# Patient Record
Sex: Female | Born: 1955 | Race: White | Hispanic: No | Marital: Married | Smoking: Never smoker
Health system: Southern US, Community
[De-identification: ages and names within clinical notes are randomized; demographics above are authoritative.]

## PROBLEM LIST (undated history)

## (undated) DIAGNOSIS — T7840XA Allergy, unspecified, initial encounter: Secondary | ICD-10-CM

## (undated) DIAGNOSIS — C801 Malignant (primary) neoplasm, unspecified: Secondary | ICD-10-CM

## (undated) DIAGNOSIS — Z5189 Encounter for other specified aftercare: Secondary | ICD-10-CM

## (undated) DIAGNOSIS — F319 Bipolar disorder, unspecified: Secondary | ICD-10-CM

## (undated) DIAGNOSIS — G2401 Drug induced subacute dyskinesia: Secondary | ICD-10-CM

## (undated) DIAGNOSIS — F329 Major depressive disorder, single episode, unspecified: Secondary | ICD-10-CM

## (undated) DIAGNOSIS — K219 Gastro-esophageal reflux disease without esophagitis: Secondary | ICD-10-CM

## (undated) DIAGNOSIS — K760 Fatty (change of) liver, not elsewhere classified: Secondary | ICD-10-CM

## (undated) DIAGNOSIS — I10 Essential (primary) hypertension: Secondary | ICD-10-CM

## (undated) DIAGNOSIS — E785 Hyperlipidemia, unspecified: Secondary | ICD-10-CM

## (undated) DIAGNOSIS — D126 Benign neoplasm of colon, unspecified: Secondary | ICD-10-CM

## (undated) DIAGNOSIS — R945 Abnormal results of liver function studies: Secondary | ICD-10-CM

## (undated) DIAGNOSIS — R7989 Other specified abnormal findings of blood chemistry: Secondary | ICD-10-CM

## (undated) DIAGNOSIS — R569 Unspecified convulsions: Secondary | ICD-10-CM

## (undated) DIAGNOSIS — F32A Depression, unspecified: Secondary | ICD-10-CM

## (undated) DIAGNOSIS — F419 Anxiety disorder, unspecified: Secondary | ICD-10-CM

## (undated) HISTORY — DX: Drug induced subacute dyskinesia: G24.01

## (undated) HISTORY — DX: Depression, unspecified: F32.A

## (undated) HISTORY — PX: EYE SURGERY: SHX253

## (undated) HISTORY — DX: Other specified abnormal findings of blood chemistry: R79.89

## (undated) HISTORY — DX: Major depressive disorder, single episode, unspecified: F32.9

## (undated) HISTORY — PX: KNEE ARTHROSCOPY WITH MENISCAL REPAIR: SHX5653

## (undated) HISTORY — PX: APPENDECTOMY: SHX54

## (undated) HISTORY — DX: Encounter for other specified aftercare: Z51.89

## (undated) HISTORY — DX: Unspecified convulsions: R56.9

## (undated) HISTORY — DX: Essential (primary) hypertension: I10

## (undated) HISTORY — DX: Malignant (primary) neoplasm, unspecified: C80.1

## (undated) HISTORY — DX: Fatty (change of) liver, not elsewhere classified: K76.0

## (undated) HISTORY — DX: Benign neoplasm of colon, unspecified: D12.6

## (undated) HISTORY — DX: Gastro-esophageal reflux disease without esophagitis: K21.9

## (undated) HISTORY — PX: DILATION AND CURETTAGE OF UTERUS: SHX78

## (undated) HISTORY — PX: NECK SURGERY: SHX720

## (undated) HISTORY — DX: Hyperlipidemia, unspecified: E78.5

## (undated) HISTORY — DX: Bipolar disorder, unspecified: F31.9

## (undated) HISTORY — PX: SPINE SURGERY: SHX786

## (undated) HISTORY — DX: Anxiety disorder, unspecified: F41.9

## (undated) HISTORY — DX: Allergy, unspecified, initial encounter: T78.40XA

## (undated) HISTORY — DX: Abnormal results of liver function studies: R94.5

---

## 1975-08-24 DIAGNOSIS — R569 Unspecified convulsions: Secondary | ICD-10-CM

## 1975-08-24 HISTORY — DX: Unspecified convulsions: R56.9

## 1997-12-27 ENCOUNTER — Other Ambulatory Visit: Admission: RE | Admit: 1997-12-27 | Discharge: 1997-12-27 | Payer: Self-pay | Admitting: Obstetrics and Gynecology

## 1998-10-29 ENCOUNTER — Encounter: Payer: Self-pay | Admitting: Obstetrics and Gynecology

## 1998-10-29 ENCOUNTER — Ambulatory Visit (HOSPITAL_COMMUNITY): Admission: RE | Admit: 1998-10-29 | Discharge: 1998-10-29 | Payer: Self-pay | Admitting: Obstetrics and Gynecology

## 1998-10-30 ENCOUNTER — Inpatient Hospital Stay (HOSPITAL_COMMUNITY): Admission: EM | Admit: 1998-10-30 | Discharge: 1998-10-31 | Payer: Self-pay | Admitting: Emergency Medicine

## 1998-11-10 ENCOUNTER — Encounter (HOSPITAL_COMMUNITY): Admission: RE | Admit: 1998-11-10 | Discharge: 1999-02-08 | Payer: Self-pay

## 1999-03-13 ENCOUNTER — Other Ambulatory Visit: Admission: RE | Admit: 1999-03-13 | Discharge: 1999-03-13 | Payer: Self-pay | Admitting: Obstetrics and Gynecology

## 1999-11-02 ENCOUNTER — Inpatient Hospital Stay (HOSPITAL_COMMUNITY): Admission: AD | Admit: 1999-11-02 | Discharge: 1999-11-09 | Payer: Self-pay

## 2000-04-28 ENCOUNTER — Other Ambulatory Visit: Admission: RE | Admit: 2000-04-28 | Discharge: 2000-04-28 | Payer: Self-pay | Admitting: Obstetrics and Gynecology

## 2000-09-29 ENCOUNTER — Inpatient Hospital Stay (HOSPITAL_COMMUNITY): Admission: EM | Admit: 2000-09-29 | Discharge: 2000-10-03 | Payer: Self-pay | Admitting: *Deleted

## 2001-09-21 ENCOUNTER — Ambulatory Visit (HOSPITAL_COMMUNITY): Admission: RE | Admit: 2001-09-21 | Discharge: 2001-09-21 | Payer: Self-pay | Admitting: Obstetrics and Gynecology

## 2001-09-21 ENCOUNTER — Encounter: Payer: Self-pay | Admitting: Obstetrics and Gynecology

## 2002-05-08 ENCOUNTER — Encounter: Payer: Self-pay | Admitting: Obstetrics and Gynecology

## 2002-05-08 ENCOUNTER — Encounter: Admission: RE | Admit: 2002-05-08 | Discharge: 2002-05-08 | Payer: Self-pay | Admitting: Obstetrics and Gynecology

## 2002-07-06 ENCOUNTER — Encounter: Payer: Self-pay | Admitting: Obstetrics and Gynecology

## 2002-07-06 ENCOUNTER — Encounter: Admission: RE | Admit: 2002-07-06 | Discharge: 2002-07-06 | Payer: Self-pay | Admitting: Obstetrics and Gynecology

## 2002-08-20 ENCOUNTER — Ambulatory Visit (HOSPITAL_COMMUNITY): Admission: RE | Admit: 2002-08-20 | Discharge: 2002-08-20 | Payer: Self-pay | Admitting: Obstetrics and Gynecology

## 2002-08-20 ENCOUNTER — Encounter: Payer: Self-pay | Admitting: Obstetrics and Gynecology

## 2002-10-15 ENCOUNTER — Inpatient Hospital Stay (HOSPITAL_COMMUNITY): Admission: EM | Admit: 2002-10-15 | Discharge: 2002-10-19 | Payer: Self-pay | Admitting: Psychiatry

## 2003-09-02 ENCOUNTER — Other Ambulatory Visit: Admission: RE | Admit: 2003-09-02 | Discharge: 2003-09-02 | Payer: Self-pay | Admitting: Obstetrics and Gynecology

## 2003-09-26 ENCOUNTER — Ambulatory Visit (HOSPITAL_COMMUNITY): Admission: RE | Admit: 2003-09-26 | Discharge: 2003-09-26 | Payer: Self-pay | Admitting: Obstetrics and Gynecology

## 2003-12-25 ENCOUNTER — Inpatient Hospital Stay (HOSPITAL_COMMUNITY): Admission: RE | Admit: 2003-12-25 | Discharge: 2004-01-02 | Payer: Self-pay | Admitting: Psychiatry

## 2005-08-02 ENCOUNTER — Inpatient Hospital Stay (HOSPITAL_COMMUNITY): Admission: RE | Admit: 2005-08-02 | Discharge: 2005-08-06 | Payer: Self-pay | Admitting: Psychiatry

## 2005-08-03 ENCOUNTER — Ambulatory Visit: Payer: Self-pay | Admitting: Psychiatry

## 2005-09-09 ENCOUNTER — Other Ambulatory Visit: Admission: RE | Admit: 2005-09-09 | Discharge: 2005-09-09 | Payer: Self-pay | Admitting: Obstetrics and Gynecology

## 2005-10-14 ENCOUNTER — Ambulatory Visit (HOSPITAL_COMMUNITY): Admission: RE | Admit: 2005-10-14 | Discharge: 2005-10-14 | Payer: Self-pay | Admitting: Obstetrics and Gynecology

## 2007-01-22 ENCOUNTER — Emergency Department (HOSPITAL_COMMUNITY): Admission: EM | Admit: 2007-01-22 | Discharge: 2007-01-22 | Payer: Self-pay | Admitting: Family Medicine

## 2007-04-27 ENCOUNTER — Emergency Department (HOSPITAL_COMMUNITY): Admission: EM | Admit: 2007-04-27 | Discharge: 2007-04-28 | Payer: Self-pay | Admitting: Emergency Medicine

## 2007-05-25 ENCOUNTER — Ambulatory Visit: Payer: Self-pay | Admitting: Internal Medicine

## 2007-05-26 ENCOUNTER — Other Ambulatory Visit: Admission: RE | Admit: 2007-05-26 | Discharge: 2007-05-26 | Payer: Self-pay | Admitting: Obstetrics and Gynecology

## 2007-06-06 ENCOUNTER — Encounter: Payer: Self-pay | Admitting: Internal Medicine

## 2007-06-06 ENCOUNTER — Ambulatory Visit: Payer: Self-pay | Admitting: Internal Medicine

## 2008-09-18 ENCOUNTER — Ambulatory Visit (HOSPITAL_COMMUNITY): Admission: RE | Admit: 2008-09-18 | Discharge: 2008-09-18 | Payer: Self-pay | Admitting: Obstetrics and Gynecology

## 2008-09-25 ENCOUNTER — Encounter: Admission: RE | Admit: 2008-09-25 | Discharge: 2008-09-25 | Payer: Self-pay | Admitting: Obstetrics and Gynecology

## 2009-01-10 ENCOUNTER — Emergency Department (HOSPITAL_COMMUNITY): Admission: EM | Admit: 2009-01-10 | Discharge: 2009-01-10 | Payer: Self-pay | Admitting: Family Medicine

## 2009-03-27 ENCOUNTER — Encounter: Admission: RE | Admit: 2009-03-27 | Discharge: 2009-03-27 | Payer: Self-pay | Admitting: Obstetrics and Gynecology

## 2009-09-14 ENCOUNTER — Emergency Department (HOSPITAL_COMMUNITY): Admission: EM | Admit: 2009-09-14 | Discharge: 2009-09-14 | Payer: Self-pay | Admitting: Emergency Medicine

## 2010-04-08 ENCOUNTER — Other Ambulatory Visit: Admission: RE | Admit: 2010-04-08 | Discharge: 2010-04-08 | Payer: Self-pay | Admitting: Obstetrics and Gynecology
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
## 2011-01-08 NOTE — H&P (Signed)
NAMEAUDRY, Theresa [name] NO.:  [id number]   MEDICAL RECORD NO.:  [id number]                   PATIENT TYPE:  IPS   LOCATION:  0304                                 FACILITY:  [facility]   PHYSICIAN:  Jeanice Lim, M.D.              DATE OF BIRTH:  1956/03/31   DATE OF ADMISSION:  12/25/2003  DATE OF DISCHARGE:                         PSYCHIATRIC ADMISSION ASSESSMENT   IDENTIFYING INFORMATION:  A 55 year old married white female, voluntarily  admitted on Dec 25, 2003.   HISTORY OF PRESENT ILLNESS:  The patient presents with a history of self  injury feelings.  The patient knew at some point in time that she would  hurt herself.  She reports that she has had a new change of medicines and  has been experiencing mood swings, with suicidal ideation.  She states her  sleep has been varied.  Her appetite has been satisfactory.  The patient  thought that she was just getting so tired that she just stated that she  would hurt herself.   PAST PSYCHIATRIC HISTORY:  Third hospitalization.  The patient was here  approximately 1 year ago where she also wanted to stop any self injury  happening.  She has a history of cutting and burning herself.  Last time was  in December of 2004.  She is an outpatient with Dr. Nolen Mu, has a  therapist [facility].   SOCIAL HISTORY:  She is a 55 year old married white female with 2 children.  Lives with her husband.  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   FAMILY HISTORY:  Unclear.   ALCOHOL DRUG HISTORY:  Nonsmoker, occasional alcohol use, does not think it  is a problem.  Denies any drug use.   PAST MEDICAL HISTORY:  Primary care Ashleyanne Hemmingway is [facility].  Medical problems are epilepsy.  She reports a seizure from years ago.   MEDICATIONS:  Has been on Depakote ER 750 mg at bedtime, Zyprexa 15 mg  q.h.s. and Xanax XR 1 mg in the morning.   DRUG ALLERGIES:  Rash and renal problems.   PHYSICAL  EXAMINATION:  Performed.  Vital signs 97.5, 70 heart rate, 18  respirations, blood pressure is 136/89, approximately 158 pounds, about 5  feet 5 inches.  This is a well-nourished female, in no acute distress.  Nonfocal neurological findings.   LABORATORY DATA:  CBC is within normal limits.  CMET is within normal  limits.  TSH is 2.154.  Valporic acid level is 64.4.   ADMISSION DIAGNOSES:   AXIS I:  1. Bipolar disorder.  2. Post-traumatic stress disorder.  3. Dissociative identity disorder per patient.   AXIS II:  Deferred.   AXIS III:  Epilepsy.   AXIS IV:  Other psychosocial problems.   AXIS V:  Current is 30, past year 61.   PLAN:  Admission for thoughts of self harm.  Contract for safety, check  every 15 minutes.  Stabilize mood and thinking.  We will resume her  medications, will check Depakote levels.  The patient to follow up with Dr.  Nolen Mu,  will consider family session with her husband.  The patient is to  attend groups and continue to be medication compliant.   TENTATIVE LENGTH OF CARE:  3-5 days.     Landry Corporal, N.P.                       Jeanice Lim, M.D.    JO/MEDQ  D:  12/28/2003  T:  12/28/2003  Job:  [id number]

## 2011-01-08 NOTE — Discharge Summary (Signed)
NAMEJACKQULYN, MENDEL NO.:  [id number]   MEDICAL RECORD NO.:  [id number]                   PATIENT TYPE:  IPS   LOCATION:  0304                                 FACILITY:  [facility]   PHYSICIAN:  Jeanice Lim, M.D.              DATE OF BIRTH:  09-12-1955   DATE OF ADMISSION:  12/25/2003  DATE OF DISCHARGE:  01/02/2004                                 DISCHARGE SUMMARY   IDENTIFYING DATA:  This is a 55 year old married Caucasian female  voluntarily admitted.  Presenting with a history of ruminating, feeling  unable to safely control urge to hurt self.  The patient had a history of  mood swings.  Had been on Celexa and Zyprexa and primarily focused on fear  that she would actually cut herself seriously.  Third hospitalization to  [facility].  Here one year ago.  History of cutting.  Had been  outpatient with Dr. Nolen Mu and Marissa Calamity, therapist.   MEDICAL PROBLEMS:  History of epilepsy.  Last seizure was years ago.   MEDICATIONS:  Depakote ER 750 mg q.h.s., Zyprexa 15 mg q.h.s. and Xanax 1 mg  XR q.a.m.   ALLERGIES:  LAMICTAL (caused rash and questionable renal problems).   PHYSICAL EXAMINATION:  Physical examination and neurological exam  essentially within normal limits.   LABORATORY DATA:  Within normal limits.   MENTAL STATUS EXAM:  Alert, middle-aged.  Cooperative.  Fair eye contact.  Speech clear.  Mood tired.  No suicidal and homicidal thoughts but thoughts  and urges to hurt self in some manner, including cutting.  Thought processes  goal directed.  No psychotic symptoms.  Hears her own voice.  Cognitively  intact.  Judgment and insight are fair.   ADMISSION DIAGNOSES:   AXIS I:  1. Bipolar disorder.  2. History of post-traumatic stress disorder.  3. Possible dissociative identity disorder.   AXIS II:  Deferred.   AXIS III:  Epilepsy.   AXIS IV:  Moderate (problems with primary support group, other psychosocial  issues).   AXIS V:  30/65.   HOSPITAL COURSE:  The patient was admitted and ordered routine p.r.n.  medications and underwent further monitoring.  Was encouraged to participate  in individual, group and milieu therapy.  Was monitored medically.  Dr.  Nolen Mu was contacted regarding treatment recommendations and the patient  was placed on safety checks.  She was initially labile with mood swings  prior to admission.  Felt strong urge to kill or cut herself with a positive  history of cutting.  Apparently Celexa may have increased mood swings and  she had responded to Zyprexa.  Depakote level was checked and Xanax was  optimized for acute anxiety.  The patient reported improved sleep and  positive response to medication changes, which he tolerated well.  Mood  became more stable.  Affect brighter.  More hopeful and gradual resolution  of  dangerous ideation, specifically thoughts of self-harm.  She seemed to be  situationally provoked.  However, learned to be able to cope with these  reactive periods in a more healthy manner.   CONDITION ON DISCHARGE:  She was discharged in improved condition.  Mood  more euthymic.  Affect brighter.  Increased judgment and insight and  healthier coping skills.  Given medication education.   DISCHARGE MEDICATIONS:  1. Depakote ER 250 mg, 3 q.h.s., 125 mg at 12 p.m.  2. Zyprexa 2.5 mg at 12 p.m. and 12.5 mg q.h.s.  3. Xanax 0.5 mg, 1 q.6h. p.r.n. anxiety and agitation.  4. Xanax XR 0.5 mg q.a.m. and q.h.s.   FOLLOW UP:  The patient was to follow up with Marissa Calamity and Valinda Hoar  on Tuesday, Jan 14, 2004 and Dr. Nolen Mu on Tuesday, January 28, 2004 at 10:30  a.m.   DISCHARGE DIAGNOSES:   AXIS I:  1. Bipolar disorder.  2. History of post-traumatic stress disorder.  3. Possible dissociative identity disorder.   AXIS II:  Deferred.   AXIS III:  Epilepsy.   AXIS IV:  Moderate (problems with primary support group, other psychosocial  issues).    AXIS V:  Global Assessment of Functioning on discharge 55.                                               Jeanice Lim, M.D.    JEM/MEDQ  D:  02/02/2004  T:  02/03/2004  Job:  [id number]

## 2011-04-13 ENCOUNTER — Other Ambulatory Visit: Payer: Self-pay | Admitting: Obstetrics and Gynecology

## 2011-04-13 ENCOUNTER — Other Ambulatory Visit (HOSPITAL_COMMUNITY)
Admission: RE | Admit: 2011-04-13 | Discharge: 2011-04-13 | Disposition: A | Discharge status: Home / Self Care | Payer: Medicare Other | Source: Ambulatory Visit | Attending: Obstetrics and Gynecology | Admitting: Obstetrics and Gynecology

## 2011-04-13 DIAGNOSIS — Z01419 Encounter for gynecological examination (general) (routine) without abnormal findings: Secondary | ICD-10-CM | POA: Insufficient documentation

## 2011-04-15 ENCOUNTER — Other Ambulatory Visit: Payer: Self-pay | Admitting: Obstetrics and Gynecology

## 2011-04-15 DIAGNOSIS — R921 Mammographic calcification found on diagnostic imaging of breast: Secondary | ICD-10-CM

## 2011-04-22 ENCOUNTER — Ambulatory Visit
Admission: RE | Admit: 2011-04-22 | Discharge: 2011-04-22 | Disposition: A | Discharge status: Home / Self Care | Payer: Medicare Other | Source: Ambulatory Visit | Attending: Obstetrics and Gynecology | Admitting: Obstetrics and Gynecology

## 2011-04-22 DIAGNOSIS — R921 Mammographic calcification found on diagnostic imaging of breast: Secondary | ICD-10-CM

## 2011-09-02 ENCOUNTER — Encounter: Payer: Self-pay | Admitting: Neurology

## 2011-10-15 ENCOUNTER — Encounter: Payer: Self-pay | Admitting: Neurology

## 2011-10-15 ENCOUNTER — Ambulatory Visit (INDEPENDENT_AMBULATORY_CARE_PROVIDER_SITE_OTHER): Payer: Medicare Other | Admitting: Neurology

## 2011-10-15 DIAGNOSIS — G40909 Epilepsy, unspecified, not intractable, without status epilepticus: Secondary | ICD-10-CM

## 2011-10-15 DIAGNOSIS — R569 Unspecified convulsions: Secondary | ICD-10-CM

## 2011-10-15 DIAGNOSIS — G40309 Generalized idiopathic epilepsy and epileptic syndromes, not intractable, without status epilepticus: Secondary | ICD-10-CM | POA: Insufficient documentation

## 2011-10-15 MED ORDER — LEVETIRACETAM 1000 MG PO TABS: 2000.0000 mg | ORAL_TABLET | Freq: Two times a day (BID) | ORAL | Status: DC

## 2011-10-15 NOTE — Patient Instructions (Signed)
Your EEG is scheduled for Wednesday, Feb. 27th at 1:00pm.  Please arrive to [facility], first floor admitting by 12:45pm. [phone].
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
## 2011-10-17 NOTE — Progress Notes (Signed)
Please pull chart for me!  Thanks JC

## 2011-10-18 NOTE — Progress Notes (Signed)
Please pull this chart- I am not sure if this is our patient

## 2011-10-18 NOTE — Progress Notes (Signed)
Please pull chart for me!  Thanks JC   

## 2011-10-20 ENCOUNTER — Ambulatory Visit (HOSPITAL_COMMUNITY)
Admission: RE | Admit: 2011-10-20 | Discharge: 2011-10-20 | Disposition: A | Discharge status: Home / Self Care | Payer: Medicare Other | Source: Ambulatory Visit | Attending: Neurology | Admitting: Neurology

## 2011-10-20 DIAGNOSIS — R569 Unspecified convulsions: Secondary | ICD-10-CM

## 2011-10-20 DIAGNOSIS — R9401 Abnormal electroencephalogram [EEG]: Secondary | ICD-10-CM | POA: Insufficient documentation

## 2011-10-24 NOTE — Procedures (Signed)
  This routine EEG was requested in this 56 year old woman with a history of idiopathic generalized epilepsy.  Medications include levetiracetam.  The EEG was done with the patient awake and drowsy.  During periods of maximal wakefulness, there was a moderately regulated moderately sustained 9 cycles per second posterior dominant rhythm that attenuates with eye opening and was symmetric.  This was superimposed upon some background activities composed mainly of frontally dominant beta activities that were symmetric.  Photic stimulation did not produce clear driving response.  Hyperventilation for 3 minutes resulted in short bursts lasting 4-5 seconds of a irregular lower frequency theta activity that were frontally dominant.  Within these theta activities, there did appear to be intermixed spike discharges that appeared to be maximal in the anterior head region.  These bursts of theta activities with subtle intermixed spikes also occurred during photic stimulation although they did not have a clear relationship to the photic stimulation.  There was no clear photic driving seen.  The patient became drowsy characterized by attenuation of the alpha rhythm and bursts of these slower irregular theta activities.  Some of these again appeared to have intermittent spike discharges.  CLINICAL INTERPRETATION:  This routine EEG done with the patient awake and drowsy is abnormal.  Irregular generalized theta discharges with small intermixed spikes are likely a form fruste of generalized spike and wave discharges.  They suggest a diagnosis of a generalized epilepsy.          ______________________________ Denton Meek, MD    [id number] D:  10/24/2011 21:50:22  T:  10/24/2011 22:27:33  Job #:  [id number]

## 2011-11-10 ENCOUNTER — Ambulatory Visit (INDEPENDENT_AMBULATORY_CARE_PROVIDER_SITE_OTHER): Payer: Medicare Other | Admitting: Emergency Medicine

## 2011-11-10 VITALS — BP 104/90 | HR 108 | Temp 98.0°F | Resp 18 | Ht 66.5 in | Wt 141.0 lb

## 2011-11-10 DIAGNOSIS — T300 Burn of unspecified body region, unspecified degree: Secondary | ICD-10-CM

## 2011-11-10 MED ORDER — SILVER SULFADIAZINE 1 % EX CREA: TOPICAL_CREAM | Freq: Every day | CUTANEOUS | Status: DC

## 2011-11-10 MED ORDER — DOXYCYCLINE HYCLATE 100 MG PO TABS: 100.0000 mg | ORAL_TABLET | Freq: Two times a day (BID) | ORAL | Status: AC

## 2011-11-10 NOTE — Patient Instructions (Signed)
Clean burn with soap and water twice a day. Apply Silvadene cream twice a day . Take doxycycline twice a day. Recheck in our office in one week.
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
## 2012-02-14 ENCOUNTER — Ambulatory Visit (INDEPENDENT_AMBULATORY_CARE_PROVIDER_SITE_OTHER): Payer: Medicare Other | Admitting: Neurology

## 2012-02-14 ENCOUNTER — Encounter: Payer: Self-pay | Admitting: Neurology

## 2012-02-14 VITALS — BP 126/80 | HR 88 | Wt 150.0 lb

## 2012-02-14 DIAGNOSIS — G40309 Generalized idiopathic epilepsy and epileptic syndromes, not intractable, without status epilepticus: Secondary | ICD-10-CM

## 2012-02-14 DIAGNOSIS — G40909 Epilepsy, unspecified, not intractable, without status epilepticus: Secondary | ICD-10-CM

## 2012-02-14 MED ORDER — SUMATRIPTAN SUCCINATE 100 MG PO TABS: ORAL_TABLET | ORAL | Status: DC

## 2012-02-14 NOTE — Progress Notes (Signed)
Dear Dr. Patsy Lager,  I saw  Theresa [name] back in [facility] clinic for her problem with idiopathic generalized epilepsy.  As you may recall, she is a 56 y.o. year old female with a history of bipolar disorder who has had generalized tonic clonic seizures since 1978.  She was maintained on Depakote for many years, until switching to Keppra about 2.5 years ago because of "liver problems".  She has seizures about 1 per year.  Her last was November 16,2012 which was blamed on a combination of olanzapine and Seroquel.  The seroquel was stopped.  Sleep deprivation seems to be a clear trigger which can be compounded by her manic episodes.  At her first visit, I increased her Keppra to 2000mg  bid.  She has not had any further seizures.  She does have a feeling of having had a seizure at night(unverified) - when she awakes tired in the a.m.  She has had two of these events over the last 4 months.  A routine EEG revealed bursts of diffuse theta activity with what appeared to be intermixed spikes.  Her sleep remains sufficient.  She does get migraine without aura 1-2 times per month.  She is using Excedrin migraine.  It sounds like she may have used a triptan successfully in the past, but had a difficulty time affording it.  Her bipolar remains well controlled.  Medical history, social history, and family history were reviewed and have not changed since the last clinic visit.  Current Outpatient Prescriptions on File Prior to Visit  Medication Sig Dispense Refill  . clonazePAM (KLONOPIN) 0.5 MG tablet Take 0.5 mg by mouth 2 (two) times daily as needed.      . levETIRAcetam (KEPPRA) 1000 MG tablet Take 2 tablets (2,000 mg total) by mouth 2 (two) times daily.  240 tablet  11  . mirtazapine (REMERON) 30 MG tablet Take 30 mg by mouth. 1 1/2 at bedtime      . OLANZapine (ZYPREXA) 5 MG tablet Take 5 mg by mouth at bedtime.      . temazepam (RESTORIL) 30 MG capsule Take 30 mg by mouth at bedtime as needed.       . SUMAtriptan (IMITREX) 100 MG tablet take 1 tablet as needed for headache, may repeat in 2 hours.  10 tablet  2    Allergies  Allergen Reactions  . Codeine   . Lamictal (Lamotrigine)   . Penicillins     ROS:  13 systems were reviewed and  are unremarkable.  Exam: . Filed Vitals:   02/14/12 1018  BP: 126/80  Pulse: 88  Weight: 150 lb (68.04 kg)    In general, well appearing women.  Mental status:   The patient is oriented to person, place and time. Recent and remote memory are intact. Attention span and concentration are normal. Language including repetition, naming, following commands are intact. Fund of knowledge of current and historical events, as well as vocabulary are normal. Flat affect.  Cranial Nerves: Pupils are equally round and reactive to light. Extraocular movements are intact without nystagmus. Muscles of facial expression are symmetric. Hearing intact to bilateral finger rub. Tongue protrusion, uvula, palate midline.  Shoulder shrug intact  Motor:  Normal bulk and tone, no drift.  Mild postural tremor bilaterally.   Coordination:  Normal finger to nose  Gait:  Normal gait and station.    Impression/Recommendations:  1.  Idiopathic generalized epilepsy - continue Keppra 2000mg  bid.  I am unsure as to the  nature of the nighttime spells of perception of seizure but I would not change her medication for this. 2.  Migraines - I have given her a prescription for Imitrex 100mg  prn  I will unfortunately not be able to see the patient back, unless she wants to see me at [facility].  However, this may be a problem given her transportation issues.  Lupita Raider Modesto Charon, MD [facility], [place]

## 2012-03-31 ENCOUNTER — Encounter: Payer: Self-pay | Admitting: Neurology

## 2012-03-31 ENCOUNTER — Ambulatory Visit (INDEPENDENT_AMBULATORY_CARE_PROVIDER_SITE_OTHER): Payer: Medicare Other | Admitting: Neurology

## 2012-03-31 ENCOUNTER — Telehealth: Payer: Self-pay | Admitting: Neurology

## 2012-03-31 VITALS — BP 110/72 | HR 96 | Wt 153.0 lb

## 2012-03-31 DIAGNOSIS — G40909 Epilepsy, unspecified, not intractable, without status epilepticus: Secondary | ICD-10-CM

## 2012-03-31 DIAGNOSIS — G40309 Generalized idiopathic epilepsy and epileptic syndromes, not intractable, without status epilepticus: Secondary | ICD-10-CM

## 2012-03-31 DIAGNOSIS — F319 Bipolar disorder, unspecified: Secondary | ICD-10-CM

## 2012-03-31 NOTE — Telephone Encounter (Signed)
Pt has list of meds to give to office per Dr. Nash Dimmer request. Pt does not have fax machine. Can we call her husband at his cell phone number and get the med list that way?

## 2012-03-31 NOTE — Progress Notes (Signed)
  Dear Dr. Patsy Lager,  I saw  Theresa [name] back in [facility] for her problem with idiopathic generalized epilepsy.  As you may recall, she is a 56 y.o. year old female with a history of bipolar disorder and IGE.  She has not had any seizures since I saw her last.  However, she is having continued difficulty with learning and cognition.  She had NP testing in October which demonstrated MCI.  Her husband says that after a generalized tonic clonic seizure (her last was in November 2012) she usually gets better.  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  Notably her olanzapine was apparently started around the same time as her last seizure.  Clonazepam 0.5 which she takes at night is also new.  She takes Xanax 1mg  bid -tid but this is chronic.  She also takes ropinirole.  Sometimes she awakens in the a.m. very tired(like she had a seizure).  She remains on the Keppra 2000 bid and has had no GTCS.    Medical history, social history, and family history were reviewed and have not changed since the last clinic visit. Current Outpatient Prescriptions on File Prior to Visit  Medication Sig Dispense Refill  . asenapine (SAPHRIS) 5 MG SUBL Place 10-20 mg under the tongue at bedtime.      . clonazePAM (KLONOPIN) 0.5 MG tablet Take 1 mg by mouth at bedtime as needed.       . levETIRAcetam (KEPPRA) 1000 MG tablet Take 2 tablets (2,000 mg total) by mouth 2 (two) times daily.  240 tablet  11  . ROPINIROLE HCL PO Take 1-2 mg by mouth at bedtime.       . SUMAtriptan (IMITREX) 100 MG tablet take 1 tablet as needed for headache, may repeat in 2 hours.  10 tablet  2  . DISCONTD: OLANZapine (ZYPREXA) 5 MG tablet Take 5 mg by mouth at bedtime.       - olanzapine 20mg  qhs  Allergies  Allergen Reactions  . Codeine   . Lamictal (Lamotrigine)   . Penicillins     ROS:  13 systems were reviewed and  are unremarkable.  Exam: . Filed Vitals:   03/31/12 0922  BP: 110/72  Pulse: 96  Weight: 153 lb (69.4 kg)      Impression/Recommendations:  1.  Cognitive changes - She is either having subclinical seizures, or this is medication toxicity.  I suspect the latter, and it may be related to the addition of the olanzapine or clonazepam.  I am going to arrange for a ambulatory 24 hour EEG to look for the density of generalized discharges to see if these are happening frequently, however, I think this is less likely given her routine EEG which did not show frequent discharges.  I have asked her to follow up with Dr. Nolen Mu about perhaps trying to change her olanzapine to see if it is making her cognition worse.   Lupita Raider Modesto Charon, MD [facility], [place]

## 2012-03-31 NOTE — Telephone Encounter (Signed)
Spoke with patient's husband. He gave me a list of his wife's meds over the phone.   1.) Zyprexa 20 mg one po q HS  2.) Ropinirole 1 mg 1-2 po q HS prn  3.) Keppra 1000 mg 2 po BID  4.) Klonopin 1.0 mg 1 q HS  5.) Alprazolam 1 mg 1 po qid prn  6.) Saphris (Asenatine) 10 mg  1 at HS; may repeat x 1 if needed.   **Dr. Modesto Charon, med list as you had requested.

## 2012-04-02 DIAGNOSIS — F319 Bipolar disorder, unspecified: Secondary | ICD-10-CM | POA: Insufficient documentation

## 2012-04-03 ENCOUNTER — Other Ambulatory Visit: Payer: Self-pay | Admitting: Neurology

## 2012-04-03 ENCOUNTER — Telehealth: Payer: Self-pay | Admitting: Neurology

## 2012-04-03 DIAGNOSIS — G40909 Epilepsy, unspecified, not intractable, without status epilepticus: Secondary | ICD-10-CM

## 2012-04-03 NOTE — Telephone Encounter (Signed)
Spoke with [place] at the [facility]. Order for the 24 hour ambulatory EEG faxed to her with confirmation received. She will call the patient to schedule.

## 2012-04-10 ENCOUNTER — Encounter: Payer: Self-pay | Admitting: *Deleted

## 2012-04-12 ENCOUNTER — Encounter: Payer: Self-pay | Admitting: Internal Medicine

## 2012-04-12 ENCOUNTER — Other Ambulatory Visit (HOSPITAL_COMMUNITY)
Admission: RE | Admit: 2012-04-12 | Discharge: 2012-04-12 | Disposition: A | Discharge status: Home / Self Care | Payer: Medicare Other | Source: Ambulatory Visit | Attending: Obstetrics and Gynecology | Admitting: Obstetrics and Gynecology

## 2012-04-12 ENCOUNTER — Other Ambulatory Visit: Payer: Self-pay | Admitting: Obstetrics and Gynecology

## 2012-04-12 DIAGNOSIS — Z01419 Encounter for gynecological examination (general) (routine) without abnormal findings: Secondary | ICD-10-CM | POA: Insufficient documentation

## 2012-05-03 ENCOUNTER — Other Ambulatory Visit: Payer: Self-pay | Admitting: Internal Medicine

## 2012-05-03 DIAGNOSIS — Z1211 Encounter for screening for malignant neoplasm of colon: Secondary | ICD-10-CM

## 2012-05-29 ENCOUNTER — Ambulatory Visit (AMBULATORY_SURGERY_CENTER): Payer: Medicare Other | Admitting: *Deleted

## 2012-05-29 VITALS — Ht 66.5 in | Wt 158.0 lb

## 2012-05-29 DIAGNOSIS — Z1211 Encounter for screening for malignant neoplasm of colon: Secondary | ICD-10-CM

## 2012-05-29 MED ORDER — MOVIPREP 100 G PO SOLR: ORAL | Status: DC

## 2012-06-09 ENCOUNTER — Encounter: Payer: Medicare Other | Admitting: Internal Medicine

## 2012-06-16 ENCOUNTER — Ambulatory Visit (HOSPITAL_COMMUNITY)
Admission: RE | Admit: 2012-06-16 | Discharge: 2012-06-16 | Disposition: A | Discharge status: Home / Self Care | Payer: Medicare Other | Source: Ambulatory Visit | Attending: Internal Medicine | Admitting: Internal Medicine

## 2012-06-16 ENCOUNTER — Encounter (HOSPITAL_COMMUNITY): Payer: Self-pay | Admitting: *Deleted

## 2012-06-16 ENCOUNTER — Encounter (HOSPITAL_COMMUNITY)
Admission: RE | Disposition: A | Discharge status: Home / Self Care | Payer: Self-pay | Source: Ambulatory Visit | Attending: Internal Medicine

## 2012-06-16 DIAGNOSIS — Z860100 Personal history of colon polyps, unspecified: Secondary | ICD-10-CM

## 2012-06-16 DIAGNOSIS — Z1211 Encounter for screening for malignant neoplasm of colon: Secondary | ICD-10-CM

## 2012-06-16 DIAGNOSIS — Z09 Encounter for follow-up examination after completed treatment for conditions other than malignant neoplasm: Secondary | ICD-10-CM | POA: Insufficient documentation

## 2012-06-16 HISTORY — PX: COLONOSCOPY: SHX5424

## 2012-06-16 SURGERY — COLONOSCOPY
Anesthesia: Moderate Sedation

## 2012-06-16 MED ORDER — DIPHENHYDRAMINE HCL 50 MG/ML IJ SOLN
INTRAMUSCULAR | Status: AC
  Filled 2012-06-16: qty 1

## 2012-06-16 MED ORDER — MIDAZOLAM HCL 5 MG/5ML IJ SOLN
INTRAMUSCULAR | Status: DC | PRN
  Administered 2012-06-16: 0.5 mg via INTRAVENOUS
  Administered 2012-06-16 (×2): 2 mg via INTRAVENOUS
  Administered 2012-06-16: 1 mg via INTRAVENOUS
  Administered 2012-06-16: 2 mg via INTRAVENOUS
  Administered 2012-06-16: 1.5 mg via INTRAVENOUS
  Administered 2012-06-16: 1 mg via INTRAVENOUS

## 2012-06-16 MED ORDER — FENTANYL CITRATE 0.05 MG/ML IJ SOLN
INTRAMUSCULAR | Status: DC | PRN
  Administered 2012-06-16 (×2): 25 ug via INTRAVENOUS
  Administered 2012-06-16: 10 ug via INTRAVENOUS
  Administered 2012-06-16: 15 ug via INTRAVENOUS
  Administered 2012-06-16 (×2): 25 ug via INTRAVENOUS

## 2012-06-16 MED ORDER — MIDAZOLAM HCL 10 MG/2ML IJ SOLN
INTRAMUSCULAR | Status: AC
  Filled 2012-06-16: qty 2

## 2012-06-16 MED ORDER — FENTANYL CITRATE 0.05 MG/ML IJ SOLN
INTRAMUSCULAR | Status: AC
  Filled 2012-06-16: qty 2

## 2012-06-16 MED ORDER — SODIUM CHLORIDE 0.9 % IV SOLN: INTRAVENOUS | Status: DC

## 2012-06-16 NOTE — H&P (Signed)
  Theresa [name] 07/07/1956 MRN [id number]        History of Present Illness:  This is a 56 yo WF  who is having recall colonoscopy, last exam 2008 showed adenomatous polyp   Past Medical History  Diagnosis Date  . Seizures   . Bipolar 1 disorder   . Depression   . Adenomatous colon polyp    Past Surgical History  Procedure Date  . Appendectomy   . Dilation and curettage of uterus     reports that she has never smoked. She has never used smokeless tobacco. She reports that she drinks alcohol. She reports that she does not use illicit drugs. family history includes Colon polyps in her mother.  There is no history of Colon cancer and Stomach cancer. Allergies  Allergen Reactions  . Lamictal (Lamotrigine)     Acute renal failure  . Penicillins     Abdominal pain  . Codeine Swelling and Rash        Review of Systems:  The remainder of the 10 point ROS is negative except as outlined in H&P   Physical Exam: General appearance  Well developed, in no distress. Eyes- non icteric. HEENT nontraumatic, normocephalic. Mouth no lesions, tongue papillated, no cheilosis. Neck supple without adenopathy, thyroid not enlarged, no carotid bruits, no JVD. Lungs Clear to auscultation bilaterally. Cor normal S1, normal S2, regular rhythm, no murmur,  quiet precordium. Abdomen: soft, non tender Rectal:not done Extremities no pedal edema. Skin no lesions. Neurological alert and oriented x 3. Psychological normal mood and affect.  Assessment and Plan: Pt scheduled for recall colonoscopy to follow up on an adenomatous polyp   06/16/2012 [name]

## 2012-06-16 NOTE — Interval H&P Note (Signed)
History and Physical Interval Note:  06/16/2012 10:07 AM  Theresa [name]  has presented today for surgery, with the diagnosis of screening  The various methods of treatment have been discussed with the patient and family. After consideration of risks, benefits and other options for treatment, the patient has consented to  Procedure(s) (LRB) with comments: COLONOSCOPY (N/A) as a surgical intervention .  The patient's history has been reviewed, patient examined, no change in status, stable for surgery.  I have reviewed the patient's chart and labs.  Questions were answered to the patient's satisfaction.     [name]

## 2012-06-16 NOTE — Op Note (Signed)
[facility] [address] [place], [address]   COLONOSCOPY PROCEDURE REPORT  PATIENT: Theresa [name], Theresa [name]  MR#: [id number] BIRTHDATE: 26-Mar-1956 , 56  yrs. old GENDER: Female ENDOSCOPIST: Hart Carwin, MD REFERRED BY:  Robert Bellow, M.D. PROCEDURE DATE:  06/16/2012 PROCEDURE:   Colonoscopy, surveillance ASA CLASS:   Class I INDICATIONS:patient's personal history of adenomatous colon polyps and last colon 2008- adenomatous polyp. MEDICATIONS: These medications were titrated to patient response per physician's verbal order, Versed 10 mg IV, and Fentanyl 125 mcg IV   DESCRIPTION OF PROCEDURE:   After the risks and benefits and of the procedure were explained, informed consent was obtained.  A digital rectal exam revealed no abnormalities of the rectum.    The Pentax Ped Colon [id number]  endoscope was introduced through the anus and advanced to the cecum, which was identified by both the appendix and ileocecal valve .  The quality of the prep was good, using MoviPrep .  The instrument was then slowly withdrawn as the colon was fully examined.     COLON FINDINGS: A normal appearing cecum, ileocecal valve, and appendiceal orifice were identified.  The ascending, hepatic flexure, transverse, splenic flexure, descending, sigmoid colon and rectum appeared unremarkable.  No polyps or cancers were seen. Retroflexed views revealed no abnormalities.     The scope was then withdrawn from the patient and the procedure completed.  COMPLICATIONS: There were no complications. ENDOSCOPIC IMPRESSION: Normal colon  RECOMMENDATIONS: High fiber diet   REPEAT EXAM: In 10 year(s)  for Colonoscopy.  cc:  _______________________________ eSignedHart Carwin, MD 06/16/2012 10:36 AM

## 2012-06-19 ENCOUNTER — Encounter (HOSPITAL_COMMUNITY): Payer: Self-pay | Admitting: Internal Medicine

## 2012-11-01 ENCOUNTER — Encounter: Payer: Self-pay | Admitting: Family Medicine

## 2013-02-07 ENCOUNTER — Ambulatory Visit (INDEPENDENT_AMBULATORY_CARE_PROVIDER_SITE_OTHER): Payer: Medicare Other | Admitting: Family Medicine

## 2013-02-07 VITALS — BP 103/86 | HR 80 | Temp 98.2°F | Resp 18 | Ht 67.0 in | Wt 161.0 lb

## 2013-02-07 DIAGNOSIS — Z0000 Encounter for general adult medical examination without abnormal findings: Secondary | ICD-10-CM

## 2013-02-07 LAB — POCT CBC
Granulocyte percent: 56.4 %G (ref 37–80)
HCT, POC: 48.1 % — AB (ref 37.7–47.9)
Hemoglobin: 15.8 g/dL (ref 12.2–16.2)
Lymph, poc: 2.1 (ref 0.6–3.4)
MCH, POC: 28.8 pg (ref 27–31.2)
MCHC: 32.8 g/dL (ref 31.8–35.4)
MCV: 87.7 fL (ref 80–97)
MID (cbc): 0.5 (ref 0–0.9)
MPV: 10.2 fL (ref 0–99.8)
POC Granulocyte: 3.4 (ref 2–6.9)
POC LYMPH PERCENT: 35.6 %L (ref 10–50)
POC MID %: 8 %M (ref 0–12)
Platelet Count, POC: 231 10*3/uL (ref 142–424)
RBC: 5.48 M/uL (ref 4.04–5.48)
RDW, POC: 13.9 %
WBC: 6 10*3/uL (ref 4.6–10.2)

## 2013-02-07 LAB — LIPID PANEL
Cholesterol: 222 mg/dL — ABNORMAL HIGH (ref 0–200)
HDL: 36 mg/dL — ABNORMAL LOW (ref >39)
LDL Cholesterol: 139 mg/dL — ABNORMAL HIGH (ref 0–99)
Total CHOL/HDL Ratio: 6.2 Ratio
Triglycerides: 235 mg/dL — ABNORMAL HIGH (ref <150)
VLDL: 47 mg/dL — ABNORMAL HIGH (ref 0–40)

## 2013-02-07 LAB — COMPREHENSIVE METABOLIC PANEL
ALT: 71 U/L — ABNORMAL HIGH (ref 0–35)
AST: 44 U/L — ABNORMAL HIGH (ref 0–37)
Albumin: 4.6 g/dL (ref 3.5–5.2)
Alkaline Phosphatase: 103 U/L (ref 39–117)
BUN: 9 mg/dL (ref 6–23)
CO2: 24 mEq/L (ref 19–32)
Calcium: 10.2 mg/dL (ref 8.4–10.5)
Chloride: 104 mEq/L (ref 96–112)
Creat: 0.94 mg/dL (ref 0.50–1.10)
Glucose, Bld: 107 mg/dL — ABNORMAL HIGH (ref 70–99)
Potassium: 4 mEq/L (ref 3.5–5.3)
Sodium: 140 mEq/L (ref 135–145)
Total Bilirubin: 0.5 mg/dL (ref 0.3–1.2)
Total Protein: 7.5 g/dL (ref 6.0–8.3)

## 2013-02-07 LAB — TSH: TSH: 1.414 u[IU]/mL (ref 0.350–4.500)

## 2013-02-07 LAB — POCT UA - MICROSCOPIC ONLY
Bacteria, U Microscopic: NEGATIVE
Casts, Ur, LPF, POC: NEGATIVE
Crystals, Ur, HPF, POC: NEGATIVE
Mucus, UA: NEGATIVE
RBC, urine, microscopic: NEGATIVE
WBC, Ur, HPF, POC: NEGATIVE
Yeast, UA: NEGATIVE

## 2013-02-07 NOTE — Progress Notes (Addendum)
[facility] [address], [place] [address] [phone]  Date:  02/07/2013   Name:  Theresa [name]   DOB:  Oct 07, 1955   MRN:  [id number]  PCP:  Abbe Amsterdam, MD    Chief Complaint: Annual Exam   History of Present Illness:  Theresa [name] is a 57 y.o. very pleasant female patient who presents with the following:  Here today for a CPE today.    History of seizures and bipolar disorder.  I have not seen her since we changed over to Epic in January of 2013.   She does seem to have a swallowing problem for the last several months, it will seem like things "go down he wrong pipe."   She will have to cough to clear her throat.  This seems to occur every day, but not all the time.   Sometimes pills will get stuck in her throat, but food does not get stuck.    She had a normal colonoscopy in the fall per Dr. Juanda Chance- told to follow- up in 10 years.    Her last mammogram was about one year ago- she is able to schedule this herself and will follow-up. She sees Dr. Richardson Dopp for her OBG.  Her last dexa was about one year ago.    She is fasting this morning.   She sees Dr. Modesto Charon for her seizures and Dr. Nolen Mu for psych, her therapist is Joseph Art.    She is due for an eye exam- last was done 2 years ago.    Her last tetanus shot was in the last 5 years.   Patient Active Problem List   Diagnosis Date Noted  . Special screening for malignant neoplasms, colon 06/16/2012  . Personal history of colonic polyps 06/16/2012  . Bipolar disorder 04/02/2012  . 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 Seizures   . Bipolar 1 disorder   . Depression   . Adenomatous colon polyp   . Blood transfusion without reported diagnosis     Past Surgical History  Procedure Laterality Date  . Appendectomy    . Dilation and curettage of uterus    . Colonoscopy  06/16/2012    Procedure: COLONOSCOPY;  Surgeon: Hart Carwin, MD;   Location: [facility];  Service: Endoscopy;  Laterality: N/A;    History  Substance Use Topics  . Smoking status: Never Smoker   . Smokeless tobacco: Never Used  . Alcohol Use: Yes     Comment: occasionaly    Family History  Problem Relation Age of Onset  . Colon polyps Mother   . Cancer Mother   . Colon cancer Neg Hx   . Stomach cancer Neg Hx     Allergies  Allergen Reactions  . Lamictal (Lamotrigine)     Acute renal failure  . Penicillins     Abdominal pain  . Codeine Swelling and Rash    Medication list has been reviewed and updated.  Current Outpatient Prescriptions on File Prior to Visit  Medication Sig Dispense Refill  . ALPRAZolam (XANAX) 1 MG tablet Take 1 mg by mouth 4 (four) times daily as needed.      Marland Kitchen asenapine (SAPHRIS) 5 MG SUBL Place 10-20 mg under the tongue at bedtime.      . Cholecalciferol (VITAMIN D PO) Take by mouth. Takes 6000 mg daily      . clonazePAM (KLONOPIN) 0.5 MG tablet Take 1 mg  by mouth at bedtime as needed.       . levETIRAcetam (KEPPRA) 1000 MG tablet Take 2 tablets (2,000 mg total) by mouth 2 (two) times daily.  240 tablet  11  . Multiple Vitamin (MULTIVITAMIN) tablet Take 1 tablet by mouth daily.      Marland Kitchen OLANZapine (ZYPREXA) 20 MG tablet Take 20 mg by mouth at bedtime.      . Omega-3 Fatty Acids (FISH OIL PO) Take by mouth daily.      Marland Kitchen ROPINIROLE HCL PO Take 1-2 mg by mouth at bedtime.       . SUMAtriptan (IMITREX) 100 MG tablet take 1 tablet as needed for headache, may repeat in 2 hours.  10 tablet  2   No current facility-administered medications on file prior to visit.    Review of Systems:  As per HPI- otherwise negative.   Physical Examination: Filed Vitals:   02/07/13 1003  BP: 103/86  Pulse: 80  Temp: 98.2 F (36.8 C)  Resp: 18   Filed Vitals:   02/07/13 1003  Height: 5\' 7"  (1.702 m)  Weight: 161 lb (73.029 kg)   Body mass index is 25.21 kg/(m^2). Ideal Body Weight: Weight in (lb) to have BMI = 25:  159.3  GEN: WDWN, NAD, Non-toxic, A & O x 3, looks well HEENT: Atraumatic, Normocephalic. Neck supple. No masses, No LAD.  Bilateral TM wnl, oropharynx normal.  PEERL,EOMI.   Ears and Nose: No external deformity. CV: RRR, No M/G/R. No JVD. No thrill. No extra heart sounds. PULM: CTA B, no wheezes, crackles, rhonchi. No retractions. No resp. distress. No accessory muscle use. ABD: S, NT, ND, +BS. No rebound. No HSM. EXTR: No c/c/e.  Feet are negative NEURO Normal gait.  PSYCH: Normally interactive. Conversant. Not depressed or anxious appearing.  Calm demeanor.   Results for orders placed in visit on 02/07/13  POCT CBC      Result Value Range   WBC 6.0  4.6 - 10.2 K/uL   Lymph, poc 2.1  0.6 - 3.4   POC LYMPH PERCENT 35.6  10 - 50 %L   MID (cbc) 0.5  0 - 0.9   POC MID % 8.0  0 - 12 %M   POC Granulocyte 3.4  2 - 6.9   Granulocyte percent 56.4  37 - 80 %G   RBC 5.48  4.04 - 5.48 M/uL   Hemoglobin 15.8  12.2 - 16.2 g/dL   HCT, POC 16.1 (*) 09.6 - 47.9 %   MCV 87.7  80 - 97 fL   MCH, POC 28.8  27 - 31.2 pg   MCHC 32.8  31.8 - 35.4 g/dL   RDW, POC 04.5     Platelet Count, POC 231  142 - 424 K/uL   MPV 10.2  0 - 99.8 fL  POCT UA - MICROSCOPIC ONLY      Result Value Range   WBC, Ur, HPF, POC neg     RBC, urine, microscopic neg     Bacteria, U Microscopic neg     Mucus, UA neg     Epithelial cells, urine per micros 0-1     Crystals, Ur, HPF, POC neg     Casts, Ur, LPF, POC neg     Yeast, UA neg      Assessment and Plan: Physical exam, annual - Plan: POCT CBC, Comprehensive metabolic panel, Lipid panel, POCT UA - Microscopic Only, POCT urinalysis dipstick, TSH  CPE for age- await labs as above.  She will call Dr. Juanda Chance and arrange for a follow- up appt, may need an upper GI.   Will plan further follow- up pending labs.   Signed Abbe Amsterdam, MD  Addnd: 6/20- received her lab results.  Her lipids are up, and her blood sugar is borderline.  LMOM- I will send her a letter,  but it would be good for [place] to speak on the phone about these issues.   If she can, call me back today.  She called and we discussed her labs.  We need her to work on losing about 10 lbs, and on getting more exercise/ improving her diet.  These measures may help with preventing the development of pre- diabetes, and with bringing down her cholesterol.  She has been told that her LFTs were a little bit high in the past, but she thought this was due to her depakote which she has now stopped.  We will plan to recheck her labs in about 2 months- if not better will need to consider starting a statin for her.    Received LFT's from [facility] neuro- 05/2011 AST 40, ALT 53.

## 2013-02-07 NOTE — Patient Instructions (Addendum)
Please call Dr. Regino Schultze office about your swallowing issues:  [facility] offers appointments at the [facility]. location in [place], [place].  If you are a new patient, please call [phone] to make an appointment. Our office hours are from 8:00 a.m. to 5:00 p.m., Monday-Friday.  To schedule an appointment at the [facility], please call [phone]. The [facility] is open from 7:30 a.m. - 6:00 p.m., Monday-Friday.  If any problems give me a call Remember to have your eye exam soon I will be in touch with your labs  Please ask Dr. Modesto Charon about the Tdap shot the next time you see him- make sure it is ok for you to hae this when your next tetanus is due.

## 2013-02-09 ENCOUNTER — Encounter: Payer: Self-pay | Admitting: Family Medicine

## 2013-03-02 ENCOUNTER — Encounter: Payer: Medicare Other | Admitting: Family Medicine

## 2013-03-05 ENCOUNTER — Other Ambulatory Visit: Payer: Self-pay | Admitting: Neurology

## 2013-03-13 ENCOUNTER — Other Ambulatory Visit (HOSPITAL_COMMUNITY): Payer: Self-pay | Admitting: Obstetrics and Gynecology

## 2013-03-13 DIAGNOSIS — Z1231 Encounter for screening mammogram for malignant neoplasm of breast: Secondary | ICD-10-CM

## 2013-03-29 ENCOUNTER — Ambulatory Visit (HOSPITAL_COMMUNITY)
Admission: RE | Admit: 2013-03-29 | Discharge: 2013-03-29 | Disposition: A | Discharge status: Home / Self Care | Payer: Medicare Other | Source: Ambulatory Visit | Attending: Obstetrics and Gynecology | Admitting: Obstetrics and Gynecology

## 2013-03-29 DIAGNOSIS — Z1231 Encounter for screening mammogram for malignant neoplasm of breast: Secondary | ICD-10-CM | POA: Insufficient documentation

## 2013-04-09 ENCOUNTER — Ambulatory Visit (INDEPENDENT_AMBULATORY_CARE_PROVIDER_SITE_OTHER): Payer: Medicare Other | Admitting: Family Medicine

## 2013-04-09 ENCOUNTER — Other Ambulatory Visit: Payer: Self-pay | Admitting: Family Medicine

## 2013-04-09 VITALS — BP 122/78 | HR 83 | Temp 98.6°F | Resp 18 | Ht 67.0 in | Wt 155.0 lb

## 2013-04-09 DIAGNOSIS — R7309 Other abnormal glucose: Secondary | ICD-10-CM

## 2013-04-09 DIAGNOSIS — E781 Pure hyperglyceridemia: Secondary | ICD-10-CM

## 2013-04-09 DIAGNOSIS — R7989 Other specified abnormal findings of blood chemistry: Secondary | ICD-10-CM

## 2013-04-09 DIAGNOSIS — R899 Unspecified abnormal finding in specimens from other organs, systems and tissues: Secondary | ICD-10-CM

## 2013-04-09 DIAGNOSIS — E7800 Pure hypercholesterolemia, unspecified: Secondary | ICD-10-CM

## 2013-04-09 LAB — COMPREHENSIVE METABOLIC PANEL
ALT: 64 U/L — ABNORMAL HIGH (ref 0–35)
AST: 41 U/L — ABNORMAL HIGH (ref 0–37)
Albumin: 4.6 g/dL (ref 3.5–5.2)
Alkaline Phosphatase: 110 U/L (ref 39–117)
BUN: 10 mg/dL (ref 6–23)
CO2: 26 mEq/L (ref 19–32)
Calcium: 9.7 mg/dL (ref 8.4–10.5)
Chloride: 107 mEq/L (ref 96–112)
Creat: 1 mg/dL (ref 0.50–1.10)
Glucose, Bld: 105 mg/dL — ABNORMAL HIGH (ref 70–99)
Potassium: 4.2 mEq/L (ref 3.5–5.3)
Sodium: 140 mEq/L (ref 135–145)
Total Bilirubin: 0.4 mg/dL (ref 0.3–1.2)
Total Protein: 6.9 g/dL (ref 6.0–8.3)

## 2013-04-09 LAB — LIPID PANEL
Cholesterol: 211 mg/dL — ABNORMAL HIGH (ref 0–200)
HDL: 46 mg/dL (ref >39)
LDL Cholesterol: 143 mg/dL — ABNORMAL HIGH (ref 0–99)
Total CHOL/HDL Ratio: 4.6 Ratio
Triglycerides: 110 mg/dL (ref <150)
VLDL: 22 mg/dL (ref 0–40)

## 2013-04-09 LAB — POCT GLYCOSYLATED HEMOGLOBIN (HGB A1C): Hemoglobin A1C: 5.4

## 2013-04-09 NOTE — Progress Notes (Addendum)
[facility] [address] [place] [address] [phone]  Date:  04/09/2013   Name:  Theresa [name]   DOB:  1955/10/26   MRN:  [id number]  PCP:  Abbe Amsterdam, MD    Chief Complaint: Follow-up   History of Present Illness:  Theresa [name] is a 57 y.o. very pleasant female patient who presents with the following:  She is here fasting today- would like to recheck her glucose and choleserol, as well as LFTs.  She had a CPE 2 months ago which showed borderline glucose, elevated LFT's and high cholesterol.  She has lost 6 lbs since our last visit.    Here today with her husband.  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    Patient Active Problem List   Diagnosis Date Noted  . Special screening for malignant neoplasms, colon 06/16/2012  . Personal history of colonic polyps 06/16/2012  . Bipolar disorder 04/02/2012  . 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 Seizures   . Bipolar 1 disorder   . Depression   . Adenomatous colon polyp   . Blood transfusion without reported diagnosis     Past Surgical History  Procedure Laterality Date  . Appendectomy    . Dilation and curettage of uterus    . Colonoscopy  06/16/2012    Procedure: COLONOSCOPY;  Surgeon: Hart Carwin, MD;  Location: [facility];  Service: Endoscopy;  Laterality: N/A;    History  Substance Use Topics  . Smoking status: Never Smoker   . Smokeless tobacco: Never Used  . Alcohol Use: Yes     Comment: occasionaly    Family History  Problem Relation Age of Onset  . Colon polyps Mother   . Cancer Mother   . Colon cancer Neg Hx   . Stomach cancer Neg Hx     Allergies  Allergen Reactions  . Lamictal [Lamotrigine]     Acute renal failure  . Penicillins     Abdominal pain  . Codeine Swelling and Rash    Medication list has been reviewed and updated.  Current Outpatient Prescriptions on File Prior to  Visit  Medication Sig Dispense Refill  . ALPRAZolam (XANAX) 1 MG tablet Take 1 mg by mouth 4 (four) times daily as needed.      Marland Kitchen asenapine (SAPHRIS) 5 MG SUBL Place 10-20 mg under the tongue at bedtime.      . Cholecalciferol (VITAMIN D PO) Take by mouth. Takes 6000 mg daily      . clonazePAM (KLONOPIN) 0.5 MG tablet Take 1 mg by mouth at bedtime as needed.       . levETIRAcetam (KEPPRA) 1000 MG tablet Take 2 tablets (2,000 mg total) by mouth 2 (two) times daily.  240 tablet  11  . Multiple Vitamin (MULTIVITAMIN) tablet Take 1 tablet by mouth daily.      Marland Kitchen OLANZapine (ZYPREXA) 20 MG tablet Take 20 mg by mouth at bedtime.      . Omega-3 Fatty Acids (FISH OIL PO) Take by mouth daily.      Marland Kitchen ROPINIROLE HCL PO Take 1-2 mg by mouth at bedtime.       . SUMAtriptan (IMITREX) 100 MG tablet take 1 tablet as needed for headache, may repeat in 2 hours.  10 tablet  2  . benztropine (COGENTIN) 1 MG tablet Take 1 mg by mouth 2 (two) times daily.  No current facility-administered medications on file prior to visit.    Review of Systems:  As per HPI- otherwise negative.   Physical Examination: Filed Vitals:   04/09/13 1007  BP: 122/78  Pulse: 83  Temp: 98.6 F (37 C)  Resp: 18   Filed Vitals:   04/09/13 1007  Height: 5\' 7"  (1.702 m)  Weight: 155 lb (70.308 kg)   Body mass index is 24.27 kg/(m^2). Ideal Body Weight: Weight in (lb) to have BMI = 25: 159.3  GEN: WDWN, NAD, Non-toxic, A & O x 3, looks well HEENT: Atraumatic, Normocephalic. Neck supple. No masses, No LAD. Ears and Nose: No external deformity. CV: RRR, No M/G/R. No JVD. No thrill. No extra heart sounds. PULM: CTA B, no wheezes, crackles, rhonchi. No retractions. No resp. distress. No accessory muscle use. ABD: S, NT, ND. No rebound. No HSM. EXTR: No c/c/e NEURO Normal gait.  PSYCH: Normally interactive. Conversant. Not depressed or anxious appearing.  Calm demeanor.   Results for orders placed in visit on 04/09/13   POCT GLYCOSYLATED HEMOGLOBIN (HGB A1C)      Result Value Range   Hemoglobin A1C 5.4      Assessment and Plan: Elevated glucose - Plan: POCT glycosylated hemoglobin (Hb A1C)  High cholesterol - Plan: Comprehensive metabolic panel, Lipid panel  A1c is normal.  Will plan further follow- up pending labs. See patient instructions for more details.    Signed Abbe Amsterdam, MD  8/20 addnd- called regarding labs.  Triglycerides and HDL are much improved.  LFT's are better, although still slightly high.  Will try to add on hepatitis screening.  Discussed hepatic ultrasound vs recheck in 2 or 3 months.  She would prefer to do a recheck.

## 2013-04-09 NOTE — Patient Instructions (Addendum)
Good to see you again today!  Your blood pressure looks good.  I will be in touch with the rest of your labs when they come in.  Please consider signing up for mychart- it is an easier way to communicate and you can look at your labs on the computer any time.    Your A1c test is normal- this means that you do not have diabetes.  We will continue to keep an eye on your blood sugar

## 2013-04-11 ENCOUNTER — Encounter: Payer: Self-pay | Admitting: Family Medicine

## 2013-04-12 ENCOUNTER — Other Ambulatory Visit: Payer: Self-pay | Admitting: Obstetrics and Gynecology

## 2013-04-12 ENCOUNTER — Other Ambulatory Visit (HOSPITAL_COMMUNITY)
Admission: RE | Admit: 2013-04-12 | Discharge: 2013-04-12 | Disposition: A | Discharge status: Home / Self Care | Payer: Medicare Other | Source: Ambulatory Visit | Attending: Obstetrics and Gynecology | Admitting: Obstetrics and Gynecology

## 2013-04-12 DIAGNOSIS — R87619 Unspecified abnormal cytological findings in specimens from cervix uteri: Secondary | ICD-10-CM | POA: Insufficient documentation

## 2013-04-12 DIAGNOSIS — R87810 Cervical high risk human papillomavirus (HPV) DNA test positive: Secondary | ICD-10-CM | POA: Insufficient documentation

## 2013-04-12 DIAGNOSIS — Z124 Encounter for screening for malignant neoplasm of cervix: Secondary | ICD-10-CM | POA: Insufficient documentation

## 2013-04-12 LAB — HEPATITIS PANEL, ACUTE
HCV Ab: NEGATIVE
Hep A IgM: NEGATIVE
Hep B C IgM: NEGATIVE
Hepatitis B Surface Ag: NEGATIVE

## 2013-04-19 NOTE — Addendum Note (Signed)
Addended by: Abbe Amsterdam C on: 04/19/2013 02:04 PM   Modules accepted: Orders

## 2013-04-19 NOTE — Progress Notes (Signed)
Called and let her know that her hepatitis panel was negative, as expected.  She will come in for a repeat LFT panel in 6- 8 weeks.   She also mentioned that she had been on depakote for about 10-years, but this was stopped a year ago due to liver problems.

## 2013-05-01 ENCOUNTER — Telehealth: Payer: Self-pay | Admitting: Family Medicine

## 2013-05-01 DIAGNOSIS — R748 Abnormal levels of other serum enzymes: Secondary | ICD-10-CM

## 2013-05-01 NOTE — Telephone Encounter (Signed)
Called and discussed with her.  I have pulled her old paper chart and noted that her LFTs have been mildly elevated for a couple of years.  We will do an abd ultrasound.  She is ok with this plan

## 2013-05-07 ENCOUNTER — Ambulatory Visit
Admission: RE | Admit: 2013-05-07 | Discharge: 2013-05-07 | Disposition: A | Discharge status: Home / Self Care | Payer: Medicare Other | Source: Ambulatory Visit | Attending: Family Medicine | Admitting: Family Medicine

## 2013-05-07 DIAGNOSIS — K760 Fatty (change of) liver, not elsewhere classified: Secondary | ICD-10-CM

## 2013-05-07 DIAGNOSIS — R748 Abnormal levels of other serum enzymes: Secondary | ICD-10-CM

## 2013-05-07 HISTORY — DX: Fatty (change of) liver, not elsewhere classified: K76.0

## 2013-05-10 ENCOUNTER — Telehealth: Payer: Self-pay | Admitting: Family Medicine

## 2013-05-10 DIAGNOSIS — R7989 Other specified abnormal findings of blood chemistry: Secondary | ICD-10-CM

## 2013-05-10 NOTE — Telephone Encounter (Signed)
Called and discussed with pt.  Her ultrasound showed fatty liver, and 2 small non- specific areas which might be better evaluated by MRI.  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  Unsure if a bx would be indicated, or if MRI is needed. We would like to get a GI opinion.  Theresa [name] has seen Dr. Juanda Chance for her colonoscopies and would like to see her but understands that she is in high demand and would also be happy seeing one of her partners.

## 2013-05-25 ENCOUNTER — Encounter: Payer: Self-pay | Admitting: Internal Medicine

## 2013-05-28 ENCOUNTER — Encounter: Payer: Self-pay | Admitting: *Deleted

## 2013-06-28 ENCOUNTER — Other Ambulatory Visit: Payer: Self-pay

## 2013-07-31 ENCOUNTER — Encounter: Payer: Self-pay | Admitting: Internal Medicine

## 2013-07-31 ENCOUNTER — Ambulatory Visit (INDEPENDENT_AMBULATORY_CARE_PROVIDER_SITE_OTHER): Payer: Medicare Other | Admitting: Internal Medicine

## 2013-07-31 ENCOUNTER — Other Ambulatory Visit (INDEPENDENT_AMBULATORY_CARE_PROVIDER_SITE_OTHER): Payer: Medicare Other

## 2013-07-31 VITALS — BP 114/80 | HR 78 | Ht 66.0 in | Wt 156.4 lb

## 2013-07-31 DIAGNOSIS — R7989 Other specified abnormal findings of blood chemistry: Secondary | ICD-10-CM

## 2013-07-31 LAB — HEPATIC FUNCTION PANEL
ALT: 74 U/L — ABNORMAL HIGH (ref 0–35)
AST: 46 U/L — ABNORMAL HIGH (ref 0–37)
Albumin: 4.3 g/dL (ref 3.5–5.2)
Alkaline Phosphatase: 97 U/L (ref 39–117)
Bilirubin, Direct: 0.1 mg/dL (ref 0.0–0.3)
Total Bilirubin: 0.4 mg/dL (ref 0.3–1.2)
Total Protein: 7.6 g/dL (ref 6.0–8.3)

## 2013-07-31 LAB — FERRITIN: Ferritin: 61.3 ng/mL (ref 10.0–291.0)

## 2013-07-31 NOTE — Progress Notes (Signed)
Theresa [name] Mar 22, 1956 [id number]   History of Present Illness:  This is a 57 year old white female with mild elevation of transaminase. In June 2014, her AST was 44 and ALT was 71. On 04/09/2013, her AST was 41 and ALT was 64 with an albumin of 4.6 and alkaline phosphatase of 110. Her bilirubin was normal. Hepatitis serologies A,B and C were all negative. She has a history of bipolar disorder, post traumatic stress syndrome, and idiopathic generalized epilepsy. Her last seizure was in November 2012. She has been on anticonvulsive medications. She was initially on Depakote from 2003 to 2011 and it was discontinued because of liver abnormalities- we don't have the documentation. She was started on Keppra 1000 mg 2 tablets twice a day. She had a blood transfusion in 1981 during miscarriage. There was no family history of cirrhosis. She does not drink any alcohol. She denies easy bruising or swelling. She has  Memory deficits, likely related to her medications. An upper abdominal ultrasound in September 2014 showed hepatic steatosis and 2 hypoechoic lesions; 1x0.9x0.9 cm and another one 1x0.8x1 cm too small to characterize, likely cystic. She denies pruritus but admits to easy fatigue. She has to take a nap during the day.  Past Medical History  Diagnosis Date  . Seizures   . Bipolar 1 disorder   . Depression   . Adenomatous colon polyp   . Blood transfusion without reported diagnosis   . Elevated LFTs   . Hyperlipidemia   . Hepatic steatosis 05/07/13    Past Surgical History  Procedure Laterality Date  . Appendectomy    . Dilation and curettage of uterus    . Colonoscopy  06/16/2012    Procedure: COLONOSCOPY;  Surgeon: Hart Carwin, MD;  Location: [facility];  Service: Endoscopy;  Laterality: N/A;    Allergies  Allergen Reactions  . Lamictal [Lamotrigine]     Acute renal failure  . Penicillins     Abdominal pain  . Codeine Swelling and Rash    Review of Systems:   The  remainder of the 10 point ROS is negative except as outlined in the H&P  Physical Exam: General Appearance Well developed, in no distress; scars on her forearms and lower lip Eyes  Non icteric  HEENT  Non traumatic, normocephalic  Mouth No lesion, tongue papillated, no cheilosis Neck Supple without adenopathy, thyroid not enlarged, no carotid bruits, no JVD Lungs Clear to auscultation bilaterally COR Normal S1, normal S2, regular rhythm, no murmur, quiet precordium Abdomen Soft, nontender with normoactive bowel sounds liver edge 2 cm below right costal margin. Nontender. No ascites. Splenic tip not enlarged. Normal active bowel sounds Rectal not done Extremities  No pedal edema Skin No lesions, Dupyytren's Contractures Neurological Alert and oriented x 3 no asterixis Psychological Normal mood and affect  Assessment and Plan:   57 year old white female with minor abnormalities of liver function test which are most likely related to Keppra. She had abnormalities of her liver tests while taking Depakote in the past until it was discontinued. Unfortunately I have not seen ane LFT's from that time. Her liver appears normal on ultrasound, and slightly enlarged on my exam There is no evidence of portal hypertension.. On ultrasound, there were 2 small lesions in the liver which appeared to be benign and need to be rechecked in 1 year for stability There was no solid space occupying lesion in the liver. Her synthetic liver function seems to be normal but we will have to check a  few things such as prothrombin time and I would also rule out other liver diseases by checking ceruloplasmin, alpha 1 anti-trypsin, alpha-fetoprotein, ferritin, ANA, AMA, IgG and IgM. We will repeat the upper abdominal ultrasound in 1 year to follow up on the lesions. The abnormalities of transaminases are not severe enough to warrant  stopping  Keppra but may need to be rechecked every 3 months for t to rule out progression. I  assured the patient and her husband that her liver function is satisfactorily and there is no evidence of cirrhosis. They were relieved.    Theresa [name] 07/31/2013

## 2013-07-31 NOTE — Patient Instructions (Addendum)
Your physician has requested that you go to the basement for the following lab work before leaving today: PT, Hepatic Function, AFP, Alpha 1 antitrypsin, Ferritin, AMA, ANA, IgM, IgG, Ceruloplasmin  You will need a follow up ultrasound of your liver in 1 year. We will call you when it gets closer to that time to schedule that appointment. REPEAT LFT'S Q 3 MONTHS X 1 YEAR. CC: Dr Shanda Bumps Copland

## 2013-08-01 ENCOUNTER — Ambulatory Visit: Payer: Medicare Other

## 2013-08-01 DIAGNOSIS — R7989 Other specified abnormal findings of blood chemistry: Secondary | ICD-10-CM

## 2013-08-01 LAB — ALPHA-1-ANTITRYPSIN: A-1 Antitrypsin, Ser: 123 mg/dL (ref 90–200)

## 2013-08-01 LAB — CERULOPLASMIN: Ceruloplasmin: 28 mg/dL (ref 20–60)

## 2013-08-01 LAB — IGM: IgM, Serum: 38 mg/dL — ABNORMAL LOW (ref 52–322)

## 2013-08-01 LAB — AFP TUMOR MARKER: AFP-Tumor Marker: 7.8 ng/mL (ref 0.0–8.0)

## 2013-08-01 LAB — ANA: Anti Nuclear Antibody(ANA): NEGATIVE

## 2013-08-01 LAB — IGG: IgG (Immunoglobin G), Serum: 963 mg/dL (ref 690–1700)

## 2013-08-02 LAB — PROTIME-INR
INR: 0.98 (ref <1.50)
Prothrombin Time: 13 seconds (ref 11.6–15.2)

## 2013-08-02 LAB — MITOCHONDRIAL ANTIBODIES: Mitochondrial M2 Ab, IgG: 0.57 (ref <0.91)

## 2013-08-03 ENCOUNTER — Other Ambulatory Visit: Payer: Self-pay | Admitting: *Deleted

## 2013-08-03 DIAGNOSIS — R7989 Other specified abnormal findings of blood chemistry: Secondary | ICD-10-CM

## 2013-08-28 ENCOUNTER — Emergency Department (INDEPENDENT_AMBULATORY_CARE_PROVIDER_SITE_OTHER): Payer: Medicare Other

## 2013-08-28 ENCOUNTER — Encounter (HOSPITAL_COMMUNITY): Payer: Self-pay | Admitting: Emergency Medicine

## 2013-08-28 ENCOUNTER — Emergency Department (HOSPITAL_COMMUNITY)
Admission: EM | Admit: 2013-08-28 | Discharge: 2013-08-28 | Disposition: A | Discharge status: Home / Self Care | Payer: Medicare Other | Source: Home / Self Care | Attending: Family Medicine | Admitting: Family Medicine

## 2013-08-28 DIAGNOSIS — S52502A Unspecified fracture of the lower end of left radius, initial encounter for closed fracture: Secondary | ICD-10-CM

## 2013-08-28 DIAGNOSIS — S52599A Other fractures of lower end of unspecified radius, initial encounter for closed fracture: Secondary | ICD-10-CM

## 2013-08-28 MED ORDER — DICLOFENAC POTASSIUM 50 MG PO TABS: 50.0000 mg | ORAL_TABLET | Freq: Three times a day (TID) | ORAL | Status: DC

## 2013-08-28 NOTE — ED Notes (Signed)
[facility] has just finished.

## 2013-08-28 NOTE — ED Provider Notes (Signed)
CSN: [id number]     Arrival date & time 08/28/13  1044 History   First MD Initiated Contact with Patient 08/28/13 1134     Chief Complaint  Patient presents with  . Wrist Injury   (Consider location/radiation/quality/duration/timing/severity/associated sxs/prior Treatment) Patient is a 58 y.o. female presenting with wrist injury. The history is provided by the patient and the spouse.  Wrist Injury Location:  Wrist Time since incident:  4 hours Injury: yes   Mechanism of injury: fall   Fall:    Fall occurred:  Tripped   Impact surface:  Carpet   Entrapped after fall: no   Wrist location:  L wrist Pain details:    Severity:  Mild Chronicity:  New Dislocation: no   Foreign body present:  No foreign bodies Prior injury to area:  No Ineffective treatments:  None tried   Past Medical History  Diagnosis Date  . Seizures   . Bipolar 1 disorder   . Depression   . Adenomatous colon polyp   . Blood transfusion without reported diagnosis   . Elevated LFTs   . Hyperlipidemia   . Hepatic steatosis 05/07/13   Past Surgical History  Procedure Laterality Date  . Appendectomy    . Dilation and curettage of uterus    . Colonoscopy  06/16/2012    Procedure: COLONOSCOPY;  Surgeon: Lafayette Dragon, MD;  Location: [facility];  Service: Endoscopy;  Laterality: N/A;   Family History  Problem Relation Age of Onset  . Colon polyps Mother   . Cancer Mother   . Colon cancer Neg Hx   . Stomach cancer Neg Hx    History  Substance Use Topics  . Smoking status: Never Smoker   . Smokeless tobacco: Never Used  . Alcohol Use: Yes     Comment: occasionaly   OB History   Grav Para Term Preterm Abortions TAB SAB Ect Mult Living                 Review of Systems  Constitutional: Negative.   Musculoskeletal: Positive for joint swelling.  Skin: Negative.     Allergies  Lamictal; Penicillins; and Codeine  Home Medications   Current Outpatient Rx  Name  Route  Sig  Dispense  Refill  .  ALPRAZolam (XANAX) 1 MG tablet   Oral   Take 1 mg by mouth 4 (four) times daily as needed.         Marland Kitchen asenapine (SAPHRIS) 5 MG SUBL   Sublingual   Place 10-20 mg under the tongue at bedtime.         . benztropine (COGENTIN) 1 MG tablet   Oral   Take 1 mg by mouth 2 (two) times daily.         . Cholecalciferol (VITAMIN D PO)   Oral   Take by mouth. Takes 6000 mg daily         . clonazePAM (KLONOPIN) 0.5 MG tablet   Oral   Take 1 mg by mouth at bedtime as needed.          . levETIRAcetam (KEPPRA) 1000 MG tablet   Oral   Take 2 tablets (2,000 mg total) by mouth 2 (two) times daily.   240 tablet   11   . Multiple Vitamin (MULTIVITAMIN) tablet   Oral   Take 1 tablet by mouth daily.         Marland Kitchen OLANZapine (ZYPREXA) 20 MG tablet   Oral   Take 20 mg by mouth  at bedtime.         . Omega-3 Fatty Acids (FISH OIL PO)   Oral   Take by mouth daily.         Marland Kitchen ROPINIROLE HCL PO   Oral   Take 1-2 mg by mouth at bedtime.          . diclofenac (CATAFLAM) 50 MG tablet   Oral   Take 1 tablet (50 mg total) by mouth 3 (three) times daily. For pain   21 tablet   0   . EXPIRED: SUMAtriptan (IMITREX) 100 MG tablet      take 1 tablet as needed for headache, may repeat in 2 hours.   10 tablet   2    BP 121/87  Pulse 86  Temp(Src) 97.6 F (36.4 C) (Oral)  Resp 18  SpO2 96% Physical Exam  Nursing note and vitals reviewed. Constitutional: She is oriented to person, place, and time. She appears well-developed and well-nourished.  HENT:  Head: Normocephalic.  Eyes: Pupils are equal, round, and reactive to light.  Musculoskeletal: She exhibits tenderness.       Left wrist: She exhibits decreased range of motion, bony tenderness, swelling and deformity.  Neurological: She is alert and oriented to person, place, and time.  Skin: Skin is warm and dry.    ED Course  Procedures (including critical care time) Labs Review Labs Reviewed - No data to display Imaging  Review Dg Wrist Complete Left  08/28/2013   CLINICAL DATA:  Fall.  EXAM: LEFT WRIST - COMPLETE 3+ VIEW  COMPARISON:  None.  FINDINGS: There is fracture of the distal radial metaphysis with mild angulation deformity. Old ulnar styloid fracture noted. No other focal abnormality identified.  IMPRESSION: Acute fracture of the distal radial metaphysis. Slight angulation deformity.   Electronically Signed   By: Marcello Moores  Register   On: 08/28/2013 12:51    EKG Interpretation    Date/Time:    Ventricular Rate:    PR Interval:    QRS Duration:   QT Interval:    QTC Calculation:   R Axis:     Text Interpretation:              MDM  X-rays reviewed and report per radiologist.     Billy Fischer, MD 08/28/13 1254

## 2013-08-28 NOTE — Progress Notes (Signed)
[facility] Progress Note Patient Details:  Theresa [name] [birth year] [id number]  Ortho Devices Type of Ortho Device: Arm sling;Sugartong splint Ortho Device/Splint Interventions: Application   Cammer, Theodoro Parma 08/28/2013, 1:14 PM

## 2013-08-28 NOTE — ED Notes (Signed)
Report injuring left wrist around 8-8:30 a.m today.  Pt states loss balance causing her to fall hitting her head and landing on left wrist.  Having pain just below wrist with moving fingers.  Pt has taken ibuprofen with mild relief.  Denies any other injuries.

## 2013-08-28 NOTE — Discharge Instructions (Signed)
Ice, elevate and see orthopedist for recheck in 3-7 days.

## 2013-11-01 ENCOUNTER — Telehealth: Payer: Self-pay | Admitting: *Deleted

## 2013-11-01 NOTE — Telephone Encounter (Signed)
Left a message for patient that labs are due to be repeated. Asked her to call for questions.

## 2013-11-01 NOTE — Telephone Encounter (Signed)
Message copied by Hulan Saas on Thu Nov 01, 2013  9:24 AM ------      Message from: Hulan Saas      Created: Fri Aug 03, 2013  8:50 AM       Call and remind patient due for LFT for DB. Lab in EPIC ------

## 2013-11-02 ENCOUNTER — Other Ambulatory Visit (INDEPENDENT_AMBULATORY_CARE_PROVIDER_SITE_OTHER): Payer: Medicare Other

## 2013-11-02 ENCOUNTER — Telehealth: Payer: Self-pay | Admitting: Internal Medicine

## 2013-11-02 DIAGNOSIS — R945 Abnormal results of liver function studies: Principal | ICD-10-CM

## 2013-11-02 DIAGNOSIS — R7989 Other specified abnormal findings of blood chemistry: Secondary | ICD-10-CM

## 2013-11-02 LAB — HEPATIC FUNCTION PANEL
ALT: 82 U/L — ABNORMAL HIGH (ref 0–35)
AST: 49 U/L — ABNORMAL HIGH (ref 0–37)
Albumin: 4.3 g/dL (ref 3.5–5.2)
Alkaline Phosphatase: 92 U/L (ref 39–117)
Bilirubin, Direct: 0 mg/dL (ref 0.0–0.3)
Total Bilirubin: 0.4 mg/dL (ref 0.3–1.2)
Total Protein: 7.1 g/dL (ref 6.0–8.3)

## 2013-11-02 NOTE — Telephone Encounter (Signed)
Spoke with patient and told her the labs to repeat are LFT. She will come for labs.

## 2013-11-06 ENCOUNTER — Other Ambulatory Visit: Payer: Self-pay | Admitting: *Deleted

## 2013-11-06 DIAGNOSIS — R7989 Other specified abnormal findings of blood chemistry: Secondary | ICD-10-CM

## 2013-11-06 DIAGNOSIS — R945 Abnormal results of liver function studies: Secondary | ICD-10-CM

## 2013-11-30 ENCOUNTER — Encounter: Payer: Self-pay | Admitting: *Deleted

## 2014-02-25 ENCOUNTER — Encounter: Payer: Medicare Other | Admitting: Family Medicine

## 2014-03-06 DIAGNOSIS — Z0271 Encounter for disability determination: Secondary | ICD-10-CM

## 2014-03-25 ENCOUNTER — Ambulatory Visit: Payer: Medicare Other

## 2014-03-25 ENCOUNTER — Ambulatory Visit (INDEPENDENT_AMBULATORY_CARE_PROVIDER_SITE_OTHER): Payer: Medicare Other | Admitting: Family Medicine

## 2014-03-25 ENCOUNTER — Ambulatory Visit (INDEPENDENT_AMBULATORY_CARE_PROVIDER_SITE_OTHER): Payer: Medicare Other

## 2014-03-25 ENCOUNTER — Encounter: Payer: Self-pay | Admitting: Family Medicine

## 2014-03-25 VITALS — BP 110/80 | HR 92 | Temp 98.8°F | Resp 16 | Ht 66.0 in | Wt 166.0 lb

## 2014-03-25 DIAGNOSIS — E785 Hyperlipidemia, unspecified: Secondary | ICD-10-CM

## 2014-03-25 DIAGNOSIS — R05 Cough: Secondary | ICD-10-CM

## 2014-03-25 DIAGNOSIS — R7309 Other abnormal glucose: Secondary | ICD-10-CM

## 2014-03-25 DIAGNOSIS — R7989 Other specified abnormal findings of blood chemistry: Secondary | ICD-10-CM

## 2014-03-25 DIAGNOSIS — R053 Chronic cough: Secondary | ICD-10-CM

## 2014-03-25 DIAGNOSIS — M542 Cervicalgia: Secondary | ICD-10-CM

## 2014-03-25 DIAGNOSIS — R059 Cough, unspecified: Secondary | ICD-10-CM

## 2014-03-25 DIAGNOSIS — R945 Abnormal results of liver function studies: Secondary | ICD-10-CM

## 2014-03-25 LAB — CBC
HCT: 42.6 % (ref 36.0–46.0)
Hemoglobin: 14.8 g/dL (ref 12.0–15.0)
MCH: 27.6 pg (ref 26.0–34.0)
MCHC: 34.7 g/dL (ref 30.0–36.0)
MCV: 79.5 fL (ref 78.0–100.0)
Platelets: 216 10*3/uL (ref 150–400)
RBC: 5.36 MIL/uL — ABNORMAL HIGH (ref 3.87–5.11)
RDW: 14 % (ref 11.5–15.5)
WBC: 7.3 10*3/uL (ref 4.0–10.5)

## 2014-03-25 MED ORDER — CYCLOBENZAPRINE HCL 7.5 MG PO TABS: 7.5000 mg | ORAL_TABLET | Freq: Three times a day (TID) | ORAL | Status: DC | PRN

## 2014-03-25 NOTE — Progress Notes (Signed)
[facility] [address] [place] [address] [phone]  Date:  03/25/2014   Name:  Theresa [name]   DOB:  [birth year]   MRN:  [id number]  PCP:  Theresa Blinks, MD    Chief Complaint: Annual Exam   History of Present Illness:  Theresa [name] is a 58 y.o. very pleasant female patient who presents with the following:  Here today for a CPE; however due to her other concerns ended up converting visit to an OV today.  History of bipolar disorder and epilepsy as below.  She has noticed some pain in her neck for several years.  She has seen PT for this in the past; no recent films.  She still tries to do her PT exercises, but does sometimes notice that her neck is still sore.  She notes sore muscles in the back of her neck and trapezius.    She also notes a cough but does not know why this is. She has not noted any illness to cause this.  It has been present for 9 months or more.  It may come and go. No fever, no hemoptysis.  She has never been a smoker, cough is not productive  She sees Dr. Yancey [name] for her OB-G care    She did have some cereal this am.   We had noted elevated LFTs last year- she is seeing Dr. Olevia [name] for this issue and is following up regularly.  Will check this labs for her again today  Patient Active Problem List   Diagnosis Date Noted  . Special screening for malignant neoplasms, colon 06/16/2012  . Personal history of colonic polyps 06/16/2012  . Bipolar disorder 04/02/2012  . 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 Seizures   . Bipolar 1 disorder   . Depression   . Adenomatous colon polyp   . Blood transfusion without reported diagnosis   . Elevated LFTs   . Hyperlipidemia   . Hepatic steatosis 05/07/13  . Allergy   . Anxiety     Past Surgical History  Procedure Laterality Date  . Appendectomy    . Dilation and curettage of uterus    . Colonoscopy  06/16/2012    Procedure:  COLONOSCOPY;  Surgeon: Theresa Dragon, MD;  Location: [facility];  Service: Endoscopy;  Laterality: N/A;    History  Substance Use Topics  . Smoking status: Never Smoker   . Smokeless tobacco: Never Used  . Alcohol Use: Yes     Comment: occasionaly 1 a month    Family History  Problem Relation Age of Onset  . Colon polyps Mother   . Cancer Mother   . Heart disease Mother   . Colon cancer Neg Hx   . Stomach cancer Neg Hx   . Cancer Father     Allergies  Allergen Reactions  . Lamictal [Lamotrigine]     Acute renal failure  . Penicillins     Abdominal pain  . Codeine Swelling and Rash    Medication list has been reviewed and updated.  Current Outpatient Prescriptions on File Prior to Visit  Medication Sig Dispense Refill  . asenapine (SAPHRIS) 5 MG SUBL Place 10-20 mg under the tongue at bedtime.      . clonazePAM (KLONOPIN) 0.5 MG tablet Take 1 mg by mouth at bedtime as needed.       . levETIRAcetam (KEPPRA) 1000 MG tablet Take 2 tablets (2,000 mg  total) by mouth 2 (two) times daily.  240 tablet  11  . Multiple Vitamin (MULTIVITAMIN) tablet Take 1 tablet by mouth daily.      Marland Kitchen OLANZapine (ZYPREXA) 20 MG tablet Take 20 mg by mouth at bedtime.      . Omega-3 Fatty Acids (FISH OIL PO) Take by mouth daily.      Marland Kitchen ROPINIROLE HCL PO Take 1-2 mg by mouth at bedtime.       . ALPRAZolam (XANAX) 1 MG tablet Take 1 mg by mouth 4 (four) times daily as needed.      . benztropine (COGENTIN) 1 MG tablet Take 1 mg by mouth 2 (two) times daily.      . Cholecalciferol (VITAMIN D PO) Take by mouth. Takes 6000 mg daily      . diclofenac (CATAFLAM) 50 MG tablet Take 1 tablet (50 mg total) by mouth 3 (three) times daily. For pain  21 tablet  0  . SUMAtriptan (IMITREX) 100 MG tablet take 1 tablet as needed for headache, may repeat in 2 hours.  10 tablet  2   No current facility-administered medications on file prior to visit.    Review of Systems:  As per HPI- otherwise  negative.   Physical Examination: Filed Vitals:   03/25/14 1035  BP: 110/80  Pulse: 92  Temp: 98.8 F (37.1 C)  Resp: 16   Filed Vitals:   03/25/14 1035  Height: 5\' 6"  (1.676 m)  Weight: 166 lb (75.297 kg)   Body mass index is 26.81 kg/(m^2). Ideal Body Weight: Weight in (lb) to have BMI = 25: 154.6  GEN: WDWN, NAD, Non-toxic, A & O x 3, looks well HEENT: Atraumatic, Normocephalic. Neck supple. No masses, No LAD.  Bilateral TM wnl, oropharynx normal.  PEERL,EOMI.   Cervical spine: no bony tenderness, but she is tender and shows spasm over the bilateral superior trapezius muscles.  Normal ROM of the cervical spine.   Ears and Nose: No external deformity. CV: RRR, No M/G/R. No JVD. No thrill. No extra heart sounds. PULM: CTA B, no wheezes, crackles, rhonchi. No retractions. No resp. distress. No accessory muscle use. ABD: S, NT, ND, +BS. No rebound. No HSM. EXTR: No c/c/e NEURO Normal gait.  PSYCH: Normally interactive. Conversant. Not depressed or anxious appearing.  Calm demeanor.   [facility] reading (PRIMARY) by  Dr. Lorelei [name]. OVZ:CHYIFOYD C spine: mild degenerative change C6/7  EXAM: CERVICAL SPINE - 2-3 VIEW  COMPARISON: None.  FINDINGS: Diffuse degenerative changes noted of the cervical spine. Diffuse osteopenia is present. No evidence of fracture dislocation. Pulmonary apices are clear.  IMPRESSION: No acute abnormality.  CHEST 2 VIEW  COMPARISON: 09/14/2009  FINDINGS: Mediastinum and hilar structures are normal. Pulmonary nodular opacity projecting right upper lobe most likely confluence of shadows, stable. No pleural effusion or pneumothorax. Pleural parenchymal scarring. No acute bony abnormality.  IMPRESSION: No acute pulmonary disease    Assessment and Plan: Persistent cough - Plan: DG Chest 2 View  Elevated liver function tests - Plan: CBC, Comprehensive metabolic panel  Dyslipidemia (high LDL; low HDL) - Plan: TSH, LDL cholesterol,  direct  Elevated random blood glucose level - Plan: Hemoglobin A1c  Neck pain - Plan: DG Cervical Spine 2 or 3 views, cyclobenzaprine (FEXMID) 7.5 MG tablet  Will try treating her for allergies, and if unsuccessful GERD for her chronic cough. If still not helpful consider pulmonary referral Labs pending as above.   She will try some flexeril as needed for her trapezius  pain.   Signed Theresa Blinks, MD  Send LFTs to Dr. Olevia [name] She is on mychart

## 2014-03-25 NOTE — Patient Instructions (Addendum)
Try taking OTC claritin or zyrtec daily for 2 weeks. If this is helpful for your cough you can continue to take this. If not, try a 2 week trial of zantac or prilosec.  If still not helpful please call or send me a mychart message.   You may also try some of the flexeril as needed for your neck pain. This is a muscle relaxer and may make you feel sleepy.  Be especially cautious if you use this in combination with your klonopin, xanax, and psychiatric medications.  A 1/2 dose may be enough for your neck pain.    I will be in touch with your labs asap

## 2014-03-26 LAB — COMPREHENSIVE METABOLIC PANEL
ALT: 63 U/L — ABNORMAL HIGH (ref 0–35)
AST: 43 U/L — ABNORMAL HIGH (ref 0–37)
Albumin: 4.8 g/dL (ref 3.5–5.2)
Alkaline Phosphatase: 98 U/L (ref 39–117)
BUN: 11 mg/dL (ref 6–23)
CO2: 24 mEq/L (ref 19–32)
Calcium: 10 mg/dL (ref 8.4–10.5)
Chloride: 107 mEq/L (ref 96–112)
Creat: 0.97 mg/dL (ref 0.50–1.10)
Glucose, Bld: 115 mg/dL — ABNORMAL HIGH (ref 70–99)
Potassium: 4.4 mEq/L (ref 3.5–5.3)
Sodium: 143 mEq/L (ref 135–145)
Total Bilirubin: 0.4 mg/dL (ref 0.2–1.2)
Total Protein: 7.4 g/dL (ref 6.0–8.3)

## 2014-03-26 LAB — LDL CHOLESTEROL, DIRECT: Direct LDL: 189 mg/dL — ABNORMAL HIGH

## 2014-03-26 LAB — HEMOGLOBIN A1C
Hgb A1c MFr Bld: 6 % — ABNORMAL HIGH (ref <5.7)
Mean Plasma Glucose: 126 mg/dL — ABNORMAL HIGH (ref <117)

## 2014-03-26 LAB — TSH: TSH: 2.343 u[IU]/mL (ref 0.350–4.500)

## 2014-04-02 ENCOUNTER — Encounter: Payer: Self-pay | Admitting: Family Medicine

## 2014-04-22 ENCOUNTER — Other Ambulatory Visit: Payer: Self-pay | Admitting: Family Medicine

## 2014-04-22 DIAGNOSIS — M542 Cervicalgia: Secondary | ICD-10-CM

## 2014-04-23 ENCOUNTER — Telehealth: Payer: Self-pay | Admitting: *Deleted

## 2014-04-23 DIAGNOSIS — K7689 Other specified diseases of liver: Secondary | ICD-10-CM

## 2014-04-23 NOTE — Telephone Encounter (Signed)
Message copied by Larina Bras on Tue Apr 23, 2014  9:04 AM ------      Message from: Larina Bras      Created: Tue Jul 31, 2013  3:55 PM       Patient needs follow up ultrasound of liver only (per db) around 04/2014 to follow up on liver cysts seen on 04/2013 ultrasound. Call pt and schedule appt. ------

## 2014-04-23 NOTE — Telephone Encounter (Signed)
Patient has been scheduled for limited abdominal ultrasound attn:liver at [facility] on Friday, 04/26/14 @ 8 am. I have spoken to patient and her husband to advise of this as well as to remain NPO 6 hours prior to the ultrasound. They both verbalize understanding.

## 2014-04-24 ENCOUNTER — Encounter: Payer: Self-pay | Admitting: Family Medicine

## 2014-04-24 NOTE — Telephone Encounter (Signed)
Dr Copland, do you want to RF this? 

## 2014-04-26 ENCOUNTER — Other Ambulatory Visit: Payer: Self-pay | Admitting: *Deleted

## 2014-04-26 ENCOUNTER — Ambulatory Visit (HOSPITAL_COMMUNITY)
Admission: RE | Admit: 2014-04-26 | Discharge: 2014-04-26 | Disposition: A | Discharge status: Home / Self Care | Payer: Medicare Other | Source: Ambulatory Visit | Attending: Internal Medicine | Admitting: Internal Medicine

## 2014-04-26 DIAGNOSIS — K7689 Other specified diseases of liver: Secondary | ICD-10-CM | POA: Insufficient documentation

## 2014-04-26 DIAGNOSIS — R7989 Other specified abnormal findings of blood chemistry: Secondary | ICD-10-CM | POA: Diagnosis present

## 2014-04-26 DIAGNOSIS — K760 Fatty (change of) liver, not elsewhere classified: Secondary | ICD-10-CM

## 2014-04-29 NOTE — Telephone Encounter (Signed)
Called to check in with her- she is using flexeril every few days, her neck is sore but better.

## 2014-05-02 ENCOUNTER — Telehealth: Payer: Self-pay | Admitting: *Deleted

## 2014-05-02 NOTE — Telephone Encounter (Signed)
Patient will come for labs.  

## 2014-05-02 NOTE — Telephone Encounter (Signed)
Message copied by Hulan Saas on Thu May 02, 2014  8:08 AM ------      Message from: Hulan Saas      Created: Tue Nov 06, 2013  8:43 AM       Call and remind due for LFT for DB on 05/06/14. Lab in EPIC ------

## 2014-05-13 ENCOUNTER — Other Ambulatory Visit (INDEPENDENT_AMBULATORY_CARE_PROVIDER_SITE_OTHER): Payer: Medicare Other

## 2014-05-13 DIAGNOSIS — R7989 Other specified abnormal findings of blood chemistry: Secondary | ICD-10-CM

## 2014-05-13 DIAGNOSIS — R945 Abnormal results of liver function studies: Secondary | ICD-10-CM

## 2014-05-13 LAB — HEPATIC FUNCTION PANEL
ALT: 71 U/L — ABNORMAL HIGH (ref 0–35)
AST: 41 U/L — ABNORMAL HIGH (ref 0–37)
Albumin: 4.1 g/dL (ref 3.5–5.2)
Alkaline Phosphatase: 102 U/L (ref 39–117)
Bilirubin, Direct: 0 mg/dL (ref 0.0–0.3)
Total Bilirubin: 0.6 mg/dL (ref 0.2–1.2)
Total Protein: 7.4 g/dL (ref 6.0–8.3)

## 2014-05-14 ENCOUNTER — Other Ambulatory Visit: Payer: Self-pay | Admitting: *Deleted

## 2014-05-14 DIAGNOSIS — R7989 Other specified abnormal findings of blood chemistry: Secondary | ICD-10-CM

## 2014-05-14 DIAGNOSIS — R945 Abnormal results of liver function studies: Secondary | ICD-10-CM

## 2014-06-23 ENCOUNTER — Encounter: Payer: Self-pay | Admitting: Family Medicine

## 2014-06-26 ENCOUNTER — Encounter: Payer: Self-pay | Admitting: Family Medicine

## 2014-09-03 ENCOUNTER — Other Ambulatory Visit (HOSPITAL_COMMUNITY): Payer: Self-pay | Admitting: Obstetrics and Gynecology

## 2014-09-03 DIAGNOSIS — Z1231 Encounter for screening mammogram for malignant neoplasm of breast: Secondary | ICD-10-CM

## 2014-10-08 ENCOUNTER — Ambulatory Visit (HOSPITAL_COMMUNITY)
Admission: RE | Admit: 2014-10-08 | Discharge: 2014-10-08 | Disposition: A | Discharge status: Home / Self Care | Payer: Medicare Other | Source: Ambulatory Visit | Attending: Obstetrics and Gynecology | Admitting: Obstetrics and Gynecology

## 2014-10-08 DIAGNOSIS — Z1231 Encounter for screening mammogram for malignant neoplasm of breast: Secondary | ICD-10-CM | POA: Diagnosis not present

## 2014-10-09 ENCOUNTER — Other Ambulatory Visit: Payer: Self-pay | Admitting: Obstetrics and Gynecology

## 2014-10-09 DIAGNOSIS — R928 Other abnormal and inconclusive findings on diagnostic imaging of breast: Secondary | ICD-10-CM

## 2014-10-15 ENCOUNTER — Ambulatory Visit
Admission: RE | Admit: 2014-10-15 | Discharge: 2014-10-15 | Disposition: A | Discharge status: Home / Self Care | Payer: Medicare Other | Source: Ambulatory Visit | Attending: Obstetrics and Gynecology | Admitting: Obstetrics and Gynecology

## 2014-10-15 DIAGNOSIS — R928 Other abnormal and inconclusive findings on diagnostic imaging of breast: Secondary | ICD-10-CM

## 2014-10-15 DIAGNOSIS — N63 Unspecified lump in breast: Secondary | ICD-10-CM | POA: Diagnosis not present

## 2014-10-21 ENCOUNTER — Encounter: Payer: Self-pay | Admitting: *Deleted

## 2014-10-22 DIAGNOSIS — H31093 Other chorioretinal scars, bilateral: Secondary | ICD-10-CM | POA: Diagnosis not present

## 2014-10-22 DIAGNOSIS — H43811 Vitreous degeneration, right eye: Secondary | ICD-10-CM | POA: Diagnosis not present

## 2014-10-22 DIAGNOSIS — H02834 Dermatochalasis of left upper eyelid: Secondary | ICD-10-CM | POA: Diagnosis not present

## 2014-10-22 DIAGNOSIS — H02831 Dermatochalasis of right upper eyelid: Secondary | ICD-10-CM | POA: Diagnosis not present

## 2014-10-22 DIAGNOSIS — H25813 Combined forms of age-related cataract, bilateral: Secondary | ICD-10-CM | POA: Diagnosis not present

## 2014-10-28 ENCOUNTER — Telehealth: Payer: Self-pay | Admitting: *Deleted

## 2014-10-28 NOTE — Telephone Encounter (Signed)
Patient will come for labs.  

## 2014-10-28 NOTE — Telephone Encounter (Signed)
-----   Message from Hulan Saas, RN sent at 04/26/2014  1:27 PM EDT ----- Call and remind LFT due for DB on 10/28/14. Lab in EPIC.

## 2014-10-28 NOTE — Telephone Encounter (Signed)
Left a message for patient to call back. 

## 2014-11-28 ENCOUNTER — Other Ambulatory Visit (INDEPENDENT_AMBULATORY_CARE_PROVIDER_SITE_OTHER): Payer: Medicare Other

## 2014-11-28 ENCOUNTER — Other Ambulatory Visit: Payer: Self-pay | Admitting: *Deleted

## 2014-11-28 DIAGNOSIS — R7989 Other specified abnormal findings of blood chemistry: Secondary | ICD-10-CM

## 2014-11-28 DIAGNOSIS — R945 Abnormal results of liver function studies: Principal | ICD-10-CM

## 2014-11-28 LAB — HEPATIC FUNCTION PANEL
ALT: 28 U/L (ref 0–35)
AST: 23 U/L (ref 0–37)
Albumin: 4.1 g/dL (ref 3.5–5.2)
Alkaline Phosphatase: 109 U/L (ref 39–117)
Bilirubin, Direct: 0.1 mg/dL (ref 0.0–0.3)
Total Bilirubin: 0.3 mg/dL (ref 0.2–1.2)
Total Protein: 7 g/dL (ref 6.0–8.3)

## 2014-12-06 DIAGNOSIS — G40319 Generalized idiopathic epilepsy and epileptic syndromes, intractable, without status epilepticus: Secondary | ICD-10-CM | POA: Diagnosis not present

## 2015-01-02 ENCOUNTER — Ambulatory Visit (INDEPENDENT_AMBULATORY_CARE_PROVIDER_SITE_OTHER): Payer: Medicare Other | Admitting: Family Medicine

## 2015-01-02 ENCOUNTER — Ambulatory Visit (INDEPENDENT_AMBULATORY_CARE_PROVIDER_SITE_OTHER): Payer: Medicare Other

## 2015-01-02 VITALS — BP 114/78 | HR 96 | Temp 97.8°F | Resp 12 | Ht 66.5 in | Wt 165.2 lb

## 2015-01-02 DIAGNOSIS — R05 Cough: Secondary | ICD-10-CM

## 2015-01-02 DIAGNOSIS — R059 Cough, unspecified: Secondary | ICD-10-CM

## 2015-01-02 DIAGNOSIS — R0781 Pleurodynia: Secondary | ICD-10-CM | POA: Diagnosis not present

## 2015-01-02 DIAGNOSIS — J0100 Acute maxillary sinusitis, unspecified: Secondary | ICD-10-CM | POA: Diagnosis not present

## 2015-01-02 DIAGNOSIS — J209 Acute bronchitis, unspecified: Secondary | ICD-10-CM

## 2015-01-02 MED ORDER — BENZONATATE 100 MG PO CAPS
200.0000 mg | ORAL_CAPSULE | Freq: Two times a day (BID) | ORAL | Status: DC | PRN
Start: 2015-01-02 — End: 2015-03-10

## 2015-01-02 MED ORDER — AZITHROMYCIN 250 MG PO TABS: ORAL_TABLET | ORAL | Status: DC

## 2015-01-02 MED ORDER — DOXYCYCLINE HYCLATE 100 MG PO TABS: 100.0000 mg | ORAL_TABLET | Freq: Two times a day (BID) | ORAL | Status: DC

## 2015-01-02 NOTE — Progress Notes (Signed)
Chief Complaint:  Chief Complaint  Patient presents with  . Fatigue  . Fever    unspecified  . Cough    Chest Pain When Taking Deep Breaths. Cough Semi-Productive    HPI: Theresa [name] is a 59 y.o. female who is here for 2 days ago had subjective fever, msk aches and chills. Denies shortness of breath or wheezing.  Cough is semi productive. She ahs taken some dimetapp without relief. Her husband was dx with bronchitis.  She denies any history of seasonal allergies or asthma. She has had subjective fevers, fatigue, pleuritic chest pain with excessive cough. She has a history of seizures, bipolar 1, and also hepatic steatosis  Past Medical History  Diagnosis Date  . Seizures   . Bipolar 1 disorder   . Depression   . Adenomatous colon polyp   . Blood transfusion without reported diagnosis   . Elevated LFTs   . Hyperlipidemia   . Hepatic steatosis 05/07/13  . Allergy   . Anxiety    Past Surgical History  Procedure Laterality Date  . Appendectomy    . Dilation and curettage of uterus    . Colonoscopy  06/16/2012    Procedure: COLONOSCOPY;  Surgeon: Lafayette Dragon, MD;  Location: [facility];  Service: Endoscopy;  Laterality: N/A;   History   Social History  . Marital Status: Married    Spouse Name: N/A  . Number of Children: N/A  . Years of Education: N/A   Social History Main Topics  . Smoking status: Never Smoker   . Smokeless tobacco: Never Used  . Alcohol Use: Yes     Comment: occasionaly 1 a month  . Drug Use: No  . Sexual Activity: Not Currently   Other Topics Concern  . None   Social History Narrative   Married. Education: [employer]. Exercise: Yes   Family History  Problem Relation Age of Onset  . Colon polyps Mother   . Cancer Mother   . Heart disease Mother   . Colon cancer Neg Hx   . Stomach cancer Neg Hx   . Cancer Father    Allergies  Allergen Reactions  . Lamictal [Lamotrigine]     Acute renal failure  . Penicillins    Abdominal pain  . Codeine Swelling and Rash   Prior to Admission medications   Medication Sig Start Date End Date Taking? Authorizing Provider  asenapine (SAPHRIS) 5 MG SUBL Place 10-20 mg under the tongue at bedtime.   Yes Historical Provider, MD  Carbamazepine (EQUETRO) 300 MG CP12 Take by mouth.   Yes Historical Provider, MD  Cholecalciferol (VITAMIN D PO) Take by mouth. Takes 6000 mg daily   Yes Historical Provider, MD  clonazePAM (KLONOPIN) 0.5 MG tablet Take 1 mg by mouth at bedtime as needed.    Yes Historical Provider, MD  cyclobenzaprine (FEXMID) 7.5 MG tablet TAKE 1 TABLET 3 TIMES A DAY AS NEEDED MUSCLE SPASMS 04/24/14  Yes Gay Filler Copland, MD  levETIRAcetam (KEPPRA) 1000 MG tablet Take 2 tablets (2,000 mg total) by mouth 2 (two) times daily. 10/15/11  Yes Clearnce Sorrel, MD  Multiple Vitamin (MULTIVITAMIN) tablet Take 1 tablet by mouth daily.   Yes Historical Provider, MD  OLANZapine (ZYPREXA) 20 MG tablet Take 20 mg by mouth at bedtime. 03/27/12  Yes Historical Provider, MD  Omega-3 Fatty Acids (FISH OIL PO) Take by mouth daily.   Yes Historical Provider, MD  PRESCRIPTION MEDICATION Zolpidem tartrate 10 mg taking  1-1/2 tab in the pm   Yes Historical Provider, MD  ROPINIROLE HCL PO Take 1-2 mg by mouth at bedtime.    Yes Historical Provider, MD  traZODone (DESYREL) 100 MG tablet Take 100 mg by mouth at bedtime.   Yes Historical Provider, MD  benztropine (COGENTIN) 1 MG tablet Take 1 mg by mouth 2 (two) times daily.    Historical Provider, MD  diclofenac (CATAFLAM) 50 MG tablet Take 1 tablet (50 mg total) by mouth 3 (three) times daily. For pain Patient not taking: Reported on 01/02/2015 08/28/13   Billy Fischer, MD  SUMAtriptan (IMITREX) 100 MG tablet take 1 tablet as needed for headache, may repeat in 2 hours. 02/14/12 04/09/13  Clearnce Sorrel, MD     ROS: The patient denies fevers, chills, night sweats, unintentional weight loss, chest pain, palpitations, wheezing, dyspnea on exertion,  nausea, vomiting, abdominal pain, dysuria, hematuria, melena, numbness, weakness, or tingling.  All other systems have been reviewed and were otherwise negative with the exception of those mentioned in the HPI and as above.    PHYSICAL EXAM: Filed Vitals:   01/02/15 0836  BP: 114/78  Pulse: 96  Temp: 97.8 F (36.6 C)  Resp: 12   SpO2 Readings from Last 3 Encounters:  01/02/15 100%  03/25/14 94%  08/28/13 96%    Filed Vitals:   01/02/15 0836  Height: 5' 6.5" (1.689 m)  Weight: 165 lb 3.2 oz (74.934 kg)   Body mass index is 26.27 kg/(m^2).  General: Alert, no acute distress HEENT:  Normocephalic, atraumatic, oropharynx patent. EOMI, PERRLA Erythematous throat, no exudates, TM normal, + sinus tenderness, + erythematous/boggy nasal mucosa Cardiovascular:  Regular rate and rhythm, no rubs murmurs or gallops.  No Carotid bruits, radial pulse intact. No pedal edema.  Respiratory: Clear to auscultation bilaterally.  No wheezes, rales, or rhonchi.  No cyanosis, no use of accessory musculature GI: No organomegaly, abdomen is soft and non-tender, positive bowel sounds.  No masses. Skin: No rashes. Neurologic: Facial musculature symmetric. Psychiatric: Patient is appropriate throughout our interaction. Lymphatic: No cervical lymphadenopathy Musculoskeletal: Gait intact.   LABS: Results for orders placed or performed in visit on 11/28/14  Hepatic function panel  Result Value Ref Range   Total Bilirubin 0.3 0.2 - 1.2 mg/dL   Bilirubin, Direct 0.1 0.0 - 0.3 mg/dL   Alkaline Phosphatase 109 39 - 117 U/L   AST 23 0 - 37 U/L   ALT 28 0 - 35 U/L   Total Protein 7.0 6.0 - 8.3 g/dL   Albumin 4.1 3.5 - 5.2 g/dL     EKG/XRAY:   Primary read interpreted by Dr. Marin Comment at [facility]. Neg xray    ASSESSMENT/PLAN: Encounter Diagnoses  Name Primary?  . Cough   . Chest pain, pleuritic   . Acute maxillary sinusitis, recurrence not specified Yes  . Acute bronchitis, unspecified organism     59 year old female with past medical history of seizures and bipolar 1 disorder. She is on a lot of anti-seizure medicines and also antipsychotic medicines.  X-rays were negative. I will be very cautious with her medications. She has taken doxycycline before so we will prescribe doxycycline for acute sinusitis and bronchitis. Over-the-counter cough medicine Follow-up as needed  Gross sideeffects, risk and benefits, and alternatives of medications d/w patient. Patient is aware that all medications have potential sideeffects and we are unable to predict every sideeffect or drug-drug interaction that may occur.  Antero Derosia, [place], DO 01/02/2015 11:55 AM

## 2015-03-10 ENCOUNTER — Encounter: Payer: Self-pay | Admitting: Family Medicine

## 2015-03-10 ENCOUNTER — Ambulatory Visit (INDEPENDENT_AMBULATORY_CARE_PROVIDER_SITE_OTHER): Payer: Medicare Other | Admitting: Family Medicine

## 2015-03-10 VITALS — BP 114/75 | HR 85 | Temp 98.1°F | Resp 16 | Ht 66.5 in | Wt 165.0 lb

## 2015-03-10 DIAGNOSIS — E785 Hyperlipidemia, unspecified: Secondary | ICD-10-CM | POA: Diagnosis not present

## 2015-03-10 DIAGNOSIS — Z0000 Encounter for general adult medical examination without abnormal findings: Secondary | ICD-10-CM | POA: Diagnosis not present

## 2015-03-10 DIAGNOSIS — E871 Hypo-osmolality and hyponatremia: Secondary | ICD-10-CM

## 2015-03-10 DIAGNOSIS — G40909 Epilepsy, unspecified, not intractable, without status epilepticus: Secondary | ICD-10-CM | POA: Diagnosis not present

## 2015-03-10 DIAGNOSIS — F0630 Mood disorder due to known physiological condition, unspecified: Secondary | ICD-10-CM

## 2015-03-10 DIAGNOSIS — R739 Hyperglycemia, unspecified: Secondary | ICD-10-CM | POA: Diagnosis not present

## 2015-03-10 DIAGNOSIS — Z5181 Encounter for therapeutic drug level monitoring: Secondary | ICD-10-CM

## 2015-03-10 LAB — COMPREHENSIVE METABOLIC PANEL
ALT: 27 U/L (ref 0–35)
AST: 23 U/L (ref 0–37)
Albumin: 4.5 g/dL (ref 3.5–5.2)
Alkaline Phosphatase: 122 U/L — ABNORMAL HIGH (ref 39–117)
BUN: 10 mg/dL (ref 6–23)
CO2: 24 mEq/L (ref 19–32)
Calcium: 9.9 mg/dL (ref 8.4–10.5)
Chloride: 96 mEq/L (ref 96–112)
Creat: 0.85 mg/dL (ref 0.50–1.10)
Glucose, Bld: 105 mg/dL — ABNORMAL HIGH (ref 70–99)
Potassium: 4.3 mEq/L (ref 3.5–5.3)
Sodium: 131 mEq/L — ABNORMAL LOW (ref 135–145)
Total Bilirubin: 0.4 mg/dL (ref 0.2–1.2)
Total Protein: 7.1 g/dL (ref 6.0–8.3)

## 2015-03-10 LAB — CBC
HCT: 42.1 % (ref 36.0–46.0)
Hemoglobin: 14.5 g/dL (ref 12.0–15.0)
MCH: 28.3 pg (ref 26.0–34.0)
MCHC: 34.4 g/dL (ref 30.0–36.0)
MCV: 82.1 fL (ref 78.0–100.0)
MPV: 9.6 fL (ref 8.6–12.4)
Platelets: 243 10*3/uL (ref 150–400)
RBC: 5.13 MIL/uL — ABNORMAL HIGH (ref 3.87–5.11)
RDW: 13.9 % (ref 11.5–15.5)
WBC: 5.2 10*3/uL (ref 4.0–10.5)

## 2015-03-10 LAB — LIPID PANEL
Cholesterol: 291 mg/dL — ABNORMAL HIGH (ref 0–200)
HDL: 56 mg/dL (ref >=46)
LDL Cholesterol: 190 mg/dL — ABNORMAL HIGH (ref 0–99)
Total CHOL/HDL Ratio: 5.2 Ratio
Triglycerides: 223 mg/dL — ABNORMAL HIGH (ref <150)
VLDL: 45 mg/dL — ABNORMAL HIGH (ref 0–40)

## 2015-03-10 LAB — HEMOGLOBIN A1C
Hgb A1c MFr Bld: 5.6 % (ref <5.7)
Mean Plasma Glucose: 114 mg/dL (ref <117)

## 2015-03-10 LAB — TSH: TSH: 1.745 u[IU]/mL (ref 0.350–4.500)

## 2015-03-10 NOTE — Progress Notes (Addendum)
[facility] [address], [place] [address] [phone]  Date:  03/10/2015   Name:  Theresa [name]   DOB:  02/25/1956   MRN:  [id number]  PCP:  Lamar Blinks, MD    Chief Complaint: Annual Exam   History of Present Illness:  Theresa [name] is a 59 y.o. very pleasant female patient who presents with the following:  Here today for a CPE.  She is doing well Pap 2 years ago per her OBG.  She continues to follow-up with her for her women's care.   mammo done this year already colonoscpy UTD She is fasting today for labs  She has not had a seizure in 3 years,  She is on Keppra and is doing well  She is treated for her bipolar 1 by Dr. Caprice Beaver.  She feels like her mood is good, she feels stable.   She is a never smoker She does note that the tip of her right thumb has been numb for several months. It does not hurt.  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    Wt Readings from Last 3 Encounters:  03/10/15 165 lb (74.844 kg)  01/02/15 165 lb 3.2 oz (74.934 kg)  03/25/14 166 lb (75.297 kg)      Patient Active Problem List   Diagnosis Date Noted  . Special screening for malignant neoplasms, colon 06/16/2012  . Personal history of colonic polyps 06/16/2012  . Bipolar disorder 04/02/2012  . 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 Seizures   . Bipolar 1 disorder   . Depression   . Adenomatous colon polyp   . Blood transfusion without reported diagnosis   . Elevated LFTs   . Hyperlipidemia   . Hepatic steatosis 05/07/13  . Allergy   . Anxiety     Past Surgical History  Procedure Laterality Date  . Appendectomy    . Dilation and curettage of uterus    . Colonoscopy  06/16/2012   Procedure: COLONOSCOPY;  Surgeon: Lafayette Dragon, MD;  Location: [facility];  Service: Endoscopy;  Laterality: N/A;    History  Substance Use Topics  . Smoking status: Never Smoker   . Smokeless tobacco: Never Used  . Alcohol Use: Yes     Comment: occasionaly 1 a month    Family History  Problem Relation Age of Onset  . Colon polyps Mother   . Cancer Mother   . Heart disease Mother   . Colon cancer Neg Hx   . Stomach cancer Neg Hx   . Cancer Father     Allergies  Allergen Reactions  . Lamictal [Lamotrigine]     Acute renal failure  . Penicillins     Abdominal pain  . Codeine Swelling and Rash    Medication list has been reviewed and updated.  Current Outpatient Prescriptions on File Prior to Visit  Medication Sig Dispense Refill  . asenapine (SAPHRIS) 5 MG SUBL Place 10-20 mg under the tongue at bedtime.    . Carbamazepine (EQUETRO) 300 MG CP12 Take by mouth.    . clonazePAM (KLONOPIN) 0.5 MG tablet Take 1 mg by mouth at bedtime as needed.     Marland Kitchen  levETIRAcetam (KEPPRA) 1000 MG tablet Take 2 tablets (2,000 mg total) by mouth 2 (two) times daily. 240 tablet 11  . Multiple Vitamin (MULTIVITAMIN) tablet Take 1 tablet by mouth daily.    Marland Kitchen OLANZapine (ZYPREXA) 20 MG tablet Take 20 mg by mouth at bedtime.    . Omega-3 Fatty Acids (FISH OIL PO) Take by mouth daily.    Marland Kitchen PRESCRIPTION MEDICATION Zolpidem tartrate 10 mg taking 1-1/2 tab in the pm    . ROPINIROLE HCL PO Take 1-2 mg by mouth at bedtime.     . traZODone (DESYREL) 100 MG tablet Take 100 mg by mouth at bedtime.    . SUMAtriptan (IMITREX) 100 MG tablet take 1 tablet as needed for headache, may repeat in 2 hours. 10 tablet 2   No current facility-administered medications on file prior to visit.    Review of Systems:  As per HPI- otherwise negative.   Physical Examination: Filed Vitals:   03/10/15 0931  BP: 114/75  Pulse: 85  Temp: 98.1 F (36.7 C)  Resp: 16   Filed Vitals:   03/10/15 0931  Height: 5'  6.5" (1.689 m)  Weight: 165 lb (74.844 kg)   Body mass index is 26.24 kg/(m^2). Ideal Body Weight: Weight in (lb) to have BMI = 25: 156.9  GEN: WDWN, NAD, Non-toxic, A & O x 3, mild overweight, looks well HEENT: Atraumatic, Normocephalic. Neck supple. No masses, No LAD.  Bilateral TM wnl, oropharynx normal.  PEERL,EOMI.   Ears and Nose: No external deformity. CV: RRR, No M/G/R. No JVD. No thrill. No extra heart sounds. PULM: CTA B, no wheezes, crackles, rhonchi. No retractions. No resp. distress. No accessory muscle use. ABD: S, NT, ND EXTR: No c/c/e NEURO Normal gait.  PSYCH: Normally interactive. Conversant. Not depressed or anxious appearing.  Calm demeanor.   Assessment and Plan: Physical exam  Seizure disorder  Mood disorder in conditions classified elsewhere  Medication monitoring encounter - Plan: CBC, Comprehensive metabolic panel, Lipid panel, TSH, Hemoglobin A1c  Here today for a CPE She is on antipsychotics which she knows can cause weight gain- she is watching this and exercisng Seizures and mood disorder under good control  Signed Lamar Blinks, MD  Called to go over labs 7/20:  Advised hat her sodium is a bit low.  Encouraged her to increase her salt intake and to drink some sports drink instead of plain water.  She will come in for a lab visit only in 1-2 weeks to recheck Discussed cholesterol- she is ok with starting a cholesterol med.  Will call this in for her.   She will recheck in 6 months   Results for orders placed or performed in visit on 03/10/15  CBC  Result Value Ref Range   WBC 5.2 4.0 - 10.5 K/uL   RBC 5.13 (H) 3.87 - 5.11 MIL/uL   Hemoglobin 14.5 12.0 - 15.0 g/dL   HCT 42.1 36.0 - 46.0 %   MCV 82.1 78.0 - 100.0 fL   MCH 28.3 26.0 - 34.0 pg   MCHC 34.4 30.0 - 36.0 g/dL   RDW 13.9 11.5 - 15.5 %   Platelets 243 150 - 400 K/uL   MPV 9.6 8.6 - 12.4 fL  Comprehensive metabolic panel  Result Value Ref Range   Sodium 131 (L) 135 - 145 mEq/L    Potassium 4.3 3.5 - 5.3 mEq/L   Chloride 96 96 - 112 mEq/L   CO2 24 19 - 32 mEq/L   Glucose, Bld  105 (H) 70 - 99 mg/dL   BUN 10 6 - 23 mg/dL   Creat 0.85 0.50 - 1.10 mg/dL   Total Bilirubin 0.4 0.2 - 1.2 mg/dL   Alkaline Phosphatase 122 (H) 39 - 117 U/L   AST 23 0 - 37 U/L   ALT 27 0 - 35 U/L   Total Protein 7.1 6.0 - 8.3 g/dL   Albumin 4.5 3.5 - 5.2 g/dL   Calcium 9.9 8.4 - 10.5 mg/dL  Lipid panel  Result Value Ref Range   Cholesterol 291 (H) 0 - 200 mg/dL   Triglycerides 223 (H) <150 mg/dL   HDL 56 >=46 mg/dL   Total CHOL/HDL Ratio 5.2 Ratio   VLDL 45 (H) 0 - 40 mg/dL   LDL Cholesterol 190 (H) 0 - 99 mg/dL  TSH  Result Value Ref Range   TSH 1.745 0.350 - 4.500 uIU/mL  Hemoglobin A1c  Result Value Ref Range   Hgb A1c MFr Bld 5.6 <5.7 %   Mean Plasma Glucose 114 <117 mg/dL

## 2015-03-10 NOTE — Patient Instructions (Signed)
Good to see you today! I will be in touch with your labs asap Take care

## 2015-03-12 ENCOUNTER — Encounter: Payer: Self-pay | Admitting: Family Medicine

## 2015-03-12 MED ORDER — LOVASTATIN 20 MG PO TABS: 20.0000 mg | ORAL_TABLET | Freq: Every day | ORAL | Status: DC

## 2015-03-12 NOTE — Addendum Note (Signed)
Addended by: Lamar Blinks C on: 03/12/2015 08:50 AM   Modules accepted: Orders

## 2015-03-17 ENCOUNTER — Other Ambulatory Visit: Payer: Self-pay | Admitting: Obstetrics and Gynecology

## 2015-03-17 DIAGNOSIS — N63 Unspecified lump in unspecified breast: Secondary | ICD-10-CM

## 2015-03-24 ENCOUNTER — Other Ambulatory Visit (INDEPENDENT_AMBULATORY_CARE_PROVIDER_SITE_OTHER): Payer: Medicare Other

## 2015-03-24 DIAGNOSIS — E871 Hypo-osmolality and hyponatremia: Secondary | ICD-10-CM | POA: Diagnosis not present

## 2015-03-24 LAB — BASIC METABOLIC PANEL
BUN: 8 mg/dL (ref 7–25)
CO2: 28 mmol/L (ref 20–31)
Calcium: 9.4 mg/dL (ref 8.6–10.4)
Chloride: 102 mmol/L (ref 98–110)
Creat: 0.85 mg/dL (ref 0.50–1.05)
Glucose, Bld: 89 mg/dL (ref 65–99)
Potassium: 4.3 mmol/L (ref 3.5–5.3)
Sodium: 138 mmol/L (ref 135–146)

## 2015-03-24 NOTE — Progress Notes (Signed)
Pt here for lab work only

## 2015-04-01 ENCOUNTER — Ambulatory Visit
Admission: RE | Admit: 2015-04-01 | Discharge: 2015-04-01 | Disposition: A | Discharge status: Home / Self Care | Payer: Medicare Other | Source: Ambulatory Visit | Attending: Obstetrics and Gynecology | Admitting: Obstetrics and Gynecology

## 2015-04-01 DIAGNOSIS — N63 Unspecified lump in unspecified breast: Secondary | ICD-10-CM

## 2015-04-15 ENCOUNTER — Other Ambulatory Visit (HOSPITAL_COMMUNITY)
Admission: RE | Admit: 2015-04-15 | Discharge: 2015-04-15 | Disposition: A | Discharge status: Home / Self Care | Payer: Medicare Other | Source: Ambulatory Visit | Attending: Obstetrics and Gynecology | Admitting: Obstetrics and Gynecology

## 2015-04-15 ENCOUNTER — Other Ambulatory Visit: Payer: Self-pay | Admitting: Obstetrics and Gynecology

## 2015-04-15 DIAGNOSIS — N63 Unspecified lump in breast: Secondary | ICD-10-CM | POA: Diagnosis not present

## 2015-04-15 DIAGNOSIS — Z01419 Encounter for gynecological examination (general) (routine) without abnormal findings: Secondary | ICD-10-CM | POA: Insufficient documentation

## 2015-04-16 LAB — CYTOLOGY - PAP

## 2015-05-15 ENCOUNTER — Telehealth: Payer: Self-pay | Admitting: *Deleted

## 2015-05-15 NOTE — Telephone Encounter (Signed)
Patient had labs and this is old order.

## 2015-05-15 NOTE — Telephone Encounter (Signed)
-----   Message from Hulan Saas, RN sent at 04/22/2015  4:06 PM EDT ----- No new GI ----- Message -----    From: Hulan Saas, RN    Sent: 05/15/2015      To: Hulan Saas, RN  Call and remind patient due for LFT for DB on 05/19/15. Lab in EP[facility].

## 2015-06-05 ENCOUNTER — Ambulatory Visit (INDEPENDENT_AMBULATORY_CARE_PROVIDER_SITE_OTHER): Payer: Medicare Other | Admitting: Emergency Medicine

## 2015-06-05 ENCOUNTER — Ambulatory Visit (INDEPENDENT_AMBULATORY_CARE_PROVIDER_SITE_OTHER): Payer: Medicare Other

## 2015-06-05 VITALS — BP 142/97 | HR 96 | Temp 98.3°F | Resp 20 | Ht 66.5 in | Wt 170.1 lb

## 2015-06-05 DIAGNOSIS — M25572 Pain in left ankle and joints of left foot: Secondary | ICD-10-CM

## 2015-06-05 DIAGNOSIS — S93602A Unspecified sprain of left foot, initial encounter: Secondary | ICD-10-CM | POA: Diagnosis not present

## 2015-06-05 MED ORDER — HYDROCODONE-ACETAMINOPHEN 5-325 MG PO TABS: 1.0000 | ORAL_TABLET | ORAL | Status: DC | PRN

## 2015-06-05 NOTE — Patient Instructions (Signed)

## 2015-06-05 NOTE — Progress Notes (Signed)
Subjective:  Patient ID: Theresa [name], female    DOB: 1956/03/05  Age: 59 y.o. MRN: [id number]  CC: Foot Injury   HPI Theresa [name] presents  with an injury to Theresa [name] left foot. She suffered an injured foot when she was walking and twisted Theresa [name] foot. She is been having increasing pain and decreasing mobility related the pain. She's had no swelling or ecchymosis. She has no ankle complaint.  History Theresa [name] has a past medical history of Seizures ([place]); Bipolar 1 disorder ([place]); Depression; Adenomatous colon polyp; Blood transfusion without reported diagnosis; Elevated LFTs; Hyperlipidemia; Hepatic steatosis (05/07/13); Allergy; and Anxiety.   She has past surgical history that includes Appendectomy; Dilation and curettage of uterus; and Colonoscopy (06/16/2012).   Theresa [name]  family history includes Cancer in Theresa [name] father and mother; Colon polyps in Theresa [name] mother; Heart disease in Theresa [name] mother. There is no history of Colon cancer or Stomach cancer.  She   reports that she has never smoked. She has never used smokeless tobacco. She reports that she drinks alcohol. She reports that she does not use illicit drugs.  Outpatient Prescriptions Prior to Visit  Medication Sig Dispense Refill  . asenapine (SAPHRIS) 5 MG SUBL Place 10-20 mg under the tongue at bedtime.    . Carbamazepine (EQUETRO) 300 MG CP12 Take by mouth.    . clonazePAM (KLONOPIN) 0.5 MG tablet Take 1 mg by mouth at bedtime as needed.     . cyclobenzaprine (FEXMID) 7.5 MG tablet Take 7.5 mg by mouth 3 (three) times daily as needed for muscle spasms.    Marland Kitchen levETIRAcetam (KEPPRA) 1000 MG tablet Take 2 tablets (2,000 mg total) by mouth 2 (two) times daily. 240 tablet 11  . lovastatin (MEVACOR) 20 MG tablet Take 1 tablet (20 mg total) by mouth daily. 90 tablet 1  . Multiple Vitamin (MULTIVITAMIN) tablet Take 1 tablet by mouth daily.    Marland Kitchen OLANZapine (ZYPREXA) 20 MG tablet Take 20 mg by mouth at bedtime.    . Omega-3 Fatty Acids (FISH OIL PO)  Take by mouth daily.    Marland Kitchen ROPINIROLE HCL PO Take 1-2 mg by mouth at bedtime.     . SUMAtriptan (IMITREX) 100 MG tablet take 1 tablet as needed for headache, may repeat in 2 hours. 10 tablet 2  . traZODone (DESYREL) 100 MG tablet Take 100 mg by mouth at bedtime.    Marland Kitchen PRESCRIPTION MEDICATION Zolpidem tartrate 10 mg taking 1-1/2 tab in the pm     No facility-administered medications prior to visit.    Social History   Social History  . Marital Status: Married    Spouse Name: N/A  . Number of Children: N/A  . Years of Education: N/A   Social History Main Topics  . Smoking status: Never Smoker   . Smokeless tobacco: Never Used  . Alcohol Use: Yes     Comment: occasionaly 1 a month  . Drug Use: No  . Sexual Activity: Not Currently   Other Topics Concern  . None   Social History Narrative   Married. Education: [employer]. Exercise: Yes     Review of Systems  Constitutional: Negative for fever, chills and appetite change.  HENT: Negative for congestion, ear pain, postnasal drip, sinus pressure and sore throat.   Eyes: Negative for pain and redness.  Respiratory: Negative for cough, shortness of breath and wheezing.   Cardiovascular: Negative for leg swelling.  Gastrointestinal: Negative for nausea, vomiting, abdominal pain, diarrhea, constipation and blood in stool.  Endocrine: Negative for polyuria.  Genitourinary: Negative for dysuria, urgency, frequency and flank pain.  Musculoskeletal: Negative for gait problem.  Skin: Negative for rash.  Neurological: Negative for weakness and headaches.  Psychiatric/Behavioral: Negative for confusion and decreased concentration. The patient is not nervous/anxious.     Objective:  BP 142/97 mmHg  Pulse 96  Temp(Src) 98.3 F (36.8 C) (Oral)  Resp 20  Ht 5' 6.5" (1.689 m)  Wt 170 lb 2 oz (77.168 kg)  BMI 27.05 kg/m2  SpO2 97%  Physical Exam  Constitutional: She is oriented to person, place, and time. She appears well-developed and  well-nourished.  HENT:  Head: Normocephalic and atraumatic.  Eyes: Conjunctivae are normal. Pupils are equal, round, and reactive to light.  Pulmonary/Chest: Effort normal.  Musculoskeletal: She exhibits no edema.       Right foot: There is decreased range of motion and tenderness.  Neurological: She is alert and oriented to person, place, and time.  Skin: Skin is dry.  Psychiatric: She has a normal mood and affect. Theresa [name] behavior is normal. Thought content normal.      Assessment & Plan:   Theresa [name] was seen today for foot injury.  Diagnoses and all orders for this visit:  Pain in joint, ankle and foot, left -     DG Foot Complete Left; Future  Sprain of foot, left, initial encounter  Other orders -     HYDROcodone-acetaminophen (NORCO) 5-325 MG tablet; Take 1-2 tablets by mouth every 4 (four) hours as needed.   I have discontinued Theresa [name]'s PRESCRIPTION MEDICATION. I am also having Theresa [name] start on HYDROcodone-acetaminophen. Additionally, I am having Theresa [name] maintain Theresa [name] levETIRAcetam, clonazePAM, SUMAtriptan, ROPINIROLE HCL PO, OLANZapine, asenapine, multivitamin, Omega-3 Fatty Acids (FISH OIL PO), Carbamazepine, traZODone, cyclobenzaprine, lovastatin, and zolpidem.  Meds ordered this encounter  Medications  . zolpidem (AMBIEN) 10 MG tablet    Sig:   . HYDROcodone-acetaminophen (NORCO) 5-325 MG tablet    Sig: Take 1-2 tablets by mouth every 4 (four) hours as needed.    Dispense:  30 tablet    Refill:  0   She was fitted with a walker boot and will follow-up in a week  Appropriate red flag conditions were discussed with the patient as well as actions that should be taken.  Patient expressed his understanding.  Follow-up: Return if symptoms worsen or fail to improve.  Roselee Culver, MD   [facility] reading (PRIMARY) by  Dr. Ouida Sills.  Neg .

## 2015-09-02 ENCOUNTER — Other Ambulatory Visit: Payer: Self-pay | Admitting: Family Medicine

## 2015-09-08 ENCOUNTER — Other Ambulatory Visit: Payer: Self-pay | Admitting: Obstetrics and Gynecology

## 2015-09-08 DIAGNOSIS — N63 Unspecified lump in unspecified breast: Secondary | ICD-10-CM

## 2015-09-12 ENCOUNTER — Encounter: Payer: Self-pay | Admitting: Family Medicine

## 2015-09-17 ENCOUNTER — Encounter: Payer: Self-pay | Admitting: Family Medicine

## 2015-09-19 ENCOUNTER — Ambulatory Visit (INDEPENDENT_AMBULATORY_CARE_PROVIDER_SITE_OTHER): Payer: Medicare Other | Admitting: Family Medicine

## 2015-09-19 VITALS — BP 134/92 | HR 72 | Temp 98.2°F | Resp 16 | Ht 67.0 in | Wt 160.4 lb

## 2015-09-19 DIAGNOSIS — Z5181 Encounter for therapeutic drug level monitoring: Secondary | ICD-10-CM

## 2015-09-19 DIAGNOSIS — R030 Elevated blood-pressure reading, without diagnosis of hypertension: Secondary | ICD-10-CM | POA: Diagnosis not present

## 2015-09-19 DIAGNOSIS — E785 Hyperlipidemia, unspecified: Secondary | ICD-10-CM | POA: Diagnosis not present

## 2015-09-19 DIAGNOSIS — IMO0001 Reserved for inherently not codable concepts without codable children: Secondary | ICD-10-CM

## 2015-09-19 LAB — LIPID PANEL
Cholesterol: 211 mg/dL — ABNORMAL HIGH (ref 125–200)
HDL: 61 mg/dL (ref >=46)
LDL Cholesterol: 129 mg/dL (ref <130)
Total CHOL/HDL Ratio: 3.5 Ratio (ref <=5.0)
Triglycerides: 103 mg/dL (ref <150)
VLDL: 21 mg/dL (ref <30)

## 2015-09-19 LAB — COMPREHENSIVE METABOLIC PANEL
ALT: 55 U/L — ABNORMAL HIGH (ref 6–29)
AST: 33 U/L (ref 10–35)
Albumin: 4.4 g/dL (ref 3.6–5.1)
Alkaline Phosphatase: 123 U/L (ref 33–130)
BUN: 14 mg/dL (ref 7–25)
CO2: 26 mmol/L (ref 20–31)
Calcium: 9.5 mg/dL (ref 8.6–10.4)
Chloride: 101 mmol/L (ref 98–110)
Creat: 0.91 mg/dL (ref 0.50–1.05)
Glucose, Bld: 102 mg/dL — ABNORMAL HIGH (ref 65–99)
Potassium: 4.4 mmol/L (ref 3.5–5.3)
Sodium: 137 mmol/L (ref 135–146)
Total Bilirubin: 0.4 mg/dL (ref 0.2–1.2)
Total Protein: 7 g/dL (ref 6.1–8.1)

## 2015-09-19 NOTE — Progress Notes (Signed)
[facility] [address] [place] [address] [phone]  Date:  09/19/2015   Name:  Theresa [name]   DOB:  15-Aug-1956   MRN:  [id number]  PCP:  Lamar Blinks, MD    Chief Complaint: Follow-up and Labs Only   History of Present Illness:  Theresa [name] is a 60 y.o. very pleasant female patient who presents with the following:  History of epilepsy, mental illness.  Here today for recheck labs and to follow-up.  She was told that her BP was a bit high at her psychiatry office recently as well.   She does get migraine HA once in a while, no change here. No chest pain.  No vision change.    BP Readings from Last 3 Encounters:  09/19/15 120/100  06/05/15 142/97  03/10/15 114/75   She declines a flu shot today.  She is fasting today  Wt Readings from Last 3 Encounters:  09/19/15 160 lb 6.4 oz (72.757 kg)  06/05/15 170 lb 2 oz (77.168 kg)  03/10/15 165 lb (74.844 kg)     Patient Active Problem List   Diagnosis Date Noted  . Special screening for malignant neoplasms, colon 06/16/2012  . Personal history of colonic polyps 06/16/2012  . Bipolar disorder ([place]) 04/02/2012  . 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 Seizures ([place])   . Bipolar 1 disorder ([place])   . Depression   . Adenomatous colon polyp   . Blood transfusion without reported diagnosis   . Elevated LFTs   . Hyperlipidemia   . Hepatic steatosis 05/07/13  . Allergy   . Anxiety     Past Surgical History  Procedure Laterality Date  . Appendectomy    . Dilation and curettage of uterus    . Colonoscopy  06/16/2012    Procedure: COLONOSCOPY;  Surgeon: Lafayette Dragon, MD;  Location: [facility];  Service: Endoscopy;  Laterality: N/A;    Social History  Substance Use Topics  . Smoking status: Never Smoker   . Smokeless tobacco: Never Used  . Alcohol Use: Yes     Comment: occasionaly 1 a month    Family History  Problem  Relation Age of Onset  . Colon polyps Mother   . Cancer Mother   . Heart disease Mother   . Colon cancer Neg Hx   . Stomach cancer Neg Hx   . Cancer Father     Allergies  Allergen Reactions  . Lamictal [Lamotrigine]     Acute renal failure  . Penicillins     Abdominal pain  . Codeine Swelling and Rash    Medication list has been reviewed and updated.  Current Outpatient Prescriptions on File Prior to Visit  Medication Sig Dispense Refill  . asenapine (SAPHRIS) 5 MG SUBL Place 10-20 mg under the tongue at bedtime.    . Carbamazepine (EQUETRO) 300 MG CP12 Take by mouth.    . clonazePAM (KLONOPIN) 0.5 MG tablet Take 1 mg by mouth at bedtime as needed.     . cyclobenzaprine (FEXMID) 7.5 MG tablet Take 7.5 mg by mouth 3 (three) times daily as needed for muscle spasms.    Marland Kitchen levETIRAcetam (KEPPRA) 1000 MG tablet Take 2 tablets (2,000 mg total) by mouth 2 (two) times daily. 240 tablet 11  . lovastatin (MEVACOR) 20 MG tablet TAKE 1 TABLET EVERY DAY 90 tablet 1  . Multiple Vitamin (MULTIVITAMIN) tablet Take 1 tablet by mouth  daily.    Marland Kitchen OLANZapine (ZYPREXA) 20 MG tablet Take 20 mg by mouth at bedtime.    . Omega-3 Fatty Acids (FISH OIL PO) Take by mouth daily.    Marland Kitchen ROPINIROLE HCL PO Take 1-2 mg by mouth at bedtime.     . traZODone (DESYREL) 100 MG tablet Take 100 mg by mouth at bedtime.    Marland Kitchen zolpidem (AMBIEN) 10 MG tablet     . HYDROcodone-acetaminophen (NORCO) 5-325 MG tablet Take 1-2 tablets by mouth every 4 (four) hours as needed. (Patient not taking: Reported on 09/19/2015) 30 tablet 0  . SUMAtriptan (IMITREX) 100 MG tablet take 1 tablet as needed for headache, may repeat in 2 hours. 10 tablet 2   No current facility-administered medications on file prior to visit.    Review of Systems:  As per HPI- otherwise negative. No seizures in 4-5 years now.  Physical Examination: Filed Vitals:   09/19/15 0933  BP: 120/100  Pulse: 71  Temp: 98.2 F (36.8 C)  Resp: 16   Filed  Vitals:   09/19/15 0933  Height: 5\' 7"  (1.702 m)  Weight: 160 lb 6.4 oz (72.757 kg)   Body mass index is 25.12 kg/(m^2). Ideal Body Weight: Weight in (lb) to have BMI = 25: 159.3  GEN: WDWN, NAD, Non-toxic, A & O x 3, looks well, minimal overweight, has lost 10 lbs HEENT: Atraumatic, Normocephalic. Neck supple. No masses, No LAD. Ears and Nose: No external deformity. CV: RRR, No M/G/R. No JVD. No thrill. No extra heart sounds. PULM: CTA B, no wheezes, crackles, rhonchi. No retractions. No resp. distress. No accessory muscle use. EXTR: No c/c/e NEURO Normal gait.  PSYCH: Normally interactive. Conversant. Not depressed or anxious appearing.  Calm demeanor.    Assessment and Plan: Hyperlipidemia - Plan: Lipid panel  Medication monitoring encounter - Plan: Comprehensive metabolic panel  Elevated BP  Check labs today She will monitor her BP at home and let me know if running higher than 135/90 on a regular basis Plan for recheck in 6 months   Signed Lamar Blinks, MD

## 2015-09-19 NOTE — Patient Instructions (Signed)
It was great to see you today- I will be in touch with your labs Please keep an eye on your BP; maybe check it once a week.  If you are running higher than 135/90 on a regular basis please let me know Otherwise let's plan to recheck in 6 months.  I am moving to a new office next month . . .  It would be my pleasure to continue to see you as a patient at my new office, starting the last week of February.  If you prefer to remain at [facility] one of my partners will be happy to see you here.   [facility] [address] [place], [place] [address] Phone: [phone]

## 2015-09-24 ENCOUNTER — Encounter: Payer: Self-pay | Admitting: Family Medicine

## 2015-10-10 ENCOUNTER — Ambulatory Visit
Admission: RE | Admit: 2015-10-10 | Discharge: 2015-10-10 | Disposition: A | Discharge status: Home / Self Care | Payer: Medicare Other | Source: Ambulatory Visit | Attending: Obstetrics and Gynecology | Admitting: Obstetrics and Gynecology

## 2015-10-10 DIAGNOSIS — N63 Unspecified lump in unspecified breast: Secondary | ICD-10-CM

## 2015-11-24 ENCOUNTER — Telehealth: Payer: Self-pay | Admitting: *Deleted

## 2015-11-24 DIAGNOSIS — R945 Abnormal results of liver function studies: Secondary | ICD-10-CM

## 2015-11-24 DIAGNOSIS — R7989 Other specified abnormal findings of blood chemistry: Secondary | ICD-10-CM

## 2015-11-24 NOTE — Telephone Encounter (Signed)
Spoke with patient's family. She would like Dr. Silverio Decamp as new GI. She is due for LFT's/yearly lab. Please, advise if she can have this or if she needs OV.

## 2015-11-25 NOTE — Telephone Encounter (Signed)
Ok to do labs and please also schedule for OV. Thanks

## 2015-11-26 NOTE — Telephone Encounter (Signed)
Patient will come for labs per husband. Scheduled OV on 01/30/16 at 2:30 PM.

## 2015-11-26 NOTE — Telephone Encounter (Signed)
Left a message for patient's husband to call back.  

## 2015-12-04 ENCOUNTER — Ambulatory Visit (INDEPENDENT_AMBULATORY_CARE_PROVIDER_SITE_OTHER): Payer: Medicare Other | Admitting: Family Medicine

## 2015-12-04 ENCOUNTER — Encounter: Payer: Self-pay | Admitting: Family Medicine

## 2015-12-04 VITALS — BP 126/90 | HR 88 | Temp 98.0°F | Resp 16 | Ht 67.0 in | Wt 163.0 lb

## 2015-12-04 DIAGNOSIS — H8110 Benign paroxysmal vertigo, unspecified ear: Secondary | ICD-10-CM

## 2015-12-04 MED ORDER — MECLIZINE HCL 12.5 MG PO TABS: 12.5000 mg | ORAL_TABLET | Freq: Three times a day (TID) | ORAL | Status: DC | PRN

## 2015-12-04 NOTE — Progress Notes (Signed)
Pre visit review using our clinic review tool, if applicable. No additional management support is needed unless otherwise documented below in the visit note. 

## 2015-12-04 NOTE — Progress Notes (Signed)
[place] at [facility] [address] [place], [place] [address] [phone]  Date:  12/04/2015   Name:  Theresa [name]   DOB:  15-Dec-1955   MRN:  [id number]  PCP:  Lamar Blinks, MD    Chief Complaint: Dizziness   History of Present Illness:  Theresa [name] is a 60 y.o. very pleasant female patient who presents with the following:  History of epilepsy, bipolar disorder. Here today with concern of dizziness for the last 4 days. She notes that she is more dizzy with movement of her head.  It was present when she awoke 4 days ago.  She has not had this in the past really.  Changing posiiton, rolling over, etc makes her worse. If she holds till again the dizziness will pass in a few seconds  No falls, no injury. Sh has been moving around very carefully to avoid falls  She has noted some nausea but no vomiting.  No double vision, no numbness or weakness, no slurred speech No tinnitus.  No change in vision or hearing, no HA.   No CP, no SOB  We have been keeping an eye on her BP as of late BP Readings from Last 3 Encounters:  12/04/15 126/90  09/19/15 134/92  06/05/15 142/97   She was in the mental health hospital from 3/27- 4/3; at [facility]. She was having suicidal ideation and decided to be admitted.  The only change that was made was adding melatonin Last seizure was 3/7- this was her first in 5 years.  This was thought due to poor sleep and led to worsening of her mental state and her hospital admit  She has not tried any medication for this so far.   Patient Active Problem List   Diagnosis Date Noted  . Special screening for malignant neoplasms, colon 06/16/2012  . Personal history of colonic polyps 06/16/2012  . Bipolar disorder ([place]) 04/02/2012  . 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 Seizures ([place])   . Bipolar 1 disorder ([place])   . Depression   .  Adenomatous colon polyp   . Blood transfusion without reported diagnosis   . Elevated LFTs   . Hyperlipidemia   . Hepatic steatosis 05/07/13  . Allergy   . Anxiety     Past Surgical History  Procedure Laterality Date  . Appendectomy    . Dilation and curettage of uterus    . Colonoscopy  06/16/2012    Procedure: COLONOSCOPY;  Surgeon: Lafayette Dragon, MD;  Location: [facility];  Service: Endoscopy;  Laterality: N/A;    Social History  Substance Use Topics  . Smoking status: Never Smoker   . Smokeless tobacco: Never Used  . Alcohol Use: Yes     Comment: occasionaly 1 a month    Family History  Problem Relation Age of Onset  . Colon polyps Mother   . Cancer Mother   . Heart disease Mother   . Colon cancer Neg Hx   . Stomach cancer Neg Hx   . Cancer Father     Allergies  Allergen Reactions  . Lamictal [Lamotrigine]     Acute renal failure  . Penicillins     Abdominal pain  . Codeine Swelling and Rash    Medication list has been reviewed and updated.  Current Outpatient Prescriptions on File Prior to Visit  Medication Sig Dispense Refill  .  Carbamazepine (EQUETRO) 300 MG CP12 Take 1 capsule by mouth 2 (two) times daily.     . Multiple Vitamin (MULTIVITAMIN) tablet Take 1 tablet by mouth daily.    Marland Kitchen OLANZapine (ZYPREXA) 20 MG tablet Take 20 mg by mouth at bedtime.    . Omeprazole (PRILOSEC PO) Take 20 mg by mouth daily.     Marland Kitchen ROPINIROLE HCL PO Take 3 mg by mouth at bedtime.     . SUMAtriptan (IMITREX) 100 MG tablet take 1 tablet as needed for headache, may repeat in 2 hours. 10 tablet 2  . traZODone (DESYREL) 100 MG tablet 1-2 tablets at bedtime as needed for sleep    . zolpidem (AMBIEN) 10 MG tablet     . asenapine (SAPHRIS) 5 MG SUBL Place 10-20 mg under the tongue at bedtime. Reported on 12/04/2015    . cyclobenzaprine (FEXMID) 7.5 MG tablet Take 7.5 mg by mouth 3 (three) times daily as needed for muscle spasms. Reported on 12/04/2015    . lovastatin (MEVACOR) 20  MG tablet TAKE 1 TABLET EVERY DAY (Patient not taking: Reported on 12/04/2015) 90 tablet 1   No current facility-administered medications on file prior to visit.    Review of Systems:  As per HPI- otherwise negative.   Physical Examination: Filed Vitals:   12/04/15 1300  BP: 126/90  Pulse: 88  Temp: 98 F (36.7 C)  Resp: 16   Filed Vitals:   12/04/15 1300  Height: 5\' 7"  (1.702 m)  Weight: 163 lb (73.936 kg)   Body mass index is 25.52 kg/(m^2). Ideal Body Weight: Weight in (lb) to have BMI = 25: 159.3  GEN: WDWN, NAD, Non-toxic, A & O x 3, appears her normal self. Here today with her husband HEENT: Atraumatic, Normocephalic. Neck supple. No masses, No LAD.  Bilateral TM wnl, oropharynx normal.  PEERL,EOMI.   Ears and Nose: No external deformity. CV: RRR, No M/G/R. No JVD. No thrill. No extra heart sounds. PULM: CTA B, no wheezes, crackles, rhonchi. No retractions. No resp. distress. No accessory muscle use. ABD: S, NT, ND EXTR: No c/c/e NEURO Normal gait but she is moving cautiously and uses her husband's elbow for longer distances. Normal strength, sensation and DTR of all extremities.  Normal strength and movement of facial muscles.   Negative dix- halpike in both directions. Normal RAM, normal romberg PSYCH: Normally interactive. Conversant. Not depressed or anxious appearing.  Calm demeanor.    Assessment and Plan: Benign paroxysmal positional vertigo, unspecified laterality - Plan: meclizine (ANTIVERT) 12.5 MG tablet  Here today with vertigo sx most consistent with BPPV.  Discussed in detail with pt and her husband. No other neurological sx to suggest a more serious etiology but she will seek care if any change or worsening of her symptoms. Will use a low dose of meclizine as needed as she is already on a number of sedating medications as baseline.    Meds ordered this encounter  Medications  . levETIRAcetam (KEPPRA) 500 MG tablet    Sig: Take 1,000 mg by mouth 2  (two) times daily.  . clonazePAM (KLONOPIN) 1 MG tablet    Sig: Take 1 mg by mouth 3 (three) times daily as needed for anxiety.  . benztropine (COGENTIN) 2 MG tablet    Sig: Take 2 mg by mouth 2 (two) times daily.  . Nutritional Supplements (RA MELATONIN/B-6 PO)    Sig: Take 1 tablet by mouth at bedtime as needed.  . Cholecalciferol (HM VITAMIN D3) 4000 units  CAPS    Sig: Take 4,000 Units by mouth daily.  . meclizine (ANTIVERT) 12.5 MG tablet    Sig: Take 1 tablet (12.5 mg total) by mouth 3 (three) times daily as needed for dizziness.    Dispense:  30 tablet    Refill:  0   She will let me know if sx are not resolved by next week- Sooner if worse.  See patient instructions for more details.          Signed Lamar Blinks, MD

## 2015-12-04 NOTE — Patient Instructions (Signed)
It seems that your dizziness is due to BPPV- benign paroxysmal positional vertigo This generally will pass in a few days to a week Let me know if your symptoms are not cleared up by next week.  Use the meclizine as needed every 8 hours or so If you get worse or have any other symptoms or change in your symptoms please let me know!

## 2015-12-08 ENCOUNTER — Other Ambulatory Visit (INDEPENDENT_AMBULATORY_CARE_PROVIDER_SITE_OTHER): Payer: Medicare Other

## 2015-12-08 DIAGNOSIS — R7989 Other specified abnormal findings of blood chemistry: Secondary | ICD-10-CM

## 2015-12-08 DIAGNOSIS — R945 Abnormal results of liver function studies: Secondary | ICD-10-CM

## 2015-12-08 LAB — HEPATIC FUNCTION PANEL
ALT: 29 U/L (ref 0–35)
AST: 22 U/L (ref 0–37)
Albumin: 4.2 g/dL (ref 3.5–5.2)
Alkaline Phosphatase: 117 U/L (ref 39–117)
Bilirubin, Direct: 0.1 mg/dL (ref 0.0–0.3)
Total Bilirubin: 0.3 mg/dL (ref 0.2–1.2)
Total Protein: 6.8 g/dL (ref 6.0–8.3)

## 2015-12-10 ENCOUNTER — Telehealth: Payer: Self-pay | Admitting: Family Medicine

## 2015-12-10 ENCOUNTER — Telehealth: Payer: Self-pay

## 2015-12-10 NOTE — Telephone Encounter (Signed)
Can be reached: [phone] Pharmacy: [facility] [address]  Reason for call: Pt called in due to constipation. She has not had a bowel movement in 5 days. Doculax, miralax, suppository, drank olive oil and is not getting relief. Pt would like recommendation.

## 2015-12-10 NOTE — Telephone Encounter (Signed)
Called the patient to advise her of the lab results. She mentions she is constipated and has not had a normal bowel movement in about 7 days. She previously had relief with MOM and Ducolax, but these are now ineffective. She has not taken anything in the past 24 hours. Recommended Miralax purge of 32 ounces of fluid with 7 capfuls of Miralax. Drink 1 glass (about 8 oz) every 15 minutes until gone. Understands this may make her feel bloated, she will not pass any solid stool and it may take 24 hours to produce results.

## 2015-12-10 NOTE — Telephone Encounter (Signed)
Her GI doctor happened to call her today to go over some results and gave her a recommendation. She will let me know if she needs anything further

## 2016-01-13 ENCOUNTER — Telehealth: Payer: Self-pay | Admitting: Family Medicine

## 2016-01-13 NOTE — Telephone Encounter (Signed)
Caller name: Self  Can be reached: [phone]    Reason for call: FYI:  Patient calling to let provider know that insurance company will be sending a form to be completed for her to continue being seen by Psychologist.

## 2016-01-13 NOTE — Telephone Encounter (Signed)
Pt called back stating she does not need the referral for psychology. She will not be sending form.

## 2016-01-29 DIAGNOSIS — R413 Other amnesia: Secondary | ICD-10-CM | POA: Diagnosis not present

## 2016-01-30 ENCOUNTER — Ambulatory Visit (INDEPENDENT_AMBULATORY_CARE_PROVIDER_SITE_OTHER): Payer: Medicare Other | Admitting: Gastroenterology

## 2016-01-30 ENCOUNTER — Encounter: Payer: Self-pay | Admitting: Gastroenterology

## 2016-01-30 VITALS — BP 118/80 | HR 80 | Ht 66.25 in | Wt 166.5 lb

## 2016-01-30 DIAGNOSIS — K760 Fatty (change of) liver, not elsewhere classified: Secondary | ICD-10-CM

## 2016-01-30 NOTE — Patient Instructions (Signed)
Please follow up with Dr. Silverio Decamp as needed.

## 2016-02-02 ENCOUNTER — Ambulatory Visit (INDEPENDENT_AMBULATORY_CARE_PROVIDER_SITE_OTHER): Payer: Medicare Other | Admitting: Family Medicine

## 2016-02-02 ENCOUNTER — Ambulatory Visit (HOSPITAL_BASED_OUTPATIENT_CLINIC_OR_DEPARTMENT_OTHER)
Admission: RE | Admit: 2016-02-02 | Discharge: 2016-02-02 | Disposition: A | Discharge status: Home / Self Care | Payer: Medicare Other | Source: Ambulatory Visit | Attending: Family Medicine | Admitting: Family Medicine

## 2016-02-02 ENCOUNTER — Encounter: Payer: Self-pay | Admitting: Family Medicine

## 2016-02-02 VITALS — BP 130/86 | HR 84 | Temp 98.6°F | Ht 66.0 in | Wt 164.8 lb

## 2016-02-02 DIAGNOSIS — E785 Hyperlipidemia, unspecified: Secondary | ICD-10-CM | POA: Diagnosis not present

## 2016-02-02 DIAGNOSIS — S8992XA Unspecified injury of left lower leg, initial encounter: Secondary | ICD-10-CM | POA: Diagnosis not present

## 2016-02-02 DIAGNOSIS — M25562 Pain in left knee: Secondary | ICD-10-CM | POA: Diagnosis not present

## 2016-02-02 DIAGNOSIS — S8002XA Contusion of left knee, initial encounter: Secondary | ICD-10-CM

## 2016-02-02 DIAGNOSIS — W19XXXA Unspecified fall, initial encounter: Secondary | ICD-10-CM | POA: Insufficient documentation

## 2016-02-02 LAB — LIPID PANEL
Cholesterol: 296 mg/dL — ABNORMAL HIGH (ref 0–200)
HDL: 70.3 mg/dL (ref >39.00)
LDL Cholesterol: 192 mg/dL — ABNORMAL HIGH (ref 0–99)
NonHDL: 225.63
Total CHOL/HDL Ratio: 4
Triglycerides: 167 mg/dL — ABNORMAL HIGH (ref 0.0–149.0)
VLDL: 33.4 mg/dL (ref 0.0–40.0)

## 2016-02-02 MED ORDER — HYDROCODONE-ACETAMINOPHEN 5-325 MG PO TABS: 1.0000 | ORAL_TABLET | Freq: Three times a day (TID) | ORAL | Status: DC | PRN

## 2016-02-02 NOTE — Progress Notes (Signed)
[place] at [facility] [address] [place], [place] [address] [phone]  Date:  02/02/2016   Name:  Theresa [name]   DOB:  22-Aug-1956   MRN:  [id number]  PCP:  Lamar Blinks, MD    Chief Complaint: Knee Pain   History of Present Illness:  Theresa [name] is a 60 y.o. very pleasant female patient who presents with the following:  History of bipolar disorder and epilepsy.  Here today following a fall- yesterday she fell onto her left knee.  She fell onto a grassy, gravel driveway; she was tripped by one of their dogs  She is not sure if she heard or just felt a pop in her knee She is able to walk but is limping The knee is not getting stuck, clicking or popping excessively  She does feel unstable at first when she gets up from sitting.  However once she gets going she is able to walk ok She did catch herself on her left hand- it is feeling better however  Her vertigo is much better and did not contribute to this fall.    She has been able to use hydrocodone in the recent past for pain- she cannot use codeine however  OW unhurt and feeling well today  Patient Active Problem List   Diagnosis Date Noted  . Special screening for malignant neoplasms, colon 06/16/2012  . Personal history of colonic polyps 06/16/2012  . Bipolar disorder ([place]) 04/02/2012  . Idiopathic generalized epilepsy ([place]) 10/15/2011    Past Medical History  Diagnosis Date  . Seizures ([place])   . Bipolar 1 disorder ([place])   . Depression   . Adenomatous colon polyp   . Blood transfusion without reported diagnosis   . Elevated LFTs   . Hyperlipidemia   . Hepatic steatosis 05/07/13  . Allergy   . Anxiety     Past Surgical History  Procedure Laterality Date  . Appendectomy    . Dilation and curettage of uterus    . Colonoscopy  06/16/2012    Procedure: COLONOSCOPY;  Surgeon: Lafayette Dragon, MD;  Location: [facility];  Service: Endoscopy;   Laterality: N/A;    Social History  Substance Use Topics  . Smoking status: Never Smoker   . Smokeless tobacco: Never Used  . Alcohol Use: Yes     Comment: occasionaly 1 a month    Family History  Problem Relation Age of Onset  . Colon polyps Mother   . Cancer Mother   . Heart disease Mother   . Colon cancer Neg Hx   . Stomach cancer Neg Hx   . Cancer Father     Allergies  Allergen Reactions  . Lamictal [Lamotrigine]     Acute renal failure  . Penicillins     Abdominal pain  . Codeine Swelling and Rash    Medication list has been reviewed and updated.  Current Outpatient Prescriptions on File Prior to Visit  Medication Sig Dispense Refill  . asenapine (SAPHRIS) 5 MG SUBL Place 10-20 mg under the tongue at bedtime. Reported on 01/30/2016    . benztropine (COGENTIN) 2 MG tablet Take 2 mg by mouth 2 (two) times daily.    . Carbamazepine (EQUETRO) 300 MG CP12 Take 1 capsule by mouth 2 (two) times daily.     . Cholecalciferol (HM VITAMIN D3) 4000 units CAPS Take 4,000 Units by mouth daily.    . clonazePAM (KLONOPIN) 1 MG tablet  Take 1 mg by mouth 3 (three) times daily as needed for anxiety.    . cyclobenzaprine (FEXMID) 7.5 MG tablet Take 7.5 mg by mouth 3 (three) times daily as needed for muscle spasms. Reported on 01/30/2016    . levETIRAcetam (KEPPRA) 500 MG tablet Take 1,000 mg by mouth 2 (two) times daily.    Marland Kitchen lovastatin (MEVACOR) 20 MG tablet TAKE 1 TABLET EVERY DAY 90 tablet 1  . meclizine (ANTIVERT) 12.5 MG tablet Take 1 tablet (12.5 mg total) by mouth 3 (three) times daily as needed for dizziness. 30 tablet 0  . Melatonin 3 MG TABS Take 1 tablet by mouth as needed. Reported on 01/30/2016    . Multiple Vitamin (MULTIVITAMIN) tablet Take 1 tablet by mouth daily.    Marland Kitchen OLANZapine (ZYPREXA) 20 MG tablet Take 20 mg by mouth at bedtime.    . Omeprazole (PRILOSEC PO) Take 20 mg by mouth daily.     Marland Kitchen ROPINIROLE HCL PO Take 3 mg by mouth at bedtime.     . traZODone (DESYREL) 100  MG tablet 1-2 tablets at bedtime as needed for sleep    . zolpidem (AMBIEN) 10 MG tablet Take 10 mg by mouth at bedtime.     . SUMAtriptan (IMITREX) 100 MG tablet take 1 tablet as needed for headache, may repeat in 2 hours. 10 tablet 2   No current facility-administered medications on file prior to visit.    Review of Systems:  As per HPI- otherwise negative.   Physical Examination: Filed Vitals:   02/02/16 1026  BP: 130/86  Pulse: 84  Temp: 98.6 F (37 C)   Filed Vitals:   02/02/16 1026  Height: 5\' 6"  (1.676 m)  Weight: 164 lb 12.8 oz (74.753 kg)   Body mass index is 26.61 kg/(m^2). Ideal Body Weight: Weight in (lb) to have BMI = 25: 154.6  GEN: WDWN, NAD, Non-toxic, A & O x 3, mild overweight, looks well HEENT: Atraumatic, Normocephalic. Neck supple. No masses, No LAD. Ears and Nose: No external deformity. CV: RRR, No M/G/R. No JVD. No thrill. No extra heart sounds. PULM: CTA B, no wheezes, crackles, rhonchi. No retractions. No resp. distress. No accessory muscle use. EXTR: No c/c/e NEURO Normal gait.  PSYCH: Normally interactive. Conversant. Not depressed or anxious appearing.  Calm demeanor.  Left knee:  Mild medial joint line pain. Patella is non- tender. No effusion, heat or bruise.  Pain with knee flexion and extension.  Ligaments are stable.  Left wrist is non- tender with normal ROM  Dg Knee Complete 4 Views Left  02/02/2016  CLINICAL DATA:  60 year old female status post fall onto left knee yesterday with pain. Initial encounter. EXAM: LEFT KNEE - COMPLETE 4+ VIEW COMPARISON:  None. FINDINGS: Bone mineralization is within normal limits. Patella intact. Joint spaces and alignment are preserved. No definite joint effusion. No acute osseous abnormality identified. IMPRESSION: No acute fracture or dislocation identified about the left knee. Electronically Signed   By: Genevie Ann M.D.   On: 02/02/2016 12:30     Assessment and Plan: Knee contusion, left, initial  encounter - Plan: HYDROcodone-acetaminophen (NORCO/VICODIN) 5-325 MG tablet, DG Knee Complete 4 Views Left  Dyslipidemia - Plan: Lipid panel  Left knee pain - Plan: HYDROcodone-acetaminophen (NORCO/VICODIN) 5-325 MG tablet  She has been dieting and wants to recheck her cholesterol today  Please go to lab and then downstairs for your x-ray.  Then you may head home; I'll be in touch with your knee x-ray  results when they come in.  Use your knee sleeve as needed; if you feel like you need more support you might try an OTC hinged knee brace as needed Also ice and elevate your knee as is practical, try to take it easy  We will check on your cholesterol today so you won't have to come back next week!  If you knee continues to bother you a lot over the next week or so please let me know, even if your x-rays look ok  I would suggest that you pick up some inexpensive crutches at the drug store- sorry we do not have a pair to fit you today! Finally, you can use the hydrocodone as needed for your knee pain.  You are on a few sedating medications already; if you need to take this med at bedtime (wiht the trazodone/ ambien) please take only a half pill.  Otherwise use caution as you may feel more sedated with the pain medication   Signed Lamar Blinks, MD

## 2016-02-02 NOTE — Progress Notes (Signed)
Theresa [name]    [id number]    04-04-1956  Primary Care Physician:COPLAND,JESSICA, MD  Referring Physician: Darreld Mclean, MD [address] [place], [place] [address]  Chief complaint:  Fatty liver, abnormal LFT HPI: 60 year old white female with history of epilepsy and bipolar disorder here for follow-up visit fatty liver and history of mild elevation of transaminase. In June 2014, her AST was 44 and ALT was 71. On 04/09/2013, her AST was 41 and ALT was 64 with an albumin of 4.6 and alkaline phosphatase of 110. Her bilirubin was normal. Hepatitis serologies A,B and C were all negative. She has a history of bipolar disorder, post traumatic stress syndrome, and idiopathic generalized epilepsy. She has been on anticonvulsive medications. She was initially on Depakote from 2003 to 2011 and it was discontinued because of liver abnormalities per patient but we don't have the documentation. She has been on Keppra 1000 mg 2 tablets twice a day since then. She had a blood transfusion in 1981 during miscarriage. There was no family history of cirrhosis. She does not drink any alcohol. She denies easy bruising or swelling.  An upper abdominal ultrasound in September 2014 showed hepatic steatosis and 2 hypoechoic lesions; 1x0.9x0.9 cm and another one 1x0.8x1 cm too small to characterize, likely cystic.  She has lost some weight after she followed vigorous diet with no carbohydrates and sugars and is exercising regularly. Her most recent AST is 22 and ALT 29 in April 2017, and have significantly improved compared to prior. Denies any nausea, vomiting, abdominal pain, melena or bright red blood per rectum   Outpatient Encounter Prescriptions as of 01/30/2016  Medication Sig  . benztropine (COGENTIN) 2 MG tablet Take 2 mg by mouth 2 (two) times daily.  . Carbamazepine (EQUETRO) 300 MG CP12 Take 1 capsule by mouth 2 (two) times daily.   . Cholecalciferol (HM VITAMIN D3) 4000  units CAPS Take 4,000 Units by mouth daily.  . clonazePAM (KLONOPIN) 1 MG tablet Take 1 mg by mouth 3 (three) times daily as needed for anxiety.  . levETIRAcetam (KEPPRA) 500 MG tablet Take 1,000 mg by mouth 2 (two) times daily.  . meclizine (ANTIVERT) 12.5 MG tablet Take 1 tablet (12.5 mg total) by mouth 3 (three) times daily as needed for dizziness.  . Multiple Vitamin (MULTIVITAMIN) tablet Take 1 tablet by mouth daily.  Marland Kitchen OLANZapine (ZYPREXA) 20 MG tablet Take 20 mg by mouth at bedtime.  . Omeprazole (PRILOSEC PO) Take 20 mg by mouth daily.   Marland Kitchen ROPINIROLE HCL PO Take 3 mg by mouth at bedtime.   . SUMAtriptan (IMITREX) 100 MG tablet take 1 tablet as needed for headache, may repeat in 2 hours.  . traZODone (DESYREL) 100 MG tablet 1-2 tablets at bedtime as needed for sleep  . zolpidem (AMBIEN) 10 MG tablet Take 10 mg by mouth at bedtime.   Marland Kitchen asenapine (SAPHRIS) 5 MG SUBL Place 10-20 mg under the tongue at bedtime. Reported on 01/30/2016  . cyclobenzaprine (FEXMID) 7.5 MG tablet Take 7.5 mg by mouth 3 (three) times daily as needed for muscle spasms. Reported on 01/30/2016  . lovastatin (MEVACOR) 20 MG tablet TAKE 1 TABLET EVERY DAY  . Melatonin 3 MG TABS Take 1 tablet by mouth as needed. Reported on 01/30/2016  . [DISCONTINUED] Nutritional Supplements (RA MELATONIN/B-6 PO) Take 1 tablet by mouth at bedtime as needed.   No facility-administered encounter medications on file as  of 01/30/2016.    Allergies as of 01/30/2016 - Review Complete 01/30/2016  Allergen Reaction Noted  . Lamictal [lamotrigine]  10/15/2011  . Penicillins  10/15/2011  . Codeine Swelling and Rash 10/15/2011    Past Medical History  Diagnosis Date  . Seizures ([place])   . Bipolar 1 disorder ([place])   . Depression   . Adenomatous colon polyp   . Blood transfusion without reported diagnosis   . Elevated LFTs   . Hyperlipidemia   . Hepatic steatosis 05/07/13  . Allergy   . Anxiety     Past Surgical History  Procedure  Laterality Date  . Appendectomy    . Dilation and curettage of uterus    . Colonoscopy  06/16/2012    Procedure: COLONOSCOPY;  Surgeon: Lafayette Dragon, MD;  Location: [facility];  Service: Endoscopy;  Laterality: N/A;    Family History  Problem Relation Age of Onset  . Colon polyps Mother   . Cancer Mother   . Heart disease Mother   . Colon cancer Neg Hx   . Stomach cancer Neg Hx   . Cancer Father     Social History   Social History  . Marital Status: Married    Spouse Name: N/A  . Number of Children: N/A  . Years of Education: N/A   Occupational History  . Not on file.   Social History Main Topics  . Smoking status: Never Smoker   . Smokeless tobacco: Never Used  . Alcohol Use: Yes     Comment: occasionaly 1 a month  . Drug Use: No  . Sexual Activity: Not Currently   Other Topics Concern  . Not on file   Social History Narrative   Married. Education: [employer]. Exercise: Yes      Review of systems: Review of Systems  Constitutional: Negative for fever and chills.  HENT: Negative.   Eyes: Negative for blurred vision.  Respiratory: Negative for cough, shortness of breath and wheezing.   Cardiovascular: Negative for chest pain and palpitations.  Gastrointestinal: as per HPI Genitourinary: Negative for dysuria, urgency, frequency and hematuria.  Musculoskeletal: Negative for myalgias, back pain and joint pain.  Skin: Negative for itching and rash.  Neurological: Negative for dizziness, tremors, focal weakness, seizures and loss of consciousness.  Endo/Heme/Allergies: Negative for environmental allergies.  Psychiatric/Behavioral: Negative for depression, suicidal ideas and hallucinations.  All other systems reviewed and are negative.   Physical Exam: Filed Vitals:   01/30/16 1435  BP: 118/80  Pulse: 80   Gen:      No acute distress HEENT:  EOMI, sclera anicteric Neck:     No masses; no thyromegaly Lungs:    Clear to auscultation bilaterally; normal  respiratory effort CV:         Regular rate and rhythm; no murmurs Abd:      + bowel sounds; soft, non-tender; no palpable masses, no distension Ext:    No edema; adequate peripheral perfusion Skin:      Warm and dry; no rash Neuro: alert and oriented x 3 Psych: normal mood and affect  Data Reviewed:  Reviewed chart in epic   Assessment and Plan/Recommendations:  60 year old female with history of epilepsy and bipolar disorder here for follow-up visit of abnormal LFT which have improved with lifestyle changes. Patient likely had elevated AST/ ALT in the setting of fatty liver with metabolic syndrome and probably Depakote also played some role. Her LFTs have normalized. No evidence of viral hepatitis or autoimmune liver disease  based on workup she had so far. Advise patient to continue lifestyle changes with decreased amount of sugar and carbohydrates Avoid alcohol, NSAID's or over-the-counter herbal remedies Return as needed    K. Denzil Magnuson , MD [phone] Mon-Fri 8a-5p [phone] after 5p, weekends, holidays  CC: Copland, Gay Filler, MD

## 2016-02-02 NOTE — Progress Notes (Signed)
Pre visit review using our clinic review tool, if applicable. No additional management support is needed unless otherwise documented below in the visit note. 

## 2016-02-02 NOTE — Patient Instructions (Addendum)
Please go to lab and then downstairs for your x-ray.  Then you may head home; I'll be in touch with your knee x-ray results when they come in.  Use your knee sleeve as needed; if you feel like you need more support you might try an OTC hinged knee brace as needed Also ice and elevate your knee as is practical, try to take it easy  We will check on your cholesterol today so you won't have to come back next week!  If you knee continues to bother you a lot over the next week or so please let me know, even if your x-rays look ok  I would suggest that you pick up some inexpensive crutches at the drug store- sorry we do not have a pair to fit you today! Finally, you can use the hydrocodone as needed for your knee pain.  You are on a few sedating medications already; if you need to take this med at bedtime (wiht the trazodone/ ambien) please take only a half pill.  Otherwise use caution as you may feel more sedated with the pain medication

## 2016-02-06 ENCOUNTER — Encounter: Payer: Self-pay | Admitting: Family Medicine

## 2016-02-06 DIAGNOSIS — M25562 Pain in left knee: Secondary | ICD-10-CM

## 2016-02-06 NOTE — Addendum Note (Signed)
Addended by: Lamar Blinks C on: 02/06/2016 06:29 PM   Modules accepted: Orders

## 2016-02-11 DIAGNOSIS — M25562 Pain in left knee: Secondary | ICD-10-CM | POA: Diagnosis not present

## 2016-02-12 ENCOUNTER — Ambulatory Visit: Payer: Medicare Other | Admitting: Family Medicine

## 2016-02-16 DIAGNOSIS — R413 Other amnesia: Secondary | ICD-10-CM | POA: Diagnosis not present

## 2016-02-18 DIAGNOSIS — M25562 Pain in left knee: Secondary | ICD-10-CM | POA: Diagnosis not present

## 2016-02-19 ENCOUNTER — Other Ambulatory Visit: Payer: Self-pay | Admitting: Orthopedic Surgery

## 2016-02-19 DIAGNOSIS — M25562 Pain in left knee: Secondary | ICD-10-CM

## 2016-03-01 DIAGNOSIS — M25562 Pain in left knee: Secondary | ICD-10-CM | POA: Diagnosis not present

## 2016-03-02 ENCOUNTER — Telehealth: Payer: Self-pay

## 2016-03-02 NOTE — Telephone Encounter (Signed)
[facility] [place] on [facility]th and 10th with no reply regarding patients request for refills   lovastatin (MEVACOR) 20 MG tablet   [facility] [address]   [phone]

## 2016-03-04 ENCOUNTER — Encounter: Payer: Self-pay | Admitting: Family Medicine

## 2016-03-04 ENCOUNTER — Telehealth: Payer: Self-pay | Admitting: Emergency Medicine

## 2016-03-04 DIAGNOSIS — M23331 Other meniscus derangements, other medial meniscus, right knee: Secondary | ICD-10-CM | POA: Diagnosis not present

## 2016-03-04 MED ORDER — LOVASTATIN 20 MG PO TABS: 20.0000 mg | ORAL_TABLET | Freq: Every day | ORAL | Status: DC

## 2016-03-04 NOTE — Telephone Encounter (Signed)
We did not get any [facility]s [facility] before this call. They probably sent it to Dr Copland's new [facility] fax. I will give 1 RF for since pt was seen here last for chol. It looks like she is going to Dr Lorelei Pont now though.

## 2016-03-10 ENCOUNTER — Telehealth: Payer: Self-pay | Admitting: *Deleted

## 2016-03-10 NOTE — Telephone Encounter (Signed)
Forwarded to Dr. Lorelei Pont

## 2016-03-11 ENCOUNTER — Encounter: Payer: Self-pay | Admitting: Family Medicine

## 2016-03-11 ENCOUNTER — Other Ambulatory Visit: Payer: Self-pay | Admitting: Emergency Medicine

## 2016-03-11 NOTE — Telephone Encounter (Signed)
Pt called in to follow up on form. Informed of the current status. (below) pt says that she is in pain and would like to have completed as soon as possible and sent back. She says that surgeon has a date in August. If it's completed soon she can schedule an appt soon .Marland Kitchen

## 2016-03-11 NOTE — Telephone Encounter (Signed)
Called her back and LMOM_ the only thing she needs is an EKG.  Would she be opposed to coming in to have this done? [place]

## 2016-03-11 NOTE — Telephone Encounter (Signed)
Received message from pt regarding the form she needs filled out.

## 2016-03-12 NOTE — Telephone Encounter (Signed)
Needs EKG for surgical clearance. Please call Pt and schedule appt w/ PCP (30 minutes). Thank you.

## 2016-03-12 NOTE — Telephone Encounter (Signed)
Pt has been scheduled.  °

## 2016-03-15 ENCOUNTER — Ambulatory Visit (INDEPENDENT_AMBULATORY_CARE_PROVIDER_SITE_OTHER): Payer: Medicare Other | Admitting: Family Medicine

## 2016-03-15 ENCOUNTER — Encounter: Payer: Self-pay | Admitting: Family Medicine

## 2016-03-15 VITALS — BP 122/85 | HR 93 | Temp 98.5°F | Ht 66.0 in | Wt 153.4 lb

## 2016-03-15 DIAGNOSIS — Z01818 Encounter for other preprocedural examination: Secondary | ICD-10-CM | POA: Diagnosis not present

## 2016-03-15 DIAGNOSIS — Z01810 Encounter for preprocedural cardiovascular examination: Secondary | ICD-10-CM | POA: Diagnosis not present

## 2016-03-15 LAB — COMPREHENSIVE METABOLIC PANEL
ALT: 70 U/L — ABNORMAL HIGH (ref 0–35)
AST: 37 U/L (ref 0–37)
Albumin: 4.5 g/dL (ref 3.5–5.2)
Alkaline Phosphatase: 112 U/L (ref 39–117)
BUN: 12 mg/dL (ref 6–23)
CO2: 32 mEq/L (ref 19–32)
Calcium: 9.9 mg/dL (ref 8.4–10.5)
Chloride: 98 mEq/L (ref 96–112)
Creatinine, Ser: 0.86 mg/dL (ref 0.40–1.20)
GFR: 71.55 mL/min (ref >60.00)
Glucose, Bld: 101 mg/dL — ABNORMAL HIGH (ref 70–99)
Potassium: 4.7 mEq/L (ref 3.5–5.1)
Sodium: 136 mEq/L (ref 135–145)
Total Bilirubin: 0.3 mg/dL (ref 0.2–1.2)
Total Protein: 7 g/dL (ref 6.0–8.3)

## 2016-03-15 LAB — CBC
HCT: 43.6 % (ref 36.0–46.0)
Hemoglobin: 14.4 g/dL (ref 12.0–15.0)
MCHC: 33.1 g/dL (ref 30.0–36.0)
MCV: 86.8 fl (ref 78.0–100.0)
Platelets: 224 10*3/uL (ref 150.0–400.0)
RBC: 5.03 Mil/uL (ref 3.87–5.11)
RDW: 13.3 % (ref 11.5–15.5)
WBC: 7.6 10*3/uL (ref 4.0–10.5)

## 2016-03-15 LAB — PROTIME-INR
INR: 1 ratio (ref 0.8–1.0)
Prothrombin Time: 10.8 s (ref 9.6–13.1)

## 2016-03-15 NOTE — Progress Notes (Signed)
Pre visit review using our clinic review tool, if applicable. No additional management support is needed unless otherwise documented below in the visit note. 

## 2016-03-15 NOTE — Patient Instructions (Signed)
I will fax in your clearance form today Please let me know if I can do anything further to assist you prior to your operation Great to see you today as always!

## 2016-03-15 NOTE — Progress Notes (Signed)
[place] at [facility] [address], [place], [place] [address] [phone]  Date:  03/15/2016   Name:  Theresa [name]   DOB:  May 15, 1956   MRN:  [id number]  PCP:  Lamar Blinks, MD    Chief Complaint: Surgical Clearance (Pt needs surgical clearance for left knee surgery. )   History of Present Illness:  Theresa [name] is a 60 y.o. very pleasant female patient who presents with the following:  She is having a knee operation- left knee scope, date of procedure TBD. Needs pre-op screening; asked to come in as she has never had an EKG She is on several medications for his history of seizure disorder and bipolar disorder.  However she is otherwise generally in very good health, does take a medication for her cholesterol Currently her knee is holding her back, but in the recent past she was able to walk 2-3 miles, climb stairs, clean the house without any CP or SOB She does not have any known heart of lung problems.   BP Readings from Last 3 Encounters:  03/15/16 122/85  02/02/16 130/86  01/30/16 118/80     Patient Active Problem List   Diagnosis Date Noted  . Special screening for malignant neoplasms, colon 06/16/2012  . Personal history of colonic polyps 06/16/2012  . Bipolar disorder ([place]) 04/02/2012  . Idiopathic generalized epilepsy ([place]) 10/15/2011    Past Medical History:  Diagnosis Date  . Adenomatous colon polyp   . Allergy   . Anxiety   . Bipolar 1 disorder ([place])   . Blood transfusion without reported diagnosis   . Depression   . Elevated LFTs   . Hepatic steatosis 05/07/13  . Hyperlipidemia   . Seizures ([place])     Past Surgical History:  Procedure Laterality Date  . APPENDECTOMY    . COLONOSCOPY  06/16/2012   Procedure: COLONOSCOPY;  Surgeon: Lafayette Dragon, MD;  Location: [facility];  Service: Endoscopy;  Laterality: N/A;  . DILATION AND CURETTAGE OF UTERUS      Social History  Substance Use  Topics  . Smoking status: Never Smoker  . Smokeless tobacco: Never Used  . Alcohol use Yes     Comment: occasionaly 1 a month    Family History  Problem Relation Age of Onset  . Colon polyps Mother   . Cancer Mother   . Heart disease Mother   . Colon cancer Neg Hx   . Stomach cancer Neg Hx   . Cancer Father     Allergies  Allergen Reactions  . Lamictal [Lamotrigine]     Acute renal failure  . Penicillins     Abdominal pain  . Codeine Swelling and Rash    Medication list has been reviewed and updated.  Current Outpatient Prescriptions on File Prior to Visit  Medication Sig Dispense Refill  . asenapine (SAPHRIS) 5 MG SUBL Place 10-20 mg under the tongue at bedtime. Reported on 01/30/2016    . benztropine (COGENTIN) 2 MG tablet Take 2 mg by mouth 2 (two) times daily.    . Carbamazepine (EQUETRO) 300 MG CP12 Take 1 capsule by mouth 2 (two) times daily.     . Cholecalciferol (HM VITAMIN D3) 4000 units CAPS Take 4,000 Units by mouth daily.    . clonazePAM (KLONOPIN) 1 MG tablet Take 1 mg by mouth 3 (three) times daily as needed for anxiety.    . cyclobenzaprine (FEXMID) 7.5 MG tablet Take 7.5  mg by mouth 3 (three) times daily as needed for muscle spasms. Reported on 01/30/2016    . HYDROcodone-acetaminophen (NORCO/VICODIN) 5-325 MG tablet Take 1 tablet by mouth every 8 (eight) hours as needed. 15 tablet 0  . levETIRAcetam (KEPPRA) 500 MG tablet Take 1,000 mg by mouth 2 (two) times daily.    Marland Kitchen lovastatin (MEVACOR) 20 MG tablet Take 1 tablet (20 mg total) by mouth daily. 30 tablet 0  . meclizine (ANTIVERT) 12.5 MG tablet Take 1 tablet (12.5 mg total) by mouth 3 (three) times daily as needed for dizziness. 30 tablet 0  . Melatonin 3 MG TABS Take 1 tablet by mouth as needed. Reported on 01/30/2016    . Multiple Vitamin (MULTIVITAMIN) tablet Take 1 tablet by mouth daily.    Marland Kitchen OLANZapine (ZYPREXA) 20 MG tablet Take 20 mg by mouth at bedtime.    . Omeprazole (PRILOSEC PO) Take 20 mg by mouth  daily.     Marland Kitchen ROPINIROLE HCL PO Take 3 mg by mouth at bedtime.     . traZODone (DESYREL) 100 MG tablet 1-2 tablets at bedtime as needed for sleep    . zolpidem (AMBIEN) 10 MG tablet Take 10 mg by mouth at bedtime.     . SUMAtriptan (IMITREX) 100 MG tablet take 1 tablet as needed for headache, may repeat in 2 hours. 10 tablet 2   No current facility-administered medications on file prior to visit.     Review of Systems:  As per HPI- otherwise negative.   Physical Examination: Vitals:   03/15/16 1129  BP: (!) 127/93  Pulse: 93  Temp: 98.5 F (36.9 C)   Vitals:   03/15/16 1129  Weight: 153 lb 6.4 oz (69.6 kg)  Height: 5\' 6"  (1.676 m)   Body mass index is 24.76 kg/m. Ideal Body Weight: Weight in (lb) to have BMI = 25: 154.6  GEN: WDWN, NAD, Non-toxic, A & O x 3, looks her normal self, pleasant and cheerful HEENT: Atraumatic, Normocephalic. Neck supple. No masses, No LAD. Ears and Nose: No external deformity. CV: RRR, No M/G/R. No JVD. No thrill. No extra heart sounds. PULM: CTA B, no wheezes, crackles, rhonchi. No retractions. No resp. distress. No accessory muscle use. ABD: S, NT, ND, +BS. No rebound. No HSM. EXTR: No c/c. Favoring left slightly and wearing a left knee brace NEURO Normal gait.  PSYCH: Normally interactive. Conversant. Not depressed or anxious appearing.  Calm demeanor.   EKG: NSR. No old EKG for comparison but nothing of concern Assessment and Plan: Pre-operative clearance - Plan: EKG 12-Lead, CBC, Comprehensive metabolic panel, Protime-INR  Pre-operative cardiovascular examination  Here today for a pre-operative evaluation.  She has not major risk factors and is able to perform >4METs of physical activity without any symptoms Await labs but did fax in clearance form for her today as she is eager to get her procedure scheduled.  Right now it looks like she will not be able to have it done before september  Signed Lamar Blinks, MD

## 2016-03-17 ENCOUNTER — Encounter: Payer: Self-pay | Admitting: Family Medicine

## 2016-03-17 NOTE — Telephone Encounter (Signed)
Called pt and informed her that we called  [facility]. Waiting on a new pre op clearance form to be sent so we can fax again.

## 2016-03-17 NOTE — Telephone Encounter (Signed)
Pt called in to follow up on form. She says that [place] still hasnt received it. She says that form should be faxed to [facility] - Surgical Clearance Fax: [phone]

## 2016-03-23 NOTE — Telephone Encounter (Signed)
error 

## 2016-03-24 DIAGNOSIS — M23032 Cystic meniscus, other medial meniscus, left knee: Secondary | ICD-10-CM | POA: Diagnosis not present

## 2016-03-24 DIAGNOSIS — M67462 Ganglion, left knee: Secondary | ICD-10-CM | POA: Diagnosis not present

## 2016-03-24 DIAGNOSIS — S83242A Other tear of medial meniscus, current injury, left knee, initial encounter: Secondary | ICD-10-CM | POA: Diagnosis not present

## 2016-03-24 DIAGNOSIS — G8918 Other acute postprocedural pain: Secondary | ICD-10-CM | POA: Diagnosis not present

## 2016-03-24 DIAGNOSIS — M25562 Pain in left knee: Secondary | ICD-10-CM | POA: Diagnosis not present

## 2016-03-24 DIAGNOSIS — S83282A Other tear of lateral meniscus, current injury, left knee, initial encounter: Secondary | ICD-10-CM | POA: Diagnosis not present

## 2016-03-24 DIAGNOSIS — S83262A Peripheral tear of lateral meniscus, current injury, left knee, initial encounter: Secondary | ICD-10-CM | POA: Diagnosis not present

## 2016-03-24 DIAGNOSIS — S83232A Complex tear of medial meniscus, current injury, left knee, initial encounter: Secondary | ICD-10-CM | POA: Diagnosis not present

## 2016-04-01 ENCOUNTER — Encounter: Payer: Self-pay | Admitting: Family Medicine

## 2016-04-22 ENCOUNTER — Ambulatory Visit (INDEPENDENT_AMBULATORY_CARE_PROVIDER_SITE_OTHER): Payer: Medicare Other | Admitting: Family Medicine

## 2016-04-22 ENCOUNTER — Encounter: Payer: Self-pay | Admitting: Family Medicine

## 2016-04-22 VITALS — BP 128/92 | HR 75 | Temp 98.6°F | Ht 66.0 in | Wt 161.0 lb

## 2016-04-22 DIAGNOSIS — K146 Glossodynia: Secondary | ICD-10-CM | POA: Diagnosis not present

## 2016-04-22 MED ORDER — CLOTRIMAZOLE 10 MG MT TROC: 10.0000 mg | Freq: Every day | OROMUCOSAL | 0 refills | Status: DC

## 2016-04-22 NOTE — Progress Notes (Signed)
[place] at [facility] [address], [place], [place] [address] [phone]  Date:  04/22/2016   Name:  Theresa [name]   DOB:  1956-02-06   MRN:  [id number]  PCP:  Lamar Blinks, MD    Chief Complaint: Tongue Pain (c/o tongue pain that gets worse as the day goes by x 1 month. Pt would like to discuss shingles vaccine.)   History of Present Illness:  Theresa [name] is a 60 y.o. very pleasant female patient who presents with the following:  She had her left knee scope and cartilage repair at the beginning of this month. She is doing great in this regard. Her pain is much improved and she is very pleased  Here today with concern of a problem with her tongue. She notes that the back of her tongue hurts to swallow. It gets worse as the day goes on.  She has noted this for about 2 months.  It is not getting worse She has pain with swallowing but nothing is getting stuck Cold- like ice cream- helps her Pt and her husband feel like her voice is a bit deeper than it was in the past.  She did have burning tongue during menopause- this resolved with time She is a never smoker, does not chew tobacco, is a light drinker  Wt Readings from Last 3 Encounters:  04/22/16 161 lb (73 kg)  03/15/16 153 lb 6.4 oz (69.6 kg)  02/02/16 164 lb 12.8 oz (74.8 kg)   She is trying to lose some weight now that her knee is better   Patient Active Problem List   Diagnosis Date Noted  . Special screening for malignant neoplasms, colon 06/16/2012  . Personal history of colonic polyps 06/16/2012  . Bipolar disorder ([place]) 04/02/2012  . Idiopathic generalized epilepsy ([place]) 10/15/2011    Past Medical History:  Diagnosis Date  . Adenomatous colon polyp   . Allergy   . Anxiety   . Bipolar 1 disorder ([place])   . Blood transfusion without reported diagnosis   . Depression   . Elevated LFTs   . Hepatic steatosis 05/07/13  . Hyperlipidemia   . Seizures  ([place])     Past Surgical History:  Procedure Laterality Date  . APPENDECTOMY    . COLONOSCOPY  06/16/2012   Procedure: COLONOSCOPY;  Surgeon: Lafayette Dragon, MD;  Location: [facility];  Service: Endoscopy;  Laterality: N/A;  . DILATION AND CURETTAGE OF UTERUS      Social History  Substance Use Topics  . Smoking status: Never Smoker  . Smokeless tobacco: Never Used  . Alcohol use Yes     Comment: occasionaly 1 a month    Family History  Problem Relation Age of Onset  . Colon polyps Mother   . Cancer Mother   . Heart disease Mother   . Cancer Father   . Colon cancer Neg Hx   . Stomach cancer Neg Hx     Allergies  Allergen Reactions  . Lamictal [Lamotrigine]     Acute renal failure  . Penicillins     Abdominal pain  . Codeine Swelling and Rash    Medication list has been reviewed and updated.  Current Outpatient Prescriptions on File Prior to Visit  Medication Sig Dispense Refill  . benztropine (COGENTIN) 2 MG tablet Take 2 mg by mouth 2 (two) times daily.    . Carbamazepine (EQUETRO) 300 MG CP12 Take 1 capsule by  mouth 2 (two) times daily.     . Cholecalciferol (HM VITAMIN D3) 4000 units CAPS Take 4,000 Units by mouth daily.    . clonazePAM (KLONOPIN) 1 MG tablet Take 1 mg by mouth 3 (three) times daily as needed for anxiety.    . levETIRAcetam (KEPPRA) 500 MG tablet Take 1,000 mg by mouth 2 (two) times daily.    Marland Kitchen lovastatin (MEVACOR) 20 MG tablet Take 1 tablet (20 mg total) by mouth daily. 30 tablet 0  . meclizine (ANTIVERT) 12.5 MG tablet Take 1 tablet (12.5 mg total) by mouth 3 (three) times daily as needed for dizziness. 30 tablet 0  . Melatonin 3 MG TABS Take 1 tablet by mouth as needed. Reported on 01/30/2016    . Multiple Vitamin (MULTIVITAMIN) tablet Take 1 tablet by mouth daily.    Marland Kitchen OLANZapine (ZYPREXA) 20 MG tablet Take 20 mg by mouth at bedtime.    . Omeprazole (PRILOSEC PO) Take 20 mg by mouth daily.     Marland Kitchen ROPINIROLE HCL PO Take 3 mg by mouth at bedtime.      . traZODone (DESYREL) 100 MG tablet 1-2 tablets at bedtime as needed for sleep    . zolpidem (AMBIEN) 10 MG tablet Take 10 mg by mouth at bedtime.     . SUMAtriptan (IMITREX) 100 MG tablet take 1 tablet as needed for headache, may repeat in 2 hours. 10 tablet 2   No current facility-administered medications on file prior to visit.     Review of Systems:  As per HPI- otherwise negative.   Physical Examination: Vitals:   04/22/16 1613  BP: (!) 128/92  Pulse: 75  Temp: 98.6 F (37 C)   Vitals:   04/22/16 1613  Weight: 161 lb (73 kg)  Height: 5\' 6"  (1.676 m)   Body mass index is 25.99 kg/m. Ideal Body Weight: Weight in (lb) to have BMI = 25: 154.6  GEN: WDWN, NAD, Non-toxic, A & O x 3, looks well HEENT: Atraumatic, Normocephalic. Neck supple. No masses, No LAD.  Bilateral TM wnl, oropharynx normal.  PEERL,EOMI.   No visible abnl of the tongue. No masses in the mouth or oropharynx on palpation Ears and Nose: No external deformity. CV: RRR, No M/G/R. No JVD. No thrill. No extra heart sounds. PULM: CTA B, no wheezes, crackles, rhonchi. No retractions. No resp. distress. No accessory muscle use. EXTR: No c/c/e NEURO Normal gait.  PSYCH: Normally interactive. Conversant. Not depressed or anxious appearing.  Calm demeanor.    Assessment and Plan: Tongue pain - Plan: clotrimazole (MYCELEX) 10 MG troche, Ambulatory referral to ENT  Here today with tongue pain- no obvious source on exam. Will try treatment wit mycelex troche but will also start a referral to ENT for a more thorough exam of the base of the tongue. She will update me in one week, Sooner if worse.    Signed Lamar Blinks, MD

## 2016-04-22 NOTE — Progress Notes (Signed)
Pre visit review using our clinic review tool, if applicable. No additional management support is needed unless otherwise documented below in the visit note. 

## 2016-04-22 NOTE — Patient Instructions (Signed)
We will try an antifungal throat drop- use one 5x daily for 7- 10 days.  If not helpful after 7 days you can stop!  I will place an ENT referral for you for about 2 weeks from now.   Please give me an update in a week or so

## 2016-04-24 ENCOUNTER — Other Ambulatory Visit: Payer: Self-pay | Admitting: Urgent Care

## 2016-04-27 ENCOUNTER — Encounter: Payer: Self-pay | Admitting: Family Medicine

## 2016-05-01 ENCOUNTER — Other Ambulatory Visit: Payer: Self-pay | Admitting: Urgent Care

## 2016-05-13 ENCOUNTER — Encounter: Payer: Self-pay | Admitting: Family Medicine

## 2016-05-26 ENCOUNTER — Other Ambulatory Visit: Payer: Self-pay | Admitting: Urgent Care

## 2016-05-28 DIAGNOSIS — G3184 Mild cognitive impairment, so stated: Secondary | ICD-10-CM | POA: Diagnosis not present

## 2016-05-28 DIAGNOSIS — Z79899 Other long term (current) drug therapy: Secondary | ICD-10-CM | POA: Diagnosis not present

## 2016-05-28 DIAGNOSIS — G2581 Restless legs syndrome: Secondary | ICD-10-CM | POA: Diagnosis not present

## 2016-06-04 ENCOUNTER — Other Ambulatory Visit: Payer: Self-pay | Admitting: Family Medicine

## 2016-06-04 DIAGNOSIS — K146 Glossodynia: Secondary | ICD-10-CM

## 2016-06-07 ENCOUNTER — Encounter: Payer: Self-pay | Admitting: Family Medicine

## 2016-06-07 MED ORDER — CLOTRIMAZOLE 10 MG MT TROC: 10.0000 mg | Freq: Every day | OROMUCOSAL | 0 refills | Status: DC

## 2016-06-07 NOTE — Telephone Encounter (Signed)
Pt is requesting refill on Clotrimazole. Please advise.

## 2016-06-09 ENCOUNTER — Other Ambulatory Visit: Payer: Self-pay | Admitting: Family Medicine

## 2016-06-09 DIAGNOSIS — H8110 Benign paroxysmal vertigo, unspecified ear: Secondary | ICD-10-CM

## 2016-06-10 ENCOUNTER — Other Ambulatory Visit: Payer: Self-pay | Admitting: Emergency Medicine

## 2016-06-10 DIAGNOSIS — H8110 Benign paroxysmal vertigo, unspecified ear: Secondary | ICD-10-CM

## 2016-06-10 MED ORDER — MECLIZINE HCL 12.5 MG PO TABS: 12.5000 mg | ORAL_TABLET | Freq: Three times a day (TID) | ORAL | 0 refills | Status: DC | PRN

## 2016-06-10 NOTE — Telephone Encounter (Signed)
Received refill request for MECLIZINE 12.5 MG TABLET. Last office visit 04/22/16 and last refill 12/04/15. Is it ok to refill? Please advise.

## 2016-07-11 ENCOUNTER — Encounter: Payer: Self-pay | Admitting: Family Medicine

## 2016-08-27 ENCOUNTER — Telehealth: Payer: Self-pay

## 2016-08-27 NOTE — Telephone Encounter (Signed)
[facility] 20 mg  [facility] [phone]

## 2016-08-29 NOTE — Telephone Encounter (Signed)
Not our pt. Sent to [place] [facility]

## 2016-08-30 MED ORDER — LOVASTATIN 20 MG PO TABS: 20.0000 mg | ORAL_TABLET | Freq: Every day | ORAL | 3 refills | Status: DC

## 2016-09-13 ENCOUNTER — Encounter: Payer: Self-pay | Admitting: Family Medicine

## 2016-09-13 ENCOUNTER — Ambulatory Visit (INDEPENDENT_AMBULATORY_CARE_PROVIDER_SITE_OTHER): Payer: Medicare Other | Admitting: Family Medicine

## 2016-09-13 VITALS — BP 135/88 | HR 75 | Temp 98.1°F | Ht 66.0 in | Wt 165.0 lb

## 2016-09-13 DIAGNOSIS — Z5181 Encounter for therapeutic drug level monitoring: Secondary | ICD-10-CM

## 2016-09-13 DIAGNOSIS — Z131 Encounter for screening for diabetes mellitus: Secondary | ICD-10-CM

## 2016-09-13 DIAGNOSIS — E782 Mixed hyperlipidemia: Secondary | ICD-10-CM | POA: Diagnosis not present

## 2016-09-13 DIAGNOSIS — Z0000 Encounter for general adult medical examination without abnormal findings: Secondary | ICD-10-CM | POA: Diagnosis not present

## 2016-09-13 DIAGNOSIS — Z130 Encounter for screening for diseases of the blood and blood-forming organs and certain disorders involving the immune mechanism: Secondary | ICD-10-CM | POA: Diagnosis not present

## 2016-09-13 LAB — COMPREHENSIVE METABOLIC PANEL
ALT: 37 U/L — ABNORMAL HIGH (ref 0–35)
AST: 26 U/L (ref 0–37)
Albumin: 4.5 g/dL (ref 3.5–5.2)
Alkaline Phosphatase: 111 U/L (ref 39–117)
BUN: 10 mg/dL (ref 6–23)
CO2: 29 mEq/L (ref 19–32)
Calcium: 9.7 mg/dL (ref 8.4–10.5)
Chloride: 99 mEq/L (ref 96–112)
Creatinine, Ser: 0.82 mg/dL (ref 0.40–1.20)
GFR: 75.47 mL/min (ref >60.00)
Glucose, Bld: 96 mg/dL (ref 70–99)
Potassium: 4.8 mEq/L (ref 3.5–5.1)
Sodium: 134 mEq/L — ABNORMAL LOW (ref 135–145)
Total Bilirubin: 0.4 mg/dL (ref 0.2–1.2)
Total Protein: 7.2 g/dL (ref 6.0–8.3)

## 2016-09-13 LAB — CBC
HCT: 43.3 % (ref 36.0–46.0)
Hemoglobin: 14.6 g/dL (ref 12.0–15.0)
MCHC: 33.7 g/dL (ref 30.0–36.0)
MCV: 87 fl (ref 78.0–100.0)
Platelets: 221 10*3/uL (ref 150.0–400.0)
RBC: 4.97 Mil/uL (ref 3.87–5.11)
RDW: 13.3 % (ref 11.5–15.5)
WBC: 4.9 10*3/uL (ref 4.0–10.5)

## 2016-09-13 LAB — LIPID PANEL
Cholesterol: 205 mg/dL — ABNORMAL HIGH (ref 0–200)
HDL: 65.3 mg/dL (ref >39.00)
LDL Cholesterol: 118 mg/dL — ABNORMAL HIGH (ref 0–99)
NonHDL: 139.76
Total CHOL/HDL Ratio: 3
Triglycerides: 110 mg/dL (ref 0.0–149.0)
VLDL: 22 mg/dL (ref 0.0–40.0)

## 2016-09-13 LAB — HEMOGLOBIN A1C: Hgb A1c MFr Bld: 5.6 % (ref 4.6–6.5)

## 2016-09-13 NOTE — Patient Instructions (Signed)
It was great to see you today-  I will be in touch with your labs asap and will fax them to [facility] The new shingles vaccine- Shingrix- will be available soon and will offer better protection than the zostavax if you want to get this done at a later date  Take care of yourself- stay active and try to eat a healthy diet with plenty of vegetables

## 2016-09-13 NOTE — Progress Notes (Signed)
Pre visit review using our clinic review tool, if applicable. No additional management support is needed unless otherwise documented below in the visit note. 

## 2016-09-13 NOTE — Progress Notes (Addendum)
[place] [facility] [address] [place], [place] [address] [phone]  Date:  09/13/2016   Name:  Theresa [name]   DOB:  [birth year]   MRN:  [id number]  PCP:  Lamar Blinks, MD    Chief Complaint: Annual Exam (Pt here for CPE and labs today. )   History of Present Illness:  Theresa [name] is a 61 y.o. very pleasant female patient who presents with the following:  Here today for a CPE History of Bipolar disorder and epilepsy Last labs in July- last cholesterol in June.    Last pap 18 months ago- normal mammo about one year ago She declines a flu shot today  She had a little OJ this am, otherwise fasting.   No seizures in recent past She noted that her mood is good and stable She does get up too early or just not sleep well at night sometimes, but knows to take action if this behavior persists for more than a day or so She is taking lovastatin 20 daily- no body aches noted   BP Readings from Last 3 Encounters:  09/13/16 135/88  04/22/16 (!) 128/92  03/15/16 122/85   Wt Readings from Last 3 Encounters:  09/13/16 165 lb (74.8 kg)  04/22/16 161 lb (73 kg)  03/15/16 153 lb 6.4 oz (69.6 kg)   She has started a new psychiatric medication which has caused her to gain a little weight.  She also does need some labs per her psychiatric care provider at [facility]-  She needs a carbamazepine level, CBC, LFTs faxed to [phone]  She has an an A1c of 6% in the past but better on more recent recheck  Patient Active Problem List   Diagnosis Date Noted  . Special screening for malignant neoplasms, colon 06/16/2012  . Personal history of colonic polyps 06/16/2012  . Bipolar disorder ([place]) 04/02/2012  . Idiopathic generalized epilepsy ([place]) 10/15/2011    Past Medical History:  Diagnosis Date  . Adenomatous colon polyp   . Allergy   . Anxiety   . Bipolar 1 disorder ([place])   . Blood transfusion without reported  diagnosis   . Depression   . Elevated LFTs   . Hepatic steatosis 05/07/13  . Hyperlipidemia   . Seizures ([place])     Past Surgical History:  Procedure Laterality Date  . APPENDECTOMY    . COLONOSCOPY  06/16/2012   Procedure: COLONOSCOPY;  Surgeon: Lafayette Dragon, MD;  Location: [facility];  Service: Endoscopy;  Laterality: N/A;  . DILATION AND CURETTAGE OF UTERUS      Social History  Substance Use Topics  . Smoking status: Never Smoker  . Smokeless tobacco: Never Used  . Alcohol use Yes     Comment: occasionaly 1 a month    Family History  Problem Relation Age of Onset  . Colon polyps Mother   . Cancer Mother   . Heart disease Mother   . Cancer Father   . Colon cancer Neg Hx   . Stomach cancer Neg Hx     Allergies  Allergen Reactions  . Lamictal [Lamotrigine]     Acute renal failure  . Penicillins     Abdominal pain  . Codeine Swelling and Rash    Medication list has been reviewed and updated.  Current Outpatient Prescriptions on File Prior to Visit  Medication Sig Dispense Refill  . Carbamazepine (EQUETRO) 300 MG CP12 Take 1 capsule  by mouth 2 (two) times daily.     . Cholecalciferol (HM VITAMIN D3) 4000 units CAPS Take 4,000 Units by mouth daily.    . clonazePAM (KLONOPIN) 1 MG tablet Take 1 mg by mouth 3 (three) times daily as needed for anxiety.    . clotrimazole (MYCELEX) 10 MG troche Take 1 tablet (10 mg total) by mouth 5 (five) times daily. Use for 7- 10 days 50 tablet 0  . levETIRAcetam (KEPPRA) 500 MG tablet Take 1,000 mg by mouth 2 (two) times daily.    Marland Kitchen lovastatin (MEVACOR) 20 MG tablet Take 1 tablet (20 mg total) by mouth daily. 90 tablet 3  . meclizine (ANTIVERT) 12.5 MG tablet Take 1 tablet (12.5 mg total) by mouth 3 (three) times daily as needed for dizziness. 30 tablet 0  . Melatonin 3 MG TABS Take 1 tablet by mouth as needed. Reported on 01/30/2016    . Multiple Vitamin (MULTIVITAMIN) tablet Take 1 tablet by mouth daily.    Marland Kitchen OLANZapine (ZYPREXA)  20 MG tablet Take 20 mg by mouth at bedtime.    . Omeprazole (PRILOSEC PO) Take 20 mg by mouth daily.     Marland Kitchen ROPINIROLE HCL PO Take 3 mg by mouth at bedtime.     . traZODone (DESYREL) 100 MG tablet 1-2 tablets at bedtime as needed for sleep    . zolpidem (AMBIEN) 10 MG tablet Take 10 mg by mouth at bedtime.     . SUMAtriptan (IMITREX) 100 MG tablet take 1 tablet as needed for headache, may repeat in 2 hours. 10 tablet 2   No current facility-administered medications on file prior to visit.     Review of Systems:  As per HPI- otherwise negative.  No vaginal bleeding. No CP or SOB with activity   Physical Examination: Vitals:   09/13/16 1342  BP: 135/88  Pulse: 75  Temp: 98.1 F (36.7 C)   Vitals:   09/13/16 1342  Weight: 165 lb (74.8 kg)  Height: 5\' 6"  (1.676 m)   Body mass index is 26.63 kg/m. Ideal Body Weight: Weight in (lb) to have BMI = 25: 154.6  GEN: WDWN, NAD, Non-toxic, A & O x 3, mild overweight HEENT: Atraumatic, Normocephalic. Neck supple. No masses, No LAD. Bilateral TM wnl, oropharynx normal.  PEERL,EOMI.   Ears and Nose: No external deformity. CV: RRR, No M/G/R. No JVD. No thrill. No extra heart sounds. PULM: CTA B, no wheezes, crackles, rhonchi. No retractions. No resp. distress. No accessory muscle use. ABD: S, NT, ND. No rebound. No HSM. EXTR: No c/c/e NEURO Normal gait.  PSYCH: Normally interactive. Conversant. Not depressed or anxious appearing.  Calm demeanor.    Assessment and Plan: Physical exam  Medication monitoring encounter - Plan: Carbamazepine Level (Tegretol), total  Mixed hyperlipidemia - Plan: Lipid panel  Screening for diabetes mellitus - Plan: Comprehensive metabolic panel, Hemoglobin A1c  Screening for deficiency anemia - Plan: CBC  Here today for a CPE She needs some labs done today Declines a flu shot- would like to wait on the new shingles shot as well She needs labs faxed to [facility] Some weight gain recently- she is  trying to work on this but her meds do make it a challenge sometimes.  She needs a carbamazepine level, CBC, LFTs faxed to [phone] Signed Lamar Blinks, MD  Labs to be faxed and released to pt as below-  Results for orders placed or performed in visit on 09/13/16  CBC  Result Value  Ref Range   WBC 4.9 4.0 - 10.5 K/uL   RBC 4.97 3.87 - 5.11 Mil/uL   Platelets 221.0 150.0 - 400.0 K/uL   Hemoglobin 14.6 12.0 - 15.0 g/dL   HCT 43.3 36.0 - 46.0 %   MCV 87.0 78.0 - 100.0 fl   MCHC 33.7 30.0 - 36.0 g/dL   RDW 13.3 11.5 - 15.5 %  Comprehensive metabolic panel  Result Value Ref Range   Sodium 134 (L) 135 - 145 mEq/L   Potassium 4.8 3.5 - 5.1 mEq/L   Chloride 99 96 - 112 mEq/L   CO2 29 19 - 32 mEq/L   Glucose, Bld 96 70 - 99 mg/dL   BUN 10 6 - 23 mg/dL   Creatinine, Ser 0.82 0.40 - 1.20 mg/dL   Total Bilirubin 0.4 0.2 - 1.2 mg/dL   Alkaline Phosphatase 111 39 - 117 U/L   AST 26 0 - 37 U/L   ALT 37 (H) 0 - 35 U/L   Total Protein 7.2 6.0 - 8.3 g/dL   Albumin 4.5 3.5 - 5.2 g/dL   Calcium 9.7 8.4 - 10.5 mg/dL   GFR 75.47 >60.00 mL/min  Lipid panel  Result Value Ref Range   Cholesterol 205 (H) 0 - 200 mg/dL   Triglycerides 110.0 0.0 - 149.0 mg/dL   HDL 65.30 >39.00 mg/dL   VLDL 22.0 0.0 - 40.0 mg/dL   LDL Cholesterol 118 (H) 0 - 99 mg/dL   Total CHOL/HDL Ratio 3    NonHDL 139.76   Hemoglobin A1c  Result Value Ref Range   Hgb A1c MFr Bld 5.6 4.6 - 6.5 %  Carbamazepine Level (Tegretol), total  Result Value Ref Range   Carbamazepine Lvl 15.5 (H) 4.0 - 12.0 mg/L

## 2016-09-14 LAB — CARBAMAZEPINE LEVEL, TOTAL: Carbamazepine Lvl: 15.5 mg/L — ABNORMAL HIGH (ref 4.0–12.0)

## 2016-09-23 ENCOUNTER — Other Ambulatory Visit: Payer: Self-pay | Admitting: Obstetrics and Gynecology

## 2016-09-23 DIAGNOSIS — Z1231 Encounter for screening mammogram for malignant neoplasm of breast: Secondary | ICD-10-CM

## 2016-11-09 ENCOUNTER — Ambulatory Visit
Admission: RE | Admit: 2016-11-09 | Discharge: 2016-11-09 | Disposition: A | Discharge status: Home / Self Care | Payer: Medicare Other | Source: Ambulatory Visit | Attending: Obstetrics and Gynecology | Admitting: Obstetrics and Gynecology

## 2016-11-09 DIAGNOSIS — Z1231 Encounter for screening mammogram for malignant neoplasm of breast: Secondary | ICD-10-CM | POA: Diagnosis not present

## 2016-11-10 ENCOUNTER — Telehealth: Payer: Self-pay | Admitting: *Deleted

## 2016-11-10 NOTE — Telephone Encounter (Signed)
AWV scheduled 02/28/17 immediately following PCP appointment.

## 2016-12-29 DIAGNOSIS — H02834 Dermatochalasis of left upper eyelid: Secondary | ICD-10-CM | POA: Diagnosis not present

## 2016-12-29 DIAGNOSIS — H25813 Combined forms of age-related cataract, bilateral: Secondary | ICD-10-CM | POA: Diagnosis not present

## 2016-12-29 DIAGNOSIS — H02831 Dermatochalasis of right upper eyelid: Secondary | ICD-10-CM | POA: Diagnosis not present

## 2016-12-29 DIAGNOSIS — H31093 Other chorioretinal scars, bilateral: Secondary | ICD-10-CM | POA: Diagnosis not present

## 2017-01-21 DIAGNOSIS — G2401 Drug induced subacute dyskinesia: Secondary | ICD-10-CM | POA: Insufficient documentation

## 2017-02-24 NOTE — Progress Notes (Signed)
Subjective:   Theresa [name] is a 61 y.o. female who presents for an Initial Medicare Annual Wellness Visit.  Accompanied by husband.  Review of Systems    No ROS.  Medicare Wellness Visit. Additional risk factors are reflected in the social history. Cardiac Risk Factors include: advanced age (>71men, >75 women) Sleep patterns:  Naps occasionally. Takes meds to help sleep. Sleep patterns vary. Home Safety/Smoke Alarms: Feels safe in home. Smoke alarms in place.  Living environment; residence and Firearm Safety: Lives with husband. 2 story home. Gun safely stored. Seat Belt Safety/Bike Helmet: Wears seat belt.   Counseling:   Eye Exam- Wearing trifocals. Dr.Groat yearly.  Dental- Dr.Givans every 6 months  Female:   Pap- Last 04/15/15: normal Mammo-  Last 11/09/16:   BI-RADS CATEGORY  1: Negative.  Dexa scan-  ORDERED TODAY.    CCS- Last 06/16/12: Normal    Objective:    Today's Vitals   02/28/17 1250  BP: 123/84  Pulse: 82  Temp: 97.9 F (36.6 C)  TempSrc: Oral  SpO2: 96%  Weight: 170 lb 6.4 oz (77.3 kg)  Height: 5\' 6"  (1.676 m)   Body mass index is 27.5 kg/m.   Current Medications (verified) Outpatient Encounter Prescriptions as of 02/28/2017  Medication Sig  . benztropine (COGENTIN) 0.5 MG tablet Take half tablet (0.25 mg) daily .  . benztropine (COGENTIN) 1 MG tablet Take 1 mg by mouth daily.  . Carbamazepine (EQUETRO) 300 MG CP12 Take 1 capsule by mouth 2 (two) times daily.   . Cholecalciferol (HM VITAMIN D3) 4000 units CAPS Take 4,000 Units by mouth daily.  . clonazePAM (KLONOPIN) 1 MG tablet Take 1 mg by mouth 3 (three) times daily as needed for anxiety.  . INGREZZA 80 MG CAPS Take 80 mg by mouth daily.  Marland Kitchen levETIRAcetam (KEPPRA) 500 MG tablet Take 1,000 mg by mouth 2 (two) times daily.  Marland Kitchen lovastatin (MEVACOR) 20 MG tablet Take 1 tablet (20 mg total) by mouth daily.  . meclizine (ANTIVERT) 12.5 MG tablet Take 1 tablet (12.5 mg total) by mouth 3 (three) times  daily as needed for dizziness.  . Melatonin 3 MG TABS Take 1 tablet by mouth as needed. Reported on 01/30/2016  . Multiple Vitamin (MULTIVITAMIN) tablet Take 1 tablet by mouth daily.  Marland Kitchen OLANZapine (ZYPREXA) 20 MG tablet Take 20 mg by mouth at bedtime.  . Omeprazole (PRILOSEC PO) Take 20 mg by mouth daily.   Marland Kitchen ROPINIROLE HCL PO Take 3 mg by mouth at bedtime.   . traZODone (DESYREL) 100 MG tablet 1-2 tablets at bedtime as needed for sleep  . zolpidem (AMBIEN) 10 MG tablet Take 10 mg by mouth at bedtime.   . SUMAtriptan (IMITREX) 100 MG tablet take 1 tablet as needed for headache, may repeat in 2 hours.  . [DISCONTINUED] clotrimazole (MYCELEX) 10 MG troche Take 1 tablet (10 mg total) by mouth 5 (five) times daily. Use for 7- 10 days (Patient not taking: Reported on 02/28/2017)   No facility-administered encounter medications on file as of 02/28/2017.     Allergies (verified) Lamictal [lamotrigine]; Penicillins; and Codeine   History: Past Medical History:  Diagnosis Date  . Adenomatous colon polyp   . Allergy   . Anxiety   . Bipolar 1 disorder ([place])   . Blood transfusion without reported diagnosis   . Depression   . Elevated LFTs   . Hepatic steatosis 05/07/13  . Hyperlipidemia   . Seizures ([place])   . Tardive dyskinesia  Past Surgical History:  Procedure Laterality Date  . APPENDECTOMY    . COLONOSCOPY  06/16/2012   Procedure: COLONOSCOPY;  Surgeon: Lafayette Dragon, MD;  Location: [facility];  Service: Endoscopy;  Laterality: N/A;  . DILATION AND CURETTAGE OF UTERUS    . KNEE ARTHROSCOPY WITH MENISCAL REPAIR Left    Family History  Problem Relation Age of Onset  . Colon polyps Mother   . Cancer Mother   . Heart disease Mother   . Cancer Father   . Colon cancer Neg Hx   . Stomach cancer Neg Hx   . Breast cancer Neg Hx    Social History   Occupational History  . Not on file.   Social History Main Topics  . Smoking status: Never Smoker  . Smokeless tobacco: Never Used  .  Alcohol use Yes     Comment: occasionaly 1 a month  . Drug use: No  . Sexual activity: Not Currently    Tobacco Counseling Counseling given: Not Answered   Activities of Daily Living In your present state of health, do you have any difficulty performing the following activities: 02/28/2017  Hearing? N  Vision? N  Difficulty concentrating or making decisions? N  Walking or climbing stairs? N  Dressing or bathing? N  Doing errands, shopping? Y  Preparing Food and eating ? Y  Using the Toilet? N  In the past six months, have you accidently leaked urine? N  Do you have problems with loss of bowel control? N  Managing your Medications? N  Managing your Finances? Y  Housekeeping or managing your Housekeeping? N  Some recent data might be hidden    Immunizations and Health Maintenance Immunization History  Administered Date(s) Administered  . Hepatitis B 05/16/2007, 06/16/2007  . Td 08/23/1994, 09/24/1999, 02/28/2017  . Tdap 05/16/2007   Health Maintenance Due  Topic Date Due  . HIV Screening  04/02/1971    Patient Care Team: Copland, Gay Filler, MD as PCP - General (Family Medicine)  Indicate any recent Medical Services you may have received from other than [facility] providers in the past year (date may be approximate).     Assessment:   This is a routine wellness examination for Theresa [name]. No ROS.  Medicare Wellness Visit. Additional risk factors are reflected in the social history.  Hearing/Vision screen Hearing Screening Comments: Able to hear conversational tones w/o difficulty. No issues reported.   Vision Screening Comments: Pt reports yearly eye exam with Dr.Groat   Dietary issues and exercise activities discussed: Current Exercise Habits: Home exercise routine, Type of exercise: yoga, Time (Minutes): 60, Frequency (Times/Week): 1, Weekly Exercise (Minutes/Week): 60 Diet (meal preparation, eat out, water intake, caffeinated beverages, dairy products, fruits and  vegetables): in general, a "healthy" diet  , well balanced      Goals    . Increase physical activity      Depression Screen PHQ 2/9 Scores 02/28/2017 09/19/2015 06/05/2015 03/10/2015  PHQ - 2 Score 0 0 0 0    Fall Risk Fall Risk  02/28/2017 06/05/2015 03/10/2015  Falls in the past year? No Yes No  Number falls in past yr: - 1 -  Injury with Fall? - Yes -    Cognitive Function: MMSE - Mini Mental State Exam 02/28/2017  Orientation to time 5  Orientation to Place 5  Registration 3  Attention/ Calculation 5  Recall 2  Language- name 2 objects 2  Language- repeat 1  Language- follow 3 step command 3  Language-  read & follow direction 1  Write a sentence 1  Copy design 1  Total score 29        Screening Tests Health Maintenance  Topic Date Due  . HIV Screening  04/02/1971  . INFLUENZA VACCINE  03/23/2017  . PAP SMEAR  04/14/2018  . MAMMOGRAM  11/10/2018  . COLONOSCOPY  06/16/2022  . TETANUS/TDAP  03/01/2027  . Hepatitis C Screening  Completed      Plan:   Follow up with PCP as directed.  Continue to eat heart healthy diet (full of fruits, vegetables, whole grains, lean protein, water--limit salt, fat, and sugar intake) and increase physical activity as tolerated.  Continue doing brain stimulating activities (puzzles, reading, adult coloring books, staying active) to keep memory sharp.   Schedule Bone Density Scan. It was ordered today. They will call you to set up appointment.   I have personally reviewed and noted the following in the patient's chart:   . Medical and social history . Use of alcohol, tobacco or illicit drugs  . Current medications and supplements . Functional ability and status . Nutritional status . Physical activity . Advanced directives . List of other physicians . Hospitalizations, surgeries, and ER visits in previous 12 months . Vitals . Screenings to include cognitive, depression, and falls . Referrals and appointments  In addition,  I have reviewed and discussed with patient certain preventive protocols, quality metrics, and best practice recommendations. A written personalized care plan for preventive services as well as general preventive health recommendations were provided to patient.     Shela Nevin, [place]   02/28/2017

## 2017-02-27 NOTE — Progress Notes (Addendum)
Theresa [name] at [facility] [address], [place], [place] [address] [phone]  Date:  02/28/2017   Name:  Theresa [name]   DOB:  1956-08-14   MRN:  [id number]  PCP:  Darreld Mclean, MD    Chief Complaint: Follow-up (Pt here for f/u visit. Will see Theresa [name] for [facility] visit today at 1:30. ) and Medicare Wellness (with RN)   History of Present Illness:  Theresa [name] is a 61 y.o. very pleasant female patient who presents with the following:  Here today for a follow-up OV and MWE Pt well known to me, history of controlled bipolar disorder and epilepsy I last saw her in January as follows:  Here today for a CPE History of Bipolar disorder and epilepsy Last labs in July- last cholesterol in June.    Last pap 18 months ago- normal mammo about one year ago She declines a flu shot today  She had a little OJ this am, otherwise fasting.   No seizures in recent past She noted that her mood is good and stable She does get up too early or just not sleep well at night sometimes, but knows to take action if this behavior persists for more than a day or so She is taking lovastatin 20 daily- no body aches noted      BP Readings from Last 3 Encounters:  09/13/16 135/88  04/22/16 (!) 128/92  03/15/16 122/85      Wt Readings from Last 3 Encounters:  09/13/16 165 lb (74.8 kg)  04/22/16 161 lb (73 kg)  03/15/16 153 lb 6.4 oz (69.6 kg)   She has started a new psychiatric medication which has caused her to gain a little weight.  She also does need some labs per her psychiatric care provider at [name]-  She needs a carbamazepine level, CBC, LFTs faxed to [phone]  She has an an A1c of 6% in the past but better on more recent recheck  Lab Results  Component Value Date   HGBA1C 5.6 09/13/2016   About 3-4 weeks ago she had an episode of some sort- we are not sure if she had a seizure or not.  No LOC.  She just felt like  her muscles were tensing and in spasm. This lasted 3-4 hours and then resolved.  Never had this before or since She sees Theresa [name] with neurology about once a year- she will see him in the fall    She is not driving.    She needs labs for her mental health providerJessica Eulas [name] at [name]- needs a carbamazepaoine level, cmp and CBC  Fax to [phone] In general she is sleeping pretty well Mood has been pretty stable- a little up and down but not bad  She is fasting today for labs   Needs a tetanus today as she is coming due this fall. Her Tdap is done, just needs td today  Her husband and animals are all doing well She continues to struggle with her weight.  She is on zyprexa 20 mg a day   Patient Active Problem List   Diagnosis Date Noted  . Special screening for malignant neoplasms, colon 06/16/2012  . Personal history of colonic polyps 06/16/2012  . Bipolar disorder ([place]) 04/02/2012  . Idiopathic generalized epilepsy ([place]) 10/15/2011    Past Medical History:  Diagnosis Date  . Adenomatous colon polyp   . Allergy   . Anxiety   .  Bipolar 1 disorder ([place])   . Blood transfusion without reported diagnosis   . Depression   . Elevated LFTs   . Hepatic steatosis 05/07/13  . Hyperlipidemia   . Seizures ([place])     Past Surgical History:  Procedure Laterality Date  . APPENDECTOMY    . COLONOSCOPY  06/16/2012   Procedure: COLONOSCOPY;  Surgeon: Lafayette Dragon, MD;  [facility];  Service: Endoscopy;  Laterality: N/A;  . DILATION AND CURETTAGE OF UTERUS      Social History  Substance Use Topics  . Smoking status: Never Smoker  . Smokeless tobacco: Never Used  . Alcohol use Yes     Comment: occasionaly 1 a month    Family History  Problem Relation Age of Onset  . Colon polyps Mother   . Cancer Mother   . Heart disease Mother   . Cancer Father   . Colon cancer Neg Hx   . Stomach cancer Neg Hx   . Breast cancer Neg Hx     Allergies  Allergen Reactions   . Lamictal [Lamotrigine]     Acute renal failure  . Penicillins     Abdominal pain  . Codeine Swelling and Rash    Medication list has been reviewed and updated.  Current Outpatient Prescriptions on File Prior to Visit  Medication Sig Dispense Refill  . benztropine (COGENTIN) 1 MG tablet Take 1 mg by mouth daily.    . Carbamazepine (EQUETRO) 300 MG CP12 Take 1 capsule by mouth 2 (two) times daily.     . Cholecalciferol (HM VITAMIN D3) 4000 units CAPS Take 4,000 Units by mouth daily.    . clonazePAM (KLONOPIN) 1 MG tablet Take 1 mg by mouth 3 (three) times daily as needed for anxiety.    . INGREZZA 80 MG CAPS Take 80 mg by mouth daily.    Marland Kitchen levETIRAcetam (KEPPRA) 500 MG tablet Take 1,000 mg by mouth 2 (two) times daily.    Marland Kitchen lovastatin (MEVACOR) 20 MG tablet Take 1 tablet (20 mg total) by mouth daily. 90 tablet 3  . meclizine (ANTIVERT) 12.5 MG tablet Take 1 tablet (12.5 mg total) by mouth 3 (three) times daily as needed for dizziness. 30 tablet 0  . Melatonin 3 MG TABS Take 1 tablet by mouth as needed. Reported on 01/30/2016    . Multiple Vitamin (MULTIVITAMIN) tablet Take 1 tablet by mouth daily.    Marland Kitchen OLANZapine (ZYPREXA) 20 MG tablet Take 20 mg by mouth at bedtime.    . Omeprazole (PRILOSEC PO) Take 20 mg by mouth daily.     Marland Kitchen ROPINIROLE HCL PO Take 3 mg by mouth at bedtime.     . traZODone (DESYREL) 100 MG tablet 1-2 tablets at bedtime as needed for sleep    . zolpidem (AMBIEN) 10 MG tablet Take 10 mg by mouth at bedtime.     . SUMAtriptan (IMITREX) 100 MG tablet take 1 tablet as needed for headache, may repeat in 2 hours. 10 tablet 2   No current facility-administered medications on file prior to visit.     Review of Systems:  As per HPI- otherwise negative.   Physical Examination: Vitals:   02/28/17 1250  BP: 123/84  Pulse: 82  Temp: 97.9 F (36.6 C)   Vitals:   02/28/17 1250  Weight: 170 lb 6.4 oz (77.3 kg)  Height: 5\' 6"  (1.676 m)   Body mass index is 27.5  kg/m. Ideal Body Weight: Weight in (lb) to have BMI =  25: 154.6  GEN: WDWN, NAD, Non-toxic, A & O x 3., mild overweight, otherwise looks well HEENT: Atraumatic, Normocephalic. Neck supple. No masses, No LAD. Ears and Nose: No external deformity. CV: RRR, No M/G/R. No JVD. No thrill. No extra heart sounds. PULM: CTA B, no wheezes, crackles, rhonchi. No retractions. No resp. distress. No accessory muscle use. ABD: S, NT, ND, +BS. No rebound. No HSM. EXTR: No c/c/e NEURO Normal gait.  PSYCH: Normally interactive. Conversant. Not depressed or anxious appearing.  Calm demeanor.    Assessment and Plan: Medication monitoring encounter - Plan: CBC, Comprehensive metabolic panel, Carbamazepine Level (Tegretol), total  Seizure disorder (HCC) - Plan: Carbamazepine Level (Tegretol), total  Mood disorder in conditions classified elsewhere - Plan: Carbamazepine Level (Tegretol), total  Immunization due - Plan: Td : Tetanus/diphtheria >7yo Preservative  free  Pre-diabetes - Plan: Hemoglobin A1c  Postmenopausal - Plan: DG Bone Density  Encounter for Medicare annual wellness exam  Labs pending as above Referral for bone density Updated td today Will fax labs to her psychiatrist when they come in She will report if she has any other unusual episodes and will look for any lab abnl today Encouraged her weight loss efforts but did reinforce that her medications can also cause weight gain- she should not be too hard on herself   Signed Lamar Blinks, MD  I have reviewed MWE by Ms. Vevelyn Royals and agree with her documentation   Received labs on 7/10  Results for orders placed or performed in visit on 02/28/17  CBC  Result Value Ref Range   WBC 5.1 4.0 - 10.5 K/uL   RBC 5.21 (H) 3.87 - 5.11 Mil/uL   Platelets 205.0 150.0 - 400.0 K/uL   Hemoglobin 15.2 (H) 12.0 - 15.0 g/dL   HCT 45.5 36.0 - 46.0 %   MCV 87.3 78.0 - 100.0 fl   MCHC 33.5 30.0 - 36.0 g/dL   RDW 13.7 11.5 - 15.5 %   Comprehensive metabolic panel  Result Value Ref Range   Sodium 135 135 - 145 mEq/L   Potassium 4.1 3.5 - 5.1 mEq/L   Chloride 98 96 - 112 mEq/L   CO2 29 19 - 32 mEq/L   Glucose, Bld 100 (H) 70 - 99 mg/dL   BUN 14 6 - 23 mg/dL   Creatinine, Ser 0.84 0.40 - 1.20 mg/dL   Total Bilirubin 0.4 0.2 - 1.2 mg/dL   Alkaline Phosphatase 123 (H) 39 - 117 U/L   AST 20 0 - 37 U/L   ALT 33 0 - 35 U/L   Total Protein 7.3 6.0 - 8.3 g/dL   Albumin 4.5 3.5 - 5.2 g/dL   Calcium 9.7 8.4 - 10.5 mg/dL   GFR 73.28 >60.00 mL/min  Carbamazepine Level (Tegretol), total  Result Value Ref Range   Carbamazepine Lvl 13.2 (H) 4.0 - 12.0 mg/L  Hemoglobin A1c  Result Value Ref Range   Hgb A1c MFr Bld 5.7 4.6 - 6.5 %   Message to pt, and will fax her labs as requested

## 2017-02-28 ENCOUNTER — Ambulatory Visit (INDEPENDENT_AMBULATORY_CARE_PROVIDER_SITE_OTHER): Payer: Medicare Other | Admitting: Family Medicine

## 2017-02-28 ENCOUNTER — Encounter: Payer: Self-pay | Admitting: Family Medicine

## 2017-02-28 VITALS — BP 123/84 | HR 82 | Temp 97.9°F | Ht 66.0 in | Wt 170.4 lb

## 2017-02-28 DIAGNOSIS — Z780 Asymptomatic menopausal state: Secondary | ICD-10-CM | POA: Diagnosis not present

## 2017-02-28 DIAGNOSIS — Z5181 Encounter for therapeutic drug level monitoring: Secondary | ICD-10-CM

## 2017-02-28 DIAGNOSIS — F0630 Mood disorder due to known physiological condition, unspecified: Secondary | ICD-10-CM | POA: Diagnosis not present

## 2017-02-28 DIAGNOSIS — G40909 Epilepsy, unspecified, not intractable, without status epilepticus: Secondary | ICD-10-CM | POA: Diagnosis not present

## 2017-02-28 DIAGNOSIS — Z0000 Encounter for general adult medical examination without abnormal findings: Secondary | ICD-10-CM | POA: Diagnosis not present

## 2017-02-28 DIAGNOSIS — R7303 Prediabetes: Secondary | ICD-10-CM

## 2017-02-28 DIAGNOSIS — Z23 Encounter for immunization: Secondary | ICD-10-CM | POA: Diagnosis not present

## 2017-02-28 LAB — COMPREHENSIVE METABOLIC PANEL
ALT: 33 U/L (ref 0–35)
AST: 20 U/L (ref 0–37)
Albumin: 4.5 g/dL (ref 3.5–5.2)
Alkaline Phosphatase: 123 U/L — ABNORMAL HIGH (ref 39–117)
BUN: 14 mg/dL (ref 6–23)
CO2: 29 mEq/L (ref 19–32)
Calcium: 9.7 mg/dL (ref 8.4–10.5)
Chloride: 98 mEq/L (ref 96–112)
Creatinine, Ser: 0.84 mg/dL (ref 0.40–1.20)
GFR: 73.28 mL/min (ref >60.00)
Glucose, Bld: 100 mg/dL — ABNORMAL HIGH (ref 70–99)
Potassium: 4.1 mEq/L (ref 3.5–5.1)
Sodium: 135 mEq/L (ref 135–145)
Total Bilirubin: 0.4 mg/dL (ref 0.2–1.2)
Total Protein: 7.3 g/dL (ref 6.0–8.3)

## 2017-02-28 LAB — CBC
HCT: 45.5 % (ref 36.0–46.0)
Hemoglobin: 15.2 g/dL — ABNORMAL HIGH (ref 12.0–15.0)
MCHC: 33.5 g/dL (ref 30.0–36.0)
MCV: 87.3 fl (ref 78.0–100.0)
Platelets: 205 10*3/uL (ref 150.0–400.0)
RBC: 5.21 Mil/uL — ABNORMAL HIGH (ref 3.87–5.11)
RDW: 13.7 % (ref 11.5–15.5)
WBC: 5.1 10*3/uL (ref 4.0–10.5)

## 2017-02-28 LAB — HEMOGLOBIN A1C: Hgb A1c MFr Bld: 5.7 % (ref 4.6–6.5)

## 2017-02-28 NOTE — Patient Instructions (Addendum)
It was great to see you today!  Take care and I will be in touch with your labs asap Best of luck with your weight loss efforts    Theresa [name] , Thank you for taking time to come for your Medicare Wellness Visit. I appreciate your ongoing commitment to your health goals. Please review the following plan we discussed and let me know if I can assist you in the future.   These are the goals we discussed: Goals    None      This is a list of the screening recommended for you and due dates:  Health Maintenance  Topic Date Due  . HIV Screening  04/02/1971  . Flu Shot  03/23/2017  . Pap Smear  04/14/2018  . Mammogram  11/10/2018  . Colon Cancer Screening  06/16/2022  . Tetanus Vaccine  03/01/2027  .  Hepatitis C: One time screening is recommended by Center for Disease Control  (CDC) for  adults born from [birth year] through 1965.   Completed   Continue to eat heart healthy diet (full of fruits, vegetables, whole grains, lean protein, water--limit salt, fat, and sugar intake) and increase physical activity as tolerated.  Continue doing brain stimulating activities (puzzles, reading, adult coloring books, staying active) to keep memory sharp.   Schedule Bone Density Scan. It was ordered today. They will call you to set up appointment.   Health Maintenance for Postmenopausal Women Menopause is a normal process in which your reproductive ability comes to an end. This process happens gradually over a span of months to years, usually between the ages of 53 and 45. Menopause is complete when you have missed 12 consecutive menstrual periods. It is important to talk with your health care provider about some of the most common conditions that affect postmenopausal women, such as heart disease, cancer, and bone loss (osteoporosis). Adopting a healthy lifestyle and getting preventive care can help to promote your health and wellness. Those actions can also lower your chances of developing some of these common  conditions. What should I know about menopause? During menopause, you may experience a number of symptoms, such as:  Moderate-to-severe hot flashes.  Night sweats.  Decrease in sex drive.  Mood swings.  Headaches.  Tiredness.  Irritability.  Memory problems.  Insomnia.  Choosing to treat or not to treat menopausal changes is an individual decision that you make with your health care provider. What should I know about hormone replacement therapy and supplements? Hormone therapy products are effective for treating symptoms that are associated with menopause, such as hot flashes and night sweats. Hormone replacement carries certain risks, especially as you become older. If you are thinking about using estrogen or estrogen with progestin treatments, discuss the benefits and risks with your health care provider. What should I know about heart disease and stroke? Heart disease, heart attack, and stroke become more likely as you age. This may be due, in part, to the hormonal changes that your body experiences during menopause. These can affect how your body processes dietary fats, triglycerides, and cholesterol. Heart attack and stroke are both medical emergencies. There are many things that you can do to help prevent heart disease and stroke:  Have your blood pressure checked at least every 1-2 years. High blood pressure causes heart disease and increases the risk of stroke.  If you are 56-40 years old, ask your health care provider if you should take aspirin to prevent a heart attack or a stroke.  Do  not use any tobacco products, including cigarettes, chewing tobacco, or electronic cigarettes. If you need help quitting, ask your health care provider.  It is important to eat a healthy diet and maintain a healthy weight. ? Be sure to include plenty of vegetables, fruits, low-fat dairy products, and lean protein. ? Avoid eating foods that are high in solid fats, added sugars, or salt  (sodium).  Get regular exercise. This is one of the most important things that you can do for your health. ? Try to exercise for at least 150 minutes each week. The type of exercise that you do should increase your heart rate and make you sweat. This is known as moderate-intensity exercise. ? Try to do strengthening exercises at least twice each week. Do these in addition to the moderate-intensity exercise.  Know your numbers.Ask your health care provider to check your cholesterol and your blood glucose. Continue to have your blood tested as directed by your health care provider.  What should I know about cancer screening? There are several types of cancer. Take the following steps to reduce your risk and to catch any cancer development as early as possible. Breast Cancer  Practice breast self-awareness. ? This means understanding how your breasts normally appear and feel. ? It also means doing regular breast self-exams. Let your health care provider know about any changes, no matter how small.  If you are 60 or older, have a clinician do a breast exam (clinical breast exam or CBE) every year. Depending on your age, family history, and medical history, it may be recommended that you also have a yearly breast X-ray (mammogram).  If you have a family history of breast cancer, talk with your health care provider about genetic screening.  If you are at high risk for breast cancer, talk with your health care provider about having an MRI and a mammogram every year.  Breast cancer (BRCA) gene test is recommended for women who have family members with BRCA-related cancers. Results of the assessment will determine the need for genetic counseling and BRCA1 and for BRCA2 testing. BRCA-related cancers include these types: ? Breast. This occurs in males or females. ? Ovarian. ? Tubal. This may also be called fallopian tube cancer. ? Cancer of the abdominal or pelvic lining (peritoneal  cancer). ? Prostate. ? Pancreatic.  Cervical, Uterine, and Ovarian Cancer Your health care provider may recommend that you be screened regularly for cancer of the pelvic organs. These include your ovaries, uterus, and vagina. This screening involves a pelvic exam, which includes checking for microscopic changes to the surface of your cervix (Pap test).  For women ages 21-65, health care providers may recommend a pelvic exam and a Pap test every three years. For women ages 35-65, they may recommend the Pap test and pelvic exam, combined with testing for human papilloma virus (HPV), every five years. Some types of HPV increase your risk of cervical cancer. Testing for HPV may also be done on women of any age who have unclear Pap test results.  Other health care providers may not recommend any screening for nonpregnant women who are considered low risk for pelvic cancer and have no symptoms. Ask your health care provider if a screening pelvic exam is right for you.  If you have had past treatment for cervical cancer or a condition that could lead to cancer, you need Pap tests and screening for cancer for at least 20 years after your treatment. If Pap tests have been discontinued for  you, your risk factors (such as having a new sexual partner) need to be reassessed to determine if you should start having screenings again. Some women have medical problems that increase the chance of getting cervical cancer. In these cases, your health care provider may recommend that you have screening and Pap tests more often.  If you have a family history of uterine cancer or ovarian cancer, talk with your health care provider about genetic screening.  If you have vaginal bleeding after reaching menopause, tell your health care provider.  There are currently no reliable tests available to screen for ovarian cancer.  Lung Cancer Lung cancer screening is recommended for adults 19-39 years old who are at high risk for  lung cancer because of a history of smoking. A yearly low-dose CT scan of the lungs is recommended if you:  Currently smoke.  Have a history of at least 30 pack-years of smoking and you currently smoke or have quit within the past 15 years. A pack-year is smoking an average of one pack of cigarettes per day for one year.  Yearly screening should:  Continue until it has been 15 years since you quit.  Stop if you develop a health problem that would prevent you from having lung cancer treatment.  Colorectal Cancer  This type of cancer can be detected and can often be prevented.  Routine colorectal cancer screening usually begins at age 41 and continues through age 17.  If you have risk factors for colon cancer, your health care provider may recommend that you be screened at an earlier age.  If you have a family history of colorectal cancer, talk with your health care provider about genetic screening.  Your health care provider may also recommend using home test kits to check for hidden blood in your stool.  A small camera at the end of a tube can be used to examine your colon directly (sigmoidoscopy or colonoscopy). This is done to check for the earliest forms of colorectal cancer.  Direct examination of the colon should be repeated every 5-10 years until age 5. However, if early forms of precancerous polyps or small growths are found or if you have a family history or genetic risk for colorectal cancer, you may need to be screened more often.  Skin Cancer  Check your skin from head to toe regularly.  Monitor any moles. Be sure to tell your health care provider: ? About any new moles or changes in moles, especially if there is a change in a mole's shape or color. ? If you have a mole that is larger than the size of a pencil eraser.  If any of your family members has a history of skin cancer, especially at a young age, talk with your health care provider about genetic  screening.  Always use sunscreen. Apply sunscreen liberally and repeatedly throughout the day.  Whenever you are outside, protect yourself by wearing long sleeves, pants, a wide-brimmed hat, and sunglasses.  What should I know about osteoporosis? Osteoporosis is a condition in which bone destruction happens more quickly than new bone creation. After menopause, you may be at an increased risk for osteoporosis. To help prevent osteoporosis or the bone fractures that can happen because of osteoporosis, the following is recommended:  If you are 74-40 years old, get at least 1,000 mg of calcium and at least 600 mg of vitamin D per day.  If you are older than age 81 but younger than age 1, get at  least 1,200 mg of calcium and at least 600 mg of vitamin D per day.  If you are older than age 22, get at least 1,200 mg of calcium and at least 800 mg of vitamin D per day.  Smoking and excessive alcohol intake increase the risk of osteoporosis. Eat foods that are rich in calcium and vitamin D, and do weight-bearing exercises several times each week as directed by your health care provider. What should I know about how menopause affects my mental health? Depression may occur at any age, but it is more common as you become older. Common symptoms of depression include:  Low or sad mood.  Changes in sleep patterns.  Changes in appetite or eating patterns.  Feeling an overall lack of motivation or enjoyment of activities that you previously enjoyed.  Frequent crying spells.  Talk with your health care provider if you think that you are experiencing depression. What should I know about immunizations? It is important that you get and maintain your immunizations. These include:  Tetanus, diphtheria, and pertussis (Tdap) booster vaccine.  Influenza every year before the flu season begins.  Pneumonia vaccine.  Shingles vaccine.  Your health care provider may also recommend other  immunizations. This information is not intended to replace advice given to you by your health care provider. Make sure you discuss any questions you have with your health care provider. Document Released: 10/01/2005 Document Revised: 02/27/2016 Document Reviewed: 05/13/2015 Elsevier Interactive Patient Education  2018 [employer].

## 2017-03-01 ENCOUNTER — Encounter: Payer: Self-pay | Admitting: Family Medicine

## 2017-03-01 LAB — CARBAMAZEPINE LEVEL, TOTAL: Carbamazepine Lvl: 13.2 mg/L — ABNORMAL HIGH (ref 4.0–12.0)

## 2017-03-29 ENCOUNTER — Other Ambulatory Visit: Payer: Self-pay | Admitting: Family Medicine

## 2017-04-19 ENCOUNTER — Other Ambulatory Visit: Payer: Self-pay | Admitting: Obstetrics and Gynecology

## 2017-04-19 ENCOUNTER — Other Ambulatory Visit (HOSPITAL_COMMUNITY)
Admission: RE | Admit: 2017-04-19 | Discharge: 2017-04-19 | Disposition: A | Discharge status: Home / Self Care | Payer: Medicare Other | Source: Ambulatory Visit | Attending: Obstetrics and Gynecology | Admitting: Obstetrics and Gynecology

## 2017-04-19 DIAGNOSIS — N63 Unspecified lump in unspecified breast: Secondary | ICD-10-CM

## 2017-04-19 DIAGNOSIS — Z124 Encounter for screening for malignant neoplasm of cervix: Secondary | ICD-10-CM | POA: Insufficient documentation

## 2017-04-19 DIAGNOSIS — Z9189 Other specified personal risk factors, not elsewhere classified: Secondary | ICD-10-CM | POA: Diagnosis not present

## 2017-04-19 DIAGNOSIS — R87810 Cervical high risk human papillomavirus (HPV) DNA test positive: Secondary | ICD-10-CM | POA: Diagnosis present

## 2017-04-19 DIAGNOSIS — N6489 Other specified disorders of breast: Secondary | ICD-10-CM | POA: Diagnosis not present

## 2017-04-20 LAB — CYTOLOGY - PAP
Diagnosis: NEGATIVE
HPV 16/18/45 genotyping: NEGATIVE
HPV: DETECTED — AB

## 2017-04-22 ENCOUNTER — Ambulatory Visit
Admission: RE | Admit: 2017-04-22 | Discharge: 2017-04-22 | Disposition: A | Discharge status: Home / Self Care | Payer: Medicare Other | Source: Ambulatory Visit | Attending: Obstetrics and Gynecology | Admitting: Obstetrics and Gynecology

## 2017-04-22 DIAGNOSIS — N63 Unspecified lump in unspecified breast: Secondary | ICD-10-CM

## 2017-04-22 DIAGNOSIS — R928 Other abnormal and inconclusive findings on diagnostic imaging of breast: Secondary | ICD-10-CM | POA: Diagnosis not present

## 2017-06-02 ENCOUNTER — Telehealth: Payer: Self-pay | Admitting: Family Medicine

## 2017-06-02 ENCOUNTER — Ambulatory Visit: Payer: Self-pay | Admitting: Family Medicine

## 2017-06-02 MED ORDER — DOXYCYCLINE HYCLATE 100 MG PO CAPS: 100.0000 mg | ORAL_CAPSULE | Freq: Two times a day (BID) | ORAL | 0 refills | Status: DC
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
## 2017-06-06 ENCOUNTER — Encounter: Payer: Self-pay | Admitting: Family Medicine
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
## 2017-06-13 ENCOUNTER — Ambulatory Visit: Payer: Self-pay | Admitting: Family Medicine

## 2017-06-13 ENCOUNTER — Ambulatory Visit (INDEPENDENT_AMBULATORY_CARE_PROVIDER_SITE_OTHER): Payer: Medicare Other | Admitting: Family Medicine

## 2017-06-13 ENCOUNTER — Ambulatory Visit (HOSPITAL_BASED_OUTPATIENT_CLINIC_OR_DEPARTMENT_OTHER)
Admission: RE | Admit: 2017-06-13 | Discharge: 2017-06-13 | Disposition: A | Discharge status: Home / Self Care | Payer: Medicare Other | Source: Ambulatory Visit | Attending: Family Medicine | Admitting: Family Medicine

## 2017-06-13 VITALS — BP 134/91 | HR 83 | Temp 98.3°F | Ht 66.0 in | Wt 165.8 lb

## 2017-06-13 DIAGNOSIS — Z5181 Encounter for therapeutic drug level monitoring: Secondary | ICD-10-CM

## 2017-06-13 DIAGNOSIS — R05 Cough: Secondary | ICD-10-CM

## 2017-06-13 DIAGNOSIS — R053 Chronic cough: Secondary | ICD-10-CM

## 2017-06-13 DIAGNOSIS — R0602 Shortness of breath: Secondary | ICD-10-CM | POA: Diagnosis not present

## 2017-06-13 MED ORDER — BENZONATATE 100 MG PO CAPS: 100.0000 mg | ORAL_CAPSULE | Freq: Three times a day (TID) | ORAL | 0 refills | Status: DC | PRN

## 2017-06-13 NOTE — Patient Instructions (Signed)
We will have you try tessalon perles for cough- use as needed You can also use some of the flovent that you have at home- 2 puffs twice a day Please let me know how you are feeling at the end of this week I will be in touch with your labs asap Let me know if you are getting worse!

## 2017-06-14 ENCOUNTER — Encounter: Payer: Self-pay | Admitting: Family Medicine

## 2017-06-14 LAB — COMPREHENSIVE METABOLIC PANEL
ALT: 26 U/L (ref 0–35)
AST: 22 U/L (ref 0–37)
Albumin: 4.3 g/dL (ref 3.5–5.2)
Alkaline Phosphatase: 116 U/L (ref 39–117)
BUN: 12 mg/dL (ref 6–23)
CO2: 29 mEq/L (ref 19–32)
Calcium: 9.2 mg/dL (ref 8.4–10.5)
Chloride: 101 mEq/L (ref 96–112)
Creatinine, Ser: 0.78 mg/dL (ref 0.40–1.20)
GFR: 79.75 mL/min (ref >60.00)
Glucose, Bld: 92 mg/dL (ref 70–99)
Potassium: 3.9 mEq/L (ref 3.5–5.1)
Sodium: 139 mEq/L (ref 135–145)
Total Bilirubin: 0.4 mg/dL (ref 0.2–1.2)
Total Protein: 6.9 g/dL (ref 6.0–8.3)

## 2017-06-14 LAB — CBC
HCT: 45 % (ref 36.0–46.0)
Hemoglobin: 14.7 g/dL (ref 12.0–15.0)
MCHC: 32.8 g/dL (ref 30.0–36.0)
MCV: 90.2 fl (ref 78.0–100.0)
Platelets: 230 10*3/uL (ref 150.0–400.0)
RBC: 4.99 Mil/uL (ref 3.87–5.11)
RDW: 13.9 % (ref 11.5–15.5)
WBC: 6.4 10*3/uL (ref 4.0–10.5)

## 2017-06-14 LAB — LIPID PANEL
Cholesterol: 192 mg/dL (ref 0–200)
HDL: 61.6 mg/dL (ref >39.00)
LDL Cholesterol: 99 mg/dL (ref 0–99)
NonHDL: 130.38
Total CHOL/HDL Ratio: 3
Triglycerides: 158 mg/dL — ABNORMAL HIGH (ref 0.0–149.0)
VLDL: 31.6 mg/dL (ref 0.0–40.0)

## 2017-06-15 ENCOUNTER — Ambulatory Visit (INDEPENDENT_AMBULATORY_CARE_PROVIDER_SITE_OTHER): Payer: Medicare Other | Admitting: Neurology

## 2017-06-15 ENCOUNTER — Encounter: Payer: Self-pay | Admitting: Neurology

## 2017-06-15 VITALS — BP 112/84 | HR 100 | Ht 66.5 in | Wt 166.0 lb

## 2017-06-15 DIAGNOSIS — G40309 Generalized idiopathic epilepsy and epileptic syndromes, not intractable, without status epilepticus: Secondary | ICD-10-CM | POA: Diagnosis not present
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
## 2017-06-16 ENCOUNTER — Encounter: Payer: Self-pay | Admitting: Family Medicine

## 2017-06-18 ENCOUNTER — Telehealth: Payer: Self-pay | Admitting: Family Medicine

## 2017-06-20 ENCOUNTER — Encounter: Payer: Self-pay | Admitting: Family Medicine

## 2017-06-20 ENCOUNTER — Other Ambulatory Visit: Payer: Self-pay | Admitting: Family Medicine

## 2017-06-20 MED ORDER — DOXYCYCLINE HYCLATE 100 MG PO CAPS: 100.0000 mg | ORAL_CAPSULE | Freq: Two times a day (BID) | ORAL | 0 refills | Status: DC

## 2017-06-20 NOTE — Telephone Encounter (Signed)
Patient still experiencing symptoms of last cold. Still has existing cough, please advise if more medication is needed or OV.

## 2017-06-20 NOTE — Telephone Encounter (Signed)
Pt is requesting refill on doxycycline- stating cough is actually worse, not better.

## 2017-06-24 DIAGNOSIS — G40309 Generalized idiopathic epilepsy and epileptic syndromes, not intractable, without status epilepticus: Secondary | ICD-10-CM | POA: Insufficient documentation

## 2017-06-30 ENCOUNTER — Ambulatory Visit: Payer: Self-pay | Admitting: Family Medicine

## 2017-07-28 ENCOUNTER — Encounter: Payer: Self-pay | Admitting: Family Medicine

## 2017-08-11 ENCOUNTER — Other Ambulatory Visit: Payer: Self-pay | Admitting: Family Medicine

## 2017-10-05 ENCOUNTER — Other Ambulatory Visit: Payer: Self-pay | Admitting: Obstetrics and Gynecology

## 2017-10-05 DIAGNOSIS — Z1231 Encounter for screening mammogram for malignant neoplasm of breast: Secondary | ICD-10-CM

## 2017-11-08 ENCOUNTER — Telehealth: Payer: Self-pay

## 2017-11-08 NOTE — Telephone Encounter (Signed)
Received after hours message that pt would  Like to reschedule her appointment.  Called pt, LMOM asking for return call to reschedule.

## 2017-11-10 ENCOUNTER — Ambulatory Visit
Admission: RE | Admit: 2017-11-10 | Discharge: 2017-11-10 | Disposition: A | Discharge status: Home / Self Care | Payer: Medicare Other | Source: Ambulatory Visit | Attending: Obstetrics and Gynecology | Admitting: Obstetrics and Gynecology

## 2017-11-10 DIAGNOSIS — Z1231 Encounter for screening mammogram for malignant neoplasm of breast: Secondary | ICD-10-CM

## 2017-12-19 ENCOUNTER — Ambulatory Visit: Payer: Medicare Other | Admitting: Neurology

## 2018-03-01 ENCOUNTER — Ambulatory Visit: Payer: Medicare Other | Admitting: *Deleted

## 2018-03-01 NOTE — Progress Notes (Deleted)
Subjective:   Theresa [name] is a 62 y.o. female who presents for Medicare Annual (Subsequent) preventive examination.  Review of Systems: No ROS.  Medicare Wellness Visit. Additional risk factors are reflected in the social history.   Sleep patterns: Home Safety/Smoke Alarms: Feels safe in home. Smoke alarms in place.  Living environment; residence and Firearm Safety:   Female:   Pap-utd       Mammo- utd      Dexa scan- active order   CCS-due 05/2022    Objective:     Vitals: There were no vitals taken for this visit.  There is no height or weight on file to calculate BMI.  Advanced Directives 02/28/2017 06/16/2012  Does Patient Have a Medical Advance Directive? Yes Patient has advance directive, copy not in chart  Type of Advance Directive [place];Living will Living will  Copy of [place] in Chart? No - copy requested Copy requested from other (Comment)  Pre-existing out of facility DNR order (yellow form or pink MOST form) - No    Tobacco Social History   Tobacco Use  Smoking Status Never Smoker  Smokeless Tobacco Never Used     Counseling given: Not Answered   Clinical Intake:                       Past Medical History:  Diagnosis Date  . Adenomatous colon polyp   . Allergy   . Anxiety   . Bipolar 1 disorder ([place])   . Blood transfusion without reported diagnosis   . Depression   . Elevated LFTs   . Hepatic steatosis 05/07/13  . Hyperlipidemia   . Seizures ([place])   . Tardive dyskinesia    Past Surgical History:  Procedure Laterality Date  . APPENDECTOMY    . COLONOSCOPY  06/16/2012   Procedure: COLONOSCOPY;  Surgeon: Lafayette Dragon, MD;  Location: [facility];  Service: Endoscopy;  Laterality: N/A;  . DILATION AND CURETTAGE OF UTERUS    . KNEE ARTHROSCOPY WITH MENISCAL REPAIR Left    Family History  Problem Relation Age of Onset  . Colon polyps Mother   . Cancer Mother   . Heart disease Mother    . Cancer Father   . Colon cancer Neg Hx   . Stomach cancer Neg Hx   . Breast cancer Neg Hx    Social History   Socioeconomic History  . Marital status: Married    Spouse name: Not on file  . Number of children: Not on file  . Years of education: Not on file  . Highest education level: Not on file  Occupational History  . Not on file  Social Needs  . Financial resource strain: Not on file  . Food insecurity:    Worry: Not on file    Inability: Not on file  . Transportation needs:    Medical: Not on file    Non-medical: Not on file  Tobacco Use  . Smoking status: Never Smoker  . Smokeless tobacco: Never Used  Substance and Sexual Activity  . Alcohol use: Yes    Comment: occasionaly 1 a month  . Drug use: No  . Sexual activity: Not Currently  Lifestyle  . Physical activity:    Days per week: Not on file    Minutes per session: Not on file  . Stress: Not on file  Relationships  . Social connections:    Talks on phone: Not on  file    Gets together: Not on file    Attends religious service: Not on file    Active member of club or organization: Not on file    Attends meetings of clubs or organizations: Not on file    Relationship status: Not on file  Other Topics Concern  . 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 benzonatate (TESSALON) 100 MG capsule Take 1 capsule (100 mg total) by mouth 3 (three) times daily as needed for cough.  . benztropine (COGENTIN) 0.5 MG tablet Take half tablet (0.25 mg) daily .  . benztropine (COGENTIN) 1 MG tablet Take 1 mg by mouth daily.  . Carbamazepine (EQUETRO) 300 MG CP12 Take 1 capsule by mouth 2 (two) times daily.   . Cholecalciferol (HM VITAMIN D3) 4000 units CAPS Take 4,000 Units by mouth daily.  . clonazePAM (KLONOPIN) 1 MG tablet  Take 1 mg by mouth 3 (three) times daily as needed for anxiety.  Marland Kitchen doxycycline (VIBRAMYCIN) 100 MG capsule Take 1 capsule (100 mg total) by mouth 2 (two) times daily.  . INGREZZA 80 MG CAPS Take 80 mg by mouth daily.  Marland Kitchen levETIRAcetam (KEPPRA) 500 MG tablet Take 1,000 mg by mouth 2 (two) times daily.  Marland Kitchen lovastatin (MEVACOR) 20 MG tablet TAKE 1 TABLET (20 MG TOTAL) BY MOUTH DAILY.  . meclizine (ANTIVERT) 12.5 MG tablet Take 1 tablet (12.5 mg total) by mouth 3 (three) times daily as needed for dizziness.  . Melatonin 3 MG TABS Take 1 tablet by mouth as needed. Reported on 01/30/2016  . Multiple Vitamin (MULTIVITAMIN) tablet Take 1 tablet by mouth daily.  Marland Kitchen OLANZapine (ZYPREXA) 20 MG tablet Take 20 mg by mouth at bedtime.  . Omeprazole (PRILOSEC PO) Take 20 mg by mouth daily.   Marland Kitchen ROPINIROLE HCL PO Take 3 mg by mouth at bedtime.   . SUMAtriptan (IMITREX) 100 MG tablet take 1 tablet as needed for headache, may repeat in 2 hours.  . traZODone (DESYREL) 100 MG tablet 1-2 tablets at bedtime as needed for sleep  . zolpidem (AMBIEN) 10 MG tablet Take 10 mg by mouth at bedtime.    No facility-administered encounter medications on file as of 03/06/2018.     Activities of Daily Living No flowsheet data found.  Patient Care Team: Copland, Gay Filler, MD as PCP - General (Family Medicine)    Assessment:   This is a routine wellness examination for Theresa [name]. Physical assessment deferred to PCP.  Exercise Activities and Dietary recommendations   Diet (meal preparation, eat out, water intake, caffeinated beverages, dairy products, fruits and vegetables): {Desc; diets:16563} Breakfast: Lunch:  Dinner:      Goals    None      Fall Risk Fall Risk  06/15/2017 02/28/2017 06/05/2015 03/10/2015  Falls in the past year? Yes No Yes No  Number falls in past yr: 2 or more - 1 -  Injury with Fall? No - Yes -    Depression Screen PHQ 2/9 Scores 02/28/2017 09/19/2015 06/05/2015 03/10/2015  PHQ - 2 Score 0 0 0 0       Cognitive Function MMSE - Mini Mental State Exam 02/28/2017  Orientation to time 5  Orientation to Place 5  Registration 3  Attention/ Calculation  5  Recall 2  Language- name 2 objects 2  Language- repeat 1  Language- follow 3 step command 3  Language- read & follow direction 1  Write a sentence 1  Copy design 1  Total score 29        Immunization History  Administered Date(s) Administered  . Hepatitis B 05/16/2007, 06/16/2007  . Td 08/23/1994, 09/24/1999, 02/28/2017  . Tdap 05/16/2007    Screening Tests Health Maintenance  Topic Date Due  . HIV Screening  04/02/1971  . INFLUENZA VACCINE  03/23/2018  . MAMMOGRAM  11/11/2019  . PAP SMEAR  04/19/2020  . COLONOSCOPY  06/16/2022  . TETANUS/TDAP  03/01/2027  . Hepatitis C Screening  Completed      Plan:   ***   I have personally reviewed and noted the following in the patient's chart:   . Medical and social history . Use of alcohol, tobacco or illicit drugs  . Current medications and supplements . Functional ability and status . Nutritional status . Physical activity . Advanced directives . List of other physicians . Hospitalizations, surgeries, and ER visits in previous 12 months . Vitals . Screenings to include cognitive, depression, and falls . Referrals and appointments  In addition, I have reviewed and discussed with patient certain preventive protocols, quality metrics, and best practice recommendations. A written personalized care plan for preventive services as well as general preventive health recommendations were provided to patient.     Shela Nevin, [place]  03/01/2018

## 2018-03-06 ENCOUNTER — Ambulatory Visit: Payer: Medicare Other | Admitting: *Deleted

## 2018-03-11 ENCOUNTER — Encounter: Payer: Self-pay | Admitting: Family Medicine

## 2018-03-30 IMAGING — DX DG KNEE COMPLETE 4+V*L*
4 series · 4 of 4 positions shown · non-contrast
Comparison: None.

CLINICAL DATA: 59-year-old female status post fall onto left knee
yesterday with pain. Initial encounter.

EXAM:
LEFT KNEE - COMPLETE 4+ VIEW

[knee ap]
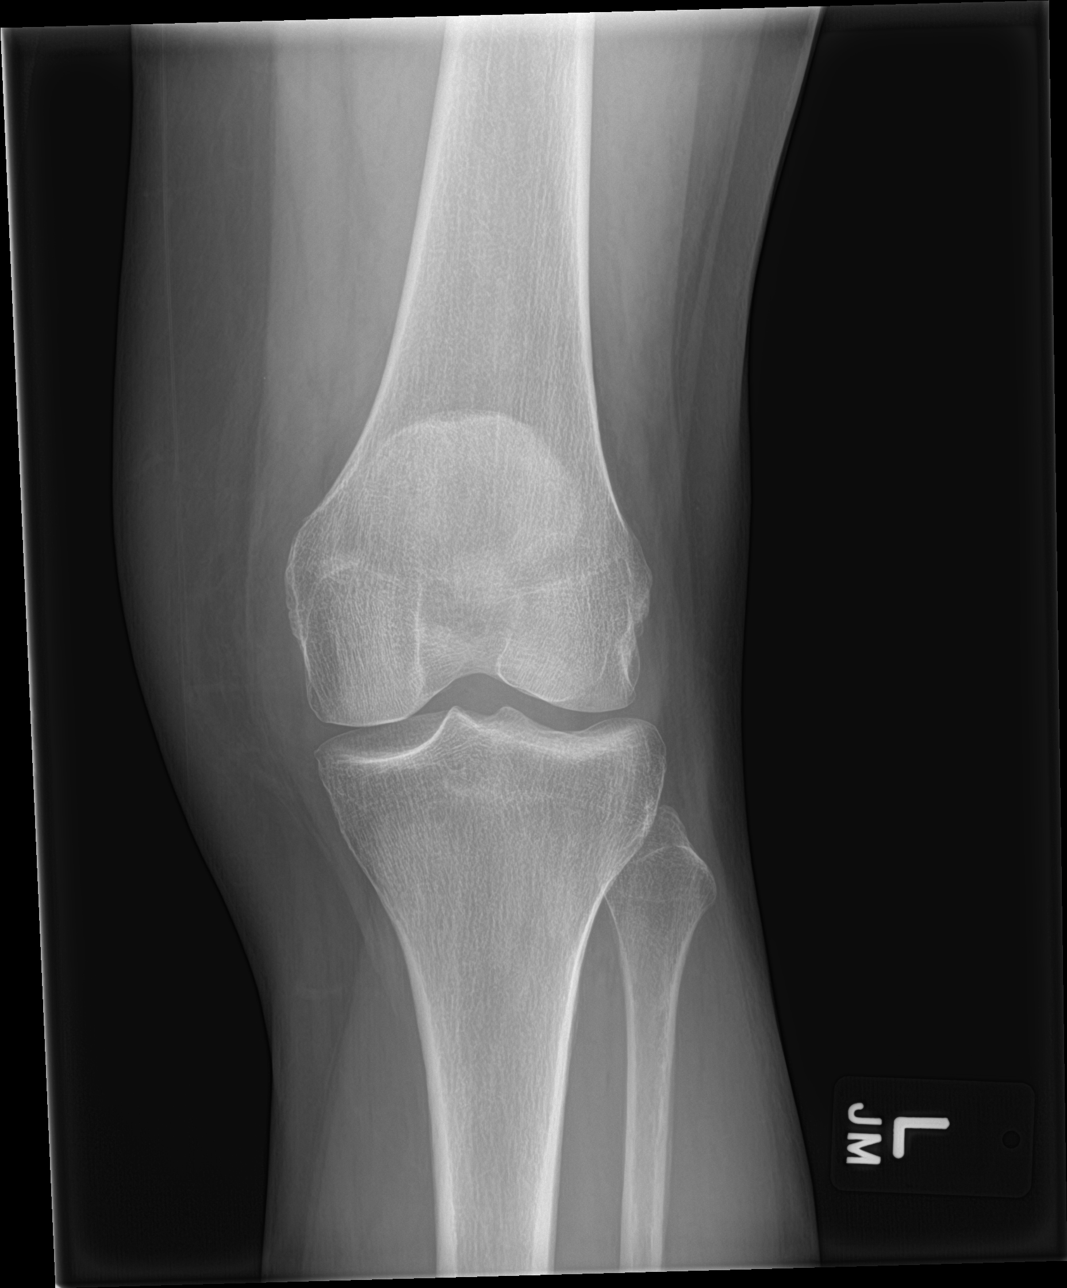

[knee lat]
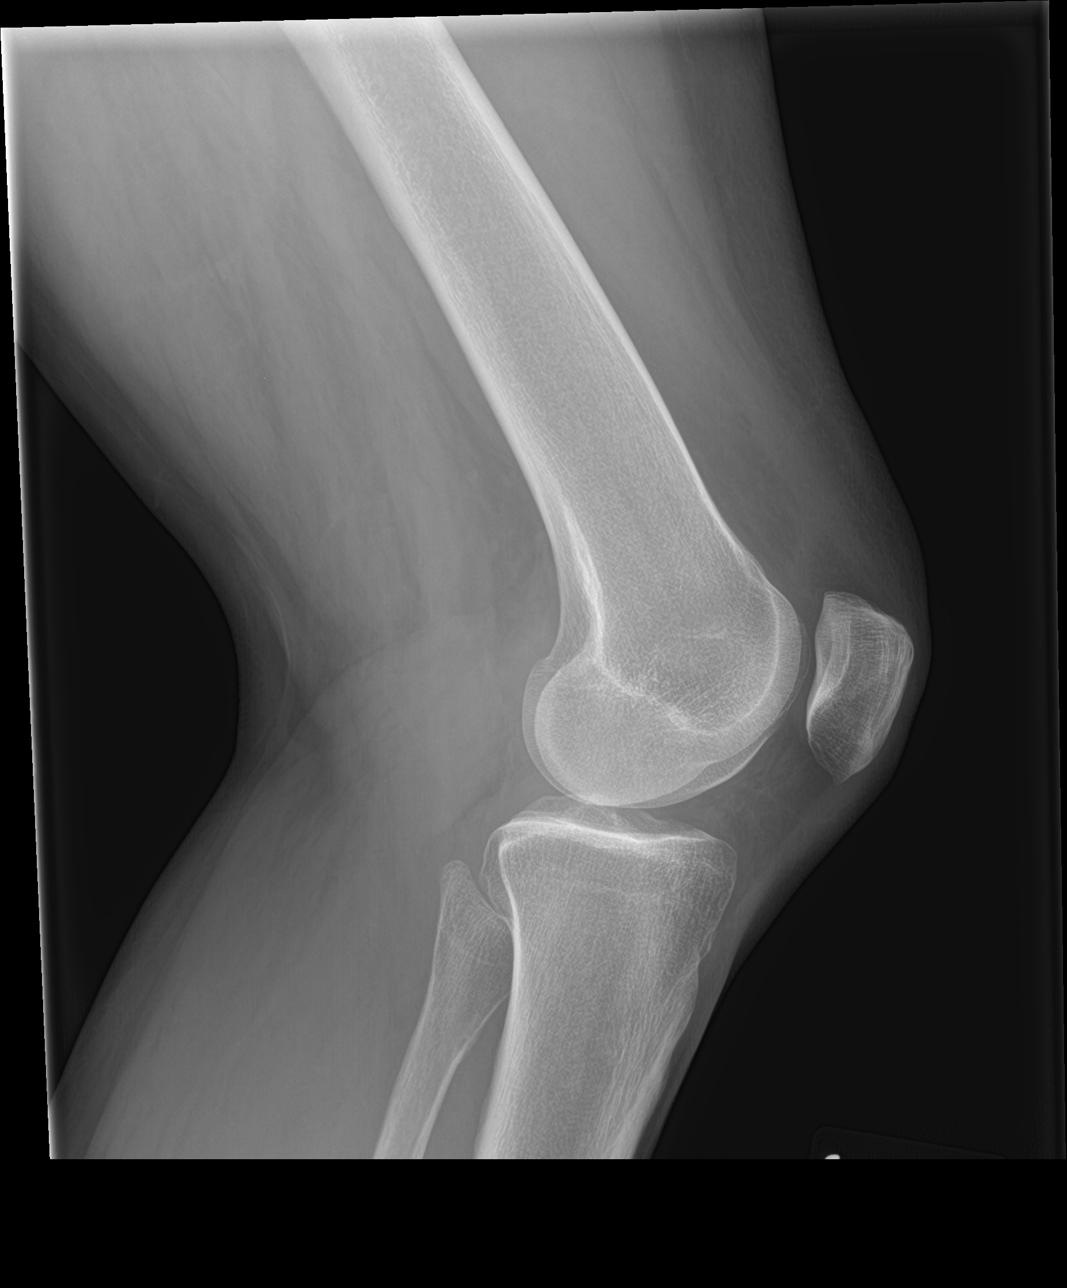

[knee obl (1 of 2)]
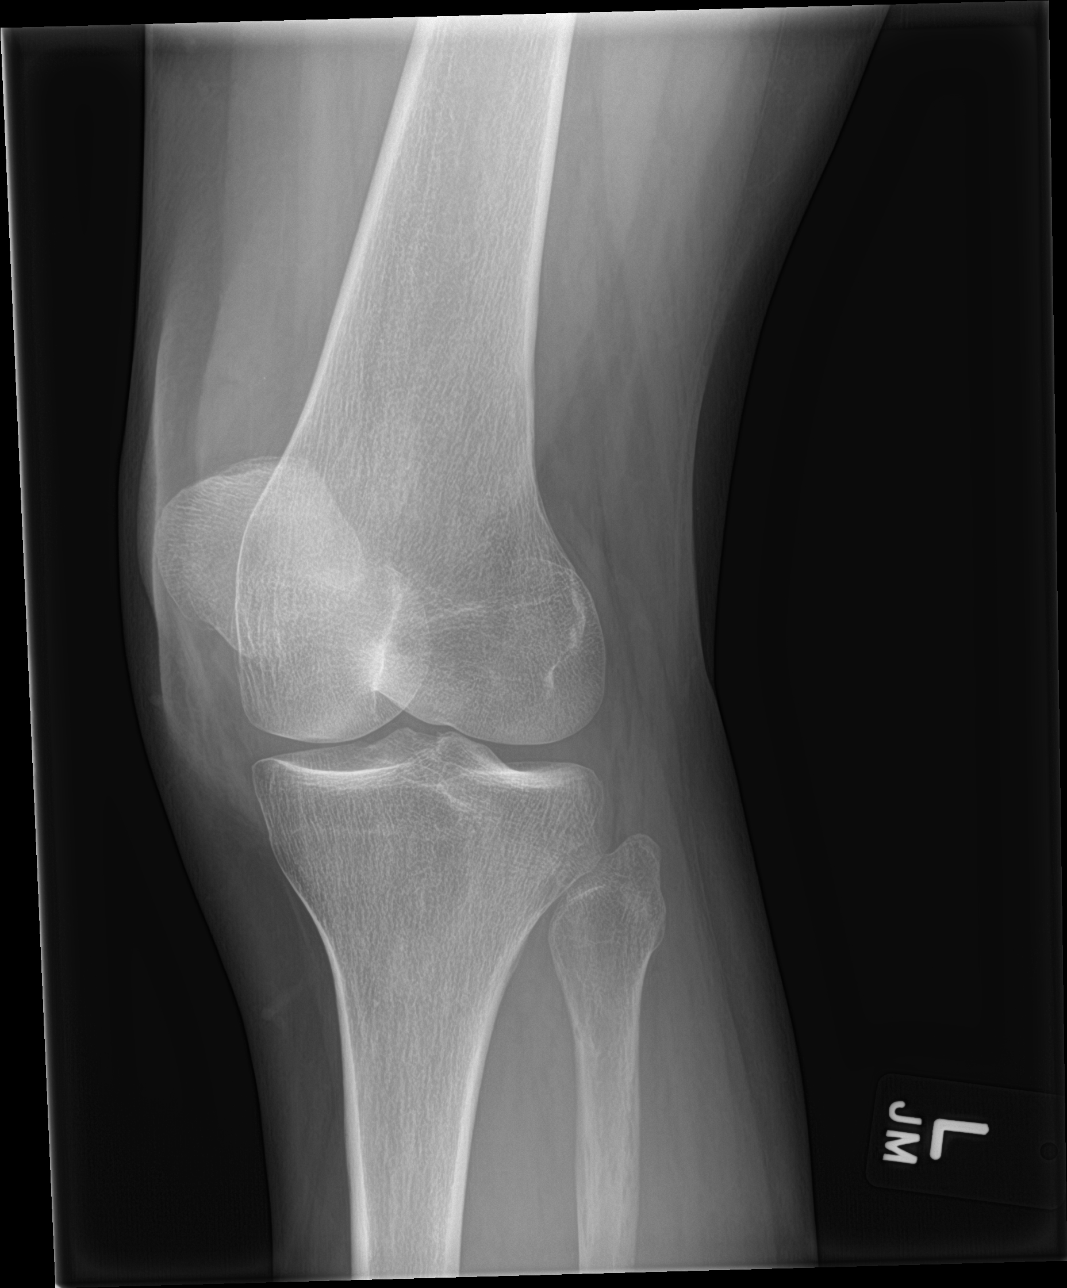

[knee obl (2 of 2)]
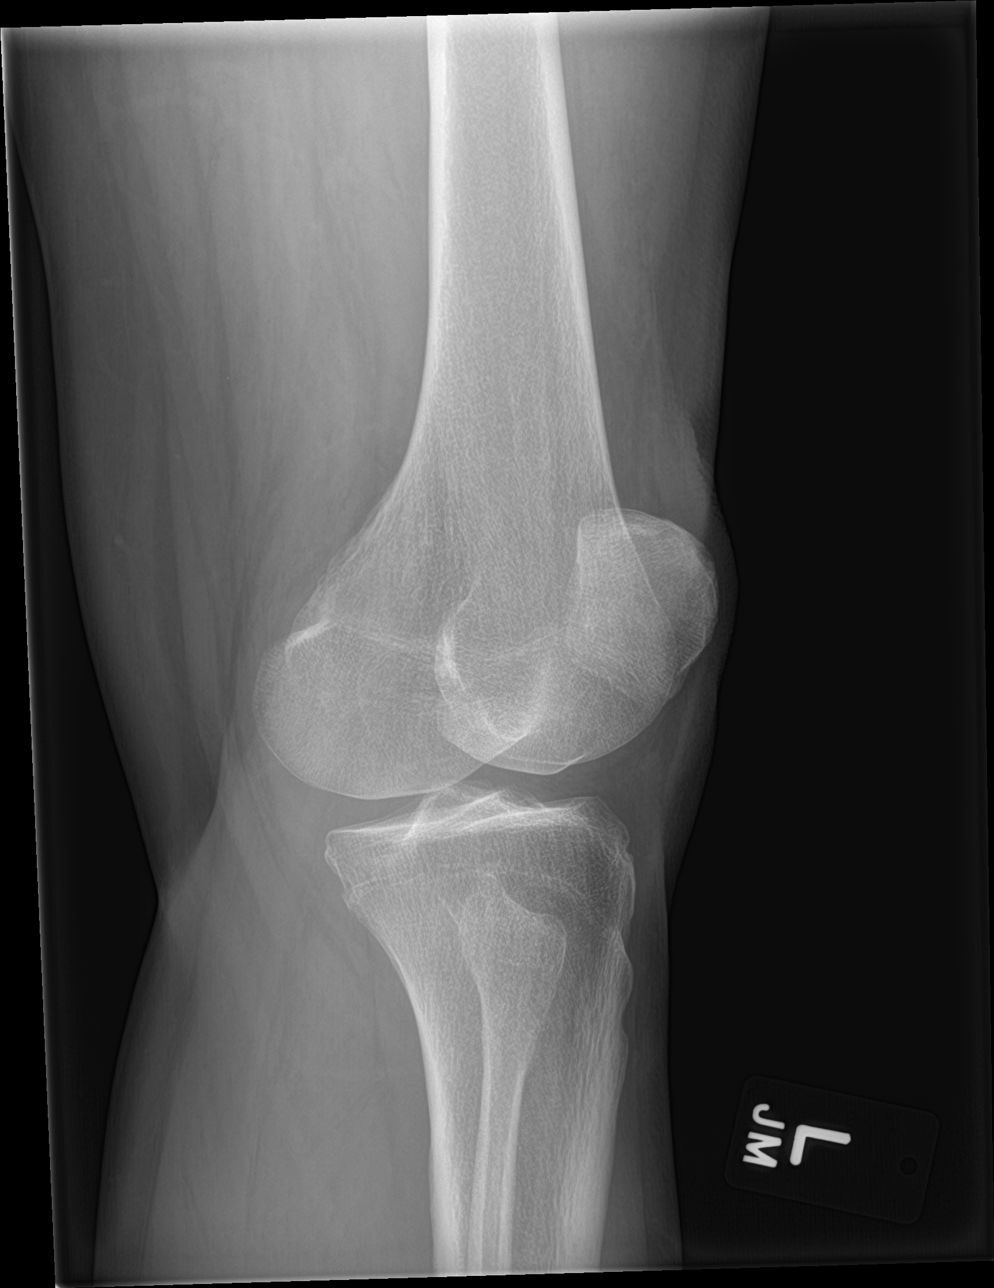

[4 of 4 positions shown; findings below may reference images not displayed]

FINDINGS: Bone mineralization is within normal limits. Patella intact. Joint
spaces and alignment are preserved. No definite joint effusion. No
acute osseous abnormality identified.
IMPRESSION: No acute fracture or dislocation identified about the left knee.

## 2018-05-11 ENCOUNTER — Other Ambulatory Visit (HOSPITAL_COMMUNITY)
Admission: RE | Admit: 2018-05-11 | Discharge: 2018-05-11 | Disposition: A | Discharge status: Home / Self Care | Payer: Medicare Other | Source: Ambulatory Visit | Attending: Obstetrics and Gynecology | Admitting: Obstetrics and Gynecology

## 2018-05-11 ENCOUNTER — Other Ambulatory Visit: Payer: Self-pay | Admitting: Obstetrics and Gynecology

## 2018-05-11 DIAGNOSIS — Z9189 Other specified personal risk factors, not elsewhere classified: Secondary | ICD-10-CM | POA: Diagnosis not present

## 2018-05-11 DIAGNOSIS — Z01411 Encounter for gynecological examination (general) (routine) with abnormal findings: Secondary | ICD-10-CM | POA: Insufficient documentation

## 2018-05-12 LAB — CYTOLOGY - PAP
Diagnosis: NEGATIVE
HPV 16/18/45 genotyping: NEGATIVE
HPV: DETECTED — AB

## 2018-05-19 ENCOUNTER — Encounter: Payer: Self-pay | Admitting: *Deleted

## 2018-05-25 ENCOUNTER — Telehealth: Payer: Self-pay

## 2018-05-26 ENCOUNTER — Ambulatory Visit: Payer: Medicare Other | Admitting: Neurology

## 2018-05-26 ENCOUNTER — Other Ambulatory Visit: Payer: Self-pay

## 2018-05-26 ENCOUNTER — Encounter: Payer: Self-pay | Admitting: Neurology

## 2018-05-26 VITALS — BP 112/74 | HR 81 | Ht 66.0 in | Wt 166.0 lb

## 2018-05-26 DIAGNOSIS — G40309 Generalized idiopathic epilepsy and epileptic syndromes, not intractable, without status epilepticus: Secondary | ICD-10-CM

## 2018-05-26 MED ORDER — CLONAZEPAM 1 MG PO TABS: ORAL_TABLET | ORAL | 0 refills | Status: DC
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
## 2018-05-26 NOTE — Telephone Encounter (Signed)
Rx sent to pharmacy   

## 2018-05-26 NOTE — Patient Instructions (Signed)
1. Continue Keppra (Levetiracetam) 500mg  twice a day and Equetro (carbamazepine) 300mg  twice a day. Let me know if you need our office to send refills  2. Continue working with your psychiatry and therapist  3. Follow-up in 6-8 months, call for any changes  Seizure Precautions: 1. If medication has been prescribed for you to prevent seizures, take it exactly as directed.  Do not stop taking the medicine without talking to your doctor first, even if you have not had a seizure in a long time.   2. Avoid activities in which a seizure would cause danger to yourself or to others.  Don't operate dangerous machinery, swim alone, or climb in high or dangerous places, such as on ladders, roofs, or girders.  Do not drive unless your doctor says you may.  3. If you have any warning that you may have a seizure, lay down in a safe place where you can't hurt yourself.    4.  No driving for 6 months from last seizure, as per [facility].   Please refer to the following link on the [place] website for more information: http://[email or web address].org/answerplace/Social/driving/drivingu.cfm   5.  Maintain good sleep hygiene. Avoid alcohol.  6.  Contact your doctor if you have any problems that may be related to the medicine you are taking.  7.  Call 911 and bring the patient back to the ED if:        A.  The seizure lasts longer than 5 minutes.       B.  The patient doesn't awaken shortly after the seizure  C.  The patient has new problems such as difficulty seeing, speaking or moving  D.  The patient was injured during the seizure  E.  The patient has a temperature over 102 F (39C)  F.  The patient vomited and now is having trouble breathing

## 2018-06-05 ENCOUNTER — Ambulatory Visit: Payer: Medicare Other | Admitting: Psychiatry

## 2018-06-05 ENCOUNTER — Encounter: Payer: Self-pay | Admitting: Psychiatry

## 2018-06-05 VITALS — BP 111/80 | HR 72

## 2018-06-05 DIAGNOSIS — F3173 Bipolar disorder, in partial remission, most recent episode manic: Secondary | ICD-10-CM | POA: Diagnosis not present

## 2018-06-05 DIAGNOSIS — F4310 Post-traumatic stress disorder, unspecified: Secondary | ICD-10-CM

## 2018-06-05 DIAGNOSIS — F5101 Primary insomnia: Secondary | ICD-10-CM

## 2018-06-05 MED ORDER — CARBAMAZEPINE ER 300 MG PO CP12: 2.0000 | ORAL_CAPSULE | Freq: Every day | ORAL | 5 refills | Status: DC

## 2018-06-05 NOTE — Progress Notes (Signed)
Theresa [name] [id number] 01-09-1956 62 y.o.  Subjective:   Patient ID:  Theresa [name] is a 62 y.o. (DOB [birth year]) female.  Chief Complaint:  Chief Complaint  Patient presents with  . Follow-up    Bipolar, Anxiety, insomnia    HPI Theresa [name] presents to the office today for follow-up of mood, anxiety, and insomnia. She reports that she has had some challenges with finding a new therapist and has an apt for first visit late November. Reports some increased depression after learning that she was not going to be able to continue with therapist she saw initially. Reports some mild depression. Reports moderate anxiety on a daily basis with feeling nervous. Denies anxious thoughts. Denies any recent manic s/s or irritable. She reports that her appetite is "fine." Describes sleep as "good." Pt reports "I always have problems with motivation" and denies any recent change. Denies SI.   Bathrooms have been getting remodeled and husband reports that this has caused her some anxiety. He reports that are going to be traveling some over the next several weeks. Husband reports that he has not noticed any recent manic s/s. Husband reports that he performs most tasks that requiring planning, prioritizing, decision making, and concentration. He reports that she often has difficulty deciding what to eat and will eat the same things he does.   Saw neurologist for initial visit and they report no changes were made.   Medications: I have reviewed the patient's current medications.  Medication Side Effects: None  Current Medications: No current outpatient medications on file.   No current facility-administered medications for this visit.    Allergies:  Allergies  Allergen Reactions  . Lamictal [Lamotrigine]     Acute renal failure  . Penicillins     Abdominal pain  . Codeine Swelling and Rash    Past Medical History:  Diagnosis Date  . Adenomatous colon polyp   . Allergy   . Anxiety    . Bipolar 1 disorder ([place])   . Blood transfusion without reported diagnosis   . Depression   . Elevated LFTs   . Hepatic steatosis 05/07/13  . Hyperlipidemia   . Seizures ([place])   . Tardive dyskinesia     Family History  Problem Relation Age of Onset  . Colon polyps Mother   . Cancer Mother   . Heart disease Mother   . Alcoholism Mother   . Cancer Father   . Alcoholism Father   . Bipolar disorder Father   . Drug abuse Brother   . Bipolar disorder Other   . Colon cancer Neg Hx   . Stomach cancer Neg Hx   . Breast cancer Neg Hx     Social History   Socioeconomic History  . Marital status: Married    Spouse name: Not on file  . Number of children: Not on file  . Years of education: Not on file  . Highest education level: Not on file  Occupational History  . Not on file  Social Needs  . Financial resource strain: Not on file  . Food insecurity:    Worry: Not on file    Inability: Not on file  . Transportation needs:    Medical: Not on file    Non-medical: Not on file  Tobacco Use  . Smoking status: Never Smoker  . Smokeless tobacco: Never Used  Substance and Sexual Activity  . Alcohol use: Yes    Comment: occasionaly 1 a month  . Drug use:  No  . Sexual activity: Not Currently  Lifestyle  . Physical activity:    Days per week: Not on file    Minutes per session: Not on file  . Stress: Not on file  Relationships  . Social connections:    Talks on phone: Not on file    Gets together: Not on file    Attends religious service: Not on file    Active member of club or organization: Not on file    Attends meetings of clubs or organizations: Not on file    Relationship status: Not on file  . Intimate partner violence:    Fear of current or ex partner: Not on file    Emotionally abused: Not on file    Physically abused: Not on file    Forced sexual activity: Not on file  Other Topics Concern  . 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   Please see review of systems for further details on the patient's review from today.   Review of Systems:  Review of Systems  Musculoskeletal: Negative.   Neurological: Negative for seizures.  Psychiatric/Behavioral: Positive for decreased concentration. Negative for suicidal ideas. The patient is nervous/anxious.     Objective:   Physical Exam:  BP 111/80   Pulse 72   Physical Exam  Constitutional: She is oriented to person, place, and time. She appears well-developed. No distress.  Musculoskeletal: Normal range of motion.  Neurological: She is alert and oriented to person, place, and time. Coordination normal.  Psychiatric: Her speech is normal and behavior is normal. Judgment and thought content normal. Her mood appears anxious. Her affect is not angry, not blunt, not labile and not inappropriate. Cognition and memory are impaired. She does not exhibit a depressed mood. She expresses no homicidal and no suicidal ideation. She expresses no suicidal plans and no homicidal plans.  Pt presents as mildly anxious at times.     Lab Review:     Component Value Date/Time   NA 139 06/13/2017 1515   K 3.9 06/13/2017 1515   CL 101 06/13/2017 1515   CO2 29 06/13/2017 1515   GLUCOSE 92 06/13/2017 1515   BUN 12 06/13/2017 1515   CREATININE 0.78 06/13/2017 1515   CREATININE 0.91 09/19/2015 0959   CALCIUM 9.2 06/13/2017 1515   PROT 6.9 06/13/2017 1515   ALBUMIN 4.3 06/13/2017 1515   AST 22 06/13/2017 1515   ALT 26 06/13/2017 1515   ALKPHOS 116 06/13/2017 1515   BILITOT 0.4 06/13/2017 1515       Component Value Date/Time   WBC 6.4 06/13/2017 1515   RBC 4.99 06/13/2017 1515   HGB 14.7 06/13/2017 1515   HCT 45.0 06/13/2017 1515   PLT 230.0 06/13/2017 1515    MCV 90.2 06/13/2017 1515   MCV 87.7 02/07/2013 1110   MCH 28.3 03/10/2015 0957   MCHC 32.8 06/13/2017 1515   RDW 13.9 06/13/2017 1515    No results found for: POCLITH, LITHIUM   Lab Results  Component Value Date   CBMZ 13.2 (H) 02/28/2017     .res Assessment: Plan:   Pt seen for 30 minutes and greater than 50% of session spent counseling patient and coordination of care  to include reviewing notes from neurology visit during session, and discussing patient establishing care with a new therapist.  Discussed continue with plan to see therapist for initial visit mid November.  Recommended also contacting office to find out if they have had any cancellations so that she could be seen earlier if possible.  Patient reports that medications are adequately controlling target symptoms at this time and that she is having less tolerability issues compared to the past and no longer experiencing excessive daytime somnolence.  We will therefore continue current plan of care.  Patient reports that she prefers to have her pharmacy contact office when refills are needed instead of having refill sent at this time.  Patient advised to contact office with any worsening signs and symptoms. Manic bipolar I disorder in partial remission ([place])  Posttraumatic stress disorder  Primary insomnia  Please see After Visit Summary for patient specific instructions.  Future Appointments  Date Time Provider [place]  07/11/2018 12:00 PM Royetta Crochet [facility] None  07/27/2018  1:00 PM Cottle, Bambi G, LCSW [facility] None  08/07/2018 10:30 AM [name], PMHNP [facility] None  01/18/2019 10:00 AM Cameron Sprang, MD [facility] None    No orders of the defined types were placed in this encounter.     -------------------------------

## 2018-07-03 ENCOUNTER — Other Ambulatory Visit: Payer: Self-pay

## 2018-07-03 MED ORDER — TRAZODONE HCL 100 MG PO TABS: ORAL_TABLET | ORAL | 1 refills | Status: DC

## 2018-07-04 ENCOUNTER — Other Ambulatory Visit: Payer: Self-pay | Admitting: Obstetrics and Gynecology

## 2018-07-11 ENCOUNTER — Ambulatory Visit: Payer: Medicare Other | Admitting: Psychology

## 2018-07-12 ENCOUNTER — Other Ambulatory Visit: Payer: Self-pay

## 2018-07-12 MED ORDER — ROPINIROLE HCL 3 MG PO TABS: 3.0000 mg | ORAL_TABLET | Freq: Every day | ORAL | 2 refills | Status: DC

## 2018-07-24 ENCOUNTER — Other Ambulatory Visit: Payer: Self-pay | Admitting: Family Medicine

## 2018-07-24 ENCOUNTER — Other Ambulatory Visit: Payer: Self-pay

## 2018-07-24 MED ORDER — OLANZAPINE 15 MG PO TABS: 15.0000 mg | ORAL_TABLET | Freq: Every day | ORAL | 0 refills | Status: DC

## 2018-07-25 ENCOUNTER — Encounter: Payer: Self-pay | Admitting: Emergency Medicine

## 2018-07-25 ENCOUNTER — Ambulatory Visit (INDEPENDENT_AMBULATORY_CARE_PROVIDER_SITE_OTHER): Payer: Medicare Other | Admitting: Psychology

## 2018-07-25 ENCOUNTER — Ambulatory Visit: Payer: Medicare Other | Admitting: Psychology

## 2018-07-25 DIAGNOSIS — F4481 Dissociative identity disorder: Secondary | ICD-10-CM | POA: Diagnosis not present

## 2018-07-25 DIAGNOSIS — F4310 Post-traumatic stress disorder, unspecified: Secondary | ICD-10-CM | POA: Insufficient documentation

## 2018-07-25 DIAGNOSIS — F3173 Bipolar disorder, in partial remission, most recent episode manic: Secondary | ICD-10-CM | POA: Insufficient documentation

## 2018-07-27 ENCOUNTER — Ambulatory Visit: Payer: Medicare Other | Admitting: Psychology

## 2018-08-07 ENCOUNTER — Ambulatory Visit: Payer: Medicare Other | Admitting: Psychiatry

## 2018-08-07 ENCOUNTER — Encounter: Payer: Self-pay | Admitting: Psychiatry

## 2018-08-07 VITALS — BP 114/83 | HR 74

## 2018-08-07 DIAGNOSIS — F5101 Primary insomnia: Secondary | ICD-10-CM | POA: Diagnosis not present

## 2018-08-07 DIAGNOSIS — F4310 Post-traumatic stress disorder, unspecified: Secondary | ICD-10-CM | POA: Diagnosis not present

## 2018-08-07 DIAGNOSIS — F3173 Bipolar disorder, in partial remission, most recent episode manic: Secondary | ICD-10-CM

## 2018-08-07 DIAGNOSIS — G2401 Drug induced subacute dyskinesia: Secondary | ICD-10-CM | POA: Diagnosis not present

## 2018-08-07 MED ORDER — OLANZAPINE 10 MG PO TABS: 10.0000 mg | ORAL_TABLET | Freq: Every day | ORAL | 1 refills | Status: DC

## 2018-08-07 NOTE — Progress Notes (Signed)
Theresa [name] [id number] [birth year] 62 y.o.  Subjective:   Patient ID:  Theresa [name] is a 62 y.o. (DOB 1955-09-11) female.  Chief Complaint:  Chief Complaint  Patient presents with  . Anxiety  . Follow-up    h/o depression, insomnia, RLS    HPI Theresa [name] presents to the office today for follow-up. She is accompanied by her husband. She reports that she has recently noticed some clicking in her tongue and tremor in hands. She reports that she has noticed her tongue clicking "for awhile" and that tremors began  In the last few weeks.   "Mood has been fine." Reports that she has had some highs and lows, "but not real low." Denies extreme highs or lows. Reports that she had some increased sadness when long-time therapist retired. Reports that she is noticing more anxiety, particularly in the morning. Describes anxiety as a nervous feeling and some restlessness "want to crawl out of my skin" and pace. Denies anxious thoughts. 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 "Sleep has been doing good. " Reports having about one sleepless night a week. Appetite has been fine. Reports that sleepiness during the day has improved. Reports energy and motivation have been low. No change in memory or concentration. Denies SI.   Has seen Theresa Hocking, PhD for therapy and this went well.  Past Psychiatric Medication Trials: Olanzapine Seroquel Saphris Depakote-self-injurious behavior Lamictal Keppra Carbamazepine Xanax Klonopin Ambien Trazodone Benztropine Ingrezza   Review of Systems:  Review of Systems  Genitourinary:       Recent cervical biopsy that was benign  Musculoskeletal: Negative for gait problem.  Neurological: Positive for tremors.  Psychiatric/Behavioral:       Please refer to HPI    Medications: I have reviewed the patient's current medications.  Current Outpatient  Medications  Medication Sig Dispense Refill  . clonazePAM (KLONOPIN) 0.5 MG tablet Take 0.5 mg by mouth daily at 12 noon.    . clonazePAM (KLONOPIN) 1 MG tablet Take 1 tablet at bedtime, may also have 1 tablet daily as needed for anxiety. 60 tablet 0  . INGREZZA 80 MG CAPS Take 80 mg by mouth daily.    Marland Kitchen levETIRAcetam (KEPPRA) 500 MG tablet Take 500 mg by mouth 2 (two) times daily.    Marland Kitchen lovastatin (MEVACOR) 20 MG tablet TAKE 1 TABLET (20 MG TOTAL) BY MOUTH DAILY. 30 tablet 0  . Melatonin 10 MG TABS Take 1 tablet by mouth as needed. Reported on 01/30/2016     . Multiple Vitamin (MULTIVITAMIN) tablet Take 1 tablet by mouth daily.    Marland Kitchen OLANZapine (ZYPREXA) 15 MG tablet Take 1 tablet (15 mg total) by mouth at bedtime. 90 tablet 0  . Omeprazole (PRILOSEC PO) Take 20 mg by mouth daily.     . propranolol (INDERAL) 10 MG tablet Take 10 mg by mouth 3 (three) times daily. Take 1-2 tabs po BID prn anxiety    . rOPINIRole (REQUIP) 3 MG tablet Take 1 tablet (3 mg total) by mouth at bedtime. 30 tablet 2  . traZODone (DESYREL) 100 MG tablet 1-2 tablets at bedtime as needed for sleep 60 tablet 1  . zolpidem (AMBIEN) 10 MG tablet Take 10 mg by mouth at bedtime.     . Carbamazepine (EQUETRO) 300 MG CP12 Take 2 capsules (600 mg total) by mouth at bedtime. 60 each 5  . OLANZapine (  ZYPREXA) 10 MG tablet Take 1 tablet (10 mg total) by mouth at bedtime. 30 tablet 1  . ROPINIROLE HCL PO Take 3 mg by mouth at bedtime.     . SUMAtriptan (IMITREX) 100 MG tablet take 1 tablet as needed for headache, may repeat in 2 hours. 10 tablet 2   No current facility-administered medications for this visit.     Medication Side Effects: Other: Tongue clicking, tremor, fatigue, restlessness  Allergies:  Allergies  Allergen Reactions  . Lamictal [Lamotrigine]     Acute renal failure  . Penicillins     Abdominal pain  . Codeine Swelling and Rash    Past Medical History:  Diagnosis Date  . Adenomatous colon polyp   . Allergy    . Anxiety   . Bipolar 1 disorder ([place])   . Blood transfusion without reported diagnosis   . Depression   . Elevated LFTs   . Hepatic steatosis 05/07/13  . Hyperlipidemia   . Seizures ([place])   . Tardive dyskinesia     Family History  Problem Relation Age of Onset  . Colon polyps Mother   . Cancer Mother   . Heart disease Mother   . Alcoholism Mother   . Cancer Father   . Alcoholism Father   . Bipolar disorder Father   . Drug abuse Brother   . Bipolar disorder Other   . Colon cancer Neg Hx   . Stomach cancer Neg Hx   . Breast cancer Neg Hx     Social History   Socioeconomic History  . Marital status: Married    Spouse name: Not on file  . Number of children: Not on file  . Years of education: Not on file  . Highest education level: Not on file  Occupational History  . Not on file  Social Needs  . Financial resource strain: Not on file  . Food insecurity:    Worry: Not on file    Inability: Not on file  . Transportation needs:    Medical: Not on file    Non-medical: Not on file  Tobacco Use  . Smoking status: Never Smoker  . Smokeless tobacco: Never Used  Substance and Sexual Activity  . Alcohol use: Yes    Comment: occasionaly 1 a month  . Drug use: No  . Sexual activity: Not Currently  Lifestyle  . Physical activity:    Days per week: Not on file    Minutes per session: Not on file  . Stress: Not on file  Relationships  . Social connections:    Talks on phone: Not on file    Gets together: Not on file    Attends religious service: Not on file    Active member of club or organization: Not on file    Attends meetings of clubs or organizations: Not on file    Relationship status: Not on file  . Intimate partner violence:    Fear of current or ex partner: Not on file    Emotionally abused: Not on file    Physically abused: Not on file    Forced sexual activity: Not on file  Other Topics Concern  . 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   Please see review of systems for further details on the patient's review from today.   Objective:   Physical Exam:  BP 114/83   Pulse 74   Physical Exam Constitutional:      General: She is not in acute distress.    Appearance: She is well-developed.  Musculoskeletal:        General: No deformity.  Neurological:     Mental Status: She is alert and oriented to person, place, and time.     Coordination: Coordination normal.  Psychiatric:        Mood and Affect: Mood is anxious. Mood is not depressed. Affect is not labile, blunt, angry or inappropriate.        Speech: Speech normal.        Behavior: Behavior normal.        Thought Content: Thought content normal. Thought content does not include homicidal or suicidal ideation. Thought content does not include homicidal or suicidal plan.        Judgment: Judgment normal.     Comments: Restless at times. Insight intact. No auditory or visual hallucinations. No delusions.      Lab Review:     Component Value Date/Time   NA 139 06/13/2017 1515   K 3.9 06/13/2017 1515   CL 101 06/13/2017 1515   CO2 29 06/13/2017 1515   GLUCOSE 92 06/13/2017 1515   BUN 12 06/13/2017 1515   CREATININE 0.78 06/13/2017 1515   CREATININE 0.91 09/19/2015 0959   CALCIUM 9.2 06/13/2017 1515   PROT 6.9 06/13/2017 1515   ALBUMIN 4.3 06/13/2017 1515   AST 22 06/13/2017 1515   ALT 26 06/13/2017 1515   ALKPHOS 116 06/13/2017 1515   BILITOT 0.4 06/13/2017 1515       Component Value Date/Time   WBC 6.4 06/13/2017 1515   RBC 4.99 06/13/2017 1515   HGB 14.7 06/13/2017 1515   HCT 45.0 06/13/2017 1515   PLT 230.0 06/13/2017 1515   MCV 90.2 06/13/2017 1515   MCV 87.7 02/07/2013 1110   MCH 28.3 03/10/2015 0957   MCHC  32.8 06/13/2017 1515   RDW 13.9 06/13/2017 1515    No results found for: POCLITH, LITHIUM   Lab Results  Component Value Date   CBMZ 13.2 (H) 02/28/2017     .res Assessment: Plan:   Pt seen for 30 minutes and greater than 50% of session spent counseling pt re: potential benefits, risks, and side effects of decreasing Zyprexa to 10 mg po QHS. Discussed that decrease in dose may improve fatigue and low motivation, and possibly tardive dyskinesia. Discussed that tardive dyskinesia may also potentially worsen with dose decreases. Advised pt and her husband to contact office with any worsening s/s, such as insomnia, anxiety, or manic s/s.  Will decrease Zyprexa to 10 mg po QHS to minimize possible side effects.  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 Some recent worsening - Plan: OLANZapine (ZYPREXA) 10 MG tablet  Tardive dyskinesia - Some recent worsening  Primary insomnia - Chronic. Stable - Plan: OLANZapine (ZYPREXA) 10 MG tablet  Please see After Visit Summary for patient specific instructions.  Future Appointments  Date Time Provider [place]  08/28/2018  3:00 PM Doree Fudge, PhD [facility] None  10/09/2018 10:00 AM [name], PMHNP [facility] None  01/18/2019 10:00 AM Cameron Sprang, MD [facility] None    No orders  of the defined types were placed in this encounter.     -------------------------------

## 2018-08-12 ENCOUNTER — Other Ambulatory Visit: Payer: Self-pay | Admitting: Psychiatry

## 2018-08-14 ENCOUNTER — Telehealth: Payer: Self-pay | Admitting: Psychiatry

## 2018-08-14 ENCOUNTER — Ambulatory Visit: Payer: Medicare Other | Admitting: Psychology

## 2018-08-14 NOTE — Telephone Encounter (Signed)
Pt request refill on Ambien at [facility]. Next appt 2/17

## 2018-08-14 NOTE — Telephone Encounter (Signed)
Already done

## 2018-08-19 ENCOUNTER — Encounter: Payer: Self-pay | Admitting: Family Medicine

## 2018-08-21 ENCOUNTER — Other Ambulatory Visit: Payer: Self-pay | Admitting: Family Medicine

## 2018-08-28 ENCOUNTER — Ambulatory Visit (INDEPENDENT_AMBULATORY_CARE_PROVIDER_SITE_OTHER): Payer: Medicare Other | Admitting: Psychology

## 2018-08-28 DIAGNOSIS — F4481 Dissociative identity disorder: Secondary | ICD-10-CM | POA: Diagnosis not present

## 2018-09-01 ENCOUNTER — Other Ambulatory Visit: Payer: Self-pay | Admitting: Psychiatry

## 2018-09-01 ENCOUNTER — Telehealth: Payer: Self-pay | Admitting: Psychiatry

## 2018-09-01 MED ORDER — PROPRANOLOL HCL 10 MG PO TABS: ORAL_TABLET | ORAL | 1 refills | Status: DC

## 2018-09-01 MED ORDER — CLONAZEPAM 1 MG PO TABS: ORAL_TABLET | ORAL | 1 refills | Status: DC

## 2018-09-01 NOTE — Telephone Encounter (Signed)
Theresa [name] called to say that she needs refills on her propranolol and clonazepam.  [facility] [place] [facility].  Appt 10/09/18

## 2018-09-01 NOTE — Telephone Encounter (Signed)
Sent to pharmacy 

## 2018-09-05 ENCOUNTER — Other Ambulatory Visit: Payer: Self-pay | Admitting: Psychiatry

## 2018-09-15 ENCOUNTER — Ambulatory Visit (HOSPITAL_COMMUNITY)
Admission: EM | Admit: 2018-09-15 | Discharge: 2018-09-15 | Disposition: A | Discharge status: Home / Self Care | Payer: Medicare Other | Attending: Family Medicine | Admitting: Family Medicine

## 2018-09-15 ENCOUNTER — Encounter (HOSPITAL_COMMUNITY): Payer: Self-pay | Admitting: Emergency Medicine

## 2018-09-15 DIAGNOSIS — M5010 Cervical disc disorder with radiculopathy, unspecified cervical region: Secondary | ICD-10-CM | POA: Insufficient documentation

## 2018-09-15 MED ORDER — HYDROCODONE-ACETAMINOPHEN 5-325 MG PO TABS: 1.0000 | ORAL_TABLET | ORAL | 0 refills | Status: DC | PRN

## 2018-09-15 MED ORDER — METHYLPREDNISOLONE 4 MG PO TBPK: ORAL_TABLET | ORAL | 0 refills | Status: DC

## 2018-09-15 MED ORDER — TIZANIDINE HCL 4 MG PO TABS: 4.0000 mg | ORAL_TABLET | Freq: Four times a day (QID) | ORAL | 0 refills | Status: DC | PRN

## 2018-09-15 NOTE — ED Provider Notes (Signed)
[name]    CSN: [id number] Arrival date & time: 09/15/18  1005     History   Chief Complaint Chief Complaint  Patient presents with  . Neck Pain    HPI Theresa [name] is a 63 y.o. female.   HPI  Patient is here for neck pain.  It radiates into her right shoulder blade and right upper arm.  She is starting to have some tingling numbness into her right fingers.  She states she has normal dexterity and strength.  She is largely disabled at home for mental illness.  She is well controlled.  She is here with her husband.  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  She has been able to continue this activity.  She had no accident.  She had no injury.  She had no overuse.  No change in activity.  I discussed with her that she did have an x-ray done in 2015.  It showed cervical degenerative disc disease at the C5-6  level.  This old film was pulled up and shown to the patient and her husband.  She denies any prior symptoms of neck pain or arm pain.  She has been taking ibuprofen 6 to 800 mg as needed.  This provides some temporary relief.  Past Medical History:  Diagnosis Date  . Adenomatous colon polyp   . Allergy   . Anxiety   . Bipolar 1 disorder ([place])   . Blood transfusion without reported diagnosis   . Depression   . Elevated LFTs   . Hepatic steatosis 05/07/13  . Hyperlipidemia   . Seizures ([place])   . Tardive dyskinesia     Patient Active Problem List   Diagnosis Date Noted  . Manic bipolar I disorder in partial remission ([place]) 07/25/2018  . Posttraumatic stress disorder 07/25/2018  . Generalized idiopathic epilepsy and epileptic syndromes, not intractable, without status epilepticus ([place]) 06/24/2017  . Special screening for malignant neoplasms, colon 06/16/2012  . Personal history of colonic polyps 06/16/2012  . Bipolar disorder ([place]) 04/02/2012  . Idiopathic generalized epilepsy ([place]) 10/15/2011    Past Surgical History:   Procedure Laterality Date  . APPENDECTOMY    . COLONOSCOPY  06/16/2012   Procedure: COLONOSCOPY;  Surgeon: Lafayette Dragon, MD;  Location: [facility];  Service: Endoscopy;  Laterality: N/A;  . DILATION AND CURETTAGE OF UTERUS    . KNEE ARTHROSCOPY WITH MENISCAL REPAIR Left     OB History   No obstetric history on file.      Home Medications    Prior to Admission medications   Medication Sig Start Date End Date Taking? Authorizing Provider  Carbamazepine (EQUETRO) 300 MG CP12 Take 2 capsules (600 mg total) by mouth at bedtime. 06/05/18 08/04/18  [name], PMHNP  clonazePAM (KLONOPIN) 1 MG tablet Take 1 tablet at bedtime, may also have 1 tablet daily as needed for anxiety. 09/01/18   [name], PMHNP  HYDROcodone-acetaminophen (NORCO/VICODIN) 5-325 MG tablet Take 1 tablet by mouth every 4 (four) hours as needed. 09/15/18   Raylene Everts, MD  INGREZZA 80 MG CAPS Take 80 mg by mouth daily. 08/20/16   [provider]  levETIRAcetam (KEPPRA) 500 MG tablet Take 500 mg by mouth 2 (two) times daily. 12/06/14   [provider]  lovastatin (MEVACOR) 20 MG tablet TAKE 1 TABLET BY MOUTH EVERY DAY 08/22/18   Copland, Gay Filler, MD  Melatonin 10 MG TABS Take  1 tablet by mouth as needed. Reported on 01/30/2016     [provider]  methylPREDNISolone (MEDROL DOSEPAK) 4 MG TBPK tablet tad 09/15/18   Raylene Everts, MD  Multiple Vitamin (MULTIVITAMIN) tablet Take 1 tablet by mouth daily.    [provider]  OLANZapine (ZYPREXA) 10 MG tablet Take 1 tablet (10 mg total) by mouth at bedtime. 08/07/18 09/06/18  [name], PMHNP  Omeprazole (PRILOSEC PO) Take 20 mg by mouth daily.     [provider]  propranolol (INDERAL) 10 MG tablet Take 1-2 tabs po BID prn anxiety 09/01/18   [name], PMHNP  rOPINIRole (REQUIP) 3 MG tablet Take 1 tablet (3 mg total) by mouth at bedtime. 07/12/18   [name], PMHNP  SUMAtriptan (IMITREX) 100 MG  tablet take 1 tablet as needed for headache, may repeat in 2 hours. 02/14/12 05/26/18  Clearnce Sorrel, MD  tiZANidine (ZANAFLEX) 4 MG tablet Take 1-2 tablets (4-8 mg total) by mouth every 6 (six) hours as needed for muscle spasms. 09/15/18   Raylene Everts, MD  traZODone (DESYREL) 100 MG tablet 1-2 tablets at bedtime as needed for sleep 07/03/18   [name], PMHNP  zolpidem (AMBIEN) 10 MG tablet TAKE 1 TABLET BY MOUTH AT BEDTIME AS NEEDED 08/14/18   [name], PMHNP    Family History Family History  Problem Relation Age of Onset  . Colon polyps Mother   . Cancer Mother   . Heart disease Mother   . Alcoholism Mother   . Cancer Father   . Alcoholism Father   . Bipolar disorder Father   . Drug abuse Brother   . Bipolar disorder Other   . Colon cancer Neg Hx   . Stomach cancer Neg Hx   . Breast cancer Neg Hx     Social History Social History   Tobacco Use  . Smoking status: Never Smoker  . Smokeless tobacco: Never Used  Substance Use Topics  . Alcohol use: Yes    Comment: occasionaly 1 a month  . Drug use: No     Allergies   Lamictal [lamotrigine]; Codeine; and Penicillins   Review of Systems Review of Systems  Constitutional: Negative for chills and fever.  HENT: Negative for ear pain and sore throat.   Eyes: Negative for pain and visual disturbance.  Respiratory: Negative for cough and shortness of breath.   Cardiovascular: Negative for chest pain and palpitations.  Gastrointestinal: Negative for abdominal pain and vomiting.  Genitourinary: Negative for dysuria and hematuria.  Musculoskeletal: Positive for neck pain and neck stiffness. Negative for arthralgias and back pain.  Skin: Negative for color change and rash.  Neurological: Positive for numbness. Negative for seizures and syncope.  All other systems reviewed and are negative.    Physical Exam Triage Vital Signs ED Triage Vitals  Enc Vitals Group     BP 09/15/18 1044 140/64     Pulse  Rate 09/15/18 1044 73     Resp 09/15/18 1044 16     Temp 09/15/18 1044 98 F (36.7 C)     Temp src --      SpO2 09/15/18 1044 97 %     Weight --      Height --      Head Circumference --      Peak Flow --      Pain Score 09/15/18 1046 9     Pain Loc --      Pain Edu? --  Excl. in GC? --    No data found.  Updated Vital Signs BP 140/64   Pulse 73   Temp 98 F (36.7 C)   Resp 16   SpO2 97%   Visual Acuity Right Eye Distance:   Left Eye Distance:   Bilateral Distance:    Right Eye Near:   Left Eye Near:    Bilateral Near:     Physical Exam Constitutional:      General: She is not in acute distress.    Appearance: She is well-developed and normal weight.     Comments: Pleasant cooperative.  Poor eye contact.  HENT:     Head: Normocephalic and atraumatic.     Right Ear: Tympanic membrane and ear canal normal.     Left Ear: Tympanic membrane normal.     Nose: Nose normal.     Mouth/Throat:     Mouth: Mucous membranes are moist.  Eyes:     Conjunctiva/sclera: Conjunctivae normal.     Pupils: Pupils are equal, round, and reactive to light.  Neck:     Musculoskeletal: Muscular tenderness present.     Comments: Limited rotation towards right.  Positive Spurling's with pressure on head leaning towards right.  Increases right upper arm pain.  Reflexes are 2+ and equal at the bicep tricep and brachioradialis.  Grip strength slightly diminished on right.  Sensory exam is normal Cardiovascular:     Rate and Rhythm: Normal rate.  Pulmonary:     Effort: Pulmonary effort is normal. No respiratory distress.  Abdominal:     General: There is no distension.     Palpations: Abdomen is soft.  Musculoskeletal: Normal range of motion.  Skin:    General: Skin is warm and dry.  Neurological:     General: No focal deficit present.     Mental Status: She is alert. Mental status is at baseline.     Motor: Weakness present.  Psychiatric:        Mood and Affect: Mood normal.         Thought Content: Thought content normal.      UC Treatments / Results  Labs (all labs ordered are listed, but only abnormal results are displayed) Labs Reviewed - No data to display  EKG None  Radiology No results found.  Procedures Procedures (including critical care time)  Medications Ordered in UC Medications - No data to display  Initial Impression / Assessment and Plan / UC Course  I have reviewed the triage vital signs and the nursing notes.  Pertinent labs & imaging results that were available during my care of the patient were reviewed by me and considered in my medical decision making (see chart for details).    By history and clinical examination this appears to be cervical radiculopathy.  Discussed management.  If she continues to have symptoms longer than 6 weeks in spite of treatment she may need additional imaging.  I told her to keep it in the next week with some changes in medication, then see her PCP to consider physical therapy. Final Clinical Impressions(s) / UC Diagnoses   Final diagnoses:  Cervical disc disorder with radiculopathy of cervical region     Discharge Instructions     Take the medrol pak as instructed Take all of day one today Take the muscle relaxer as needed Take pain medicine if pain severe- do not take at same time as clonazepam or ambien for fear of sedation Continue warmth and gentle stretching See  your PCP if fails to improve   ED Prescriptions    Medication Sig Dispense Auth. Provider   methylPREDNISolone (MEDROL DOSEPAK) 4 MG TBPK tablet tad 21 tablet Raylene Everts, MD   tiZANidine (ZANAFLEX) 4 MG tablet Take 1-2 tablets (4-8 mg total) by mouth every 6 (six) hours as needed for muscle spasms. 21 tablet Raylene Everts, MD   HYDROcodone-acetaminophen (NORCO/VICODIN) 5-325 MG tablet Take 1 tablet by mouth every 4 (four) hours as needed. 15 tablet Raylene Everts, MD     Controlled Substance Prescriptions [place]  Controlled Substance Registry consulted? Not Applicable   Raylene Everts, MD 09/15/18 [phone]

## 2018-09-15 NOTE — ED Triage Notes (Signed)
Pt states she started having R neck pain that has traveled down her R arm and its in the back of her shoulder blade. States shes been doing stretches and shes been able to turn her head better with the stretches but she can still feel it pulling. Pain x2 weeks.

## 2018-09-15 NOTE — Discharge Instructions (Signed)
Take the medrol pak as instructed Take all of day one today Take the muscle relaxer as needed Take pain medicine if pain severe- do not take at same time as clonazepam or ambien for fear of sedation Continue warmth and gentle stretching See your PCP if fails to improve

## 2018-09-16 ENCOUNTER — Other Ambulatory Visit: Payer: Self-pay | Admitting: Family Medicine

## 2018-09-19 NOTE — Progress Notes (Addendum)
[place] at [facility] [address], [place], [place] [address] [phone]  Date:  09/20/2018   Name:  Theresa [name]   DOB:  [birth year]   MRN:  [id number]  PCP:  Darreld Mclean, MD    Chief Complaint: Neck Pain (given hydrocodone and steroid at urgent care on Friday, no injury-neck spurs found years ago, neck improved arm is not better)   History of Present Illness:  Theresa [name] is a 63 y.o. very pleasant female patient who presents with the following:  This patient has a history of hyperlipidemia, bipolar disorder, and epilepsy.  She is here today with concern about her neck- She was seen at urgent care on January 24, with complaint of pain from her neck running into her right shoulder and arm.  She noted numbness and tingling in her right fingers No injury Her provider thought she had cervical radiculopathy, and treated her with a Medrol dose pack and as needed muscle relaxer She finished the steroids this am She also finished the zanaflex; this did seem to help a little bit but did not completely relieve her pain Her neck is better, but she continues to have pain into her her arm and hand   Fortunately she did not have any symptoms of mania while on steroids, she monitored herself with these carefully  Her psychiatric care provider is Ms. [name], she keeps up with her visits faithfully.  At her last visit in December, her mood symptoms were stable.  They did decrease her Zyprexa dose at that time She was also seen in October by neurology, to discuss her seizure disorder Her most recent seizure was in December 2018, they continued her medications  Immunizations are up-to-date, but can suggest Shingrix when she is finished with steroid  Here today with her husband who contributes to the history They raise a special breed of dog, have 13 at home Patient Active Problem List   Diagnosis Date Noted  . Manic  bipolar I disorder in partial remission ([place]) 07/25/2018  . Posttraumatic stress disorder 07/25/2018  . Generalized idiopathic epilepsy and epileptic syndromes, not intractable, without status epilepticus ([place]) 06/24/2017  . Special screening for malignant neoplasms, colon 06/16/2012  . Personal history of colonic polyps 06/16/2012  . Bipolar disorder ([place]) 04/02/2012  . Idiopathic generalized epilepsy ([place]) 10/15/2011    Past Medical History:  Diagnosis Date  . Adenomatous colon polyp   . Allergy   . Anxiety   . Bipolar 1 disorder ([place])   . Blood transfusion without reported diagnosis   . Depression   . Elevated LFTs   . Hepatic steatosis 05/07/13  . Hyperlipidemia   . Seizures ([place])   . Tardive dyskinesia     Past Surgical History:  Procedure Laterality Date  . APPENDECTOMY    . COLONOSCOPY  06/16/2012   Procedure: COLONOSCOPY;  Surgeon: Lafayette Dragon, MD;  Location: [facility];  Service: Endoscopy;  Laterality: N/A;  . DILATION AND CURETTAGE OF UTERUS    . KNEE ARTHROSCOPY WITH MENISCAL REPAIR Left     Social History   Tobacco Use  . Smoking status: Never Smoker  . Smokeless tobacco: Never Used  Substance Use Topics  . Alcohol use: Yes    Comment: occasionaly 1 a month  . Drug use: No    Family History  Problem Relation Age of Onset  . Colon polyps Mother   . Cancer Mother   .  Heart disease Mother   . Alcoholism Mother   . Cancer Father   . Alcoholism Father   . Bipolar disorder Father   . Drug abuse Brother   . Bipolar disorder Other   . Colon cancer Neg Hx   . Stomach cancer Neg Hx   . Breast cancer Neg Hx     Allergies  Allergen Reactions  . Lamictal [Lamotrigine]     Acute renal failure  . Codeine Swelling and Rash    Can take hydrocodone  . Penicillins     Abdominal pain    Medication list has been reviewed and updated.  Current Outpatient Medications on File Prior to Visit  Medication Sig Dispense Refill  . clonazePAM (KLONOPIN) 1 MG  tablet Take 1 tablet at bedtime, may also have 1 tablet daily as needed for anxiety. 60 tablet 1  . INGREZZA 80 MG CAPS Take 80 mg by mouth daily.    Marland Kitchen levETIRAcetam (KEPPRA) 500 MG tablet Take 500 mg by mouth 2 (two) times daily.    Marland Kitchen lovastatin (MEVACOR) 20 MG tablet TAKE 1 TABLET BY MOUTH EVERY DAY 30 tablet 0  . Melatonin 10 MG TABS Take 1 tablet by mouth as needed. Reported on 01/30/2016     . Multiple Vitamin (MULTIVITAMIN) tablet Take 1 tablet by mouth daily.    . Omeprazole (PRILOSEC PO) Take 20 mg by mouth daily.     . propranolol (INDERAL) 10 MG tablet Take 1-2 tabs po BID prn anxiety 120 tablet 1  . rOPINIRole (REQUIP) 3 MG tablet Take 1 tablet (3 mg total) by mouth at bedtime. 30 tablet 2  . tiZANidine (ZANAFLEX) 4 MG tablet Take 1-2 tablets (4-8 mg total) by mouth every 6 (six) hours as needed for muscle spasms. 21 tablet 0  . traZODone (DESYREL) 100 MG tablet 1-2 tablets at bedtime as needed for sleep 60 tablet 1  . zolpidem (AMBIEN) 10 MG tablet TAKE 1 TABLET BY MOUTH AT BEDTIME AS NEEDED 30 tablet 2  . Carbamazepine (EQUETRO) 300 MG CP12 Take 2 capsules (600 mg total) by mouth at bedtime. 60 each 5  . OLANZapine (ZYPREXA) 10 MG tablet Take 1 tablet (10 mg total) by mouth at bedtime. 30 tablet 1  . SUMAtriptan (IMITREX) 100 MG tablet take 1 tablet as needed for headache, may repeat in 2 hours. 10 tablet 2   No current facility-administered medications on file prior to visit.     Review of Systems:  As per HPI- otherwise negative.   Physical Examination: Vitals:   09/20/18 1449  BP: 120/80  Pulse: 80  Resp: 16  Temp: 98.6 F (37 C)  SpO2: 98%   Vitals:   09/20/18 1449  Weight: 164 lb (74.4 kg)  Height: 5\' 6"  (1.676 m)   Body mass index is 26.47 kg/m. Ideal Body Weight: Weight in (lb) to have BMI = 25: 154.6  GEN: WDWN, NAD, Non-toxic, A & O x 3, mild overweight, looks well HEENT: Atraumatic, Normocephalic. Neck supple. No masses, No LAD. Ears and Nose: No  external deformity. CV: RRR, No M/G/R. No JVD. No thrill. No extra heart sounds. PULM: CTA B, no wheezes, crackles, rhonchi. No retractions. No resp. distress. No accessory muscle use. EXTR: No c/c/e NEURO Normal gait.  PSYCH: Normally interactive. Conversant. Not depressed or anxious appearing.  Calm demeanor.  No bony tenderness of the neck, but her cervical range of motion is about 75% of expected.  Not meningismus, seems to be chronic. Her grip,  biceps, and triceps strength is 4/5 on the right, and normal on the left Sensation is symmetrical Deep tendon reflexes are symmetrical in both arms  Assessment and Plan: Right arm pain - Plan: DG Cervical Spine Complete, HYDROcodone-acetaminophen (NORCO/VICODIN) 5-325 MG tablet  Right arm weakness  Here today with pain and weakness of her right arm, which we suspect is due to a pinched nerve coming from her neck.  She already has some loss of strength in her arm, and we need to limit steroids given her history of mania.  Plan to obtain plain films today, and then most likely will proceed to an MRI  I refilled her hydrocodone, she has been using this for the last week and it does help some.  Cautioned that this can cause sedation, and should not be combined with other sedating medications  Meds ordered this encounter  Medications  . HYDROcodone-acetaminophen (NORCO/VICODIN) 5-325 MG tablet    Sig: Take 1 tablet by mouth every 4 (four) hours as needed.    Dispense:  20 tablet    Refill:  0     Signed Anayi Bricco, MD   09/15/2018  1   09/15/2018  Hydrocodone-Acetamin 5-325 MG  15.00 3 Yv Nel   [id number]   [place] ([id number])   0  25.00 MME  Medicare   [place]  09/12/2018  1   08/14/2018  Zolpidem Tartrate 10 MG Tablet  30.00 30 Je Car   [id number]   [place] ([id number])   1  0.50 LME  Medicare   [place]  09/01/2018  1   09/01/2018  Clonazepam 1 MG Tablet  60.00 30 Je Car   [id number]   [place] ([id number])   0  4.00 LME  Medicare   [place]  08/14/2018  1   08/14/2018  Zolpidem  Tartrate 10 MG Tablet  30.00 30 Je Car   [id number]   [place] ([id number])   0  0.50 LME  Medicare   [place]  08/03/2018  1   06/29/2018  Clonazepam 1 MG Tablet  45.00 23 Je Car   [id number]   [place] ([id number])   1  3.91 LME  Medicare   [place]  07/16/2018  1   06/09/2018  Zolpidem Tartrate 10 MG Tablet  30.00 30 Je Car   [id number]   [place] (8321)   1  0.50 LME  Medicare   [place]  06/30/2018  1   06/29/2018  Clonazepam 1 MG Tablet  45.00 23 Je Car   [id number]   [place] ([id number])   0  3.91 LME  Medicare   [place] [address]  06/28/2018  1   04/25/2018  Clonazepam 0.5 MG Tablet  45.00 30 Je Car   [id number]   [place] (8321)   1  1.50 LME     Addendum 1/30 Received her neck films as below  Dg Cervical Spine Complete  Result Date: 09/21/2018 CLINICAL DATA:  63 year old female with 2 weeks of Cervical spine pain radiating to the right upper extremity with numbness and tingling. No known injury. EXAM: CERVICAL SPINE - COMPLETE 4+ VIEW COMPARISON:  Cervical spine radiographs 03/25/2014. FINDINGS: Preserved cervical lordosis and normal prevertebral soft tissues contour. Normal cervical AP alignment. Bilateral posterior element alignment is within normal limits. Cervicothoracic junction alignment is within normal limits. Normal C1-C2 alignment and joint spaces. Moderate to severe disc space loss with endplate spurring at M1-D6, chronic but progressed since 2015. Other disc spaces are preserved. Negative visible upper chest. IMPRESSION: 1.  No acute osseous abnormality identified  in the cervical spine. 2. Progressed chronic disc and endplate degeneration at C6-C7. Electronically Signed   By: Genevie Ann M.D.   On: 09/21/2018 09:12    Message to pt, will set her up for an MRI

## 2018-09-20 ENCOUNTER — Encounter: Payer: Self-pay | Admitting: Family Medicine

## 2018-09-20 ENCOUNTER — Ambulatory Visit (HOSPITAL_BASED_OUTPATIENT_CLINIC_OR_DEPARTMENT_OTHER)
Admission: RE | Admit: 2018-09-20 | Discharge: 2018-09-20 | Disposition: A | Discharge status: Home / Self Care | Payer: Medicare Other | Source: Ambulatory Visit | Attending: Family Medicine | Admitting: Family Medicine

## 2018-09-20 ENCOUNTER — Ambulatory Visit (INDEPENDENT_AMBULATORY_CARE_PROVIDER_SITE_OTHER): Payer: Medicare Other | Admitting: Family Medicine

## 2018-09-20 VITALS — BP 120/80 | HR 80 | Temp 98.6°F | Resp 16 | Ht 66.0 in | Wt 164.0 lb

## 2018-09-20 DIAGNOSIS — R29898 Other symptoms and signs involving the musculoskeletal system: Secondary | ICD-10-CM

## 2018-09-20 DIAGNOSIS — M5030 Other cervical disc degeneration, unspecified cervical region: Secondary | ICD-10-CM | POA: Diagnosis not present

## 2018-09-20 DIAGNOSIS — M79601 Pain in right arm: Secondary | ICD-10-CM | POA: Insufficient documentation

## 2018-09-20 DIAGNOSIS — M542 Cervicalgia: Secondary | ICD-10-CM | POA: Diagnosis not present

## 2018-09-20 MED ORDER — HYDROCODONE-ACETAMINOPHEN 5-325 MG PO TABS: 1.0000 | ORAL_TABLET | ORAL | 0 refills | Status: DC | PRN

## 2018-09-20 NOTE — Patient Instructions (Addendum)
It was nice to see you today, but I am sorry having this problem with your neck and arm.  Please go to the ground floor, radiology is right next to the elevators.  They will do x-rays of your neck, then you can go home.  I will be in touch this report, but anticipate will be doing an MRI of your neck as well  Continue to use the pain medication as needed, but remember this can make you drowsy.  It should not be combined with Ambien, or clonazepam generally.  If any changes or worsening, please let me know right away

## 2018-09-21 ENCOUNTER — Encounter: Payer: Self-pay | Admitting: Family Medicine

## 2018-09-21 DIAGNOSIS — M79601 Pain in right arm: Secondary | ICD-10-CM

## 2018-09-21 NOTE — Addendum Note (Signed)
Addended by: Lamar Blinks C on: 09/21/2018 09:22 AM   Modules accepted: Orders

## 2018-09-24 ENCOUNTER — Encounter: Payer: Self-pay | Admitting: Family Medicine

## 2018-09-25 ENCOUNTER — Ambulatory Visit (INDEPENDENT_AMBULATORY_CARE_PROVIDER_SITE_OTHER): Payer: Medicare Other | Admitting: Psychology

## 2018-09-25 DIAGNOSIS — F3181 Bipolar II disorder: Secondary | ICD-10-CM | POA: Diagnosis not present

## 2018-09-28 MED ORDER — HYDROCODONE-ACETAMINOPHEN 5-325 MG PO TABS: 1.0000 | ORAL_TABLET | ORAL | 0 refills | Status: DC | PRN

## 2018-09-30 ENCOUNTER — Other Ambulatory Visit: Payer: Self-pay | Admitting: Psychiatry

## 2018-09-30 ENCOUNTER — Ambulatory Visit
Admission: RE | Admit: 2018-09-30 | Discharge: 2018-09-30 | Disposition: A | Discharge status: Home / Self Care | Payer: Medicare Other | Source: Ambulatory Visit | Attending: Family Medicine | Admitting: Family Medicine

## 2018-09-30 DIAGNOSIS — M5030 Other cervical disc degeneration, unspecified cervical region: Secondary | ICD-10-CM

## 2018-09-30 DIAGNOSIS — M4802 Spinal stenosis, cervical region: Secondary | ICD-10-CM | POA: Diagnosis not present

## 2018-10-02 ENCOUNTER — Encounter: Payer: Self-pay | Admitting: Family Medicine

## 2018-10-02 ENCOUNTER — Other Ambulatory Visit: Payer: Self-pay | Admitting: Family Medicine

## 2018-10-02 DIAGNOSIS — M79601 Pain in right arm: Secondary | ICD-10-CM

## 2018-10-02 MED ORDER — HYDROCODONE-ACETAMINOPHEN 7.5-300 MG PO TABS: ORAL_TABLET | ORAL | 0 refills | Status: DC

## 2018-10-02 NOTE — Telephone Encounter (Signed)
Called pt and went over her MRI She does have a pinched nerve in her neck which would explain her sx Called and was able to get her a NSG appt tomorrow which is much appreciated  Her pain is keeping her awake at night Will increase her rx to hydrocodone 7.5 mg  09/28/2018  1   09/28/2018  Hydrocodone-Acetamin 5-325 MG  20.00 3 Je Cop   [id number]   [place] ([id number])   0  33.33 MME  Medicare   [place]  09/20/2018  1   09/20/2018  Hydrocodone-Acetamin 5-325 MG  20.00 4 Je Cop   [id number]   [place] (8321)   0  25.00 MME  Medicare   [place]  09/15/2018  1   09/15/2018  Hydrocodone-Acetamin 5-325 MG  15.00 3 Yv Nel   [id number]   [place] (8321)   0  25.00 MME  Medicare   [place]  09/12/2018  1   08/14/2018  Zolpidem Tartrate 10 MG Tablet  30.00 30 Je Car   [id number]   [place] (8321)   1  0.50 LME  Medicare   [place]  09/01/2018  1   09/01/2018  Clonazepam 1 MG Tablet  60.00 30 Je Car   [id number]   [place] (8321)   0  4.00 LME  Medicare   [place]  08/14/2018  1   08/14/2018  Zolpidem Tartrate 10 MG Tablet  30.00 30 Je Car   [id number]   [place] (8321)   0  0.50 LME  Medicare   [place]  08/03/2018  1   06/29/2018  Clonazepam 1 MG Tablet  45.00 23 Je Car   [id number]   [place] (8321)   1  3.91 LME  Medicare   [place]  07/16/2018  1   06/09/2018  Zolpidem Tartrate 10 MG Tablet  30.00 30 Je Car   [id number]   [place] (8321)   1  0.50 LME  Medicare   [place]  06/30/2018  1   06/29/2018  Clonazepam 1 MG Tablet  45.00 23 Je Car   [id number]   [place] (8321)   0  3.91 LME  Medicare   [place]

## 2018-10-03 DIAGNOSIS — M5412 Radiculopathy, cervical region: Secondary | ICD-10-CM | POA: Diagnosis not present

## 2018-10-04 ENCOUNTER — Telehealth: Payer: Self-pay | Admitting: Psychiatry

## 2018-10-04 DIAGNOSIS — F4310 Post-traumatic stress disorder, unspecified: Secondary | ICD-10-CM

## 2018-10-04 DIAGNOSIS — F3173 Bipolar disorder, in partial remission, most recent episode manic: Secondary | ICD-10-CM

## 2018-10-04 MED ORDER — CLONAZEPAM 0.5 MG PO TABS: ORAL_TABLET | ORAL | 2 refills | Status: DC

## 2018-10-04 NOTE — Telephone Encounter (Signed)
Pt called requested refill on Clonazepam 0.5 mg at [facility] [address]

## 2018-10-04 NOTE — Telephone Encounter (Signed)
Script sent  

## 2018-10-05 DIAGNOSIS — M4802 Spinal stenosis, cervical region: Secondary | ICD-10-CM | POA: Diagnosis not present

## 2018-10-07 ENCOUNTER — Other Ambulatory Visit: Payer: Self-pay | Admitting: Psychiatry

## 2018-10-07 DIAGNOSIS — F5101 Primary insomnia: Secondary | ICD-10-CM

## 2018-10-07 DIAGNOSIS — F3173 Bipolar disorder, in partial remission, most recent episode manic: Secondary | ICD-10-CM

## 2018-10-07 DIAGNOSIS — F4310 Post-traumatic stress disorder, unspecified: Secondary | ICD-10-CM

## 2018-10-09 ENCOUNTER — Ambulatory Visit: Payer: Medicare Other | Admitting: Psychiatry

## 2018-10-09 ENCOUNTER — Encounter: Payer: Self-pay | Admitting: Psychiatry

## 2018-10-09 VITALS — BP 110/78 | HR 78

## 2018-10-09 DIAGNOSIS — G2581 Restless legs syndrome: Secondary | ICD-10-CM

## 2018-10-09 DIAGNOSIS — F3170 Bipolar disorder, currently in remission, most recent episode unspecified: Secondary | ICD-10-CM

## 2018-10-09 DIAGNOSIS — F5101 Primary insomnia: Secondary | ICD-10-CM | POA: Diagnosis not present

## 2018-10-09 DIAGNOSIS — F3173 Bipolar disorder, in partial remission, most recent episode manic: Secondary | ICD-10-CM

## 2018-10-09 DIAGNOSIS — F4310 Post-traumatic stress disorder, unspecified: Secondary | ICD-10-CM | POA: Diagnosis not present

## 2018-10-09 MED ORDER — CARBAMAZEPINE ER 300 MG PO CP12: 600.0000 mg | ORAL_CAPSULE | Freq: Every day | ORAL | 2 refills | Status: DC

## 2018-10-09 MED ORDER — ROPINIROLE HCL 3 MG PO TABS: 3.0000 mg | ORAL_TABLET | Freq: Every day | ORAL | 2 refills | Status: DC

## 2018-10-09 MED ORDER — CARBAMAZEPINE ER 300 MG PO CP12: 2.0000 | ORAL_CAPSULE | Freq: Every day | ORAL | 5 refills | Status: DC

## 2018-10-09 MED ORDER — OLANZAPINE 10 MG PO TABS: 10.0000 mg | ORAL_TABLET | Freq: Every day | ORAL | 3 refills | Status: DC

## 2018-10-09 MED ORDER — TRAZODONE HCL 100 MG PO TABS: ORAL_TABLET | ORAL | 2 refills | Status: DC

## 2018-10-09 MED ORDER — ZOLPIDEM TARTRATE 10 MG PO TABS: 10.0000 mg | ORAL_TABLET | Freq: Every evening | ORAL | 5 refills | Status: DC | PRN

## 2018-10-09 MED ORDER — PROPRANOLOL HCL 10 MG PO TABS: ORAL_TABLET | ORAL | 3 refills | Status: DC

## 2018-10-09 MED ORDER — CLONAZEPAM 1 MG PO TABS: ORAL_TABLET | ORAL | 2 refills | Status: DC

## 2018-10-09 NOTE — Progress Notes (Signed)
Theresa [name] [id number] October 07, 1955 63 y.o.  Subjective:   Patient ID:  Theresa [name] is a 63 y.o. (DOB Dec 26, 1955) female.  Chief Complaint:  Chief Complaint  Patient presents with  . Follow-up    Mood, anxiety, and insomnia    HPI Sontee H Hettinger presents to the office today for follow-up of mood, anxiety, and insomnia. She reports that she did not notice any significant changes with decrease in dose of Olanzapine.   She reports that her mood and anxiety has been "good" and that she has been staying in the bed most of the last month due to acute pain. She reports that she has had disrupted sleep due to pain. She reports adequate sleep overall. Difficult to gauge energy and motivation due to staying in bed. Notices some decrease in appetite. Concentration has been "ok." Denies any recent manic s/s. Denies SI.   Reports that she has surgery scheduled for Wednesday.   Past Psychiatric Medication Trials: Olanzapine Seroquel Saphris Depakote-self-injurious behavior Lamictal Keppra Carbamazepine Xanax Klonopin Ambien Trazodone Benztropine Ingrezza  Review of Systems:  Review of Systems  Musculoskeletal: Positive for neck pain. Negative for gait problem.       Having cervical surgery on Wednesday.  They report that she started having severe pain about a month ago.   Neurological: Positive for tremors.       She reports tremor in both hands.   Psychiatric/Behavioral:       Please refer to HPI    Medications: I have reviewed the patient's current medications.  Current Outpatient Medications  Medication Sig Dispense Refill  . clonazePAM (KLONOPIN) 0.5 MG tablet Take 1 tab po q am and q noon prn anxiety 60 tablet 2  . [START ON 12/04/2018] clonazePAM (KLONOPIN) 1 MG tablet Take 1 tablet at bedtime, may also have 1 tablet daily as needed for anxiety. 60 tablet 2  . HYDROcodone-Acetaminophen 7.5-300 MG TABS Take 1 every 4-6 hours as needed for pain 25 each 0  . INGREZZA 80  MG CAPS Take 80 mg by mouth daily.    Marland Kitchen levETIRAcetam (KEPPRA) 500 MG tablet Take 500 mg by mouth 2 (two) times daily.    Marland Kitchen lovastatin (MEVACOR) 20 MG tablet TAKE 1 TABLET BY MOUTH EVERY DAY 30 tablet 0  . Melatonin 10 MG TABS Take 1 tablet by mouth as needed. Reported on 01/30/2016     . Omeprazole (PRILOSEC PO) Take 20 mg by mouth daily.     . propranolol (INDERAL) 10 MG tablet Take 1-2 tabs po BID prn anxiety 120 tablet 3  . rOPINIRole (REQUIP) 3 MG tablet Take 1 tablet (3 mg total) by mouth at bedtime. 30 tablet 2  . traZODone (DESYREL) 100 MG tablet 1-2 tablets at bedtime as needed for sleep 60 tablet 2  . zolpidem (AMBIEN) 10 MG tablet Take 1 tablet (10 mg total) by mouth at bedtime as needed for up to 30 days. 30 tablet 5  . carbamazepine (CARBATROL) 300 MG 12 hr capsule Take 2 capsules (600 mg total) by mouth at bedtime for 30 days. 60 capsule 2  . Carbamazepine (EQUETRO) 300 MG CP12 Take 2 capsules (600 mg total) by mouth at bedtime. 60 each 5  . Multiple Vitamin (MULTIVITAMIN) tablet Take 1 tablet by mouth daily.    Marland Kitchen OLANZapine (ZYPREXA) 10 MG tablet Take 1 tablet (10 mg total) by mouth at bedtime for 30 days. 30 tablet 3  . SUMAtriptan (IMITREX) 100 MG tablet take 1 tablet as  needed for headache, may repeat in 2 hours. 10 tablet 2  . tiZANidine (ZANAFLEX) 4 MG tablet Take 1-2 tablets (4-8 mg total) by mouth every 6 (six) hours as needed for muscle spasms. (Patient not taking: Reported on 10/09/2018) 21 tablet 0   No current facility-administered medications for this visit.     Medication Side Effects: None  Allergies:  Allergies  Allergen Reactions  . Lamictal [Lamotrigine]     Acute renal failure  . Codeine Swelling and Rash    Can take hydrocodone  . Penicillins     Abdominal pain    Past Medical History:  Diagnosis Date  . Adenomatous colon polyp   . Allergy   . Anxiety   . Bipolar 1 disorder ([place])   . Blood transfusion without reported diagnosis   . Depression   .  Elevated LFTs   . Hepatic steatosis 05/07/13  . Hyperlipidemia   . Seizures ([place])   . Tardive dyskinesia     Family History  Problem Relation Age of Onset  . Colon polyps Mother   . Cancer Mother   . Heart disease Mother   . Alcoholism Mother   . Cancer Father   . Alcoholism Father   . Bipolar disorder Father   . Drug abuse Brother   . Bipolar disorder Other   . Colon cancer Neg Hx   . Stomach cancer Neg Hx   . Breast cancer Neg Hx     Social History   Socioeconomic History  . Marital status: Married    Spouse name: Not on file  . Number of children: Not on file  . Years of education: Not on file  . Highest education level: Not on file  Occupational History  . Not on file  Social Needs  . Financial resource strain: Not on file  . Food insecurity:    Worry: Not on file    Inability: Not on file  . Transportation needs:    Medical: Not on file    Non-medical: Not on file  Tobacco Use  . Smoking status: Never Smoker  . Smokeless tobacco: Never Used  Substance and Sexual Activity  . Alcohol use: Yes    Comment: occasionaly 1 a month  . Drug use: No  . Sexual activity: Not Currently  Lifestyle  . Physical activity:    Days per week: Not on file    Minutes per session: Not on file  . Stress: Not on file  Relationships  . Social connections:    Talks on phone: Not on file    Gets together: Not on file    Attends religious service: Not on file    Active member of club or organization: Not on file    Attends meetings of clubs or organizations: Not on file    Relationship status: Not on file  . Intimate partner violence:    Fear of current or ex partner: Not on file    Emotionally abused: Not on file    Physically abused: Not on file    Forced sexual activity: Not on file  Other Topics Concern  . 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   Please see review of systems  for further details on the patient's review from today.   Objective:   Physical Exam:  BP 110/78   Pulse 78   Physical Exam Constitutional:      General: She is not in acute distress.    Appearance: She is well-developed.  Musculoskeletal:        General: No deformity.  Neurological:     Mental Status: She is alert and oriented to person, place, and time.     Coordination: Coordination normal.  Psychiatric:        Attention and Perception: Perception normal. She does not perceive auditory or visual hallucinations.        Mood and Affect: Mood normal. Mood is not anxious or depressed. Affect is not labile, blunt, angry or inappropriate.        Speech: Speech normal.        Behavior: Behavior normal.        Thought Content: Thought content normal. Thought content does not include homicidal or suicidal ideation. Thought content does not include homicidal or suicidal plan.        Cognition and Memory: She exhibits impaired recent memory.        Judgment: Judgment normal.     Comments: Insight intact. No delusions.  Pt relies on written notes and husband's input with recalling recent s/s.      Lab Review:     Component Value Date/Time   NA 139 06/13/2017 1515   K 3.9 06/13/2017 1515   CL 101 06/13/2017 1515   CO2 29 06/13/2017 1515   GLUCOSE 92 06/13/2017 1515   BUN 12 06/13/2017 1515   CREATININE 0.78 06/13/2017 1515   CREATININE 0.91 09/19/2015 0959   CALCIUM 9.2 06/13/2017 1515   PROT 6.9 06/13/2017 1515   ALBUMIN 4.3 06/13/2017 1515   AST 22 06/13/2017 1515   ALT 26 06/13/2017 1515   ALKPHOS 116 06/13/2017 1515   BILITOT 0.4 06/13/2017 1515       Component Value Date/Time   WBC 6.4 06/13/2017 1515   RBC 4.99 06/13/2017 1515   HGB 14.7 06/13/2017 1515   HCT 45.0 06/13/2017 1515   PLT 230.0 06/13/2017 1515    MCV 90.2 06/13/2017 1515   MCV 87.7 02/07/2013 1110   MCH 28.3 03/10/2015 0957   MCHC 32.8 06/13/2017 1515   RDW 13.9 06/13/2017 1515    No results found for: POCLITH, LITHIUM   Lab Results  Component Value Date   CBMZ 13.2 (H) 02/28/2017     .res Assessment: Plan:   Continue Carbamazepine ER 600 mg po QHS for mood stabilization. Continue Olanzapine 10 mg po QHS for mood,insomnia, and anxiety. Continue Klonopin 0.5 mg po q noon and 1 mg po QHS and prn anxiety. Continue Ingrezza for TD. Continue Trazodone and Ambien for insomnia.   Bipolar disorder in partial remission, most recent episode unspecified type ([place]) - Plan: Carbamazepine (EQUETRO) 300 MG CP12, carbamazepine (CARBATROL) 300 MG 12 hr capsule  Posttraumatic stress disorder - Plan: clonazePAM (KLONOPIN) 1 MG tablet, OLANZapine (ZYPREXA) 10 MG tablet, propranolol (INDERAL) 10 MG tablet  Primary insomnia - Plan: clonazePAM (KLONOPIN) 1 MG tablet, OLANZapine (ZYPREXA) 10 MG tablet, traZODone (DESYREL) 100 MG tablet, zolpidem (AMBIEN) 10 MG tablet  Manic bipolar I disorder in partial remission ([facility]) - chronic, stable - Plan: OLANZapine (ZYPREXA) 10 MG tablet  Posttraumatic stress disorder - Chronic. Some recent worsening - Plan: clonazePAM (KLONOPIN) 1 MG tablet, OLANZapine (ZYPREXA) 10 MG tablet, propranolol (INDERAL) 10 MG tablet  Primary  insomnia - Chronic. Stable - Plan: clonazePAM (KLONOPIN) 1 MG tablet, OLANZapine (ZYPREXA) 10 MG tablet, traZODone (DESYREL) 100 MG tablet, zolpidem (AMBIEN) 10 MG tablet  Restless leg syndrome - Plan: rOPINIRole (REQUIP) 3 MG tablet  Please see After Visit Summary for patient specific instructions.  Future Appointments  Date Time Provider [place]  10/16/2018 11:00 AM Doree Fudge, PhD [facility] None  11/07/2018 11:00 AM Doree Fudge, PhD [facility] None  01/09/2019 11:00 AM [name], PMHNP [facility] None  01/18/2019 10:00 AM Cameron Sprang, MD [facility] None    No  orders of the defined types were placed in this encounter.     -------------------------------

## 2018-10-09 NOTE — Telephone Encounter (Signed)
Has office visit today

## 2018-10-11 DIAGNOSIS — M50122 Cervical disc disorder at C5-C6 level with radiculopathy: Secondary | ICD-10-CM | POA: Diagnosis not present

## 2018-10-11 DIAGNOSIS — M5412 Radiculopathy, cervical region: Secondary | ICD-10-CM | POA: Diagnosis not present

## 2018-10-11 DIAGNOSIS — M2578 Osteophyte, vertebrae: Secondary | ICD-10-CM | POA: Diagnosis not present

## 2018-10-13 ENCOUNTER — Other Ambulatory Visit: Payer: Self-pay | Admitting: Family Medicine

## 2018-10-15 ENCOUNTER — Other Ambulatory Visit: Payer: Self-pay | Admitting: Psychiatry

## 2018-10-16 ENCOUNTER — Ambulatory Visit (INDEPENDENT_AMBULATORY_CARE_PROVIDER_SITE_OTHER): Payer: Medicare Other | Admitting: Psychology

## 2018-10-16 DIAGNOSIS — F449 Dissociative and conversion disorder, unspecified: Secondary | ICD-10-CM | POA: Diagnosis not present

## 2018-10-28 ENCOUNTER — Other Ambulatory Visit: Payer: Self-pay | Admitting: Neurology

## 2018-11-07 ENCOUNTER — Ambulatory Visit (INDEPENDENT_AMBULATORY_CARE_PROVIDER_SITE_OTHER): Payer: Medicare Other | Admitting: Psychology

## 2018-11-07 DIAGNOSIS — F449 Dissociative and conversion disorder, unspecified: Secondary | ICD-10-CM

## 2018-11-07 DIAGNOSIS — F3111 Bipolar disorder, current episode manic without psychotic features, mild: Secondary | ICD-10-CM

## 2018-11-09 ENCOUNTER — Other Ambulatory Visit: Payer: Self-pay | Admitting: Psychiatry

## 2018-11-09 DIAGNOSIS — F5101 Primary insomnia: Secondary | ICD-10-CM

## 2018-11-14 ENCOUNTER — Other Ambulatory Visit: Payer: Self-pay | Admitting: Psychiatry

## 2018-11-14 DIAGNOSIS — M542 Cervicalgia: Secondary | ICD-10-CM | POA: Diagnosis not present

## 2018-11-14 DIAGNOSIS — F5101 Primary insomnia: Secondary | ICD-10-CM

## 2018-12-05 ENCOUNTER — Ambulatory Visit (INDEPENDENT_AMBULATORY_CARE_PROVIDER_SITE_OTHER): Payer: Medicare Other | Admitting: Psychology

## 2018-12-05 DIAGNOSIS — F3181 Bipolar II disorder: Secondary | ICD-10-CM

## 2018-12-05 DIAGNOSIS — F449 Dissociative and conversion disorder, unspecified: Secondary | ICD-10-CM

## 2018-12-09 ENCOUNTER — Other Ambulatory Visit: Payer: Self-pay | Admitting: Psychiatry

## 2018-12-09 DIAGNOSIS — F4310 Post-traumatic stress disorder, unspecified: Secondary | ICD-10-CM

## 2018-12-09 DIAGNOSIS — F5101 Primary insomnia: Secondary | ICD-10-CM

## 2018-12-11 ENCOUNTER — Other Ambulatory Visit: Payer: Self-pay | Admitting: Obstetrics and Gynecology

## 2018-12-11 DIAGNOSIS — Z1231 Encounter for screening mammogram for malignant neoplasm of breast: Secondary | ICD-10-CM

## 2018-12-12 ENCOUNTER — Ambulatory Visit (INDEPENDENT_AMBULATORY_CARE_PROVIDER_SITE_OTHER): Payer: Medicare Other | Admitting: Psychiatry

## 2018-12-12 ENCOUNTER — Other Ambulatory Visit: Payer: Self-pay | Admitting: Psychiatry

## 2018-12-12 ENCOUNTER — Other Ambulatory Visit: Payer: Self-pay

## 2018-12-12 DIAGNOSIS — F4310 Post-traumatic stress disorder, unspecified: Secondary | ICD-10-CM | POA: Diagnosis not present

## 2018-12-12 DIAGNOSIS — F5101 Primary insomnia: Secondary | ICD-10-CM | POA: Diagnosis not present

## 2018-12-12 DIAGNOSIS — G2581 Restless legs syndrome: Secondary | ICD-10-CM

## 2018-12-12 DIAGNOSIS — G2401 Drug induced subacute dyskinesia: Secondary | ICD-10-CM

## 2018-12-12 DIAGNOSIS — F3170 Bipolar disorder, currently in remission, most recent episode unspecified: Secondary | ICD-10-CM

## 2018-12-12 NOTE — Progress Notes (Signed)
Theresa [name] [id number] March 14, 1956 63 y.o.    Virtual Visit via Telephone Note  I connected with Theresa [name] on 12/12/18 at  8:30 AM EDT by telephone and verified that I am speaking with the correct person using two identifiers.   I discussed the limitations, risks, security and privacy concerns of performing an evaluation and management service by telephone and the availability of in person appointments. I also discussed with the patient that there may be a patient responsible charge related to this service. The patient expressed understanding and agreed to proceed.  I discussed the assessment and treatment plan with the patient. The patient was provided an opportunity to ask questions and all were answered. The patient agreed with the plan and demonstrated an understanding of the instructions.   The patient was advised to call back or seek an in-person evaluation if the symptoms worsen or if the condition fails to improve as anticipated.  I provided 30 minutes of non-face-to-face time during this encounter. The pt was located at home. The provider was located at home.    [name], PMHNP    Subjective:   Patient ID:  Theresa [name] is a 63 y.o. (DOB [birth year]) female.  Chief Complaint:  Chief Complaint  Patient presents with  . Medication Reaction    Tardive Dyskinesia  . Follow-up    h/o mood disturbance, anxiety, insomnia    HPI Theresa [name] presents for tele-visit. Had neck surgery in February and was not able to swallow medications for 2 weeks. She reports that her mood remained stable. She reports that when she resumed medication she re-started carbamazepine at 300 mg po QHS and mood remains stable. "I've been feeling good." She denies depressed mood or mania. She reports "anxiety has been fine. I am ok anxiety-wise." Reports mild anxiety in response to pandemic. She reports that she is sleeping well. She reports having about one night a week of disrupted  sleep. She reports that her appetite has been good. She reports that her energy has been gradually improving since her surgery. She reports less daytime somnolence. Motivation has been good. Reports that she is doing at least one project daily. Reports that her concentration has been good. Denies SI.   Denies AH, VH, or paranoia.   Concerned about tremors in her hands. She reports, "my hands and my feet move." She reports that she also experiences tongue movements- "It's just clicking in my mouth but it is staying in my mouth." She reports that she has noticed some worsening in TD in the last month. "It only happens sometimes. It's not constant."    Taking Klonopin 0.5 mg at lunch and 1 mg at bedtime. Reports that she occasionally forgets lunch time dose of Klonopin. She reports taking Trazodone 100 mg po QHS and Ambien at bedtime.   Past Psychiatric Medication Trials: Olanzapine Seroquel Saphris Depakote-self-injurious behavior Lamictal Keppra Carbamazepine Xanax Klonopin Ambien Trazodone Benztropine Ingrezza  Review of Systems:  Review of Systems  Gastrointestinal: Negative.   Musculoskeletal: Positive for neck pain.       Reports improved neck pain since surgery.  Neurological: Positive for tremors.  Psychiatric/Behavioral: Negative for dysphoric mood, self-injury and suicidal ideas. The patient is not nervous/anxious.     Medications: I have reviewed the patient's current medications.  Current Outpatient Medications  Medication Sig Dispense Refill  . clonazePAM (KLONOPIN) 0.5 MG tablet Take 1 tab po q am and q noon prn anxiety 60 tablet 2  . INGREZZA  80 MG CAPS Take 80 mg by mouth daily.    Marland Kitchen levETIRAcetam (KEPPRA) 500 MG tablet TAKE 1 TABLET BY MOUTH TWICE A DAY 60 tablet 11  . lovastatin (MEVACOR) 20 MG tablet TAKE 1 TABLET BY MOUTH EVERY DAY 30 tablet 5  . Melatonin 10 MG TABS Take 1 tablet by mouth as needed. Reported on 01/30/2016     . Multiple Vitamin (MULTIVITAMIN)  tablet Take 1 tablet by mouth daily.    . propranolol (INDERAL) 10 MG tablet Take 1-2 tabs po BID prn anxiety 120 tablet 3  . rOPINIRole (REQUIP) 3 MG tablet Take 1 tablet (3 mg total) by mouth at bedtime. 30 tablet 2  . traZODone (DESYREL) 100 MG tablet 1-2 tablets at bedtime as needed for sleep 60 tablet 2  . carbamazepine (CARBATROL) 300 MG 12 hr capsule Take 2 capsules (600 mg total) by mouth at bedtime for 30 days. (Patient taking differently: Take 300 mg by mouth at bedtime. ) 60 capsule 2  . clonazePAM (KLONOPIN) 1 MG tablet TAKE 1 TABLET BY MOUTH AT BEDTIME MAY HAVE 1 TABLET DAILY AS NEEDED FOR ANXIETY 60 tablet 1  . HYDROcodone-Acetaminophen 7.5-300 MG TABS Take 1 every 4-6 hours as needed for pain (Patient not taking: Reported on 12/12/2018) 25 each 0  . OLANZapine (ZYPREXA) 10 MG tablet Take 1 tablet (10 mg total) by mouth at bedtime for 30 days. 30 tablet 3  . OLANZapine (ZYPREXA) 5 MG tablet Take 1 tablet (5 mg total) by mouth at bedtime for 30 days. 30 tablet 1  . Omeprazole (PRILOSEC PO) Take 20 mg by mouth daily.     . SUMAtriptan (IMITREX) 100 MG tablet take 1 tablet as needed for headache, may repeat in 2 hours. 10 tablet 2  . tiZANidine (ZANAFLEX) 4 MG tablet Take 1-2 tablets (4-8 mg total) by mouth every 6 (six) hours as needed for muscle spasms. (Patient not taking: Reported on 10/09/2018) 21 tablet 0  . zolpidem (AMBIEN) 10 MG tablet Take 1 tablet (10 mg total) by mouth at bedtime as needed for up to 30 days. 30 tablet 5   No current facility-administered medications for this visit.     Medication Side Effects: Other: TD  Allergies:  Allergies  Allergen Reactions  . Lamictal [Lamotrigine]     Acute renal failure  . Codeine Swelling and Rash    Can take hydrocodone  . Penicillins     Abdominal pain    Past Medical History:  Diagnosis Date  . Adenomatous colon polyp   . Allergy   . Anxiety   . Bipolar 1 disorder ([place])   . Blood transfusion without reported  diagnosis   . Depression   . Elevated LFTs   . Hepatic steatosis 05/07/13  . Hyperlipidemia   . Seizures ([place])   . Tardive dyskinesia     Family History  Problem Relation Age of Onset  . Colon polyps Mother   . Cancer Mother   . Heart disease Mother   . Alcoholism Mother   . Cancer Father   . Alcoholism Father   . Bipolar disorder Father   . Drug abuse Brother   . Bipolar disorder Other   . Colon cancer Neg Hx   . Stomach cancer Neg Hx   . Breast cancer Neg Hx     Social History   Socioeconomic History  . Marital status: Married    Spouse name: Not on file  . Number of children: Not on file  .  Years of education: Not on file  . Highest education level: Not on file  Occupational History  . Not on file  Social Needs  . Financial resource strain: Not on file  . Food insecurity:    Worry: Not on file    Inability: Not on file  . Transportation needs:    Medical: Not on file    Non-medical: Not on file  Tobacco Use  . Smoking status: Never Smoker  . Smokeless tobacco: Never Used  Substance and Sexual Activity  . Alcohol use: Yes    Comment: occasionaly 1 a month  . Drug use: No  . Sexual activity: Not Currently  Lifestyle  . Physical activity:    Days per week: Not on file    Minutes per session: Not on file  . Stress: Not on file  Relationships  . Social connections:    Talks on phone: Not on file    Gets together: Not on file    Attends religious service: Not on file    Active member of club or organization: Not on file    Attends meetings of clubs or organizations: Not on file    Relationship status: Not on file  . Intimate partner violence:    Fear of current or ex partner: Not on file    Emotionally abused: Not on file    Physically abused: Not on file    Forced sexual activity: Not on file  Other Topics Concern  . 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   Please see review of systems for further details on the patient's review from today.   Objective:   Physical Exam:  There were no vitals taken for this visit.  Physical Exam Neurological:     Mental Status: She is alert and oriented to person, place, and time.     Cranial Nerves: No dysarthria.  Psychiatric:        Attention and Perception: Attention normal.        Mood and Affect: Mood normal.        Speech: Speech normal.        Behavior: Behavior is cooperative.        Thought Content: Thought content normal. Thought content is not paranoid or delusional. Thought content does not include homicidal or suicidal ideation. Thought content does not include homicidal or suicidal plan.        Cognition and Memory: Cognition and memory normal.        Judgment: Judgment normal.     Lab Review:     Component Value Date/Time   NA 139 06/13/2017 1515   K 3.9 06/13/2017 1515   CL 101 06/13/2017 1515   CO2 29 06/13/2017 1515   GLUCOSE 92 06/13/2017 1515   BUN 12 06/13/2017 1515   CREATININE 0.78 06/13/2017 1515   CREATININE 0.91 09/19/2015 0959   CALCIUM 9.2 06/13/2017 1515   PROT 6.9 06/13/2017 1515   ALBUMIN 4.3 06/13/2017 1515   AST 22 06/13/2017 1515   ALT 26 06/13/2017 1515   ALKPHOS 116 06/13/2017 1515   BILITOT 0.4 06/13/2017 1515       Component Value Date/Time   WBC 6.4 06/13/2017 1515  RBC 4.99 06/13/2017 1515   HGB 14.7 06/13/2017 1515   HCT 45.0 06/13/2017 1515   PLT 230.0 06/13/2017 1515   MCV 90.2 06/13/2017 1515   MCV 87.7 02/07/2013 1110   MCH 28.3 03/10/2015 0957   MCHC 32.8 06/13/2017 1515   RDW 13.9 06/13/2017 1515    No results found for: POCLITH, LITHIUM   Lab Results  Component Value Date   CBMZ 13.2 (H) 02/28/2017     .res Assessment: Plan:   Case staffed with Dr. Clovis Pu.  Discussed  utilizing carbamazepine for mood stabilization more than olanzapine due to tardive dyskinesia.  Discussed that decrease in carbamazepine could have resulted in higher levels of olanzapine and also affected blood levels of Ingrezza.  Will therefore increase carbamazepine extended release to 600 mg p.o. nightly for mood stabilization.  Will decrease olanzapine to 5 mg p.o. QHS for mood stabilization in order to minimize risk of tardive dyskinesia. Continue Klonopin 0.5 mg po q noon and 1 mg po QHS and prn anxiety. Continue Ingrezza for TD. Continue Trazodone and Ambien for insomnia.  Patient advised to contact office with any questions, adverse effects, or acute worsening in signs and symptoms. Follow-up in 1 month or sooner if clinically indicated.  Bipolar disorder in partial remission, most recent episode unspecified type ([place])  Posttraumatic stress disorder  Primary insomnia  Tardive dyskinesia  Restless leg syndrome  Please see After Visit Summary for patient specific instructions.  Future Appointments  Date Time Provider [place]  01/02/2019 12:00 PM Doree Fudge, PhD [facility] None  01/09/2019 11:00 AM [name], PMHNP [facility] None  01/18/2019 10:00 AM Cameron Sprang, MD [facility] None  02/12/2019 10:50 AM [facility] 2 [facility]  02/19/2019  2:00 PM Dennis Bast, RN [facility]    No orders of the defined types were placed in this encounter.     -------------------------------

## 2018-12-15 ENCOUNTER — Telehealth: Payer: Self-pay | Admitting: Psychiatry

## 2018-12-15 DIAGNOSIS — F319 Bipolar disorder, unspecified: Secondary | ICD-10-CM

## 2018-12-15 MED ORDER — OLANZAPINE 5 MG PO TABS: 5.0000 mg | ORAL_TABLET | Freq: Every day | ORAL | 1 refills | Status: DC

## 2018-12-15 NOTE — Telephone Encounter (Signed)
Take Carbamazepine 600 mg QHS Decrease Olanzapine to 5 mg po QHS.

## 2018-12-15 NOTE — Telephone Encounter (Signed)
Patient called and said that when she spoke to you on Tuesday that you would call her back. She is still waiting for you to call. [phone]

## 2018-12-16 ENCOUNTER — Encounter: Payer: Self-pay | Admitting: Psychiatry

## 2018-12-18 ENCOUNTER — Ambulatory Visit: Payer: Self-pay | Admitting: *Deleted

## 2018-12-26 DIAGNOSIS — M542 Cervicalgia: Secondary | ICD-10-CM | POA: Diagnosis not present

## 2018-12-30 ENCOUNTER — Other Ambulatory Visit: Payer: Self-pay | Admitting: Psychiatry

## 2018-12-30 DIAGNOSIS — G2581 Restless legs syndrome: Secondary | ICD-10-CM

## 2019-01-02 ENCOUNTER — Ambulatory Visit (INDEPENDENT_AMBULATORY_CARE_PROVIDER_SITE_OTHER): Payer: Medicare Other | Admitting: Psychology

## 2019-01-02 DIAGNOSIS — F449 Dissociative and conversion disorder, unspecified: Secondary | ICD-10-CM

## 2019-01-09 ENCOUNTER — Ambulatory Visit (INDEPENDENT_AMBULATORY_CARE_PROVIDER_SITE_OTHER): Payer: Medicare Other | Admitting: Psychiatry

## 2019-01-09 ENCOUNTER — Other Ambulatory Visit: Payer: Self-pay

## 2019-01-09 ENCOUNTER — Encounter: Payer: Self-pay | Admitting: Psychiatry

## 2019-01-09 DIAGNOSIS — G2401 Drug induced subacute dyskinesia: Secondary | ICD-10-CM | POA: Diagnosis not present

## 2019-01-09 DIAGNOSIS — F4310 Post-traumatic stress disorder, unspecified: Secondary | ICD-10-CM | POA: Diagnosis not present

## 2019-01-09 DIAGNOSIS — F3170 Bipolar disorder, currently in remission, most recent episode unspecified: Secondary | ICD-10-CM | POA: Diagnosis not present

## 2019-01-09 DIAGNOSIS — F5101 Primary insomnia: Secondary | ICD-10-CM

## 2019-01-09 DIAGNOSIS — G2581 Restless legs syndrome: Secondary | ICD-10-CM | POA: Diagnosis not present

## 2019-01-09 MED ORDER — VALBENAZINE TOSYLATE 40 MG PO CAPS: 40.0000 mg | ORAL_CAPSULE | Freq: Every day | ORAL | 1 refills | Status: DC

## 2019-01-09 MED ORDER — ROPINIROLE HCL ER 2 MG PO TB24: 2.0000 mg | ORAL_TABLET | Freq: Every day | ORAL | 1 refills | Status: DC

## 2019-01-09 NOTE — Progress Notes (Signed)
[name] [id number] 14-Jan-1956 63 y.o.  Virtual Visit via Telephone Note  I connected with pt on 01/09/19 at 11:00 AM EDT by telephone and verified that I am speaking with the correct person using two identifiers.   I discussed the limitations, risks, security and privacy concerns of performing an evaluation and management service by telephone and the availability of in person appointments. I also discussed with the patient that there may be a patient responsible charge related to this service. The patient expressed understanding and agreed to proceed.   I discussed the assessment and treatment plan with the patient. The patient was provided an opportunity to ask questions and all were answered. The patient agreed with the plan and demonstrated an understanding of the instructions.   The patient was advised to call back or seek an in-person evaluation if the symptoms worsen or if the condition fails to improve as anticipated.  I provided 30 minutes of non-face-to-face time during this encounter.  The patient was located at home.  The provider was located at home.   [name], PMHNP   Subjective:   Patient ID:  Theresa [name] is a 63 y.o. (DOB May 20, 1956) female.  Chief Complaint:  Chief Complaint  Patient presents with  . Medication Problem    HPI [name] presents for follow-up of mood and anxiety. She reports that she had worsening TD with decrease in Olanzapine to 5 mg po QHS with increased hand movements. She then stopped Olanzapine on 12/28/18 and reports that TD improved over the next few days and continues to have some TD. She reports that she continues to have tremors in one hand and "have one foot that won't stop moving." She reports that she continues to have some mild orofacial movements. She reports that she has been having severe nausea with increase in Carbamazepine ER. She decreased Carbamazepine ER to 300 mg po QHS and this has helped some with nausea  but continues to have nausea at 300 mg dose. Reports nausea starts around 9:30 pm. Takes Ropinorole at 9 pm and takes other HS meds at 10 pm.   She reports that her mood has been stable. Minimal sad mood at times due to quarantine. Denies manic s/s. "My husband thinks I have been manic a little bit." She reports that she has been awakening around 6:30 am and is "wide awake" and is going to sleep around 11:30. She reports that she is sleeping well. She reports that her energy has been "a little low." Denies excessive energy. Reports that she has not been painting. Has been trying to walk 30 minutes a day. Denies impulsive or risky behavior. She describes her appetite as "so, so." "Concentration has been fine. Memory is still a problem." Denies SI.   She reports that her anxiety has been well controlled and she has not had to use Klonopin prn in the afternoon.   Has been able to have virtual visits with her therapist.  Past Psychiatric Medication Trials: Olanzapine Seroquel Saphris Depakote-self-injurious behavior Lamictal Keppra Carbamazepine Xanax Klonopin Ambien Trazodone Benztropine Ingrezza  Review of Systems:  Review of Systems  Gastrointestinal: Positive for nausea.       Reports feeling nauseous in the evening about the time she takes her medications and does not increase or decrease after taking medication.   Musculoskeletal: Negative for gait problem.  Neurological: Positive for tremors.  Psychiatric/Behavioral:       Please refer to HPI    Medications: I have  reviewed the patient's current medications.  Current Outpatient Medications  Medication Sig Dispense Refill  . clonazePAM (KLONOPIN) 0.5 MG tablet Take 1 tab po q am and q noon prn anxiety 60 tablet 2  . clonazePAM (KLONOPIN) 1 MG tablet TAKE 1 TABLET BY MOUTH AT BEDTIME MAY HAVE 1 TABLET DAILY AS NEEDED FOR ANXIETY 60 tablet 1  . INGREZZA 80 MG CAPS Take 80 mg by mouth daily.    Marland Kitchen levETIRAcetam (KEPPRA) 500 MG  tablet TAKE 1 TABLET BY MOUTH TWICE A DAY 60 tablet 11  . lovastatin (MEVACOR) 20 MG tablet TAKE 1 TABLET BY MOUTH EVERY DAY 30 tablet 5  . Melatonin 10 MG TABS Take 1 tablet by mouth as needed. Reported on 01/30/2016     . Multiple Vitamin (MULTIVITAMIN) tablet Take 1 tablet by mouth daily.    . propranolol (INDERAL) 10 MG tablet Take 1-2 tabs po BID prn anxiety 120 tablet 3  . traZODone (DESYREL) 100 MG tablet 1-2 tablets at bedtime as needed for sleep 60 tablet 2  . carbamazepine (CARBATROL) 300 MG 12 hr capsule Take 2 capsules (600 mg total) by mouth at bedtime for 30 days. (Patient taking differently: Take 300 mg by mouth at bedtime. ) 60 capsule 2  . HYDROcodone-Acetaminophen 7.5-300 MG TABS Take 1 every 4-6 hours as needed for pain (Patient not taking: Reported on 12/12/2018) 25 each 0  . OLANZapine (ZYPREXA) 10 MG tablet Take 1 tablet (10 mg total) by mouth at bedtime for 30 days. 30 tablet 3  . OLANZapine (ZYPREXA) 5 MG tablet Take 1 tablet (5 mg total) by mouth at bedtime for 30 days. (Patient not taking: Reported on 01/09/2019) 30 tablet 1  . Omeprazole (PRILOSEC PO) Take 20 mg by mouth daily.     Marland Kitchen rOPINIRole (REQUIP XL) 2 MG 24 hr tablet Take 1 tablet (2 mg total) by mouth at bedtime for 30 days. 30 tablet 1  . SUMAtriptan (IMITREX) 100 MG tablet take 1 tablet as needed for headache, may repeat in 2 hours. 10 tablet 2  . tiZANidine (ZANAFLEX) 4 MG tablet Take 1-2 tablets (4-8 mg total) by mouth every 6 (six) hours as needed for muscle spasms. (Patient not taking: Reported on 10/09/2018) 21 tablet 0  . zolpidem (AMBIEN) 10 MG tablet Take 1 tablet (10 mg total) by mouth at bedtime as needed for up to 30 days. 30 tablet 5   No current facility-administered medications for this visit.     Medication Side Effects: Nausea  Allergies:  Allergies  Allergen Reactions  . Lamictal [Lamotrigine]     Acute renal failure  . Codeine Swelling and Rash    Can take hydrocodone  . Penicillins      Abdominal pain    Past Medical History:  Diagnosis Date  . Adenomatous colon polyp   . Allergy   . Anxiety   . Bipolar 1 disorder ([place])   . Blood transfusion without reported diagnosis   . Depression   . Elevated LFTs   . Hepatic steatosis 05/07/13  . Hyperlipidemia   . Seizures ([place])   . Tardive dyskinesia     Family History  Problem Relation Age of Onset  . Colon polyps Mother   . Cancer Mother   . Heart disease Mother   . Alcoholism Mother   . Cancer Father   . Alcoholism Father   . Bipolar disorder Father   . Drug abuse Brother   . Bipolar disorder Other   . Colon  cancer Neg Hx   . Stomach cancer Neg Hx   . Breast cancer Neg Hx     Social History   Socioeconomic History  . Marital status: Married    Spouse name: Not on file  . Number of children: Not on file  . Years of education: Not on file  . Highest education level: Not on file  Occupational History  . Not on file  Social Needs  . Financial resource strain: Not on file  . Food insecurity:    Worry: Not on file    Inability: Not on file  . Transportation needs:    Medical: Not on file    Non-medical: Not on file  Tobacco Use  . Smoking status: Never Smoker  . Smokeless tobacco: Never Used  Substance and Sexual Activity  . Alcohol use: Yes    Comment: occasionaly 1 a month  . Drug use: No  . Sexual activity: Not Currently  Lifestyle  . Physical activity:    Days per week: Not on file    Minutes per session: Not on file  . Stress: Not on file  Relationships  . Social connections:    Talks on phone: Not on file    Gets together: Not on file    Attends religious service: Not on file    Active member of club or organization: Not on file    Attends meetings of clubs or organizations: Not on file    Relationship status: Not on file  . Intimate partner violence:    Fear of current or ex partner: Not on file    Emotionally abused: Not on file    Physically abused: Not on file    Forced sexual  activity: Not on file  Other Topics Concern  . 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   Please see review of systems for further details on the patient's review from today.   Objective:   Physical Exam:  There were no vitals taken for this visit.  Physical Exam Neurological:     Mental Status: She is alert and oriented to person, place, and time.     Cranial Nerves: No dysarthria.  Psychiatric:        Attention and Perception: Attention normal.        Mood and Affect: Mood normal.        Speech: Speech normal.        Behavior: Behavior is cooperative.        Thought Content: Thought content normal. Thought content is not paranoid or delusional. Thought content does not include homicidal or suicidal ideation. Thought content does not include homicidal or suicidal plan.        Judgment: Judgment normal.     Lab Review:     Component Value Date/Time   NA 139 06/13/2017 1515   K 3.9 06/13/2017 1515   CL 101 06/13/2017 1515   CO2 29 06/13/2017 1515   GLUCOSE 92 06/13/2017 1515   BUN 12 06/13/2017 1515   CREATININE 0.78 06/13/2017 1515   CREATININE 0.91 09/19/2015 0959   CALCIUM 9.2 06/13/2017 1515   PROT 6.9 06/13/2017 1515   ALBUMIN 4.3 06/13/2017 1515   AST 22 06/13/2017  1515   ALT 26 06/13/2017 1515   ALKPHOS 116 06/13/2017 1515   BILITOT 0.4 06/13/2017 1515       Component Value Date/Time   WBC 6.4 06/13/2017 1515   RBC 4.99 06/13/2017 1515   HGB 14.7 06/13/2017 1515   HCT 45.0 06/13/2017 1515   PLT 230.0 06/13/2017 1515   MCV 90.2 06/13/2017 1515   MCV 87.7 02/07/2013 1110   MCH 28.3 03/10/2015 0957   MCHC 32.8 06/13/2017 1515   RDW 13.9 06/13/2017 1515    No results found for: POCLITH, LITHIUM   Lab Results   Component Value Date   CBMZ 13.2 (H) 02/28/2017     .res Assessment: Plan:   Case staffed with Dr. Clovis Pu. Will change ropinirole to milligrams p.o. nightly to ropinirole XL 2 mg several hours before symptoms typically begin.  Change in ropinirole to XR and reduction in dose may help improve nausea.  Patient advised to contact office if nausea does not improve. Will also consider providing samples of Equetro if nausea does not resolve to determine if carbamazepine may be causing nausea. Will increase Ingrezza to 120 mg at bedtime for tardive dyskinesia since carbamazepine and decrease blood levels of Ingrezza and a higher dose of carbamazepine is needed to adequately control patient's mood signs and symptoms. Will continue carbamazepine extended release 300 mg p.o. nightly for mood stabilization. Continue Klonopin for anxiety. Continue trazodone 100 mg p.o. nightly and Ambien 10 mg p.o. nightly for insomnia. Patient to follow-up in 4 weeks or sooner if clinically indicated. Patient advised to contact office with any questions, adverse effects, or acute worsening in signs and symptoms.  Restless leg syndrome - Plan: rOPINIRole (REQUIP XL) 2 MG 24 hr tablet  Bipolar disorder in partial remission, most recent episode unspecified type ([place])  Posttraumatic stress disorder  Primary insomnia  Tardive dyskinesia  Please see After Visit Summary for patient specific instructions.  Future Appointments  Date Time Provider [place]  01/30/2019 12:00 PM Doree Fudge, PhD [facility] None  02/06/2019 10:00 AM [name], PMHNP [facility] None  02/12/2019 10:50 AM [facility] MM 2 [facility]  02/19/2019  2:00 PM Naaman Plummer A, RN [facility]    No orders of the defined types were placed in this encounter.     -------------------------------

## 2019-01-11 ENCOUNTER — Telehealth: Payer: Self-pay | Admitting: Psychiatry

## 2019-01-11 DIAGNOSIS — G2581 Restless legs syndrome: Secondary | ICD-10-CM

## 2019-01-11 MED ORDER — ROPINIROLE HCL 3 MG PO TABS: 3.0000 mg | ORAL_TABLET | Freq: Every day | ORAL | 1 refills | Status: DC

## 2019-01-11 NOTE — Telephone Encounter (Signed)
Insurance will not cover Ropinorole extended release for RLS. Will therefore send in lower dose of Ropinorole immediate and decrease dose to 3 mg po QHS.

## 2019-01-12 ENCOUNTER — Telehealth: Payer: Medicare Other | Admitting: Neurology

## 2019-01-17 ENCOUNTER — Ambulatory Visit: Payer: Self-pay | Admitting: Neurology

## 2019-01-18 ENCOUNTER — Ambulatory Visit: Payer: Self-pay | Admitting: Neurology

## 2019-01-28 ENCOUNTER — Encounter: Payer: Self-pay | Admitting: Family Medicine

## 2019-01-30 ENCOUNTER — Ambulatory Visit (INDEPENDENT_AMBULATORY_CARE_PROVIDER_SITE_OTHER): Payer: Medicare Other | Admitting: Psychology

## 2019-01-30 DIAGNOSIS — F3181 Bipolar II disorder: Secondary | ICD-10-CM | POA: Diagnosis not present

## 2019-01-30 NOTE — Progress Notes (Addendum)
[facility] [address] [place], [place] [address] [phone]  Date:  02/01/2019   Name:  Theresa [name]   DOB:  [birth year]   MRN:  [id number]  PCP:  Darreld Mclean, MD    Chief Complaint: Fatigue (no energy, night sweats, burning sensation on tongue) and Constipation   History of Present Illness:  Theresa [name] is a 63 y.o. very pleasant female patient who presents with the following:  [name] is seen in person today for follow-up visit.  She is a very nice lady with history of bipolar disorder and epilepsy  She recently sent me the following my chart message, and I asked her to come in for visit: I had my neck surgery and was recovering from all the time spent in bed from it, but now I am not doing so well. I have no energy and little stamina. 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 Had it years ago when going through menopause. Have had it now for a month and having night sweats like menopause again. Recently stopped one of my Bipolar medicines, Olanzapine and restless leg medicine, Ropinerol that was making me nauseous. Have been on a diet and losing weight. I was walking 1/2 an hour a day, but do not have the stamina now. I am walking 15 minutes a day and resting the remainder of the day due to lack of energy. I have a wellness appt with your PA at the end of the month and want to know if I should wait to be seen then or if you would like me to have an appt with you sooner. I also have been having some Tardive Dyskinesia that keeps my feet moving and have some tremors in my hands. Please advise. thank you, Aleatha Borer   Her mental health care provider is [name] She saw her in the office about 3 weeks ago, and they made a few medication adjustments She does have history of tardive dyskinesia  Her neurologist is Dr. Delice Lesch, who she sees for idiopathic generalized epilepsy.  Her most recent seizure was I believe  December 2018.  She is otherwise been well controlled on Keppra and Equetro  She has her mammogram scheduled for later this month She is due for complete labs at this time  She had a C spine surgery in mid February -after spending some time recovering, she began trying to be more active and to exercise more by mid March She worked up to walking about 30 minutes a day, but then started to notice that exercise felt harder as of 2 weeks ago. No CP, no chest pressure, no SOB- just feels more tired  She has just felt tired in general- not sleepy, she feels fatigued   She has noted more sx of her tardive dyskinesia; she has had this for about 2 years now  She stopped taking ropinerol and olanzapine -she was having severe weight gain with the Zyprexa  Hot flashes also seemed to retrn as of 2 weeks ago- will occur at night- up to 4x a night,  She will wake up soaking wet She has not had a hysterectomy monopausal in her late 84s No PMB She also notes a burning feeling in her mouth.  She had this years ago.  It is not as severe now as it was in the past Patient Active Problem List   Diagnosis Date Noted  . 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 Posttraumatic stress disorder 07/25/2018  . Generalized idiopathic epilepsy and epileptic syndromes, not intractable, without status epilepticus ([place]) 06/24/2017  . Special screening for malignant neoplasms, colon 06/16/2012  . Personal history of colonic polyps 06/16/2012  . Bipolar disorder ([place]) 04/02/2012  . Idiopathic generalized epilepsy ([place]) 10/15/2011    Past Medical History:  Diagnosis Date  . Adenomatous colon polyp   . Allergy   . Anxiety   . Bipolar 1 disorder ([place])   . Blood transfusion without reported diagnosis   . Depression   . Elevated LFTs   . Hepatic steatosis 05/07/13  . Hyperlipidemia   . Seizures ([place])   . Tardive dyskinesia     Past Surgical History:  Procedure Laterality Date  .  APPENDECTOMY    . COLONOSCOPY  06/16/2012   Procedure: COLONOSCOPY;  Surgeon: Lafayette Dragon, MD;  Location: [facility];  Service: Endoscopy;  Laterality: N/A;  . DILATION AND CURETTAGE OF UTERUS    . KNEE ARTHROSCOPY WITH MENISCAL REPAIR Left     Social History   Tobacco Use  . Smoking status: Never Smoker  . Smokeless tobacco: Never Used  Substance Use Topics  . Alcohol use: Yes    Comment: occasionaly 1 a month  . Drug use: No    Family History  Problem Relation Age of Onset  . Colon polyps Mother   . Cancer Mother   . Heart disease Mother   . Alcoholism Mother   . Cancer Father   . Alcoholism Father   . Bipolar disorder Father   . Drug abuse Brother   . Bipolar disorder Other   . Colon cancer Neg Hx   . Stomach cancer Neg Hx   . Breast cancer Neg Hx     Allergies  Allergen Reactions  . Lamictal [Lamotrigine]     Acute renal failure  . Ropinirole Nausea And Vomiting  . Codeine Swelling and Rash    Can take hydrocodone  . Penicillins     Abdominal pain    Medication list has been reviewed and updated.  Current Outpatient Medications on File Prior to Visit  Medication Sig Dispense Refill  . clonazePAM (KLONOPIN) 0.5 MG tablet Take 1 tab po q am and q noon prn anxiety 60 tablet 2  . clonazePAM (KLONOPIN) 1 MG tablet TAKE 1 TABLET BY MOUTH AT BEDTIME MAY HAVE 1 TABLET DAILY AS NEEDED FOR ANXIETY 60 tablet 1  . INGREZZA 80 MG CAPS Take 120 mg by mouth daily.     Marland Kitchen levETIRAcetam (KEPPRA) 500 MG tablet TAKE 1 TABLET BY MOUTH TWICE A DAY 60 tablet 11  . lovastatin (MEVACOR) 20 MG tablet TAKE 1 TABLET BY MOUTH EVERY DAY 30 tablet 5  . Melatonin 10 MG TABS Take 1 tablet by mouth as needed. Reported on 01/30/2016     . Multiple Vitamin (MULTIVITAMIN) tablet Take 1 tablet by mouth daily.    . Omeprazole (PRILOSEC PO) Take 20 mg by mouth daily.     . propranolol (INDERAL) 10 MG tablet Take 1-2 tabs po BID prn anxiety 120 tablet 3  . traZODone (DESYREL) 100 MG tablet 1-2  tablets at bedtime as needed for sleep 60 tablet 2  . Valbenazine Tosylate (INGREZZA) 40 MG CAPS Take 40 mg by mouth at bedtime. Take with 80 mg to equal total dose of 120 mg 90 capsule 1  . carbamazepine (CARBATROL) 300 MG 12 hr capsule Take 2 capsules (600 mg  total) by mouth at bedtime for 30 days. (Patient taking differently: Take 300 mg by mouth at bedtime. ) 60 capsule 2  . zolpidem (AMBIEN) 10 MG tablet Take 1 tablet (10 mg total) by mouth at bedtime as needed for up to 30 days. 30 tablet 5   No current facility-administered medications on file prior to visit.     Review of Systems:  As per HPI- otherwise negative. No fever No vomiting She does tend to be constipated which she handles with OTC meds   Physical Examination: Vitals:   02/01/19 1339  BP: 112/80  Pulse: 76  Resp: 16  Temp: 98.6 F (37 C)  SpO2: 97%   Vitals:   02/01/19 1339  Weight: 151 lb (68.5 kg)  Height: 5\' 6"  (1.676 m)   Body mass index is 24.37 kg/m. Ideal Body Weight: Weight in (lb) to have BMI = 25: 154.6  GEN: WDWN, NAD, Non-toxic, A & O x 3, normal weight, looks well  HEENT: Atraumatic, Normocephalic. Neck supple. No masses, No LAD.  Bilateral TM wnl, oropharynx normal.  PEERL,EOMI.   No visible abnormality of the oropharynx Ears and Nose: No external deformity. CV: RRR, No M/G/R. No JVD. No thrill. No extra heart sounds. PULM: CTA B, no wheezes, crackles, rhonchi. No retractions. No resp. distress. No accessory muscle use. ABD: S, NT, ND, +BS. No rebound. No HSM. EXTR: No c/c/e NEURO Normal gait.  PSYCH: Normally interactive. Conversant. Not depressed or anxious appearing.  Calm demeanor.   EKG: NSR, compared with tracing from 2017 no significant change  Assessment and Plan: Fatigue, unspecified type - Plan: CBC, Comprehensive metabolic panel, TSH  Seizure disorder (HCC)  Pre-diabetes - Plan: Hemoglobin A1c  Screening for hyperlipidemia - Plan: Lipid panel  Medication monitoring  encounter - Plan: CBC, Comprehensive metabolic panel, Lipid panel, Carbamazepine Level (Tegretol), total  Exercise intolerance - Plan: EKG 12-Lead  Following up today in a few concerns. [name] has noticed more fatigue and decreased exercise tolerance for the last couple of weeks.  However she strongly denies any chest pain, pressure, or shortness of breath.  EKG is normal today Will obtain labs as above, look for any cause of her fatigue.  Assuming this is normal we will plan to do a stress test.  I have asked her to seek care if she were to worsen or have any other concerns in the meantime  Follow-up: No follow-ups on file.  No orders of the defined types were placed in this encounter.  Orders Placed This Encounter  Procedures  . CBC  . Comprehensive metabolic panel  . Hemoglobin A1c  . Lipid panel  . TSH  . Carbamazepine Level (Tegretol), total  . EKG 12-Lead    Signed Lamar Blinks, MD  addnd 6/15- received her labs  Results for orders placed or performed in visit on 02/01/19  CBC  Result Value Ref Range   WBC 5.4 4.0 - 10.5 K/uL   RBC 5.17 (H) 3.87 - 5.11 Mil/uL   Platelets 195.0 150.0 - 400.0 K/uL   Hemoglobin 15.2 (H) 12.0 - 15.0 g/dL   HCT 45.1 36.0 - 46.0 %   MCV 87.3 78.0 - 100.0 fl   MCHC 33.8 30.0 - 36.0 g/dL   RDW 13.3 11.5 - 15.5 %  Comprehensive metabolic panel  Result Value Ref Range   Sodium 142 135 - 145 mEq/L   Potassium 4.7 3.5 - 5.1 mEq/L   Chloride 105 96 - 112 mEq/L   CO2 28  19 - 32 mEq/L   Glucose, Bld 94 70 - 99 mg/dL   BUN 13 6 - 23 mg/dL   Creatinine, Ser 0.96 0.40 - 1.20 mg/dL   Total Bilirubin 0.4 0.2 - 1.2 mg/dL   Alkaline Phosphatase 128 (H) 39 - 117 U/L   AST 31 0 - 37 U/L   ALT 54 (H) 0 - 35 U/L   Total Protein 6.5 6.0 - 8.3 g/dL   Albumin 4.5 3.5 - 5.2 g/dL   Calcium 9.8 8.4 - 10.5 mg/dL   GFR 58.73 (L) >60.00 mL/min  Hemoglobin A1c  Result Value Ref Range   Hgb A1c MFr Bld 5.6 4.6 - 6.5 %  Lipid panel  Result Value Ref  Range   Cholesterol 197 0 - 200 mg/dL   Triglycerides 126.0 0.0 - 149.0 mg/dL   HDL 56.90 >39.00 mg/dL   VLDL 25.2 0.0 - 40.0 mg/dL   LDL Cholesterol 115 (H) 0 - 99 mg/dL   Total CHOL/HDL Ratio 3    NonHDL 139.96   TSH  Result Value Ref Range   TSH 1.02 0.35 - 4.50 uIU/mL  Carbamazepine Level (Tegretol), total  Result Value Ref Range   Carbamazepine Lvl 5.2 4.0 - 12.0 mg/L   Known history of fatty liver Message to pt

## 2019-02-01 ENCOUNTER — Other Ambulatory Visit: Payer: Self-pay

## 2019-02-01 ENCOUNTER — Ambulatory Visit (INDEPENDENT_AMBULATORY_CARE_PROVIDER_SITE_OTHER): Payer: Medicare Other | Admitting: Family Medicine

## 2019-02-01 ENCOUNTER — Encounter: Payer: Self-pay | Admitting: Family Medicine

## 2019-02-01 VITALS — BP 112/80 | HR 76 | Temp 98.6°F | Resp 16 | Ht 66.0 in | Wt 151.0 lb

## 2019-02-01 DIAGNOSIS — G40909 Epilepsy, unspecified, not intractable, without status epilepticus: Secondary | ICD-10-CM | POA: Diagnosis not present

## 2019-02-01 DIAGNOSIS — R6889 Other general symptoms and signs: Secondary | ICD-10-CM | POA: Diagnosis not present

## 2019-02-01 DIAGNOSIS — Z5181 Encounter for therapeutic drug level monitoring: Secondary | ICD-10-CM

## 2019-02-01 DIAGNOSIS — Z13220 Encounter for screening for lipoid disorders: Secondary | ICD-10-CM | POA: Diagnosis not present

## 2019-02-01 DIAGNOSIS — R5383 Other fatigue: Secondary | ICD-10-CM

## 2019-02-01 DIAGNOSIS — R7303 Prediabetes: Secondary | ICD-10-CM | POA: Diagnosis not present

## 2019-02-01 NOTE — Patient Instructions (Signed)
Great to see you today as always! I will be in touch with your labs.  If all is normal we will plan to do a treadmill exercise test for you

## 2019-02-02 LAB — COMPREHENSIVE METABOLIC PANEL
ALT: 54 U/L — ABNORMAL HIGH (ref 0–35)
AST: 31 U/L (ref 0–37)
Albumin: 4.5 g/dL (ref 3.5–5.2)
Alkaline Phosphatase: 128 U/L — ABNORMAL HIGH (ref 39–117)
BUN: 13 mg/dL (ref 6–23)
CO2: 28 mEq/L (ref 19–32)
Calcium: 9.8 mg/dL (ref 8.4–10.5)
Chloride: 105 mEq/L (ref 96–112)
Creatinine, Ser: 0.96 mg/dL (ref 0.40–1.20)
GFR: 58.73 mL/min — ABNORMAL LOW (ref >60.00)
Glucose, Bld: 94 mg/dL (ref 70–99)
Potassium: 4.7 mEq/L (ref 3.5–5.1)
Sodium: 142 mEq/L (ref 135–145)
Total Bilirubin: 0.4 mg/dL (ref 0.2–1.2)
Total Protein: 6.5 g/dL (ref 6.0–8.3)

## 2019-02-02 LAB — LIPID PANEL
Cholesterol: 197 mg/dL (ref 0–200)
HDL: 56.9 mg/dL (ref >39.00)
LDL Cholesterol: 115 mg/dL — ABNORMAL HIGH (ref 0–99)
NonHDL: 139.96
Total CHOL/HDL Ratio: 3
Triglycerides: 126 mg/dL (ref 0.0–149.0)
VLDL: 25.2 mg/dL (ref 0.0–40.0)

## 2019-02-02 LAB — CBC
HCT: 45.1 % (ref 36.0–46.0)
Hemoglobin: 15.2 g/dL — ABNORMAL HIGH (ref 12.0–15.0)
MCHC: 33.8 g/dL (ref 30.0–36.0)
MCV: 87.3 fl (ref 78.0–100.0)
Platelets: 195 10*3/uL (ref 150.0–400.0)
RBC: 5.17 Mil/uL — ABNORMAL HIGH (ref 3.87–5.11)
RDW: 13.3 % (ref 11.5–15.5)
WBC: 5.4 10*3/uL (ref 4.0–10.5)

## 2019-02-02 LAB — TSH: TSH: 1.02 u[IU]/mL (ref 0.35–4.50)

## 2019-02-02 LAB — HEMOGLOBIN A1C: Hgb A1c MFr Bld: 5.6 % (ref 4.6–6.5)

## 2019-02-02 LAB — CARBAMAZEPINE LEVEL, TOTAL: Carbamazepine Lvl: 5.2 mg/L (ref 4.0–12.0)

## 2019-02-05 ENCOUNTER — Encounter: Payer: Self-pay | Admitting: Family Medicine

## 2019-02-05 NOTE — Addendum Note (Signed)
Addended by: Lamar Blinks C on: 02/05/2019 08:22 PM   Modules accepted: Orders

## 2019-02-06 ENCOUNTER — Ambulatory Visit: Payer: Medicare Other | Admitting: Psychiatry

## 2019-02-06 ENCOUNTER — Other Ambulatory Visit: Payer: Self-pay

## 2019-02-06 ENCOUNTER — Encounter: Payer: Self-pay | Admitting: Psychiatry

## 2019-02-06 DIAGNOSIS — G2401 Drug induced subacute dyskinesia: Secondary | ICD-10-CM | POA: Diagnosis not present

## 2019-02-06 DIAGNOSIS — F4310 Post-traumatic stress disorder, unspecified: Secondary | ICD-10-CM

## 2019-02-06 DIAGNOSIS — F5101 Primary insomnia: Secondary | ICD-10-CM

## 2019-02-06 DIAGNOSIS — F319 Bipolar disorder, unspecified: Secondary | ICD-10-CM | POA: Diagnosis not present

## 2019-02-06 NOTE — Progress Notes (Signed)
Theresa [name] [id number] 04-19-1956 63 y.o.  Subjective:   Patient ID:  Theresa [name] is a 63 y.o. (DOB 03-31-1956) female.  Chief Complaint:  Chief Complaint  Patient presents with  . Follow-up    TD, h/o Bipolar and anxiety    HPI Theresa [name] presents to the office today for follow-up of mood, anxiety, and insomnia. She is accompanied by her husband. She reports that she had sudden onset of fatigue and that it persisted for a couple of weeks and is beginning to notice some improvement in fatigue. Saw PCP, Dr. Lorelei [name], on 02/01/19. Labs indicated Carbamazepine level was WNL.   She reports that she stopped Ropinorole on 01/20/19 due to nausea and reports that it has helped with nausea. She denies any worsening in RLS. Reports that she continues to notice Tardive dyskinesia in her feet, toes, hand, and slight orofacial movements. She reports that Tardive Dyskinesia is the same compared to a month ago with some reduction in hand tremor.   Her husband reports that she has been recording some programs that she would not typically record. He reports that she bought some supplements that she did not take. He reports that she is having difficulty sitting still. He reports that her concentration has been poor. He reports that she has been asking him questions about things she normally would not ask.   She reports that her mood has been "great." Denies persistent depression. Denies manic s/s. Denies any recent issues with anxiety. She reports that she has been sleeping well. Recent night sweats. Appetite has been good. She reports that she has been losing weight (intentional) since stopping Olanzapine. She reports that she has had fatigue. Reports that her motivation has also been low. She describes concentration as "ok." Denies SI.   Reports that she is now taking Propranolol only once a day. Reports taking Klonopin only at night.   She denies any impulsive behavior other than buying  supplements on one occasion "because it sounded good at the time."   She reports that she has stopped going to her therapist- "I didn't have anything to discuss."   Past Psychiatric Medication Trials: Olanzapine Seroquel Saphris Depakote-self-injurious behavior Lamictal Keppra Carbamazepine Xanax Klonopin Ambien Trazodone Benztropine Ingrezza  Review of Systems:  Review of Systems  Medications: I have reviewed the patient's current medications.  Current Outpatient Medications  Medication Sig Dispense Refill  . clonazePAM (KLONOPIN) 1 MG tablet TAKE 1 TABLET BY MOUTH AT BEDTIME MAY HAVE 1 TABLET DAILY AS NEEDED FOR ANXIETY 60 tablet 1  . INGREZZA 80 MG CAPS Take 120 mg by mouth daily.     Marland Kitchen levETIRAcetam (KEPPRA) 500 MG tablet TAKE 1 TABLET BY MOUTH TWICE A DAY (Patient taking differently: Take 500 mg by mouth every morning. ) 60 tablet 11  . lovastatin (MEVACOR) 20 MG tablet TAKE 1 TABLET BY MOUTH EVERY DAY 30 tablet 5  . Melatonin 10 MG TABS Take 1 tablet by mouth as needed. Reported on 01/30/2016     . Multiple Vitamin (MULTIVITAMIN) tablet Take 1 tablet by mouth daily.    . propranolol (INDERAL) 10 MG tablet Take 1-2 tabs po BID prn anxiety 120 tablet 3  . traZODone (DESYREL) 100 MG tablet 1-2 tablets at bedtime as needed for sleep 60 tablet 2  . Valbenazine Tosylate (INGREZZA) 40 MG CAPS Take 40 mg by mouth at bedtime. Take with 80 mg to equal total dose of 120 mg 90 capsule 1  . carbamazepine (CARBATROL) 300  MG 12 hr capsule Take 2 capsules (600 mg total) by mouth at bedtime for 30 days. (Patient taking differently: Take 300 mg by mouth at bedtime. ) 60 capsule 2  . clonazePAM (KLONOPIN) 0.5 MG tablet Take 1 tab po q am and q noon prn anxiety (Patient not taking: Reported on 02/06/2019) 60 tablet 2  . Omeprazole (PRILOSEC PO) Take 20 mg by mouth daily.     Marland Kitchen zolpidem (AMBIEN) 10 MG tablet Take 1 tablet (10 mg total) by mouth at bedtime as needed for up to 30 days. 30 tablet 5    No current facility-administered medications for this visit.     Medication Side Effects: None  Allergies:  Allergies  Allergen Reactions  . Lamictal [Lamotrigine]     Acute renal failure  . Ropinirole Nausea And Vomiting  . Codeine Swelling and Rash    Can take hydrocodone  . Penicillins     Abdominal pain    Past Medical History:  Diagnosis Date  . Adenomatous colon polyp   . Allergy   . Anxiety   . Bipolar 1 disorder ([place])   . Blood transfusion without reported diagnosis   . Depression   . Elevated LFTs   . Hepatic steatosis 05/07/13  . Hyperlipidemia   . Seizures ([place])   . Tardive dyskinesia     Family History  Problem Relation Age of Onset  . Colon polyps Mother   . Cancer Mother   . Heart disease Mother   . Alcoholism Mother   . Cancer Father   . Alcoholism Father   . Bipolar disorder Father   . Drug abuse Brother   . Bipolar disorder Other   . Colon cancer Neg Hx   . Stomach cancer Neg Hx   . Breast cancer Neg Hx     Social History   Socioeconomic History  . Marital status: Married    Spouse name: Not on file  . Number of children: Not on file  . Years of education: Not on file  . Highest education level: Not on file  Occupational History  . Not on file  Social Needs  . Financial resource strain: Not on file  . Food insecurity    Worry: Not on file    Inability: Not on file  . Transportation needs    Medical: Not on file    Non-medical: Not on file  Tobacco Use  . Smoking status: Never Smoker  . Smokeless tobacco: Never Used  Substance and Sexual Activity  . Alcohol use: Yes    Comment: occasionaly 1 a month  . Drug use: No  . Sexual activity: Not Currently  Lifestyle  . Physical activity    Days per week: Not on file    Minutes per session: Not on file  . Stress: Not on file  Relationships  . Social Herbalist on phone: Not on file    Gets together: Not on file    Attends religious service: Not on file    Active  member of club or organization: Not on file    Attends meetings of clubs or organizations: Not on file    Relationship status: Not on file  . Intimate partner violence    Fear of current or ex partner: Not on file    Emotionally abused: Not on file    Physically abused: Not on file    Forced sexual activity: Not on file  Other Topics Concern  . 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   Please see review of systems for further details on the patient's review from today.   Objective:   Physical Exam:  There were no vitals taken for this visit.  Physical Exam Constitutional:      General: She is not in acute distress.    Appearance: She is well-developed.  Musculoskeletal:        General: No deformity.  Neurological:     Mental Status: She is alert and oriented to person, place, and time.     Coordination: Coordination normal.  Psychiatric:        Attention and Perception: Attention and perception normal. She does not perceive auditory or visual hallucinations.        Mood and Affect: Mood normal. Mood is not anxious or depressed. Affect is not labile, blunt, angry or inappropriate.        Speech: Speech normal.        Behavior: Behavior is cooperative.        Thought Content: Thought content normal. Thought content does not include homicidal or suicidal ideation. Thought content does not include homicidal or suicidal plan.        Cognition and Memory: Cognition normal. She exhibits impaired recent memory.        Judgment: Judgment normal.     Comments: Insight intact. No delusions.  Restless     Lab Review:     Component Value Date/Time   NA 142 02/01/2019 1427   K 4.7 02/01/2019 1427   CL 105 02/01/2019 1427   CO2 28  02/01/2019 1427   GLUCOSE 94 02/01/2019 1427   BUN 13 02/01/2019 1427   CREATININE 0.96 02/01/2019 1427   CREATININE 0.91 09/19/2015 0959   CALCIUM 9.8 02/01/2019 1427   PROT 6.5 02/01/2019 1427   ALBUMIN 4.5 02/01/2019 1427   AST 31 02/01/2019 1427   ALT 54 (H) 02/01/2019 1427   ALKPHOS 128 (H) 02/01/2019 1427   BILITOT 0.4 02/01/2019 1427       Component Value Date/Time   WBC 5.4 02/01/2019 1427   RBC 5.17 (H) 02/01/2019 1427   HGB 15.2 (H) 02/01/2019 1427   HCT 45.1 02/01/2019 1427   PLT 195.0 02/01/2019 1427   MCV 87.3 02/01/2019 1427   MCV 87.7 02/07/2013 1110   MCH 28.3 03/10/2015 0957   MCHC 33.8 02/01/2019 1427   RDW 13.3 02/01/2019 1427    No results found for: POCLITH, LITHIUM   Lab Results  Component Value Date   CBMZ 5.2 02/01/2019     .res Assessment: Plan:   Discussed continuing current medications at this time in order to determine response to recent medication changes and patient decreasing and discontinuing medications on her own.  Advised patient not to make any further changes until response to other changes can be determined.  Discussed that there have been multiple changes with dopaminergic medications in the last several months, beginning with patient abruptly stopping olanzapine, ropinirole, and Ingrezza for 2 weeks following her surgery, then changes in olanzapine dosage, and then patient abruptly stopping ropinirole.  Discussed that tardive dyskinesia may be  due to withdrawal of atypical antipsychotics and maygradually improve over the next few months.  Recommend continuing Ingrezza 120 mg bedtime for tardive dyskinesia since carbamazepine lowers blood levels of Ingrezza. Discussed continuing carbamazepine extended release 300 mg at bedtime since most recent carbamazepine level was within normal range. Continue Klonopin 1 mg at bedtime for insomnia and anxiety. Continue Ambien and trazodone for insomnia. Continue propanolol as needed for anxiety and  restlessness. Patient to follow-up in 6 weeks or sooner if clinically indicated.  Theresa [name] was seen today for follow-up.  Diagnoses and all orders for this visit:  Bipolar I disorder ([place])  Primary insomnia  Tardive dyskinesia  Posttraumatic stress disorder     Please see After Visit Summary for patient specific instructions.  Future Appointments  Date Time Provider [place]  02/12/2019 10:50 AM [facility] 2 [facility]  02/27/2019 11:00 AM Doree Fudge, PhD [facility] None  03/20/2019 11:00 AM [name], PMHNP [facility] None    No orders of the defined types were placed in this encounter.   -------------------------------

## 2019-02-06 NOTE — Progress Notes (Signed)
   02/06/19 2105  Facial and Oral Movements  Muscles of Facial Expression  (Unable to assess due to pt wearing a facemask)  Lips and Perioral Area  (Unable to assess due to pt wearing a facemask)  Jaw  (Unable to assess due to pt wearing a facemask)  Tongue  (Unable to assess due to pt wearing a facemask)  Extremity Movements  Upper (arms, wrists, hands, fingers) 0  Lower (legs, knees, ankles, toes) 3  Trunk Movements  Neck, shoulders, hips 1  Overall Severity  Severity of abnormal movements (highest score from questions above) 2  Incapacitation due to abnormal movements 0  Patient's awareness of abnormal movements (rate only patient's report) 2  Dental Status  Current problems with teeth and/or dentures? No  Does patient usually wear dentures? No

## 2019-02-12 ENCOUNTER — Ambulatory Visit
Admission: RE | Admit: 2019-02-12 | Discharge: 2019-02-12 | Disposition: A | Discharge status: Home / Self Care | Payer: Medicare Other | Source: Ambulatory Visit | Attending: Obstetrics and Gynecology | Admitting: Obstetrics and Gynecology

## 2019-02-12 DIAGNOSIS — Z1231 Encounter for screening mammogram for malignant neoplasm of breast: Secondary | ICD-10-CM

## 2019-02-15 ENCOUNTER — Other Ambulatory Visit: Payer: Self-pay | Admitting: Psychiatry

## 2019-02-15 DIAGNOSIS — F4310 Post-traumatic stress disorder, unspecified: Secondary | ICD-10-CM

## 2019-02-19 ENCOUNTER — Other Ambulatory Visit: Payer: Self-pay

## 2019-02-19 ENCOUNTER — Telehealth (INDEPENDENT_AMBULATORY_CARE_PROVIDER_SITE_OTHER): Payer: Medicare Other | Admitting: Neurology

## 2019-02-19 ENCOUNTER — Encounter: Payer: Self-pay | Admitting: Neurology

## 2019-02-19 ENCOUNTER — Ambulatory Visit: Payer: Self-pay | Admitting: *Deleted

## 2019-02-19 DIAGNOSIS — F3170 Bipolar disorder, currently in remission, most recent episode unspecified: Secondary | ICD-10-CM

## 2019-02-19 MED ORDER — CARBAMAZEPINE ER 300 MG PO CP12: ORAL_CAPSULE | ORAL | 3 refills | Status: DC

## 2019-02-19 MED ORDER — LEVETIRACETAM 500 MG PO TABS: 500.0000 mg | ORAL_TABLET | Freq: Two times a day (BID) | ORAL | 3 refills | Status: DC
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
## 2019-02-27 ENCOUNTER — Ambulatory Visit: Payer: Medicare Other | Admitting: Psychology

## 2019-03-04 ENCOUNTER — Telehealth: Payer: Self-pay | Admitting: Psychiatry

## 2019-03-04 DIAGNOSIS — F319 Bipolar disorder, unspecified: Secondary | ICD-10-CM

## 2019-03-04 DIAGNOSIS — F3170 Bipolar disorder, currently in remission, most recent episode unspecified: Secondary | ICD-10-CM

## 2019-03-04 NOTE — Telephone Encounter (Signed)
[name] herself phones after hours communicating effectively cognitively without family assistance reporting several days of depression and anxiety from which she hopes to start getting relief.  She considers this unusual as she has much more often been manic with her bipolar disorder, but in the past her previous medication management provided Xanax as a sedative when she considers her Klonopin 1 mg to not do much even for anxiety.  She has no current mixed manic, psychotic, dissociative, or delirious signs or symptoms by phone tonight.  Medication management is complex with seizures considered in recent neuro review to be contained even though medication reduction was fortunate to produce no breakthrough seizures.  She has stopped medications for RLS she attributes to mania, and she is treated for tardive.  She concludes that she can take her Klonopin tonight and that her pharmacy [facility] is closed tonight anyway.  She would appreciate office conclusion advising whether Klonopin might switch to Xanax or at least Xanax to be used briefly in its place to help remit anxiety and depression as in the past.  She is pleased to have follow-up instructions, advice or opinion by phone tomorrow morning or just pick up the Xanax if such is sent to [facility].  She concludes she will be fine tonight just going to bed with her current sedative she considers inadequate knowing she can start getting help soon.

## 2019-03-04 NOTE — Telephone Encounter (Signed)
After hours phone intervention completed with entry error into former phone contact rectified as possible forwarded for therapeutic conclusions tomorrow in office.

## 2019-03-05 MED ORDER — OLANZAPINE 10 MG PO TABS: 5.0000 mg | ORAL_TABLET | Freq: Every day | ORAL | 0 refills | Status: DC

## 2019-03-05 MED ORDER — CARBAMAZEPINE ER 300 MG PO CP12: 600.0000 mg | ORAL_CAPSULE | Freq: Every day | ORAL | 0 refills | Status: DC

## 2019-03-05 NOTE — Telephone Encounter (Addendum)
She reports that she has had sudden onset of depressive s/s in the last 1-2 weeks. She reports that she has been having heightened anxiety about recent depressive s/s and not wanting to be hospitalized. She reports that Klonopin and Propranolol have not been helpful for recent anxiety. She reports that in the past she would take Xanax to help with depression. She reports racing thoughts and mood is "terrible" and has been staying in bed and not functioning well. Denies irritability. Denies impulsive or risky behavior. Denies SIB or SI. Reports that she has had passive death wishes.  She reports that she started having night sweats about a month ago when she started to have severe fatigue as well.  Reports that she has been sleeping excessively aside from being awakened with hot flashes.   Denies any seizure like activity.   Reviewed past medical records from Dr. Sheralyn Boatman.   Plan: Increase Carbamazepine ER to 300 mg two capsules po QHS for mood stabilization. Re-start Zyprexa 5 mg po QHS for mood stabilization since this appeared to be most effective for past depressive s/s. Pt advised to contact office at the end of the week with update re: s/s and to call sooner if s/s worsen or she has worsening TD.

## 2019-03-05 NOTE — Addendum Note (Signed)
Addended by: Sharyl Nimrod on: 03/05/2019 03:57 PM   Modules accepted: Orders

## 2019-03-09 ENCOUNTER — Telehealth: Payer: Self-pay | Admitting: Psychiatry

## 2019-03-09 NOTE — Telephone Encounter (Signed)
FYI:  Patient left vm today @10 :08 stated she was calling to let you know she is doing better, still a little on the manic side but she's good.

## 2019-03-12 ENCOUNTER — Ambulatory Visit (INDEPENDENT_AMBULATORY_CARE_PROVIDER_SITE_OTHER): Payer: Medicare Other | Admitting: Psychology

## 2019-03-12 DIAGNOSIS — F449 Dissociative and conversion disorder, unspecified: Secondary | ICD-10-CM

## 2019-03-12 DIAGNOSIS — F3181 Bipolar II disorder: Secondary | ICD-10-CM | POA: Diagnosis not present

## 2019-03-12 NOTE — Telephone Encounter (Signed)
Left voicemail to call back with an update.

## 2019-03-15 ENCOUNTER — Other Ambulatory Visit: Payer: Self-pay | Admitting: Psychiatry

## 2019-03-15 DIAGNOSIS — L821 Other seborrheic keratosis: Secondary | ICD-10-CM | POA: Diagnosis not present

## 2019-03-15 DIAGNOSIS — L814 Other melanin hyperpigmentation: Secondary | ICD-10-CM | POA: Diagnosis not present

## 2019-03-15 DIAGNOSIS — L718 Other rosacea: Secondary | ICD-10-CM | POA: Diagnosis not present

## 2019-03-16 NOTE — Telephone Encounter (Signed)
Spoke with pt and she's doing very well she said, she has a follow up next week with [name] but asking if she can do telehealth. Transferred her to front office staff to discuss

## 2019-03-20 ENCOUNTER — Other Ambulatory Visit: Payer: Self-pay

## 2019-03-20 ENCOUNTER — Encounter: Payer: Self-pay | Admitting: Psychiatry

## 2019-03-20 ENCOUNTER — Ambulatory Visit (INDEPENDENT_AMBULATORY_CARE_PROVIDER_SITE_OTHER): Payer: Medicare Other | Admitting: Psychiatry

## 2019-03-20 DIAGNOSIS — F4310 Post-traumatic stress disorder, unspecified: Secondary | ICD-10-CM

## 2019-03-20 DIAGNOSIS — F319 Bipolar disorder, unspecified: Secondary | ICD-10-CM

## 2019-03-20 DIAGNOSIS — G2401 Drug induced subacute dyskinesia: Secondary | ICD-10-CM | POA: Diagnosis not present

## 2019-03-20 NOTE — Progress Notes (Signed)
Theresa [name] [id number] 29-Feb-1956 63 y.o.  Virtual Visit via Telephone Note  I connected with pt on 03/20/19 at 11:00 AM EDT by telephone and verified that I am speaking with the correct person using two identifiers.   I discussed the limitations, risks, security and privacy concerns of performing an evaluation and management service by telephone and the availability of in person appointments. I also discussed with the patient that there may be a patient responsible charge related to this service. The patient expressed understanding and agreed to proceed.   I discussed the assessment and treatment plan with the patient. The patient was provided an opportunity to ask questions and all were answered. The patient agreed with the plan and demonstrated an understanding of the instructions.   The patient was advised to call back or seek an in-person evaluation if the symptoms worsen or if the condition fails to improve as anticipated.  I provided 15 minutes of non-face-to-face time during this encounter.  The patient was located at home.  The provider was located at [place].   [name], PMHNP   Subjective:   Patient ID:  Theresa [name] is a 63 y.o. (DOB [birth year]) female.  Chief Complaint:  Chief Complaint  Patient presents with  . Manic Behavior    HPI [name] presents for follow-up of recent worsening depression.  Patient's husband participates periodically during tele-visit.  Patient reports that she is doing "much better" compared to when she contacted office on July 17 with worsening depression.  She reports that her mood shifted from depression to hypomania about 2 days after re-starting Zyprexa and increasing Carbamazepine ER. She reports that she is no longer experiencing depression.   She reports that her mood has been elevated. Denies irritability. She reports that she is having some racing thoughts and talking faster. She reports that her  thoughts are bouncing round. She reports that she has been able to concentrate on books. She reports that she is sleeping 8-9 hours a night with taking Trazodone two tabs po QHS. She denies impulsivity. Her husband reports one impulsive purchase.  She reports that her energy has been "good, not high." Denies increased goal-directed activity. Appetite has been ok. Denies SI.   She reports that her anxiety has been well controlled.   She reports that her tardive dyskinesia remains the same. Notices TD in feet and tongue. She reports that she recently saw her neurologist who recommended continuing current treatment. Reports that neurologist recommended that she re-start Keppra BID instead of qd.  Taking one Klonopin and Propranolol every night.  Review of Systems:  Review of Systems  Constitutional:       She reports that night sweats resolved after recent medications.   Musculoskeletal: Negative for gait problem.  Neurological: Positive for tremors.       No change in tremor.  Psychiatric/Behavioral:       Please refer to HPI    Medications: I have reviewed the patient's current medications.  Current Outpatient Medications  Medication Sig Dispense Refill  . carbamazepine (CARBATROL) 300 MG 12 hr capsule Take 2 capsules (600 mg total) by mouth at bedtime. 90 capsule 0  . clonazePAM (KLONOPIN) 1 MG tablet TAKE 1 TABLET BY MOUTH AT BEDTIME MAY HAVE 1 TABLET DAILY AS NEEDED FOR ANXIETY 60 tablet 1  . INGREZZA 80 MG CAPS Take 120 mg by mouth daily.     Marland Kitchen levETIRAcetam (KEPPRA) 500 MG tablet Take 1 tablet (500 mg total) by  mouth 2 (two) times daily. 180 tablet 3  . lovastatin (MEVACOR) 20 MG tablet TAKE 1 TABLET BY MOUTH EVERY DAY 30 tablet 5  . Melatonin 10 MG TABS Take 1 tablet by mouth as needed. Reported on 01/30/2016     . Multiple Vitamin (MULTIVITAMIN) tablet Take 1 tablet by mouth daily.    Marland Kitchen OLANZapine (ZYPREXA) 10 MG tablet Take 0.5 tablets (5 mg total) by mouth at bedtime. 30 tablet  0  . propranolol (INDERAL) 10 MG tablet TAKE 1 TO 2 TABLETS BY MOUTH TWICE A DAY AS NEEDED FOR ANXIETY 120 tablet 3  . traZODone (DESYREL) 100 MG tablet 1-2 tablets at bedtime as needed for sleep 60 tablet 2  . zolpidem (AMBIEN) 10 MG tablet Take 1 tablet (10 mg total) by mouth at bedtime as needed for up to 30 days. 30 tablet 5   No current facility-administered medications for this visit.     Medication Side Effects: None  Allergies:  Allergies  Allergen Reactions  . Lamictal [Lamotrigine]     Acute renal failure  . Ropinirole Nausea And Vomiting  . Codeine Swelling and Rash    Can take hydrocodone  . Penicillins     Abdominal pain    Past Medical History:  Diagnosis Date  . Adenomatous colon polyp   . Allergy   . Anxiety   . Bipolar 1 disorder ([place])   . Blood transfusion without reported diagnosis   . Depression   . Elevated LFTs   . Hepatic steatosis 05/07/13  . Hyperlipidemia   . Seizures ([place])   . Tardive dyskinesia     Family History  Problem Relation Age of Onset  . Colon polyps Mother   . Cancer Mother   . Heart disease Mother   . Alcoholism Mother   . Cancer Father   . Alcoholism Father   . Bipolar disorder Father   . Drug abuse Brother   . Bipolar disorder Other   . Colon cancer Neg Hx   . Stomach cancer Neg Hx   . Breast cancer Neg Hx     Social History   Socioeconomic History  . Marital status: Married    Spouse name: Not on file  . Number of children: Not on file  . Years of education: Not on file  . Highest education level: Not on file  Occupational History  . Not on file  Social Needs  . Financial resource strain: Not on file  . Food insecurity    Worry: Not on file    Inability: Not on file  . Transportation needs    Medical: Not on file    Non-medical: Not on file  Tobacco Use  . Smoking status: Never Smoker  . Smokeless tobacco: Never Used  Substance and Sexual Activity  . Alcohol use: Yes    Comment: occasionaly 1 a month   . Drug use: No  . Sexual activity: Not Currently  Lifestyle  . Physical activity    Days per week: Not on file    Minutes per session: Not on file  . Stress: Not on file  Relationships  . Social Herbalist on phone: Not on file    Gets together: Not on file    Attends religious service: Not on file    Active member of club or organization: Not on file    Attends meetings of clubs or organizations: Not on file    Relationship status: Not on file  .  Intimate partner violence    Fear of current or ex partner: Not on file    Emotionally abused: Not on file    Physically abused: Not on file    Forced sexual activity: Not on file  Other Topics Concern  . 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   Please see review of systems for further details on the patient's review from today.   Objective:   Physical Exam:  There were no vitals taken for this visit.  Physical Exam Neurological:     Mental Status: She is alert and oriented to person, place, and time.     Cranial Nerves: No dysarthria.  Psychiatric:        Attention and Perception: Attention normal.        Mood and Affect: Mood is elated.        Speech: Speech normal.        Behavior: Behavior is cooperative.        Thought Content: Thought content normal. Thought content is not paranoid or delusional. Thought content does not include homicidal or suicidal ideation. Thought content does not include homicidal or suicidal plan.        Cognition and Memory: Cognition and memory normal.        Judgment: Judgment normal.     Comments: More spontaneous verbal interaction compared to baseline.     Lab Review:     Component Value Date/Time   NA 142 02/01/2019 1427   K 4.7  02/01/2019 1427   CL 105 02/01/2019 1427   CO2 28 02/01/2019 1427   GLUCOSE 94 02/01/2019 1427   BUN 13 02/01/2019 1427   CREATININE 0.96 02/01/2019 1427   CREATININE 0.91 09/19/2015 0959   CALCIUM 9.8 02/01/2019 1427   PROT 6.5 02/01/2019 1427   ALBUMIN 4.5 02/01/2019 1427   AST 31 02/01/2019 1427   ALT 54 (H) 02/01/2019 1427   ALKPHOS 128 (H) 02/01/2019 1427   BILITOT 0.4 02/01/2019 1427       Component Value Date/Time   WBC 5.4 02/01/2019 1427   RBC 5.17 (H) 02/01/2019 1427   HGB 15.2 (H) 02/01/2019 1427   HCT 45.1 02/01/2019 1427   PLT 195.0 02/01/2019 1427   MCV 87.3 02/01/2019 1427   MCV 87.7 02/07/2013 1110   MCH 28.3 03/10/2015 0957   MCHC 33.8 02/01/2019 1427   RDW 13.3 02/01/2019 1427    No results found for: POCLITH, LITHIUM   Lab Results  Component Value Date   CBMZ 5.2 02/01/2019     .res Assessment: Plan:   Will continue current medications since more time is needed to determine response to recent increase in carbamazepine extended release and resuming olanzapine.  Discussed contacting office if mild hypomanic signs and symptoms do not improve or if there are any signs of worsening mania to include impulsivity, sleeplessness, or excessive spending.  Discussed that increase in olanzapine may be considered in the future if manic signs and symptoms do not improve. Patient to follow-up in 4 weeks or sooner if clinically indicated.  Patient reports that she does not need any refills at this time. Shatara was seen today for manic behavior.  Diagnoses and all orders for this  visit:  Bipolar I disorder ([place])  Posttraumatic stress disorder  Tardive dyskinesia    Please see After Visit Summary for patient specific instructions.  Future Appointments  Date Time Provider [place]  04/11/2019 10:30 AM Doree Fudge, PhD [facility] None  04/25/2019 10:30 AM Doree Fudge, PhD [facility] None  04/26/2019 11:00 AM [name], PMHNP [facility] None   09/28/2019  3:30 PM Cameron Sprang, MD [facility] None    No orders of the defined types were placed in this encounter.     -------------------------------

## 2019-03-27 DIAGNOSIS — M542 Cervicalgia: Secondary | ICD-10-CM | POA: Diagnosis not present

## 2019-04-11 ENCOUNTER — Ambulatory Visit (INDEPENDENT_AMBULATORY_CARE_PROVIDER_SITE_OTHER): Payer: Medicare Other | Admitting: Psychology

## 2019-04-11 DIAGNOSIS — F3181 Bipolar II disorder: Secondary | ICD-10-CM

## 2019-04-11 DIAGNOSIS — F449 Dissociative and conversion disorder, unspecified: Secondary | ICD-10-CM

## 2019-04-14 ENCOUNTER — Other Ambulatory Visit: Payer: Self-pay | Admitting: Psychiatry

## 2019-04-14 DIAGNOSIS — F4310 Post-traumatic stress disorder, unspecified: Secondary | ICD-10-CM

## 2019-04-14 DIAGNOSIS — F5101 Primary insomnia: Secondary | ICD-10-CM

## 2019-04-16 ENCOUNTER — Telehealth: Payer: Self-pay | Admitting: Psychiatry

## 2019-04-16 ENCOUNTER — Other Ambulatory Visit: Payer: Self-pay

## 2019-04-16 DIAGNOSIS — F5101 Primary insomnia: Secondary | ICD-10-CM

## 2019-04-16 MED ORDER — ZOLPIDEM TARTRATE 10 MG PO TABS: ORAL_TABLET | ORAL | 0 refills | Status: DC

## 2019-04-16 NOTE — Telephone Encounter (Signed)
Pt called requesting her Ambien be filled at different pharmacy. Stated it was submitted already,but not filled. The pharmacy will not transfer because of being a controlled substance. [facility] [id number].

## 2019-04-16 NOTE — Telephone Encounter (Signed)
Has follow up 09/03

## 2019-04-25 ENCOUNTER — Ambulatory Visit: Payer: Medicare Other | Admitting: Psychology

## 2019-04-26 ENCOUNTER — Encounter: Payer: Self-pay | Admitting: Psychiatry

## 2019-04-26 ENCOUNTER — Ambulatory Visit (INDEPENDENT_AMBULATORY_CARE_PROVIDER_SITE_OTHER): Payer: Medicare Other | Admitting: Psychiatry

## 2019-04-26 ENCOUNTER — Other Ambulatory Visit: Payer: Self-pay

## 2019-04-26 ENCOUNTER — Ambulatory Visit (INDEPENDENT_AMBULATORY_CARE_PROVIDER_SITE_OTHER): Payer: Medicare Other | Admitting: Psychology

## 2019-04-26 DIAGNOSIS — F4310 Post-traumatic stress disorder, unspecified: Secondary | ICD-10-CM | POA: Diagnosis not present

## 2019-04-26 DIAGNOSIS — G2401 Drug induced subacute dyskinesia: Secondary | ICD-10-CM | POA: Diagnosis not present

## 2019-04-26 DIAGNOSIS — F3181 Bipolar II disorder: Secondary | ICD-10-CM

## 2019-04-26 DIAGNOSIS — F3170 Bipolar disorder, currently in remission, most recent episode unspecified: Secondary | ICD-10-CM | POA: Diagnosis not present

## 2019-04-26 DIAGNOSIS — F449 Dissociative and conversion disorder, unspecified: Secondary | ICD-10-CM | POA: Diagnosis not present

## 2019-04-26 DIAGNOSIS — F5101 Primary insomnia: Secondary | ICD-10-CM

## 2019-04-26 MED ORDER — ZOLPIDEM TARTRATE 10 MG PO TABS: ORAL_TABLET | ORAL | 5 refills | Status: DC

## 2019-04-26 NOTE — Progress Notes (Signed)
   04/26/19 1131  Facial and Oral Movements  Muscles of Facial Expression 1  Lips and Perioral Area 1  Jaw 1  Tongue 0  Extremity Movements  Upper (arms, wrists, hands, fingers) 1  Lower (legs, knees, ankles, toes) 3  Trunk Movements  Neck, shoulders, hips 1  Overall Severity  Severity of abnormal movements (highest score from questions above) 3  Incapacitation due to abnormal movements 4  Patient's awareness of abnormal movements (rate only patient's report) 3  Dental Status  Current problems with teeth and/or dentures? No  Does patient usually wear dentures? No  AIMS Total Score  AIMS Total Score 18

## 2019-04-26 NOTE — Progress Notes (Signed)
Theresa [name]:[id number] 09-18-1955 63 y.o.  Subjective:   Patient ID:  Theresa [name] is a 63 y.o. (DOB 12-31-1955) female.  Chief Complaint:  Chief Complaint  Patient presents with  . Other    Tardive dyskinesia  . Follow-up    History of anxiety, mood instability, insomnia    HPI Theresa [name] presents to the office today for follow-up of mood, anxiety, and insomnia. They recently traveled to the beach.   Reports that she received Ingrezza 80 mg only and reports that she did not notice any improvement in TD with higher dose of Austedo. She reports that she has difficulty sitting still during the day and writing due to hand tremor. Reports that her leg, feet and toes move constantly. Reports that RLS has been well controlled.   She reports that she has been sleeping well. Mood remains slightly elevated without causing negative consequences. They deny any recent risky or impulsive behavior to include excessive spending. They deny any persistent depression. Husband reports that she no longer seems to have excessive somnolence most mornings. He reports occ days where she sleeps some in the day. They report at times sleep quality is diminished. Energy and motivation have been low. Concentration has been adequate. Denies SI.   Completed some artwork and work was displayed in [employer].   Past Psychiatric Medication Trials: Olanzapine Seroquel Saphris Depakote-self-injurious behavior Lamictal Keppra Carbamazepine Xanax Klonopin Ambien Trazodone Benztropine Ingrezza Propranolol- Ineffective   Review of Systems:  Review of Systems  Gastrointestinal: Positive for constipation.  Musculoskeletal: Negative for gait problem.  Neurological: Positive for tremors.  Psychiatric/Behavioral:       Please refer to HPI    Medications: I have reviewed the patient's current medications.  Current Outpatient Medications  Medication Sig Dispense Refill  . bisacodyl (DULCOLAX) 5 MG EC  tablet Take 5 mg by mouth daily as needed for moderate constipation.    . carbamazepine (CARBATROL) 300 MG 12 hr capsule Take 2 capsules (600 mg total) by mouth at bedtime. 90 capsule 0  . clonazePAM (KLONOPIN) 1 MG tablet TAKE 1 TABLET BY MOUTH AT BEDTIME MAY ALSO HAVE 1 TABLET EVERY DAY AS NEEDED FOR ANXIETY 60 tablet 2  . docusate sodium (COLACE) 100 MG capsule Take 100 mg by mouth daily as needed for mild constipation.    . INGREZZA 80 MG CAPS Take 120 mg by mouth daily.     Marland Kitchen levETIRAcetam (KEPPRA) 500 MG tablet Take 1 tablet (500 mg total) by mouth 2 (two) times daily. 180 tablet 3  . lovastatin (MEVACOR) 20 MG tablet TAKE 1 TABLET BY MOUTH EVERY DAY 30 tablet 5  . Melatonin 10 MG TABS Take 1 tablet by mouth as needed. Reported on 01/30/2016     . Multiple Vitamin (MULTIVITAMIN) tablet Take 1 tablet by mouth daily.    Marland Kitchen OLANZapine (ZYPREXA) 10 MG tablet Take 0.5 tablets (5 mg total) by mouth at bedtime. 30 tablet 0  . traZODone (DESYREL) 100 MG tablet 1-2 tablets at bedtime as needed for sleep 60 tablet 2  . [START ON 05/14/2019] zolpidem (AMBIEN) 10 MG tablet TAKE 1 TABLET BY MOUTH AT BEDTIME AS NEEDED FOR UP TO 30 DAYS 30 tablet 5   No current facility-administered medications for this visit.     Medication Side Effects: Other: TD, Constipation  Allergies:  Allergies  Allergen Reactions  . Lamictal [Lamotrigine]     Acute renal failure  . Ropinirole Nausea And Vomiting  . Codeine Swelling and  Rash    Can take hydrocodone  . Penicillins     Abdominal pain    Past Medical History:  Diagnosis Date  . Adenomatous colon polyp   . Allergy   . Anxiety   . Bipolar 1 disorder ([place])   . Blood transfusion without reported diagnosis   . Depression   . Elevated LFTs   . Hepatic steatosis 05/07/13  . Hyperlipidemia   . Seizures ([place])   . Tardive dyskinesia     Family History  Problem Relation Age of Onset  . Colon polyps Mother   . Cancer Mother   . Heart disease Mother   .  Alcoholism Mother   . Cancer Father   . Alcoholism Father   . Bipolar disorder Father   . Drug abuse Brother   . Bipolar disorder Other   . Colon cancer Neg Hx   . Stomach cancer Neg Hx   . Breast cancer Neg Hx     Social History   Socioeconomic History  . Marital status: Married    Spouse name: Not on file  . Number of children: Not on file  . Years of education: Not on file  . Highest education level: Not on file  Occupational History  . Not on file  Social Needs  . Financial resource strain: Not on file  . Food insecurity    Worry: Not on file    Inability: Not on file  . Transportation needs    Medical: Not on file    Non-medical: Not on file  Tobacco Use  . Smoking status: Never Smoker  . Smokeless tobacco: Never Used  Substance and Sexual Activity  . Alcohol use: Yes    Comment: occasionaly 1 a month  . Drug use: No  . Sexual activity: Not Currently  Lifestyle  . Physical activity    Days per week: Not on file    Minutes per session: Not on file  . Stress: Not on file  Relationships  . Social Herbalist on phone: Not on file    Gets together: Not on file    Attends religious service: Not on file    Active member of club or organization: Not on file    Attends meetings of clubs or organizations: Not on file    Relationship status: Not on file  . Intimate partner violence    Fear of current or ex partner: Not on file    Emotionally abused: Not on file    Physically abused: Not on file    Forced sexual activity: Not on file  Other Topics Concern  . 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   Please see review of systems for further details on the patient's review from today.   Objective:   Physical  Exam:  There were no vitals taken for this visit.  Physical Exam Constitutional:      General: She is not in acute distress.    Appearance: She is well-developed.  Musculoskeletal:        General: No deformity.  Neurological:     Mental Status: She is alert and oriented to person, place, and time.     Coordination: Coordination  normal.  Psychiatric:        Attention and Perception: Attention and perception normal. She does not perceive auditory or visual hallucinations.        Mood and Affect: Mood normal. Mood is not anxious or depressed. Affect is not labile, blunt, angry or inappropriate.        Speech: Speech normal.        Behavior: Behavior is cooperative.        Thought Content: Thought content normal. Thought content does not include homicidal or suicidal ideation. Thought content does not include homicidal or suicidal plan.        Cognition and Memory: Cognition and memory normal.        Judgment: Judgment normal.     Comments: Insight intact. No delusions.  Restless.  Shifting in chair throughout exam.     Lab Review:     Component Value Date/Time   NA 142 02/01/2019 1427   K 4.7 02/01/2019 1427   CL 105 02/01/2019 1427   CO2 28 02/01/2019 1427   GLUCOSE 94 02/01/2019 1427   BUN 13 02/01/2019 1427   CREATININE 0.96 02/01/2019 1427   CREATININE 0.91 09/19/2015 0959   CALCIUM 9.8 02/01/2019 1427   PROT 6.5 02/01/2019 1427   ALBUMIN 4.5 02/01/2019 1427   AST 31 02/01/2019 1427   ALT 54 (H) 02/01/2019 1427   ALKPHOS 128 (H) 02/01/2019 1427   BILITOT 0.4 02/01/2019 1427       Component Value Date/Time   WBC 5.4 02/01/2019 1427   RBC 5.17 (H) 02/01/2019 1427   HGB 15.2 (H) 02/01/2019 1427   HCT 45.1 02/01/2019 1427   PLT 195.0 02/01/2019 1427   MCV 87.3 02/01/2019 1427   MCV 87.7 02/07/2013 1110   MCH 28.3 03/10/2015 0957   MCHC 33.8 02/01/2019 1427   RDW 13.3 02/01/2019 1427    No results found for: POCLITH, LITHIUM   Lab Results  Component Value  Date   CBMZ 5.2 02/01/2019     .res Assessment: Plan:   Patient seen for 30 minutes and greater than 50% of visit spent counseling patient and her husband regarding tardive dyskinesia and distinguishing tardive dyskinesia from restless leg syndrome.  Discussed possible treatment options and that Ingrezza may be less effective due to concomitant use with carbamazepine and therefore she may have greater benefit with Austedo.  Patient reports that she would prefer to start with trying to take Ingrezza sooner earlier in the evening or in the morning to increase benefit and then consider switching to Austedo if no significant improvement in tardive dyskinesia. Will continue current medications. Patient to follow-up in 2 months or sooner if clinically indicated. Patient advised to contact office with any questions, adverse effects, or acute worsening in signs and symptoms.  Analayah was seen today for other and follow-up.  Diagnoses and all orders for this visit:  Bipolar disorder in partial remission, most recent episode unspecified type ([place])  Primary insomnia -     zolpidem (AMBIEN) 10 MG tablet; TAKE 1 TABLET BY MOUTH AT BEDTIME AS NEEDED FOR UP TO 30 DAYS  Tardive dyskinesia  Posttraumatic stress disorder     Please see After Visit Summary for patient specific instructions.  Future Appointments  Date Time Provider [place]  05/10/2019 12:30 PM Doree Fudge, PhD [facility] None  05/24/2019 12:30 PM Doree Fudge, PhD [facility] None  06/07/2019 12:30 PM Doree Fudge, PhD [facility] None  06/21/2019 12:30 PM Doree Fudge, PhD  [facility] None  06/26/2019  1:00 PM [name], [place] [facility] None  07/05/2019 12:30 PM Doree Fudge, PhD [facility] None  08/02/2019 12:30 PM Doree Fudge, PhD [facility] None  08/16/2019 12:30 PM Doree Fudge, PhD [facility] None  09/28/2019  3:30 PM Cameron Sprang, MD [facility] None    No orders of the defined types were placed in  this encounter.   -------------------------------

## 2019-05-01 ENCOUNTER — Other Ambulatory Visit: Payer: Self-pay | Admitting: Family Medicine

## 2019-05-01 DIAGNOSIS — L570 Actinic keratosis: Secondary | ICD-10-CM | POA: Diagnosis not present

## 2019-05-01 DIAGNOSIS — L718 Other rosacea: Secondary | ICD-10-CM | POA: Diagnosis not present

## 2019-05-01 DIAGNOSIS — L218 Other seborrheic dermatitis: Secondary | ICD-10-CM | POA: Diagnosis not present

## 2019-05-03 ENCOUNTER — Telehealth: Payer: Self-pay | Admitting: Psychiatry

## 2019-05-03 NOTE — Telephone Encounter (Signed)
Theresa [name] called to ask that she be changed from [place] to [place] because the [employer] isn't working.  She said she signed a form for financial assistance for the Austedo.   Appt. 11/3

## 2019-05-03 NOTE — Telephone Encounter (Signed)
I would titrate the Ingrezzq over 1-2 weeks as you planned and start the austedo starter dose now.  This is educated guess, no data on this switch

## 2019-05-09 ENCOUNTER — Ambulatory Visit: Payer: Medicare Other | Admitting: Psychology

## 2019-05-10 ENCOUNTER — Telehealth: Payer: Self-pay | Admitting: Psychiatry

## 2019-05-10 ENCOUNTER — Ambulatory Visit (INDEPENDENT_AMBULATORY_CARE_PROVIDER_SITE_OTHER): Payer: Medicare Other | Admitting: Psychology

## 2019-05-10 DIAGNOSIS — F3181 Bipolar II disorder: Secondary | ICD-10-CM

## 2019-05-10 DIAGNOSIS — F449 Dissociative and conversion disorder, unspecified: Secondary | ICD-10-CM | POA: Diagnosis not present

## 2019-05-10 DIAGNOSIS — F4310 Post-traumatic stress disorder, unspecified: Secondary | ICD-10-CM

## 2019-05-10 MED ORDER — ALPRAZOLAM 0.5 MG PO TABS: 0.5000 mg | ORAL_TABLET | Freq: Four times a day (QID) | ORAL | 0 refills | Status: DC | PRN

## 2019-05-10 NOTE — Telephone Encounter (Signed)
She verbalized understanding and advised to call back with worsening or no improvement. Very appreciative.

## 2019-05-10 NOTE — Telephone Encounter (Signed)
Patient was having suicidal thoughts, not trying to cause any harm feeling more depressed, patient would like for you to call her in a Xanax so she can calm down, [facility] [address].

## 2019-05-14 ENCOUNTER — Other Ambulatory Visit (HOSPITAL_COMMUNITY)
Admission: RE | Admit: 2019-05-14 | Discharge: 2019-05-14 | Disposition: A | Discharge status: Home / Self Care | Payer: Medicare Other | Source: Ambulatory Visit | Attending: Obstetrics and Gynecology | Admitting: Obstetrics and Gynecology

## 2019-05-14 DIAGNOSIS — H02831 Dermatochalasis of right upper eyelid: Secondary | ICD-10-CM | POA: Diagnosis not present

## 2019-05-14 DIAGNOSIS — Z01419 Encounter for gynecological examination (general) (routine) without abnormal findings: Secondary | ICD-10-CM | POA: Insufficient documentation

## 2019-05-14 DIAGNOSIS — Z9189 Other specified personal risk factors, not elsewhere classified: Secondary | ICD-10-CM | POA: Diagnosis not present

## 2019-05-14 DIAGNOSIS — H40033 Anatomical narrow angle, bilateral: Secondary | ICD-10-CM | POA: Diagnosis not present

## 2019-05-14 DIAGNOSIS — H02834 Dermatochalasis of left upper eyelid: Secondary | ICD-10-CM | POA: Diagnosis not present

## 2019-05-14 DIAGNOSIS — H25813 Combined forms of age-related cataract, bilateral: Secondary | ICD-10-CM | POA: Diagnosis not present

## 2019-05-14 DIAGNOSIS — H31093 Other chorioretinal scars, bilateral: Secondary | ICD-10-CM | POA: Diagnosis not present

## 2019-05-15 ENCOUNTER — Telehealth: Payer: Self-pay | Admitting: Psychiatry

## 2019-05-15 DIAGNOSIS — F4310 Post-traumatic stress disorder, unspecified: Secondary | ICD-10-CM

## 2019-05-15 NOTE — Telephone Encounter (Signed)
Theresa [name] called to say that she is not doing well at all.  Please call to discuss.  Appt 11/3

## 2019-05-16 ENCOUNTER — Telehealth: Payer: Self-pay

## 2019-05-16 MED ORDER — ALPRAZOLAM 0.5 MG PO TABS: ORAL_TABLET | ORAL | 0 refills | Status: DC

## 2019-05-16 NOTE — Telephone Encounter (Signed)
Pt. Made aware and will pick up Rx.

## 2019-05-16 NOTE — Telephone Encounter (Signed)
Prior authorization submitted and approved for Austedo 9 mg tablets through [facility] effective through 08/15/2019

## 2019-05-17 ENCOUNTER — Ambulatory Visit: Payer: Medicare Other | Admitting: Psychology

## 2019-05-17 ENCOUNTER — Ambulatory Visit (INDEPENDENT_AMBULATORY_CARE_PROVIDER_SITE_OTHER): Payer: Medicare Other | Admitting: Psychology

## 2019-05-17 DIAGNOSIS — F449 Dissociative and conversion disorder, unspecified: Secondary | ICD-10-CM | POA: Diagnosis not present

## 2019-05-17 DIAGNOSIS — F3181 Bipolar II disorder: Secondary | ICD-10-CM | POA: Diagnosis not present

## 2019-05-19 ENCOUNTER — Other Ambulatory Visit: Payer: Self-pay | Admitting: Psychiatry

## 2019-05-19 DIAGNOSIS — F5101 Primary insomnia: Secondary | ICD-10-CM

## 2019-05-21 ENCOUNTER — Other Ambulatory Visit: Payer: Self-pay | Admitting: Psychiatry

## 2019-05-21 DIAGNOSIS — F4310 Post-traumatic stress disorder, unspecified: Secondary | ICD-10-CM

## 2019-05-21 DIAGNOSIS — F3173 Bipolar disorder, in partial remission, most recent episode manic: Secondary | ICD-10-CM

## 2019-05-23 ENCOUNTER — Ambulatory Visit: Payer: Medicare Other | Admitting: Psychology

## 2019-05-24 ENCOUNTER — Ambulatory Visit (INDEPENDENT_AMBULATORY_CARE_PROVIDER_SITE_OTHER): Payer: Medicare Other | Admitting: Psychology

## 2019-05-24 DIAGNOSIS — F449 Dissociative and conversion disorder, unspecified: Secondary | ICD-10-CM | POA: Diagnosis not present

## 2019-05-24 DIAGNOSIS — F3181 Bipolar II disorder: Secondary | ICD-10-CM

## 2019-05-24 LAB — CYTOLOGY - PAP
Diagnosis: NEGATIVE
HPV 16: NEGATIVE
HPV 18 / 45: NEGATIVE
High risk HPV: POSITIVE — AB
Molecular Disclaimer: 56
Molecular Disclaimer: NEGATIVE
Molecular Disclaimer: NORMAL

## 2019-06-03 ENCOUNTER — Other Ambulatory Visit: Payer: Self-pay | Admitting: Psychiatry

## 2019-06-03 DIAGNOSIS — F319 Bipolar disorder, unspecified: Secondary | ICD-10-CM

## 2019-06-05 ENCOUNTER — Other Ambulatory Visit: Payer: Self-pay | Admitting: Psychiatry

## 2019-06-05 DIAGNOSIS — F4310 Post-traumatic stress disorder, unspecified: Secondary | ICD-10-CM

## 2019-06-05 NOTE — Telephone Encounter (Signed)
Pt requesting a refill on her Xanax. [facility] [address].

## 2019-06-06 ENCOUNTER — Other Ambulatory Visit: Payer: Self-pay | Admitting: Psychiatry

## 2019-06-06 ENCOUNTER — Ambulatory Visit: Payer: Medicare Other | Admitting: Psychology

## 2019-06-06 MED ORDER — ALPRAZOLAM 1 MG PO TABS: ORAL_TABLET | ORAL | 1 refills | Status: DC

## 2019-06-06 NOTE — Telephone Encounter (Signed)
Pt. Made aware and she says thank you.

## 2019-06-07 ENCOUNTER — Ambulatory Visit (INDEPENDENT_AMBULATORY_CARE_PROVIDER_SITE_OTHER): Payer: Medicare Other | Admitting: Psychology

## 2019-06-07 DIAGNOSIS — F3181 Bipolar II disorder: Secondary | ICD-10-CM

## 2019-06-07 NOTE — Telephone Encounter (Signed)
Is she suppose to be taking 1/2 tablet 10 mg?

## 2019-06-07 NOTE — Telephone Encounter (Signed)
Please clarify with pt if she wants me to send 5 mg tabs since she should be taking 1/2 of 10 mg.

## 2019-06-11 ENCOUNTER — Other Ambulatory Visit: Payer: Self-pay

## 2019-06-11 DIAGNOSIS — F319 Bipolar disorder, unspecified: Secondary | ICD-10-CM

## 2019-06-11 MED ORDER — OLANZAPINE 5 MG PO TABS: 5.0000 mg | ORAL_TABLET | Freq: Every day | ORAL | 5 refills | Status: DC

## 2019-06-20 ENCOUNTER — Ambulatory Visit: Payer: Medicare Other | Admitting: Psychology

## 2019-06-21 ENCOUNTER — Ambulatory Visit (INDEPENDENT_AMBULATORY_CARE_PROVIDER_SITE_OTHER): Payer: Medicare Other | Admitting: Psychology

## 2019-06-21 DIAGNOSIS — F3181 Bipolar II disorder: Secondary | ICD-10-CM

## 2019-06-26 ENCOUNTER — Encounter: Payer: Self-pay | Admitting: Psychiatry

## 2019-06-26 ENCOUNTER — Other Ambulatory Visit: Payer: Self-pay

## 2019-06-26 ENCOUNTER — Ambulatory Visit (INDEPENDENT_AMBULATORY_CARE_PROVIDER_SITE_OTHER): Payer: Medicare Other | Admitting: Psychiatry

## 2019-06-26 VITALS — BP 138/83 | HR 79

## 2019-06-26 DIAGNOSIS — F4310 Post-traumatic stress disorder, unspecified: Secondary | ICD-10-CM

## 2019-06-26 DIAGNOSIS — F319 Bipolar disorder, unspecified: Secondary | ICD-10-CM | POA: Diagnosis not present

## 2019-06-26 DIAGNOSIS — G2401 Drug induced subacute dyskinesia: Secondary | ICD-10-CM | POA: Diagnosis not present

## 2019-06-26 DIAGNOSIS — F5101 Primary insomnia: Secondary | ICD-10-CM

## 2019-06-26 MED ORDER — ALPRAZOLAM 1 MG PO TABS: ORAL_TABLET | ORAL | 1 refills | Status: DC

## 2019-06-26 MED ORDER — CLONAZEPAM 1 MG PO TABS: 1.0000 mg | ORAL_TABLET | Freq: Every day | ORAL | 0 refills | Status: DC

## 2019-06-26 NOTE — Progress Notes (Signed)
[name] JV:[id number] 03-11-1956 63 y.o.  Subjective:   Patient ID:  Theresa [name] is a 63 y.o. (DOB 05-06-1956) female.  Chief Complaint:  Chief Complaint  Patient presents with  . Follow-up    TD, Anxiety, Bipolar    HPI Theresa [name] presents to the office today for follow-up of anxiety, mood, and insomnia.   She reports "I have been a lot better... I haven't had suicidal thoughts." She reports that SI was situational. She reports that Xanax seems to be more effective for her anxiety and is now taking it only prn and some days not needing it at all and taking no more than twice daily. She reports that she was not sleeping as well with Xanax and then resumed Klonopin QHS. Reports that she is taking Klonopin only at night. Going to bed around 11 pm and sleeping until 7-8 am. She reports that her mood has been stable. They report that she had some manic s/s in mid-September. Denies current depressed mood. Husband reports that recently her mood has been "nice and predictable." She reports that her appetite has been good. Energy and motivation have been fair. She has been doing some painting and cleaning. Husband agrees that she has been more active. He notices that she is writing better and doing more art work. Concentration has been adequate.   Reports that her hand tremor has improved with Austedo and stopped Ingrezza about 7 weeks ago. Reports that she is able to sit still at times now. Decreased involuntary movements in lower extremities. Continues to notice some mild TD in ankles and toes.   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  Review of Systems:  Review of Systems They report seizure-like activity on 06/11/19. She reports that she had GI illness with diarrhea, decreased appetite, and decreased sleep. She and her husband report seizure activity was milder and shorter duration compared to past seizure activity. They report that she recovered  quicker than usual and did not have lingering cognitive effects.   Medications: I have reviewed the patient's current medications.  Current Outpatient Medications  Medication Sig Dispense Refill  . ALPRAZolam (XANAX) 1 MG tablet Take 1 tab po BID prn anxiety 60 tablet 1  . bisacodyl (DULCOLAX) 5 MG EC tablet Take 5 mg by mouth daily as needed for moderate constipation.    . carbamazepine (CARBATROL) 300 MG 12 hr capsule Take 2 capsules (600 mg total) by mouth at bedtime. 90 capsule 0  . Deutetrabenazine (AUSTEDO PO) Take 18 mg by mouth 2 (two) times daily.    Marland Kitchen docusate sodium (COLACE) 100 MG capsule Take 100 mg by mouth daily as needed for mild constipation.    . levETIRAcetam (KEPPRA) 500 MG tablet Take 1 tablet (500 mg total) by mouth 2 (two) times daily. 180 tablet 3  . lovastatin (MEVACOR) 20 MG tablet TAKE 1 TABLET BY MOUTH EVERY DAY 90 tablet 1  . Melatonin 10 MG TABS Take 1 tablet by mouth as needed. Reported on 01/30/2016     . OLANZapine (ZYPREXA) 5 MG tablet Take 1 tablet (5 mg total) by mouth at bedtime. 30 tablet 5  . traZODone (DESYREL) 100 MG tablet TAKE 1 TO 2 TABLETS BY MOUTH AT BEDTIME AS NEEDED FOR SLEEP 60 tablet 2  . zolpidem (AMBIEN) 10 MG tablet TAKE 1 TABLET BY MOUTH AT BEDTIME AS NEEDED FOR UP TO 30 DAYS 30 tablet 5  . clonazePAM (KLONOPIN) 1 MG tablet  Take 1 tablet (1 mg total) by mouth at bedtime. 30 tablet 0  . Multiple Vitamin (MULTIVITAMIN) tablet Take 1 tablet by mouth daily.     No current facility-administered medications for this visit.     Medication Side Effects: None  Allergies:  Allergies  Allergen Reactions  . Lamictal [Lamotrigine]     Acute renal failure  . Ropinirole Nausea And Vomiting  . Codeine Swelling and Rash    Can take hydrocodone  . Penicillins     Abdominal pain    Past Medical History:  Diagnosis Date  . Adenomatous colon polyp   . Allergy   . Anxiety   . Bipolar 1 disorder ([place])   . Blood transfusion without reported  diagnosis   . Depression   . Elevated LFTs   . Hepatic steatosis 05/07/13  . Hyperlipidemia   . Seizures ([place])   . Tardive dyskinesia     Family History  Problem Relation Age of Onset  . Colon polyps Mother   . Cancer Mother   . Heart disease Mother   . Alcoholism Mother   . Cancer Father   . Alcoholism Father   . Bipolar disorder Father   . Drug abuse Brother   . Bipolar disorder Other   . Colon cancer Neg Hx   . Stomach cancer Neg Hx   . Breast cancer Neg Hx     Social History   Socioeconomic History  . Marital status: Married    Spouse name: Not on file  . Number of children: Not on file  . Years of education: Not on file  . Highest education level: Not on file  Occupational History  . Not on file  Social Needs  . Financial resource strain: Not on file  . Food insecurity    Worry: Not on file    Inability: Not on file  . Transportation needs    Medical: Not on file    Non-medical: Not on file  Tobacco Use  . Smoking status: Never Smoker  . Smokeless tobacco: Never Used  Substance and Sexual Activity  . Alcohol use: Yes    Comment: occasionaly 1 a month  . Drug use: No  . Sexual activity: Not Currently  Lifestyle  . Physical activity    Days per week: Not on file    Minutes per session: Not on file  . Stress: Not on file  Relationships  . Social Herbalist on phone: Not on file    Gets together: Not on file    Attends religious service: Not on file    Active member of club or organization: Not on file    Attends meetings of clubs or organizations: Not on file    Relationship status: Not on file  . Intimate partner violence    Fear of current or ex partner: Not on file    Emotionally abused: Not on file    Physically abused: Not on file    Forced sexual activity: Not on file  Other Topics Concern  . 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   Please see review of systems for further details on the patient's review from today.  Objective:   Physical Exam:  BP 138/83   Pulse 79   Physical Exam Constitutional:      General: She is not in acute distress.    Appearance: She is well-developed.  Musculoskeletal:        General: No deformity.  Neurological:     Mental Status: She is alert and oriented to person, place, and time.     Coordination: Coordination normal.  Psychiatric:        Attention and Perception: Attention and perception normal. She does not perceive auditory or visual hallucinations.        Mood and Affect: Mood normal. Mood is not anxious or depressed. Affect is not labile, blunt, angry or inappropriate.        Speech: Speech normal.        Behavior: Behavior normal.        Thought Content: Thought content normal. Thought content does not include homicidal or suicidal ideation. Thought content does not include homicidal or suicidal plan.        Cognition and Memory: Cognition and memory normal.        Judgment: Judgment normal.     Comments: Insight intact. No delusions.      Lab Review:     Component Value Date/Time   NA 142 02/01/2019 1427   K 4.7 02/01/2019 1427   CL 105 02/01/2019 1427   CO2 28 02/01/2019 1427   GLUCOSE 94 02/01/2019 1427   BUN 13 02/01/2019 1427   CREATININE 0.96 02/01/2019 1427   CREATININE 0.91 09/19/2015 0959   CALCIUM 9.8 02/01/2019 1427   PROT 6.5 02/01/2019 1427   ALBUMIN 4.5 02/01/2019 1427   AST 31 02/01/2019 1427   ALT 54 (H) 02/01/2019 1427   ALKPHOS 128 (H) 02/01/2019 1427   BILITOT 0.4 02/01/2019 1427       Component Value Date/Time   WBC 5.4 02/01/2019 1427   RBC 5.17 (H) 02/01/2019 1427   HGB 15.2 (H) 02/01/2019 1427   HCT 45.1 02/01/2019 1427   PLT 195.0 02/01/2019 1427   MCV 87.3 02/01/2019 1427    MCV 87.7 02/07/2013 1110   MCH 28.3 03/10/2015 0957   MCHC 33.8 02/01/2019 1427   RDW 13.3 02/01/2019 1427    No results found for: POCLITH, LITHIUM   Lab Results  Component Value Date   CBMZ 5.2 02/01/2019     .res Assessment: Plan:   Will continue current plan of care since pt and her husband report that her mood and anxiety s/s have been stable with current medications. Improvement in TD noted and reported with change from [place] to [place]. Advised pt to contact neurologist if further seizure activity occurs. Pt to f/u in 3 months or sooner if clinically indicated. Patient advised to contact office with any questions, adverse effects, or acute worsening in signs and symptoms.  Theresa [name] was seen today for follow-up.  Diagnoses and all orders for this visit:  Bipolar I disorder ([place])  Posttraumatic stress disorder -     ALPRAZolam (XANAX) 1 MG tablet; Take 1 tab po BID prn anxiety  Primary insomnia -     clonazePAM (KLONOPIN) 1 MG tablet; Take 1 tablet (1 mg total) by mouth at bedtime.  Tardive dyskinesia     Please see After Visit Summary for patient specific instructions.  Future Appointments  Date Time Provider [place]  07/02/2019  1:00 PM Doree Fudge, PhD [facility] None  07/16/2019  2:00 PM Doree Fudge, PhD [facility] None  08/02/2019 12:30 PM Doree Fudge, PhD [facility] None  08/16/2019 12:30 PM Doree Fudge, PhD [facility] None  09/28/2019  3:30 PM Cameron Sprang, MD [facility] None  10/02/2019  1:30 PM [name], PMHNP [facility] None    No orders of the defined types were placed in this encounter.   -------------------------------

## 2019-06-26 NOTE — Progress Notes (Signed)
   06/26/19 1325  Facial and Oral Movements  Muscles of Facial Expression 0  Lips and Perioral Area 0  Jaw 1  Tongue 1  Extremity Movements  Upper (arms, wrists, hands, fingers) 0  Lower (legs, knees, ankles, toes) 1  Trunk Movements  Neck, shoulders, hips 0  Overall Severity  Severity of abnormal movements (highest score from questions above) 1  Incapacitation due to abnormal movements 3  Patient's awareness of abnormal movements (rate only patient's report) 2  AIMS Total Score  AIMS Total Score 9

## 2019-06-29 ENCOUNTER — Telehealth: Payer: Self-pay

## 2019-06-29 ENCOUNTER — Telehealth: Payer: Self-pay | Admitting: Psychiatry

## 2019-06-29 ENCOUNTER — Other Ambulatory Visit: Payer: Self-pay

## 2019-06-29 ENCOUNTER — Telehealth: Payer: Self-pay | Admitting: Neurology

## 2019-06-29 DIAGNOSIS — F4310 Post-traumatic stress disorder, unspecified: Secondary | ICD-10-CM

## 2019-06-29 DIAGNOSIS — F319 Bipolar disorder, unspecified: Secondary | ICD-10-CM

## 2019-06-29 DIAGNOSIS — F5101 Primary insomnia: Secondary | ICD-10-CM

## 2019-06-29 MED ORDER — OLANZAPINE 5 MG PO TABS: 5.0000 mg | ORAL_TABLET | Freq: Every day | ORAL | 1 refills | Status: DC

## 2019-06-29 MED ORDER — LEVETIRACETAM 500 MG PO TABS: 500.0000 mg | ORAL_TABLET | Freq: Two times a day (BID) | ORAL | 3 refills | Status: DC

## 2019-06-29 MED ORDER — CARBAMAZEPINE ER 300 MG PO CP12: 600.0000 mg | ORAL_CAPSULE | Freq: Every day | ORAL | 1 refills | Status: DC

## 2019-06-29 MED ORDER — CLONAZEPAM 1 MG PO TABS: 1.0000 mg | ORAL_TABLET | Freq: Every day | ORAL | 1 refills | Status: DC

## 2019-06-29 MED ORDER — ALPRAZOLAM 1 MG PO TABS: ORAL_TABLET | ORAL | 1 refills | Status: DC

## 2019-06-29 MED ORDER — ZOLPIDEM TARTRATE 10 MG PO TABS: ORAL_TABLET | ORAL | 5 refills | Status: DC

## 2019-06-29 MED ORDER — TRAZODONE HCL 100 MG PO TABS: ORAL_TABLET | ORAL | 1 refills | Status: DC

## 2019-06-29 NOTE — Telephone Encounter (Signed)
Noted. Keppra 90 day supply with 3 refills sent to [facility] today.

## 2019-06-29 NOTE — Telephone Encounter (Signed)
Patient left msg with after hours about her insurance from a medicare plan to a mail order plan. The prescription company is [facility]. Just FYI. Thanks!

## 2019-06-29 NOTE — Telephone Encounter (Signed)
Refills all pended for approval for [facility]

## 2019-06-29 NOTE — Telephone Encounter (Signed)
Sent patent myhcart message for clarification.

## 2019-06-29 NOTE — Telephone Encounter (Signed)
Copied from [place] [id number]. Topic: General - Other >> Jun 29, 2019 12:31 PM Erick Blinks wrote: Pt is changing to [facility] with [facility], this is their Doctor's only line. I believe pt is requesting for PCP to provide PA for prescriptions  [phone]

## 2019-06-29 NOTE — Telephone Encounter (Signed)
Pt called to report insurance changed and  Med refills will need to go to [facility] eff now per pt. [facility] # [id number]

## 2019-06-29 NOTE — Telephone Encounter (Signed)
Patient now using [facility] per insurance

## 2019-07-02 ENCOUNTER — Ambulatory Visit (INDEPENDENT_AMBULATORY_CARE_PROVIDER_SITE_OTHER): Payer: Medicare Other | Admitting: Psychology

## 2019-07-02 DIAGNOSIS — F3181 Bipolar II disorder: Secondary | ICD-10-CM | POA: Diagnosis not present

## 2019-07-02 DIAGNOSIS — F449 Dissociative and conversion disorder, unspecified: Secondary | ICD-10-CM

## 2019-07-03 ENCOUNTER — Other Ambulatory Visit: Payer: Self-pay

## 2019-07-03 MED ORDER — LOVASTATIN 20 MG PO TABS: 20.0000 mg | ORAL_TABLET | Freq: Every day | ORAL | 1 refills | Status: DC

## 2019-07-04 ENCOUNTER — Ambulatory Visit: Payer: Medicare Other | Admitting: Psychology

## 2019-07-05 ENCOUNTER — Ambulatory Visit: Payer: Medicare Other | Admitting: Psychology

## 2019-07-16 ENCOUNTER — Ambulatory Visit (INDEPENDENT_AMBULATORY_CARE_PROVIDER_SITE_OTHER): Payer: Medicare Other | Admitting: Psychology

## 2019-07-16 DIAGNOSIS — F3181 Bipolar II disorder: Secondary | ICD-10-CM | POA: Diagnosis not present

## 2019-07-16 DIAGNOSIS — F449 Dissociative and conversion disorder, unspecified: Secondary | ICD-10-CM | POA: Diagnosis not present

## 2019-07-18 ENCOUNTER — Telehealth: Payer: Self-pay

## 2019-07-18 ENCOUNTER — Ambulatory Visit: Payer: Medicare Other | Admitting: Psychology

## 2019-07-18 NOTE — Telephone Encounter (Signed)
Renewal prior authorization submitted and approved for Austedo 9 mg tablets, 2 tablets bid effective 07/18/2019-08/22/2020 with [facility].

## 2019-07-27 DIAGNOSIS — M542 Cervicalgia: Secondary | ICD-10-CM | POA: Diagnosis not present

## 2019-07-30 ENCOUNTER — Ambulatory Visit (INDEPENDENT_AMBULATORY_CARE_PROVIDER_SITE_OTHER): Payer: Medicare Other | Admitting: Psychology

## 2019-07-30 DIAGNOSIS — F3181 Bipolar II disorder: Secondary | ICD-10-CM | POA: Diagnosis not present

## 2019-07-30 DIAGNOSIS — F449 Dissociative and conversion disorder, unspecified: Secondary | ICD-10-CM

## 2019-08-01 ENCOUNTER — Ambulatory Visit: Payer: Medicare Other | Admitting: Psychology

## 2019-08-02 ENCOUNTER — Ambulatory Visit: Payer: Medicare Other | Admitting: Psychology

## 2019-08-09 DIAGNOSIS — M542 Cervicalgia: Secondary | ICD-10-CM | POA: Diagnosis not present

## 2019-08-11 ENCOUNTER — Other Ambulatory Visit: Payer: Self-pay | Admitting: Psychiatry

## 2019-08-11 DIAGNOSIS — F319 Bipolar disorder, unspecified: Secondary | ICD-10-CM

## 2019-08-12 NOTE — Telephone Encounter (Signed)
Submitted to [employer]

## 2019-08-13 DIAGNOSIS — M542 Cervicalgia: Secondary | ICD-10-CM | POA: Diagnosis not present

## 2019-08-15 ENCOUNTER — Ambulatory Visit: Payer: Medicare Other | Admitting: Psychology

## 2019-08-15 DIAGNOSIS — M542 Cervicalgia: Secondary | ICD-10-CM | POA: Diagnosis not present

## 2019-08-16 ENCOUNTER — Ambulatory Visit: Payer: Medicare Other | Admitting: Psychology

## 2019-08-20 DIAGNOSIS — M542 Cervicalgia: Secondary | ICD-10-CM | POA: Diagnosis not present

## 2019-08-22 DIAGNOSIS — M542 Cervicalgia: Secondary | ICD-10-CM | POA: Diagnosis not present

## 2019-08-27 ENCOUNTER — Ambulatory Visit (INDEPENDENT_AMBULATORY_CARE_PROVIDER_SITE_OTHER): Payer: Medicare Other | Admitting: Psychology

## 2019-08-27 DIAGNOSIS — F449 Dissociative and conversion disorder, unspecified: Secondary | ICD-10-CM | POA: Diagnosis not present

## 2019-08-27 DIAGNOSIS — M542 Cervicalgia: Secondary | ICD-10-CM | POA: Diagnosis not present

## 2019-08-27 DIAGNOSIS — F3181 Bipolar II disorder: Secondary | ICD-10-CM | POA: Diagnosis not present

## 2019-08-28 ENCOUNTER — Telehealth: Payer: Self-pay | Admitting: Psychiatry

## 2019-08-28 NOTE — Telephone Encounter (Signed)
Theresa [name] called and left a message that her therapist, Pervis Hocking, wants to speak with you about her care. That is all she said. She is scheduled later this month.

## 2019-08-29 DIAGNOSIS — M542 Cervicalgia: Secondary | ICD-10-CM | POA: Diagnosis not present

## 2019-09-03 DIAGNOSIS — M542 Cervicalgia: Secondary | ICD-10-CM | POA: Diagnosis not present

## 2019-09-06 DIAGNOSIS — M5412 Radiculopathy, cervical region: Secondary | ICD-10-CM | POA: Diagnosis not present

## 2019-09-10 DIAGNOSIS — M542 Cervicalgia: Secondary | ICD-10-CM | POA: Diagnosis not present

## 2019-09-13 ENCOUNTER — Other Ambulatory Visit: Payer: Self-pay | Admitting: Family Medicine

## 2019-09-13 ENCOUNTER — Other Ambulatory Visit: Payer: Self-pay | Admitting: Psychiatry

## 2019-09-13 DIAGNOSIS — F319 Bipolar disorder, unspecified: Secondary | ICD-10-CM

## 2019-09-13 DIAGNOSIS — F4310 Post-traumatic stress disorder, unspecified: Secondary | ICD-10-CM

## 2019-09-13 DIAGNOSIS — F5101 Primary insomnia: Secondary | ICD-10-CM

## 2019-09-13 NOTE — Telephone Encounter (Signed)
Apt 02/09, requesting year supply

## 2019-09-24 ENCOUNTER — Ambulatory Visit (INDEPENDENT_AMBULATORY_CARE_PROVIDER_SITE_OTHER): Payer: Medicare Other | Admitting: Psychology

## 2019-09-24 DIAGNOSIS — F3181 Bipolar II disorder: Secondary | ICD-10-CM | POA: Diagnosis not present

## 2019-09-24 DIAGNOSIS — F449 Dissociative and conversion disorder, unspecified: Secondary | ICD-10-CM | POA: Diagnosis not present

## 2019-09-28 ENCOUNTER — Telehealth (INDEPENDENT_AMBULATORY_CARE_PROVIDER_SITE_OTHER): Payer: Medicare Other | Admitting: Neurology

## 2019-09-28 ENCOUNTER — Other Ambulatory Visit: Payer: Self-pay

## 2019-09-28 ENCOUNTER — Encounter: Payer: Self-pay | Admitting: Neurology

## 2019-09-28 DIAGNOSIS — G40309 Generalized idiopathic epilepsy and epileptic syndromes, not intractable, without status epilepticus: Secondary | ICD-10-CM

## 2019-09-28 MED ORDER — LEVETIRACETAM 500 MG PO TABS: 500.0000 mg | ORAL_TABLET | Freq: Two times a day (BID) | ORAL | 3 refills | Status: DC

## 2019-09-28 MED ORDER — CARBAMAZEPINE ER 300 MG PO CP12: 600.0000 mg | ORAL_CAPSULE | Freq: Every day | ORAL | 3 refills | Status: DC
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
## 2019-10-02 ENCOUNTER — Ambulatory Visit: Payer: Medicare Other | Admitting: Psychiatry

## 2019-10-16 ENCOUNTER — Ambulatory Visit (INDEPENDENT_AMBULATORY_CARE_PROVIDER_SITE_OTHER): Payer: Medicare Other | Admitting: Psychiatry

## 2019-10-16 ENCOUNTER — Encounter: Payer: Self-pay | Admitting: Psychiatry

## 2019-10-16 VITALS — Wt 157.0 lb

## 2019-10-16 DIAGNOSIS — F4310 Post-traumatic stress disorder, unspecified: Secondary | ICD-10-CM | POA: Diagnosis not present

## 2019-10-16 DIAGNOSIS — F319 Bipolar disorder, unspecified: Secondary | ICD-10-CM | POA: Diagnosis not present

## 2019-10-16 DIAGNOSIS — F5101 Primary insomnia: Secondary | ICD-10-CM

## 2019-10-16 MED ORDER — OLANZAPINE 5 MG PO TABS: ORAL_TABLET | ORAL | 3 refills | Status: DC

## 2019-10-16 NOTE — Progress Notes (Signed)
Theresa [name] [id number] 1956-02-25 64 y.o.  Virtual Visit via Telephone Note  I connected with pt on 10/16/19 at 10:00 AM EST by telephone and verified that I am speaking with the correct person using two identifiers.   I discussed the limitations, risks, security and privacy concerns of performing an evaluation and management service by telephone and the availability of in person appointments. I also discussed with the patient that there may be a patient responsible charge related to this service. The patient expressed understanding and agreed to proceed.   I discussed the assessment and treatment plan with the patient. The patient was provided an opportunity to ask questions and all were answered. The patient agreed with the plan and demonstrated an understanding of the instructions.   The patient was advised to call back or seek an in-person evaluation if the symptoms worsen or if the condition fails to improve as anticipated.  I provided 25 minutes of non-face-to-face time during this encounter.  The patient was located at home.  The provider was located at [place].   [name], PMHNP   Subjective:   Patient ID:  Theresa [name] is a 64 y.o. (DOB 08-Aug-1956) female.  Chief Complaint:  Chief Complaint  Patient presents with  . Follow-up    h/o Bipolar D/O, Anxiety, and Insomnia    HPI Theresa [name] presents for follow-up of mood, anxiety, and insomnia. She reports that she has been doing well overall. Denies any manic s/s. Denies any significant depression. She reports that she had one day of depression 3 weeks ago and this has not recurred. Anxiety has been slightly higher than normal in response to world events. She reports that most nights she sleeps well and about twice a week it will take longer to get to sleep. Appetite has been ok. Energy has been good. Motivation is slightly low due to TD interfering with activities. Concentration has been ok. Denies  risky or impulsive behavior. Denies SI or self-injurious ideation.   Reports that she tried to decrease Trazodone to 100 mg po QHS and then experienced middle of the night awakenings.   She reports taking Xanax prn only when anxiety is elevated and not everyday. Reports that Xanax has been effective for acute anxiety.   Reports that Austedo has been somewhat helpful for TD, especially for foot movements. She reports that she continues to have significant tongue movements.  Has been able to paint and occ feels anxious before painting.   Past Psychiatric Medication Trials: Olanzapine Seroquel Saphris Depakote-self-injurious behavior Lamictal Keppra Carbamazepine Xanax Klonopin Ambien Trazodone Benztropine Ingrezza Propranolol- Ineffective  Review of Systems:  Review of Systems  Musculoskeletal: Negative for gait problem.  Neurological: Positive for tremors.       Unsure if she had a nocturnal seizure recently since she had bitten her tongue in her sleep.  Psychiatric/Behavioral:       Please refer to HPI    Medications: I have reviewed the patient's current medications.  Current Outpatient Medications  Medication Sig Dispense Refill  . ALPRAZolam (XANAX) 1 MG tablet TAKE 1 TABLET BY MOUTH  TWICE DAILY AS NEEDED FOR  ANXIETY 60 tablet 2  . carbamazepine (CARBATROL) 300 MG 12 hr capsule Take 2 capsules (600 mg total) by mouth at bedtime. 180 capsule 3  . clonazePAM (KLONOPIN) 1 MG tablet TAKE 1 TABLET BY MOUTH AT  BEDTIME 30 tablet 2  . Deutetrabenazine (AUSTEDO PO) Take 18 mg by mouth 2 (two) times daily.    Marland Kitchen  docusate sodium (COLACE) 100 MG capsule Take 100 mg by mouth daily as needed for mild constipation.    . levETIRAcetam (KEPPRA) 500 MG tablet Take 1 tablet (500 mg total) by mouth 2 (two) times daily. 180 tablet 3  . lovastatin (MEVACOR) 20 MG tablet TAKE 1 TABLET BY MOUTH  DAILY 90 tablet 3  . Melatonin 10 MG TABS Take 1 tablet by mouth as needed. Reported on  01/30/2016     . Multiple Vitamin (MULTIVITAMIN) tablet Take 1 tablet by mouth daily.    Marland Kitchen OLANZapine (ZYPREXA) 5 MG tablet Take 1/2 tab po QHS for one week, then stop 90 tablet 3  . traZODone (DESYREL) 100 MG tablet TAKE 1 TO 2 TABLETS BY MOUTH AT BEDTIME AS NEEDED FOR SLEEP 180 tablet 1  . zolpidem (AMBIEN) 10 MG tablet TAKE 1 TABLET BY MOUTH AT BEDTIME AS NEEDED FOR UP TO 30 DAYS 30 tablet 5  . bisacodyl (DULCOLAX) 5 MG EC tablet Take 5 mg by mouth daily as needed for moderate constipation.     No current facility-administered medications for this visit.    Medication Side Effects: Other: Constipation with Olanzapine. TD.  Allergies:  Allergies  Allergen Reactions  . Lamictal [Lamotrigine]     Acute renal failure  . Ropinirole Nausea And Vomiting  . Codeine Swelling and Rash    Can take hydrocodone  . Penicillins     Abdominal pain    Past Medical History:  Diagnosis Date  . Adenomatous colon polyp   . Allergy   . Anxiety   . Bipolar 1 disorder ([place])   . Blood transfusion without reported diagnosis   . Depression   . Elevated LFTs   . Hepatic steatosis 05/07/13  . Hyperlipidemia   . Seizures ([place])   . Tardive dyskinesia     Family History  Problem Relation Age of Onset  . Colon polyps Mother   . Cancer Mother   . Heart disease Mother   . Alcoholism Mother   . Cancer Father   . Alcoholism Father   . Bipolar disorder Father   . Drug abuse Brother   . Bipolar disorder Other   . Colon cancer Neg Hx   . Stomach cancer Neg Hx   . Breast cancer Neg Hx     Social History   Socioeconomic History  . Marital status: Married    Spouse name: Not on file  . Number of children: Not on file  . Years of education: Not on file  . Highest education level: Not on file  Occupational History  . Not on file  Tobacco Use  . Smoking status: Never Smoker  . Smokeless tobacco: Never Used  Substance and Sexual Activity  . Alcohol use: Yes    Comment: occasionaly 1 a month   . Drug use: No  . Sexual activity: Not Currently  Other Topics Concern  . 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 Difficulty of Paying Living Expenses: Not on file  Food Insecurity:   . Worried About Charity fundraiser in the Last Year: Not on file  . Ran Out of Food in the Last Year: Not on file  Transportation Needs:   . Lack of Transportation (Medical): Not  on file  . Lack of Transportation (Non-Medical): Not on file  Physical Activity:   . Days of Exercise per Week: Not on file  . Minutes of Exercise per Session: Not on file  Stress:   . Feeling of Stress : Not on file  Social Connections:   . Frequency of Communication with Friends and Family: Not on file  . Frequency of Social Gatherings with Friends and Family: Not on file  . Attends Religious Services: Not on file  . Active Member of Clubs or Organizations: Not on file  . Attends Archivist Meetings: Not on file  . Marital Status: Not on file  Intimate Partner Violence:   . Fear of Current or Ex-Partner: Not on file  . Emotionally Abused: Not on file  . Physically Abused: Not on file  . Sexually Abused: Not on file    Past Medical History, Surgical history, Social history, and Family history were reviewed and updated as appropriate.   Please see review of systems for further details on the patient's review from today.   Objective:   Physical Exam:  Wt 157 lb (71.2 kg)   BMI 25.34 kg/m   Physical Exam Neurological:     Mental Status: She is alert and oriented to person, place, and time.     Cranial Nerves: No dysarthria.  Psychiatric:        Attention and Perception: Attention and perception normal.        Mood and Affect: Mood normal.        Speech: Speech normal.        Behavior: Behavior is cooperative.         Thought Content: Thought content normal. Thought content is not paranoid or delusional. Thought content does not include homicidal or suicidal ideation. Thought content does not include homicidal or suicidal plan.        Cognition and Memory: Cognition and memory normal.        Judgment: Judgment normal.     Comments: Insight intact     Lab Review:     Component Value Date/Time   NA 142 02/01/2019 1427   K 4.7 02/01/2019 1427   CL 105 02/01/2019 1427   CO2 28 02/01/2019 1427   GLUCOSE 94 02/01/2019 1427   BUN 13 02/01/2019 1427   CREATININE 0.96 02/01/2019 1427   CREATININE 0.91 09/19/2015 0959   CALCIUM 9.8 02/01/2019 1427   PROT 6.5 02/01/2019 1427   ALBUMIN 4.5 02/01/2019 1427   AST 31 02/01/2019 1427   ALT 54 (H) 02/01/2019 1427   ALKPHOS 128 (H) 02/01/2019 1427   BILITOT 0.4 02/01/2019 1427       Component Value Date/Time   WBC 5.4 02/01/2019 1427   RBC 5.17 (H) 02/01/2019 1427   HGB 15.2 (H) 02/01/2019 1427   HCT 45.1 02/01/2019 1427   PLT 195.0 02/01/2019 1427   MCV 87.3 02/01/2019 1427   MCV 87.7 02/07/2013 1110   MCH 28.3 03/10/2015 0957   MCHC 33.8 02/01/2019 1427   RDW 13.3 02/01/2019 1427    No results found for: POCLITH, LITHIUM   Lab Results  Component Value Date   CBMZ 5.2 02/01/2019     .res Assessment: Plan:   Discussed patient's question and request about possible dose reduction with medications.  Discussed that mood signs and symptoms seem to have been more stable when she is taking higher doses of carbamazepine consistently, and therefore she may no longer require olanzapine.  Discussed decreasing olanzapine  to half tab p.o. nightly for 1 week, then discontinuing.  Patient advised to monitor for any worsening anxiety, insomnia, or mood lability. Will continue all other medications as prescribed. Continue carbamazepine 600 mg at bedtime for mood stabilization Continue Austedo 18 mg twice daily for tardive dyskinesia. Continue trazodone  for insomnia Continue Ambien for insomnia. Continue Klonopin 1 mg p.o. nightly for insomnia and anxiety. Continue Xanax as needed for anxiety. Patient to follow-up with this provider in 4 to 6 weeks or sooner if clinically indicated. Patient advised to contact office with any questions, adverse effects, or acute worsening in signs and symptoms.  Jasamine was seen today for follow-up.  Diagnoses and all orders for this visit:  Posttraumatic stress disorder  Bipolar depression ([place]) -     OLANZapine (ZYPREXA) 5 MG tablet; Take 1/2 tab po QHS for one week, then stop  Primary insomnia    Please see After Visit Summary for patient specific instructions.  Future Appointments  Date Time Provider [place]  10/22/2019  1:00 PM Doree Fudge, PhD [facility] None  11/13/2019 11:00 AM [name], PMHNP [facility] None  11/21/2019  3:00 PM Doree Fudge, PhD [facility] None  12/19/2019  3:00 PM Doree Fudge, PhD [facility] None  01/16/2020  3:00 PM Doree Fudge, PhD [facility] None  02/13/2020  3:00 PM Doree Fudge, PhD [facility] None  03/12/2020  3:00 PM Doree Fudge, PhD [facility] None  04/09/2020  3:00 PM Doree Fudge, PhD [facility] None  05/07/2020  3:00 PM Doree Fudge, PhD [facility] None  06/04/2020  3:00 PM Doree Fudge, PhD [facility] None  07/02/2020  3:00 PM Doree Fudge, PhD [facility] None  07/30/2020  3:00 PM Doree Fudge, PhD [facility] None  08/27/2020  3:00 PM Doree Fudge, PhD [facility] None  09/24/2020  3:00 PM Doree Fudge, PhD [facility] None  09/29/2020  3:00 PM Cameron Sprang, MD [facility] None  10/22/2020  3:00 PM Doree Fudge, PhD [facility] None  11/19/2020  3:00 PM Doree Fudge, PhD [facility] None  12/17/2020  3:00 PM Doree Fudge, PhD [facility] None    No orders of the defined types were placed in this encounter.     -------------------------------

## 2019-10-18 ENCOUNTER — Other Ambulatory Visit: Payer: Self-pay | Admitting: Psychiatry

## 2019-10-18 DIAGNOSIS — F5101 Primary insomnia: Secondary | ICD-10-CM

## 2019-10-22 ENCOUNTER — Ambulatory Visit (INDEPENDENT_AMBULATORY_CARE_PROVIDER_SITE_OTHER): Payer: Medicare Other | Admitting: Psychology

## 2019-10-22 DIAGNOSIS — F449 Dissociative and conversion disorder, unspecified: Secondary | ICD-10-CM

## 2019-10-22 DIAGNOSIS — F3181 Bipolar II disorder: Secondary | ICD-10-CM

## 2019-11-02 ENCOUNTER — Other Ambulatory Visit: Payer: Self-pay

## 2019-11-02 MED ORDER — AUSTEDO 9 MG PO TABS: 18.0000 mg | ORAL_TABLET | Freq: Two times a day (BID) | ORAL | 5 refills | Status: DC

## 2019-11-08 ENCOUNTER — Encounter: Payer: Self-pay | Admitting: Family Medicine

## 2019-11-08 ENCOUNTER — Telehealth: Payer: Self-pay | Admitting: Radiology

## 2019-11-08 NOTE — Telephone Encounter (Signed)
Exercise treadmill test was ordered back in June 2020 when treadmill room was shut down due to covid. Please advise if patient still needs the test scheduled. Currently patients are being asked to have a covid screening 4 days prior and will be required to self quarantine until test.  If no longer needed can you please cancel the order.

## 2019-11-13 ENCOUNTER — Other Ambulatory Visit: Payer: Self-pay

## 2019-11-13 ENCOUNTER — Encounter: Payer: Self-pay | Admitting: Psychiatry

## 2019-11-13 ENCOUNTER — Ambulatory Visit (INDEPENDENT_AMBULATORY_CARE_PROVIDER_SITE_OTHER): Payer: Medicare Other | Admitting: Psychiatry

## 2019-11-13 DIAGNOSIS — F5101 Primary insomnia: Secondary | ICD-10-CM

## 2019-11-13 DIAGNOSIS — F319 Bipolar disorder, unspecified: Secondary | ICD-10-CM

## 2019-11-13 DIAGNOSIS — F4310 Post-traumatic stress disorder, unspecified: Secondary | ICD-10-CM | POA: Diagnosis not present

## 2019-11-13 MED ORDER — OLANZAPINE 5 MG PO TABS: 5.0000 mg | ORAL_TABLET | Freq: Every day | ORAL | 3 refills | Status: DC

## 2019-11-13 MED ORDER — ALPRAZOLAM 1 MG PO TABS: ORAL_TABLET | ORAL | 2 refills | Status: DC

## 2019-11-13 MED ORDER — CLONAZEPAM 1 MG PO TABS: 1.0000 mg | ORAL_TABLET | Freq: Every day | ORAL | 2 refills | Status: DC

## 2019-11-13 NOTE — Progress Notes (Signed)
Theresa [name] [id number] [birth year] 64 y.o.  Subjective:   Patient ID:  Theresa [name] is a 64 y.o. (DOB 1956-08-21) female.  Chief Complaint:  Chief Complaint  Patient presents with  . Follow-up    Recent manic s/s and insomnia    HPI Theresa [name] presents to the office today for follow-up of mood disturbance or anxiety.  She reports that she had difficulty sleeping with stopping Olanzapine and was awakening between 1 am-4 am with racing thoughts and unable to return to sleep. She resumed Olanzapine on Friday and sleep has returned to normal.Now averaging about 8 hours of sleep uninterrupted. Does not nap. Denies any other manic s/s with stopping Olanzapine. She reports that her mood has been "good" and denies depressed mood. Reports some anxiety and nervousness. Denies any panic attacks. She reports that her energy and motivation have been lower with some allergy s/s. Has not done artwork recently but is planning to do so now that she is feeling better. Appetite has improved with allergy s/s improving. Concentration has been ok. Denies any current racing thoughts. Denies impulsive or risky behaviors. Denies increased talkativeness. Denies SI.   Husband also noted mania when she was off Olanzapine. He reports that mania and sleep improved after a couple of days of taking Olanzapine. He reports that she continues to have some urgency about certain things that has been indicative of mania in the past. He reports that she continues to have some mania and that it seems to be improving. Husband reports that in the past her mood would cycle weekly.   She reports that she continues to notice TD in her tongue and her foot. Has difficulty sitting still. Denies any change in TD recently.   Taking Xanax prn once on most days and on rare occ will take 2 tabs in one day. She reports that she takes Xanax prn before social interaction since she has social anxiety. She reports that she typically does not  experience any relief with 0.5 mg of Xanax.   Joined an art group to get her art work out and reports that she thoughts about this for awhile.   Past Psychiatric Medication Trials: Olanzapine Seroquel Saphris Depakote-self-injurious behavior Lamictal Keppra Carbamazepine Xanax Klonopin Ambien Trazodone Benztropine Ingrezza Propranolol- Ineffective  AIMS     Office Visit from 06/26/2019 in [place] Visit from 04/26/2019 in [place] Total Score  9  18    Mini-Mental     Office Visit from 02/28/2017 in [facility] at [employer] [facility]  Total Score (max 30 points )  29    PHQ2-9     Office Visit from 02/28/2017 in [facility] [place] Visit from 09/19/2015 in Primary Care at [place] from 06/05/2015 in Primary Care at [place] from 03/10/2015 in Primary Care at [facility] Total Score  0  0  0  0       Review of Systems:  Review of Systems  HENT: Positive for congestion and sore throat.   Musculoskeletal: Positive for neck pain. Negative for gait problem.       Improved neck pain with PT  Allergic/Immunologic: Positive for environmental allergies.  Neurological: Negative for tremors.  Psychiatric/Behavioral:       Please refer to HPI    Medications: I have reviewed the patient's current medications.  Current Outpatient Medications  Medication Sig Dispense Refill  . [  START ON 12/11/2019] ALPRAZolam (XANAX) 1 MG tablet TAKE 1 TABLET BY MOUTH  TWICE DAILY AS NEEDED FOR  ANXIETY 60 tablet 2  . bisacodyl (DULCOLAX) 5 MG EC tablet Take 5 mg by mouth daily as needed for moderate constipation.    . carbamazepine (CARBATROL) 300 MG 12 hr capsule Take 2 capsules (600 mg total) by mouth at bedtime. 180 capsule 3  . cetirizine (ZYRTEC) 10 MG tablet Take 10 mg by mouth daily.    Derrill Memo ON 12/11/2019] clonazePAM (KLONOPIN) 1 MG tablet Take 1 tablet (1 mg  total) by mouth at bedtime. 30 tablet 2  . Deutetrabenazine (AUSTEDO) 9 MG TABS Take 18 mg by mouth 2 (two) times daily. 120 tablet 5  . docusate sodium (COLACE) 100 MG capsule Take 100 mg by mouth daily as needed for mild constipation.    . fluticasone (FLONASE) 50 MCG/ACT nasal spray Place into both nostrils daily.    Marland Kitchen levETIRAcetam (KEPPRA) 500 MG tablet Take 1 tablet (500 mg total) by mouth 2 (two) times daily. 180 tablet 3  . lovastatin (MEVACOR) 20 MG tablet TAKE 1 TABLET BY MOUTH  DAILY 90 tablet 3  . Melatonin 10 MG TABS Take 1 tablet by mouth as needed. Reported on 01/30/2016     . Multiple Vitamin (MULTIVITAMIN) tablet Take 1 tablet by mouth daily.    Marland Kitchen OLANZapine (ZYPREXA) 5 MG tablet Take 1 tablet (5 mg total) by mouth at bedtime. 90 tablet 3  . traZODone (DESYREL) 100 MG tablet TAKE 1 TO 2 TABLETS BY  MOUTH AT BEDTIME AS NEEDED  FOR SLEEP 180 tablet 3  . zolpidem (AMBIEN) 10 MG tablet TAKE 1 TABLET BY MOUTH AT BEDTIME AS NEEDED FOR UP TO 30 DAYS 30 tablet 5   No current facility-administered medications for this visit.    Medication Side Effects: None  Allergies:  Allergies  Allergen Reactions  . Lamictal [Lamotrigine]     Acute renal failure  . Ropinirole Nausea And Vomiting  . Codeine Swelling and Rash    Can take hydrocodone  . Penicillins     Abdominal pain    Past Medical History:  Diagnosis Date  . Adenomatous colon polyp   . Allergy   . Anxiety   . Bipolar 1 disorder ([place])   . Blood transfusion without reported diagnosis   . Depression   . Elevated LFTs   . Hepatic steatosis 05/07/13  . Hyperlipidemia   . Seizures ([place])   . Tardive dyskinesia     Family History  Problem Relation Age of Onset  . Colon polyps Mother   . Cancer Mother   . Heart disease Mother   . Alcoholism Mother   . Cancer Father   . Alcoholism Father   . Bipolar disorder Father   . Drug abuse Brother   . Bipolar disorder Other   . Colon cancer Neg Hx   . Stomach cancer Neg Hx    . Breast cancer Neg Hx     Social History   Socioeconomic History  . Marital status: Married    Spouse name: Not on file  . Number of children: Not on file  . Years of education: Not on file  . Highest education level: Not on file  Occupational History  . Not on file  Tobacco Use  . Smoking status: Never Smoker  . Smokeless tobacco: Never Used  Substance and Sexual Activity  . Alcohol use: Yes    Comment: occasionaly 1 a month  .  Drug use: No  . Sexual activity: Not Currently  Other Topics Concern  . 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 Difficulty of Paying Living Expenses:   Food Insecurity:   . Worried About Charity fundraiser in the Last Year:   . Arboriculturist in the Last Year:   Transportation Needs:   . Film/video editor (Medical):   Marland Kitchen Lack of Transportation (Non-Medical):   Physical Activity:   . Days of Exercise per Week:   . Minutes of Exercise per Session:   Stress:   . Feeling of Stress :   Social Connections:   . Frequency of Communication with Friends and Family:   . Frequency of Social Gatherings with Friends and Family:   . Attends Religious Services:   . Active Member of Clubs or Organizations:   . Attends Archivist Meetings:   Marland Kitchen Marital Status:   Intimate Partner Violence:   . Fear of Current or Ex-Partner:   . Emotionally Abused:   Marland Kitchen Physically Abused:   . Sexually Abused:     Past Medical History, Surgical history, Social history, and Family history were reviewed and updated as appropriate.   Please see review of systems for further details on the patient's review from today.   Objective:   Physical Exam:  BP 110/74   Pulse 74   Physical Exam Constitutional:      General: She is not in acute distress. Musculoskeletal:         General: No deformity.  Neurological:     Mental Status: She is alert and oriented to person, place, and time.     Coordination: Coordination normal.  Psychiatric:        Attention and Perception: Attention and perception normal. She does not perceive auditory or visual hallucinations.        Mood and Affect: Mood normal. Mood is not anxious or depressed. Affect is not labile, blunt, angry or inappropriate.        Speech: Speech normal.        Behavior: Behavior normal.        Thought Content: Thought content normal. Thought content is not paranoid or delusional. Thought content does not include homicidal or suicidal ideation. Thought content does not include homicidal or suicidal plan.        Cognition and Memory: Cognition and memory normal.        Judgment: Judgment normal.     Comments: Insight intact     Lab Review:     Component Value Date/Time   NA 142 02/01/2019 1427   K 4.7 02/01/2019 1427   CL 105 02/01/2019 1427   CO2 28 02/01/2019 1427   GLUCOSE 94 02/01/2019 1427   BUN 13 02/01/2019 1427   CREATININE 0.96 02/01/2019 1427   CREATININE 0.91 09/19/2015 0959   CALCIUM 9.8 02/01/2019 1427   PROT 6.5 02/01/2019 1427   ALBUMIN 4.5 02/01/2019 1427   AST 31 02/01/2019 1427   ALT 54 (H) 02/01/2019 1427   ALKPHOS 128 (H) 02/01/2019 1427   BILITOT 0.4 02/01/2019 1427       Component Value Date/Time   WBC 5.4 02/01/2019 1427   RBC 5.17 (H) 02/01/2019 1427   HGB  15.2 (H) 02/01/2019 1427   HCT 45.1 02/01/2019 1427   PLT 195.0 02/01/2019 1427   MCV 87.3 02/01/2019 1427   MCV 87.7 02/07/2013 1110   MCH 28.3 03/10/2015 0957   MCHC 33.8 02/01/2019 1427   RDW 13.3 02/01/2019 1427    No results found for: POCLITH, LITHIUM   Lab Results  Component Value Date   CBMZ 5.2 02/01/2019     .res Assessment: Plan:   Agreed with continuing olanzapine 5 mg at bedtime since patient experienced insomnia and manic signs and symptoms with attempt to come off of olanzapine.   Patient and her husband indicate that manic signs and symptoms have improved after resuming olanzapine several days ago and have almost resolved. Will continue all other medications as prescribed.  Encouraged patient and her husband to contact office if manic signs and symptoms do not completely resolve or worsen. Patient to follow-up in 6 weeks or sooner if clinically indicated. Patient advised to contact office with any questions, adverse effects, or acute worsening in signs and symptoms.  Theresa [name] was seen today for follow-up.  Diagnoses and all orders for this visit:  Bipolar depression ([place]) -     OLANZapine (ZYPREXA) 5 MG tablet; Take 1 tablet (5 mg total) by mouth at bedtime.  Posttraumatic stress disorder -     ALPRAZolam (XANAX) 1 MG tablet; TAKE 1 TABLET BY MOUTH  TWICE DAILY AS NEEDED FOR  ANXIETY  Primary insomnia -     clonazePAM (KLONOPIN) 1 MG tablet; Take 1 tablet (1 mg total) by mouth at bedtime.     Please see After Visit Summary for patient specific instructions.  Future Appointments  Date Time Provider [place]  11/21/2019  3:00 PM Doree Fudge, PhD [facility] None  12/19/2019  3:00 PM Doree Fudge, PhD [facility] None  12/25/2019 11:30 AM [name], [place] [facility] None  01/16/2020  3:00 PM Doree Fudge, PhD [facility] None  02/13/2020  3:00 PM Doree Fudge, PhD [facility] None  03/12/2020  3:00 PM Doree Fudge, PhD [facility] None  04/09/2020  3:00 PM Doree Fudge, PhD [facility] None  05/07/2020  3:00 PM Doree Fudge, PhD [facility] None  06/04/2020  3:00 PM Doree Fudge, PhD [facility] None  07/02/2020  3:00 PM Doree Fudge, PhD [facility] None  07/30/2020  3:00 PM Doree Fudge, PhD [facility] None  08/27/2020  3:00 PM Doree Fudge, PhD [facility] None  09/24/2020  3:00 PM Doree Fudge, PhD [facility] None  09/29/2020  3:00 PM Cameron Sprang, MD [facility] None  10/22/2020  3:00 PM Doree Fudge, PhD [facility] None  11/19/2020  3:00 PM  Doree Fudge, PhD [facility] None  12/17/2020  3:00 PM Doree Fudge, PhD [facility] None    No orders of the defined types were placed in this encounter.   -------------------------------

## 2019-11-13 NOTE — Progress Notes (Signed)
   11/13/19 1117  Facial and Oral Movements  Muscles of Facial Expression 0  Lips and Perioral Area 0  Jaw 1  Tongue 2  Extremity Movements  Upper (arms, wrists, hands, fingers) 0  Lower (legs, knees, ankles, toes) 2  Trunk Movements  Neck, shoulders, hips 1  Overall Severity  Severity of abnormal movements (highest score from questions above) 2  Patient's awareness of abnormal movements (rate only patient's report) 2

## 2019-11-20 ENCOUNTER — Other Ambulatory Visit: Payer: Self-pay | Admitting: Psychiatry

## 2019-11-20 DIAGNOSIS — F5101 Primary insomnia: Secondary | ICD-10-CM

## 2019-11-21 ENCOUNTER — Ambulatory Visit (INDEPENDENT_AMBULATORY_CARE_PROVIDER_SITE_OTHER): Payer: Medicare Other | Admitting: Psychology

## 2019-11-21 DIAGNOSIS — F3181 Bipolar II disorder: Secondary | ICD-10-CM

## 2019-11-21 DIAGNOSIS — F449 Dissociative and conversion disorder, unspecified: Secondary | ICD-10-CM | POA: Diagnosis not present

## 2019-11-21 NOTE — Telephone Encounter (Signed)
Last refill 03/04, gets 30 day through [facility] apt 10/2019, has f/u in May

## 2019-11-22 NOTE — Telephone Encounter (Signed)
Patient declined. Will call back if test is needed. See msg from 3/18

## 2019-12-19 ENCOUNTER — Ambulatory Visit (INDEPENDENT_AMBULATORY_CARE_PROVIDER_SITE_OTHER): Payer: Medicare Other | Admitting: Psychology

## 2019-12-19 DIAGNOSIS — F339 Major depressive disorder, recurrent, unspecified: Secondary | ICD-10-CM

## 2019-12-25 ENCOUNTER — Encounter: Payer: Self-pay | Admitting: Psychiatry

## 2019-12-25 ENCOUNTER — Ambulatory Visit (INDEPENDENT_AMBULATORY_CARE_PROVIDER_SITE_OTHER): Payer: Medicare Other | Admitting: Psychiatry

## 2019-12-25 ENCOUNTER — Other Ambulatory Visit: Payer: Self-pay

## 2019-12-25 DIAGNOSIS — F4310 Post-traumatic stress disorder, unspecified: Secondary | ICD-10-CM

## 2019-12-25 DIAGNOSIS — F319 Bipolar disorder, unspecified: Secondary | ICD-10-CM

## 2019-12-25 DIAGNOSIS — F5101 Primary insomnia: Secondary | ICD-10-CM

## 2019-12-25 MED ORDER — ZOLPIDEM TARTRATE 10 MG PO TABS: ORAL_TABLET | ORAL | 5 refills | Status: DC

## 2019-12-25 MED ORDER — CLONAZEPAM 1 MG PO TABS: 1.0000 mg | ORAL_TABLET | Freq: Every day | ORAL | 2 refills | Status: DC

## 2019-12-25 MED ORDER — OLANZAPINE 5 MG PO TABS: 5.0000 mg | ORAL_TABLET | Freq: Every day | ORAL | 3 refills | Status: DC

## 2019-12-25 MED ORDER — ALPRAZOLAM 1 MG PO TABS: ORAL_TABLET | ORAL | 2 refills | Status: DC

## 2019-12-25 NOTE — Progress Notes (Signed)
Theresa [name] [id number] 04-Feb-1956 64 y.o.  Subjective:   Patient ID:  Theresa [name] is a 64 y.o. (DOB July 04, 1956) female.  Chief Complaint:  Chief Complaint  Patient presents with  . Follow-up    mood s/s, anxiety, and insomnia    HPI Theresa [name] presents to the office today for follow-up of mood, anxiety, and insomnia.  She is accompanied by her husband. She reports that her mood cycles some during the course of a week and has some days where mood is more elevated and other times mood is more depressed. She reports that mood cycling is "about the same." Husband notices at times she is speeding through a moment and if she is unable to slow down. He notices that thought processes are sped up when she is manic. He reports "the downs aren't as bad as the ups sometimes." She reports that she has not had severe depression in awhile. Denies significant manic s/s. Has been able to paint with taking anti-inflammatory prior to painting. Has been trying to be more active and trying to walk twice daily. She has noticed some anxiety. She will feel unsettled and nervous at times. Denies panic attacks.   She reports that she has been sleeping well. Appetite has been good. Trying to lose weight. She reports that her energy is lower in the morning and improves as the day goes on. Denies increased energy or increased goal-directed activity. She reports that she has some periods where motivation and energy are lower. She reports some chronic vague passive death wishes. Denies SI.   She notices if she takes Xanax that her TD is improved. She reports taking Xanax prior to leaving home and when she feels anxious. She reports that she is able to sit still more easily and that movements are less. She has been using Xanax prn for restlessness and anxiety. Reports TD is improved on [place]. She reports that TD is "about the same" compared to last visit. Notices TD is less when she sits down and has her legs  outstretched. Recently has been taking 2 tabs of Trazodone at night.   Husband reports that things "seen to be even keeled."   Has been seeing her therapist monthly.   Past Psychiatric Medication Trials: Olanzapine Seroquel Saphris Depakote-self-injurious behavior Lamictal Keppra Carbamazepine Xanax Klonopin Ambien Trazodone Benztropine Ingrezza Propranolol- Ineffective  AIMS     Office Visit from 06/26/2019 in [place] Visit from 04/26/2019 in [place] Total Score  9  18    Mini-Mental     Office Visit from 02/28/2017 in [facility]  Total Score (max 30 points )  29    PHQ2-9     Office Visit from 02/28/2017 in [facility] [place] Visit from 09/19/2015 in Primary Care at [place] from 06/05/2015 in [place] [facility] [place] from 03/10/2015 in Primary Care at [facility] Total Score  0  0  0  0       Review of Systems:  Review of Systems  Musculoskeletal: Negative for gait problem.  Allergic/Immunologic: Positive for environmental allergies.  Neurological: Positive for tremors. Negative for seizures.  Psychiatric/Behavioral:       Please refer to HPI    Medications: I have reviewed the patient's current medications.  Current Outpatient Medications  Medication Sig Dispense Refill  . ALPRAZolam (XANAX) 1 MG tablet TAKE 1 TABLET  BY MOUTH  TWICE DAILY AS NEEDED FOR  ANXIETY 60 tablet 2  . carbamazepine (CARBATROL) 300 MG 12 hr capsule Take 2 capsules (600 mg total) by mouth at bedtime. 180 capsule 3  . cetirizine (ZYRTEC) 10 MG tablet Take 10 mg by mouth daily.    . clonazePAM (KLONOPIN) 1 MG tablet Take 1 tablet (1 mg total) by mouth at bedtime. 30 tablet 2  . Deutetrabenazine (AUSTEDO) 9 MG TABS Take 18 mg by mouth 2 (two) times daily. 120 tablet 5  . docusate sodium (COLACE) 100 MG capsule Take 100 mg by mouth daily  as needed for mild constipation.    . fluticasone (FLONASE) 50 MCG/ACT nasal spray Place into both nostrils daily.    Marland Kitchen levETIRAcetam (KEPPRA) 500 MG tablet Take 1 tablet (500 mg total) by mouth 2 (two) times daily. 180 tablet 3  . lovastatin (MEVACOR) 20 MG tablet TAKE 1 TABLET BY MOUTH  DAILY 90 tablet 3  . Melatonin 10 MG TABS Take 1 tablet by mouth as needed. Reported on 01/30/2016     . Multiple Vitamin (MULTIVITAMIN) tablet Take 1 tablet by mouth daily.    Marland Kitchen OLANZapine (ZYPREXA) 5 MG tablet Take 1 tablet (5 mg total) by mouth at bedtime. 90 tablet 3  . traZODone (DESYREL) 100 MG tablet TAKE 1 TO 2 TABLETS BY  MOUTH AT BEDTIME AS NEEDED  FOR SLEEP 180 tablet 3  . [START ON 01/15/2020] zolpidem (AMBIEN) 10 MG tablet TAKE 1 TABLET BY MOUTH AT  BEDTIME AS NEEDED 30 tablet 5  . bisacodyl (DULCOLAX) 5 MG EC tablet Take 5 mg by mouth daily as needed for moderate constipation.     No current facility-administered medications for this visit.    Medication Side Effects: None  Allergies:  Allergies  Allergen Reactions  . Lamictal [Lamotrigine]     Acute renal failure  . Ropinirole Nausea And Vomiting  . Codeine Swelling and Rash    Can take hydrocodone  . Penicillins     Abdominal pain    Past Medical History:  Diagnosis Date  . Adenomatous colon polyp   . Allergy   . Anxiety   . Bipolar 1 disorder ([place])   . Blood transfusion without reported diagnosis   . Depression   . Elevated LFTs   . Hepatic steatosis 05/07/13  . Hyperlipidemia   . Seizures ([place])   . Tardive dyskinesia     Family History  Problem Relation Age of Onset  . Colon polyps Mother   . Cancer Mother   . Heart disease Mother   . Alcoholism Mother   . Cancer Father   . Alcoholism Father   . Bipolar disorder Father   . Drug abuse Brother   . Bipolar disorder Other   . Colon cancer Neg Hx   . Stomach cancer Neg Hx   . Breast cancer Neg Hx     Social History   Socioeconomic History  . Marital status:  Married    Spouse name: Not on file  . Number of children: Not on file  . Years of education: Not on file  . Highest education level: Not on file  Occupational History  . Not on file  Tobacco Use  . Smoking status: Never Smoker  . Smokeless tobacco: Never Used  Substance and Sexual Activity  . Alcohol use: Yes    Comment: occasionaly 1 a month  . Drug use: No  . Sexual activity: Not Currently  Other Topics Concern  .  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 Difficulty of Paying Living Expenses:   Food Insecurity:   . Worried About Charity fundraiser in the Last Year:   . Arboriculturist in the Last Year:   Transportation Needs:   . Film/video editor (Medical):   Marland Kitchen Lack of Transportation (Non-Medical):   Physical Activity:   . Days of Exercise per Week:   . Minutes of Exercise per Session:   Stress:   . Feeling of Stress :   Social Connections:   . Frequency of Communication with Friends and Family:   . Frequency of Social Gatherings with Friends and Family:   . Attends Religious Services:   . Active Member of Clubs or Organizations:   . Attends Archivist Meetings:   Marland Kitchen Marital Status:   Intimate Partner Violence:   . Fear of Current or Ex-Partner:   . Emotionally Abused:   Marland Kitchen Physically Abused:   . Sexually Abused:     Past Medical History, Surgical history, Social history, and Family history were reviewed and updated as appropriate.   Please see review of systems for further details on the patient's review from today.   Objective:   Physical Exam:  There were no vitals taken for this visit.  Physical Exam Constitutional:      General: She is not in acute distress. Musculoskeletal:        General: No deformity.  Neurological:     Mental Status: She is  alert and oriented to person, place, and time.     Coordination: Coordination normal.  Psychiatric:        Attention and Perception: Attention and perception normal. She does not perceive auditory or visual hallucinations.        Mood and Affect: Mood normal. Mood is not anxious or depressed. Affect is not labile, blunt, angry or inappropriate.        Speech: Speech normal.        Behavior: Behavior normal.        Thought Content: Thought content normal. Thought content is not paranoid or delusional. Thought content does not include homicidal or suicidal ideation. Thought content does not include homicidal or suicidal plan.        Cognition and Memory: Cognition and memory normal.        Judgment: Judgment normal.     Comments: Insight intact     Lab Review:     Component Value Date/Time   NA 142 02/01/2019 1427   K 4.7 02/01/2019 1427   CL 105 02/01/2019 1427   CO2 28 02/01/2019 1427   GLUCOSE 94 02/01/2019 1427   BUN 13 02/01/2019 1427   CREATININE 0.96 02/01/2019 1427   CREATININE 0.91 09/19/2015 0959   CALCIUM 9.8 02/01/2019 1427   PROT 6.5 02/01/2019 1427   ALBUMIN 4.5 02/01/2019 1427   AST 31 02/01/2019 1427   ALT 54 (H) 02/01/2019 1427   ALKPHOS 128 (H) 02/01/2019 1427   BILITOT 0.4 02/01/2019 1427       Component Value Date/Time   WBC 5.4 02/01/2019 1427   RBC 5.17 (H) 02/01/2019 1427   HGB 15.2 (H) 02/01/2019 1427   HCT 45.1 02/01/2019 1427   PLT 195.0  02/01/2019 1427   MCV 87.3 02/01/2019 1427   MCV 87.7 02/07/2013 1110   MCH 28.3 03/10/2015 0957   MCHC 33.8 02/01/2019 1427   RDW 13.3 02/01/2019 1427    No results found for: POCLITH, LITHIUM   Lab Results  Component Value Date   CBMZ 5.2 02/01/2019     .res Assessment: Plan:   Will continue current plan of care since target signs and symptoms are well controlled without any tolerability issues. Recommend continuing psychotherapy with Dr. Rexene Edison.  Patient follow-up in 8 weeks or sooner if clinically  indicated. Patient advised to contact office with any questions, adverse effects, or acute worsening in signs and symptoms.  Theresa [name] was seen today for follow-up.  Diagnoses and all orders for this visit:  Bipolar depression ([place]) -     OLANZapine (ZYPREXA) 5 MG tablet; Take 1 tablet (5 mg total) by mouth at bedtime.  Primary insomnia -     zolpidem (AMBIEN) 10 MG tablet; TAKE 1 TABLET BY MOUTH AT  BEDTIME AS NEEDED -     clonazePAM (KLONOPIN) 1 MG tablet; Take 1 tablet (1 mg total) by mouth at bedtime.  Posttraumatic stress disorder -     ALPRAZolam (XANAX) 1 MG tablet; TAKE 1 TABLET BY MOUTH  TWICE DAILY AS NEEDED FOR  ANXIETY     Please see After Visit Summary for patient specific instructions.  Future Appointments  Date Time Provider [place]  01/16/2020  3:00 PM Doree Fudge, PhD [facility] None  02/13/2020  3:00 PM Doree Fudge, PhD [facility] None  02/19/2020 11:00 AM [name], [place] [facility] None  03/12/2020  3:00 PM Doree Fudge, PhD [facility] None  04/09/2020  3:00 PM Doree Fudge, PhD [facility] None  05/07/2020  3:00 PM Doree Fudge, PhD [facility] None  06/04/2020  3:00 PM Doree Fudge, PhD [facility] None  07/02/2020  3:00 PM Doree Fudge, PhD [facility] None  07/30/2020  3:00 PM Doree Fudge, PhD [facility] None  08/27/2020  3:00 PM Doree Fudge, PhD [facility] None  09/24/2020  3:00 PM Doree Fudge, PhD [facility] None  09/29/2020  3:00 PM Cameron Sprang, MD [facility] None  10/22/2020  3:00 PM Doree Fudge, PhD [facility] None  11/19/2020  3:00 PM Doree Fudge, PhD [facility] None  12/17/2020  3:00 PM Doree Fudge, PhD [facility] None    No orders of the defined types were placed in this encounter.   -------------------------------

## 2020-01-11 ENCOUNTER — Other Ambulatory Visit: Payer: Self-pay | Admitting: Obstetrics and Gynecology

## 2020-01-11 DIAGNOSIS — Z1231 Encounter for screening mammogram for malignant neoplasm of breast: Secondary | ICD-10-CM

## 2020-01-16 ENCOUNTER — Ambulatory Visit (INDEPENDENT_AMBULATORY_CARE_PROVIDER_SITE_OTHER): Payer: Medicare Other | Admitting: Psychology

## 2020-01-16 DIAGNOSIS — F3181 Bipolar II disorder: Secondary | ICD-10-CM

## 2020-01-22 ENCOUNTER — Telehealth: Payer: Self-pay | Admitting: Psychiatry

## 2020-01-22 NOTE — Telephone Encounter (Signed)
Pt called and LM to report that she has not slept more than 2 hours the last 6 nights. May be manic. Needs a med adjustment.

## 2020-01-22 NOTE — Telephone Encounter (Signed)
Patient aware and will call back on Friday

## 2020-01-24 ENCOUNTER — Telehealth: Payer: Self-pay | Admitting: Psychiatry

## 2020-01-24 NOTE — Telephone Encounter (Signed)
Pt lm that she is still unable to sleep. Pt said to please call to see what else she can take.

## 2020-01-25 ENCOUNTER — Ambulatory Visit (INDEPENDENT_AMBULATORY_CARE_PROVIDER_SITE_OTHER): Payer: Medicare Other | Admitting: Psychology

## 2020-01-25 DIAGNOSIS — F440 Dissociative amnesia: Secondary | ICD-10-CM | POA: Diagnosis not present

## 2020-01-30 ENCOUNTER — Telehealth: Payer: Self-pay | Admitting: Psychiatry

## 2020-01-30 NOTE — Telephone Encounter (Signed)
Theresa [name] did not hear back from her 6/3 call. She is not sleeping and she is taking double of Xanax, Klonopin, and Olanzepine to help sleep. She needs advise.

## 2020-01-30 NOTE — Telephone Encounter (Signed)
Returned call to pt. Pt reports that she became manic 3 weeks ago and "was kind of ok for about 2 weeks." She reports that she then started having sleeplessness. She reports that she tried taking increased dose of Zyprexa as directed and had minimal improvement. She reports that she then increased several of her other medications and slept slightly more. She reports that her sleep was poor last night and estimates sleeping about 3 hours. Slept slightly more the nights before last.   She reports that s/s started with her thoughts racing and having different ideas that were waking her up. Noticed she was more talkative. She reports that energy has been low due to decreased sleep. Denies impulsivity or risky behaviors. She reports that she has been worried about sleeplessness and recent s/s. Denies worsening anxiety. Denies SI.   Denies any worsening in TD.   Plan: Take Xanax twice daily during the day. Discussed not taking Xanax at night. Will increase Olanzapine 15 mg po QHS. Continue Klonopin at bedtime. Continue Trazodone 200 mg po QHS. Can take an additional Trazodone 100 mg prn for middle of the night awakening. Continue Ambien at night.   Will call pt tomorrow to f/u. Patient advised to contact office with any questions, adverse effects, or acute worsening in signs and symptoms. Discussed safety plan at length with patient.  Advised patient to contact office with any worsening signs and symptoms.  Instructed patient to go to the [facility] emergency room for evaluation if experiencing any acute safety concerns, to include suicidal intent.

## 2020-01-31 ENCOUNTER — Telehealth: Payer: Self-pay | Admitting: Psychiatry

## 2020-01-31 NOTE — Telephone Encounter (Signed)
Called pt to f/u on how Theresa [name] is doing after telephone call yesterday with med changes. Theresa [name] reports that Theresa [name] was able to sleep 6 hours last night and feels much better today. Theresa [name] reports that racing thoughts have also improved. Discussed continuing current medications for the next 3 days through the weekend. Recommended that Theresa [name] contact the office on Monday with an update re: how Theresa [name] is doing. Patient advised to contact office with any questions, adverse effects, or acute worsening in signs and symptoms.

## 2020-02-04 ENCOUNTER — Telehealth: Payer: Self-pay | Admitting: Psychiatry

## 2020-02-04 NOTE — Telephone Encounter (Signed)
Pt called to report to provider. Since med change, Pt is sleeping better. Pt wakes during the night and takes Trazodone. Will continue until next apt 6/29. Feeling much better.

## 2020-02-13 ENCOUNTER — Ambulatory Visit: Payer: Medicare Other | Admitting: Psychology

## 2020-02-15 ENCOUNTER — Telehealth: Payer: Self-pay | Admitting: Neurology

## 2020-02-15 NOTE — Telephone Encounter (Signed)
Pt called no answer voice mail left for pt to call the office back  

## 2020-02-15 NOTE — Telephone Encounter (Signed)
Patient called to report she had a grand mal seizure on 6/23. She believes she had it due to lack of sleep from anxiety. States she was told to report any seizure activity.

## 2020-02-15 NOTE — Telephone Encounter (Signed)
Thanks for letting me know. Pls ask how she feels about her medications, there is room to increase the Keppra. Other option is to stay on the same doses and avoid sleep deprivation, if seizures recur, would increase dose at that point. Pls remind of [place] driving laws no driving until 6 mos seizure-free. Thanks

## 2020-02-19 ENCOUNTER — Other Ambulatory Visit: Payer: Self-pay

## 2020-02-19 ENCOUNTER — Ambulatory Visit (INDEPENDENT_AMBULATORY_CARE_PROVIDER_SITE_OTHER): Payer: Medicare Other | Admitting: Psychiatry

## 2020-02-19 ENCOUNTER — Encounter: Payer: Self-pay | Admitting: Psychiatry

## 2020-02-19 DIAGNOSIS — F4310 Post-traumatic stress disorder, unspecified: Secondary | ICD-10-CM | POA: Diagnosis not present

## 2020-02-19 DIAGNOSIS — F319 Bipolar disorder, unspecified: Secondary | ICD-10-CM

## 2020-02-19 DIAGNOSIS — G40309 Generalized idiopathic epilepsy and epileptic syndromes, not intractable, without status epilepticus: Secondary | ICD-10-CM | POA: Diagnosis not present

## 2020-02-19 MED ORDER — OLANZAPINE 10 MG PO TABS: 10.0000 mg | ORAL_TABLET | Freq: Every day | ORAL | 0 refills | Status: DC

## 2020-02-19 MED ORDER — CARBAMAZEPINE ER 300 MG PO CP12: ORAL_CAPSULE | ORAL | 0 refills | Status: DC

## 2020-02-19 MED ORDER — ALPRAZOLAM ER 2 MG PO TB24: 2.0000 mg | ORAL_TABLET | Freq: Two times a day (BID) | ORAL | 0 refills | Status: DC

## 2020-02-19 NOTE — Progress Notes (Signed)
Theresa [name] [id number] 02/03/1956 64 y.o.  Subjective:   Patient ID:  Theresa [name] is a 64 y.o. (DOB 08-28-1955) female.  Chief Complaint:  Chief Complaint  Patient presents with  . Manic Behavior  . Insomnia  . Medication Reaction    Akathisia  . Anxiety    HPI Theresa [name] presents to the office today for follow-up of manic/mixed s/s, anxiety, akathisia, insomnia, and TD. She had manic s/s and was only sleeping about 2 hours a night. Olanzapine was increased to 15 mg po QHS. She has been taking more Xanax than prescribed and reports that she is needing to take 2 tabs of Xanax to notice any benefit. Reports that she is sometimes taking 2 tabs in the morning, 2 tabs mid-day, and 1 tab at night. Taking Klonopin at night. Denies panic attacks.   She reports that last week she had extreme difficulty sitting still for an art class and this triggered severe anxiety. Also had difficulty sitting still during RV trip. She reports that restlessness is not causing insomnia. She reports that she had a seizure last Wednesday. Reports that she had more sleep a few nights prior to the seizure. She has gone back to not sleeping and having manic s/s. She estimates sleeping about 5 hours of sleep last night. "I'm exhausted but not sleeping." She reports that she has been having racing thoughts. Energy has been low. She reports that she is "depressed that I feel this way." She reports occasional SI without intent or plan.   Denies paranoia.   Husband reports that most of her seizures have occurred in anticipation of a significant event. He reports that she has had manic s/s for at least 3 weeks. He has noticed that she has been busier doing things, sometimes well in advance. He reports that family has said she is sending more emails and has noticed a change in the content of her email.   Past Psychiatric Medication Trials: Olanzapine Seroquel Saphris Depakote-self-injurious behavior Lamictal  Keppra Carbamazepine Xanax Klonopin Ambien Trazodone Benztropine Ingrezza Propranolol- Ineffective  AIMS     Office Visit from 06/26/2019 in [place] Visit from 04/26/2019 in [place] Total Score 9 18    Mini-Mental     Office Visit from 02/28/2017 in [facility] at [facility]  Total Score (max 30 points ) 29    PHQ2-9     Office Visit from 02/28/2017 in [facility] [place] Visit from 09/19/2015 in [facility] at [place] from 06/05/2015 in [place] at [place] from 03/10/2015 in Primary Care at [facility] Total Score 0 0 0 0       Review of Systems:  Review of Systems  Gastrointestinal: Negative.   Musculoskeletal: Negative for gait problem.  Allergic/Immunologic: Positive for environmental allergies.  Neurological: Positive for seizures. Negative for tremors.  Psychiatric/Behavioral:       Please refer to HPI    Medications: I have reviewed the patient's current medications.  Current Outpatient Medications  Medication Sig Dispense Refill  . carbamazepine (CARBATROL) 300 MG 12 hr capsule Take 1 capsule (300 mg total) by mouth every morning AND 2 capsules (600 mg total) at bedtime. 90 capsule 0  . cetirizine (ZYRTEC) 10 MG tablet Take 10 mg by mouth daily.    . clonazePAM (KLONOPIN) 1 MG tablet Take 1 tablet (1 mg total) by mouth at bedtime.  30 tablet 2  . Deutetrabenazine (AUSTEDO) 9 MG TABS Take 18 mg by mouth 2 (two) times daily. 120 tablet 5  . docusate sodium (COLACE) 100 MG capsule Take 100 mg by mouth daily as needed for mild constipation.    . fluticasone (FLONASE) 50 MCG/ACT nasal spray Place into both nostrils daily.    Marland Kitchen levETIRAcetam (KEPPRA) 500 MG tablet Take 1 tablet (500 mg total) by mouth 2 (two) times daily. 180 tablet 3  . lovastatin (MEVACOR) 20 MG tablet TAKE 1 TABLET BY MOUTH  DAILY 90 tablet 3  . Melatonin  10 MG TABS Take 1 tablet by mouth as needed. Reported on 01/30/2016     . Multiple Vitamin (MULTIVITAMIN) tablet Take 1 tablet by mouth daily.    Marland Kitchen OLANZapine (ZYPREXA) 10 MG tablet Take 1 tablet (10 mg total) by mouth at bedtime. 90 tablet 0  . traZODone (DESYREL) 100 MG tablet TAKE 1 TO 2 TABLETS BY  MOUTH AT BEDTIME AS NEEDED  FOR SLEEP 180 tablet 3  . zolpidem (AMBIEN) 10 MG tablet TAKE 1 TABLET BY MOUTH AT  BEDTIME AS NEEDED 30 tablet 5  . ALPRAZolam (XANAX XR) 2 MG 24 hr tablet Take 1 tablet (2 mg total) by mouth in the morning and at bedtime. 60 tablet 0  . bisacodyl (DULCOLAX) 5 MG EC tablet Take 5 mg by mouth daily as needed for moderate constipation. (Patient not taking: Reported on 02/19/2020)     No current facility-administered medications for this visit.    Medication Side Effects: Other: Akathisia  Allergies:  Allergies  Allergen Reactions  . Lamictal [Lamotrigine]     Acute renal failure  . Ropinirole Nausea And Vomiting  . Codeine Swelling and Rash    Can take hydrocodone  . Penicillins     Abdominal pain    Past Medical History:  Diagnosis Date  . Adenomatous colon polyp   . Allergy   . Anxiety   . Bipolar 1 disorder ([place])   . Blood transfusion without reported diagnosis   . Depression   . Elevated LFTs   . Hepatic steatosis 05/07/13  . Hyperlipidemia   . Seizures ([place])   . Tardive dyskinesia     Family History  Problem Relation Age of Onset  . Colon polyps Mother   . Cancer Mother   . Heart disease Mother   . Alcoholism Mother   . Cancer Father   . Alcoholism Father   . Bipolar disorder Father   . Drug abuse Brother   . Bipolar disorder Other   . Colon cancer Neg Hx   . Stomach cancer Neg Hx   . Breast cancer Neg Hx     Social History   Socioeconomic History  . Marital status: Married    Spouse name: Not on file  . Number of children: Not on file  . Years of education: Not on file  . Highest education level: Not on file  Occupational  History  . Not on file  Tobacco Use  . Smoking status: Never Smoker  . Smokeless tobacco: Never Used  Vaping Use  . Vaping Use: Never used  Substance and Sexual Activity  . Alcohol use: Yes    Comment: occasionaly 1 a month  . Drug use: No  . Sexual activity: Not Currently  Other Topics Concern  . 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 Difficulty of Paying Living Expenses:   Food Insecurity:   . Worried About Charity fundraiser in the Last Year:   . Arboriculturist in the Last Year:   Transportation Needs:   . Film/video editor (Medical):   Marland Kitchen Lack of Transportation (Non-Medical):   Physical Activity:   . Days of Exercise per Week:   . Minutes of Exercise per Session:   Stress:   . Feeling of Stress :   Social Connections:   . Frequency of Communication with Friends and Family:   . Frequency of Social Gatherings with Friends and Family:   . Attends Religious Services:   . Active Member of Clubs or Organizations:   . Attends Archivist Meetings:   Marland Kitchen Marital Status:   Intimate Partner Violence:   . Fear of Current or Ex-Partner:   . Emotionally Abused:   Marland Kitchen Physically Abused:   . Sexually Abused:     Past Medical History, Surgical history, Social history, and Family history were reviewed and updated as appropriate.   Please see review of systems for further details on the patient's review from today.   Objective:   Physical Exam:  There were no vitals taken for this visit.  Physical Exam Constitutional:      General: She is not in acute distress. Musculoskeletal:        General: No deformity.  Neurological:     Mental Status: She is alert and oriented to person, place, and time.     Coordination: Coordination normal.  Psychiatric:        Attention and  Perception: Attention and perception normal. She does not perceive auditory or visual hallucinations.        Mood and Affect: Mood is anxious. Affect is not labile, blunt, angry or inappropriate.        Speech: Speech normal.        Behavior: Behavior normal. Behavior is cooperative.        Thought Content: Thought content normal. Thought content is not paranoid or delusional. Thought content does not include homicidal or suicidal ideation. Thought content does not include homicidal or suicidal plan.        Cognition and Memory: Cognition and memory normal.        Judgment: Judgment normal.     Comments: Insight intact Dysphoric mood     Lab Review:     Component Value Date/Time   NA 142 02/01/2019 1427   K 4.7 02/01/2019 1427   CL 105 02/01/2019 1427   CO2 28 02/01/2019 1427   GLUCOSE 94 02/01/2019 1427   BUN 13 02/01/2019 1427   CREATININE 0.96 02/01/2019 1427   CREATININE 0.91 09/19/2015 0959   CALCIUM 9.8 02/01/2019 1427   PROT 6.5 02/01/2019 1427   ALBUMIN 4.5 02/01/2019 1427   AST 31 02/01/2019 1427   ALT 54 (H) 02/01/2019 1427   ALKPHOS 128 (H) 02/01/2019 1427   BILITOT 0.4 02/01/2019 1427       Component Value Date/Time   WBC 5.4 02/01/2019 1427   RBC 5.17 (H) 02/01/2019 1427   HGB 15.2 (H) 02/01/2019 1427   HCT 45.1 02/01/2019 1427   PLT 195.0 02/01/2019 1427   MCV 87.3 02/01/2019 1427   MCV 87.7 02/07/2013 1110   MCH 28.3 03/10/2015 0957   MCHC 33.8 02/01/2019 1427  RDW 13.3 02/01/2019 1427    No results found for: POCLITH, LITHIUM   Lab Results  Component Value Date   CBMZ 5.2 02/01/2019     .res Assessment: Plan:   Case staffed with Dr. Clovis Pu.  Discussed that taking higher doses of Xanax may be increasing risk of seizures when Xanax wears off and therefore recommend changing Xanax immediate release to Xanax XR 2 mg BID for continuous coverage, particularly since pt reports that Klonopin is not as effective as Xanax for manic s/s.  Will also  increase Carbamazepine ER to 300 mg po q am and 600 mg po QHS for mood stabilization. Discussed that increase in Carbamazepine should help decrease manic s/s and also anxiety. Will decrease Olanzapine to 10 mg po QHS since pt is having significant akathisia at 15 mg dose.  Pt to f/u in 2 weeks or sooner if clinically indicated.  Patient advised to contact office with any questions, adverse effects, or acute worsening in signs and symptoms.  Theresa [name] was seen today for manic behavior, insomnia, medication reaction and anxiety.  Diagnoses and all orders for this visit:  Posttraumatic stress disorder -     ALPRAZolam (XANAX XR) 2 MG 24 hr tablet; Take 1 tablet (2 mg total) by mouth in the morning and at bedtime.  Generalized idiopathic epilepsy and epileptic syndromes, not intractable, without status epilepticus (HCC) -     carbamazepine (CARBATROL) 300 MG 12 hr capsule; Take 1 capsule (300 mg total) by mouth every morning AND 2 capsules (600 mg total) at bedtime.  Bipolar depression ([place]) -     OLANZapine (ZYPREXA) 10 MG tablet; Take 1 tablet (10 mg total) by mouth at bedtime.     Please see After Visit Summary for patient specific instructions.  Future Appointments  Date Time Provider [place]  02/26/2020  1:20 PM [facility] MM 2 [facility]  02/27/2020  3:00 PM Doree Fudge, PhD [facility] None  03/06/2020 12:00 PM [name], [place] [facility] None  03/12/2020  3:00 PM Doree Fudge, PhD [facility] None  04/09/2020  3:00 PM Doree Fudge, PhD [facility] None  05/07/2020  3:00 PM Doree Fudge, PhD [facility] None  06/04/2020  3:00 PM Doree Fudge, PhD [facility] None  07/02/2020  3:00 PM Doree Fudge, PhD [facility] None  07/30/2020  3:00 PM Doree Fudge, PhD [facility] None  08/27/2020  3:00 PM Doree Fudge, PhD [facility] None  09/24/2020  3:00 PM Doree Fudge, PhD [facility] None  09/29/2020  3:00 PM Cameron Sprang, MD [facility] None  10/22/2020  3:00 PM Doree Fudge, PhD [facility] None  11/19/2020  3:00 PM Doree Fudge, PhD [facility] None  12/17/2020  3:00 PM Doree Fudge, PhD [facility] None    No orders of the defined types were placed in this encounter.   -------------------------------

## 2020-02-22 ENCOUNTER — Other Ambulatory Visit: Payer: Self-pay | Admitting: Psychiatry

## 2020-02-22 DIAGNOSIS — F319 Bipolar disorder, unspecified: Secondary | ICD-10-CM

## 2020-02-26 ENCOUNTER — Ambulatory Visit
Admission: RE | Admit: 2020-02-26 | Discharge: 2020-02-26 | Disposition: A | Discharge status: Home / Self Care | Payer: Medicare Other | Source: Ambulatory Visit | Attending: Obstetrics and Gynecology | Admitting: Obstetrics and Gynecology

## 2020-02-26 ENCOUNTER — Other Ambulatory Visit: Payer: Self-pay | Admitting: Psychiatry

## 2020-02-26 ENCOUNTER — Telehealth: Payer: Self-pay | Admitting: Psychiatry

## 2020-02-26 ENCOUNTER — Telehealth: Payer: Self-pay

## 2020-02-26 ENCOUNTER — Other Ambulatory Visit: Payer: Self-pay

## 2020-02-26 DIAGNOSIS — F5101 Primary insomnia: Secondary | ICD-10-CM

## 2020-02-26 DIAGNOSIS — Z1231 Encounter for screening mammogram for malignant neoplasm of breast: Secondary | ICD-10-CM

## 2020-02-26 NOTE — Telephone Encounter (Signed)
Noted, thanks!

## 2020-02-26 NOTE — Telephone Encounter (Signed)
Pt called wanted to let you know that [name] NP increased her Carbamazepine to 1 ( 300 mg) cap  In the am and 2 (300mg ) caps in the pm. So far pt is tolerating the increase well,. Pt stated she will call the office with updates,

## 2020-02-26 NOTE — Telephone Encounter (Signed)
Patient called and said that she has been taking the medicine as prescribed and was still experiencing shaking so she decreased the olzapine to 5 mg and that is working for her. The shaking has stopped. She just wanted to let [place] know

## 2020-02-27 ENCOUNTER — Ambulatory Visit (INDEPENDENT_AMBULATORY_CARE_PROVIDER_SITE_OTHER): Payer: Medicare Other | Admitting: Psychology

## 2020-02-27 DIAGNOSIS — F419 Anxiety disorder, unspecified: Secondary | ICD-10-CM | POA: Diagnosis not present

## 2020-02-27 DIAGNOSIS — F3181 Bipolar II disorder: Secondary | ICD-10-CM

## 2020-02-28 ENCOUNTER — Other Ambulatory Visit: Payer: Self-pay | Admitting: Obstetrics and Gynecology

## 2020-02-28 DIAGNOSIS — R928 Other abnormal and inconclusive findings on diagnostic imaging of breast: Secondary | ICD-10-CM

## 2020-03-05 ENCOUNTER — Ambulatory Visit (INDEPENDENT_AMBULATORY_CARE_PROVIDER_SITE_OTHER): Payer: Medicare Other | Admitting: Psychology

## 2020-03-05 DIAGNOSIS — F449 Dissociative and conversion disorder, unspecified: Secondary | ICD-10-CM | POA: Diagnosis not present

## 2020-03-05 DIAGNOSIS — F3181 Bipolar II disorder: Secondary | ICD-10-CM

## 2020-03-06 ENCOUNTER — Encounter: Payer: Self-pay | Admitting: Psychiatry

## 2020-03-06 ENCOUNTER — Other Ambulatory Visit: Payer: Self-pay

## 2020-03-06 ENCOUNTER — Ambulatory Visit (INDEPENDENT_AMBULATORY_CARE_PROVIDER_SITE_OTHER): Payer: Medicare Other | Admitting: Psychiatry

## 2020-03-06 DIAGNOSIS — F319 Bipolar disorder, unspecified: Secondary | ICD-10-CM

## 2020-03-06 DIAGNOSIS — F5101 Primary insomnia: Secondary | ICD-10-CM

## 2020-03-06 DIAGNOSIS — G40309 Generalized idiopathic epilepsy and epileptic syndromes, not intractable, without status epilepticus: Secondary | ICD-10-CM | POA: Diagnosis not present

## 2020-03-06 DIAGNOSIS — F4310 Post-traumatic stress disorder, unspecified: Secondary | ICD-10-CM | POA: Diagnosis not present

## 2020-03-06 DIAGNOSIS — G2401 Drug induced subacute dyskinesia: Secondary | ICD-10-CM | POA: Diagnosis not present

## 2020-03-06 MED ORDER — AUSTEDO 12 MG PO TABS: ORAL_TABLET | ORAL | 5 refills | Status: DC

## 2020-03-06 MED ORDER — AUSTEDO 9 MG PO TABS: ORAL_TABLET | ORAL | 5 refills | Status: DC

## 2020-03-06 MED ORDER — CLONAZEPAM 1 MG PO TABS: 1.0000 mg | ORAL_TABLET | Freq: Every day | ORAL | 2 refills | Status: DC

## 2020-03-06 MED ORDER — OLANZAPINE 5 MG PO TABS: 5.0000 mg | ORAL_TABLET | Freq: Every day | ORAL | 0 refills | Status: DC

## 2020-03-06 MED ORDER — CARBAMAZEPINE ER 300 MG PO CP12: ORAL_CAPSULE | ORAL | 0 refills | Status: DC

## 2020-03-06 NOTE — Progress Notes (Signed)
   03/06/20 1213  Facial and Oral Movements  Muscles of Facial Expression 2  Lips and Perioral Area 1  Jaw 0  Tongue 2  Extremity Movements  Upper (arms, wrists, hands, fingers) 0  Lower (legs, knees, ankles, toes) 3  Trunk Movements  Neck, shoulders, hips 0  Overall Severity  Severity of abnormal movements (highest score from questions above) 3  Incapacitation due to abnormal movements 4  Patient's awareness of abnormal movements (rate only patient's report) 4  Dental Status  Current problems with teeth and/or dentures? No  AIMS Total Score  AIMS Total Score 19

## 2020-03-06 NOTE — Progress Notes (Signed)
Theresa [name] [id number] [birth year] 64 y.o.  Subjective:   Patient ID:  Theresa [name] is a 64 y.o. (DOB 1955/10/12) female.  Chief Complaint:  Chief Complaint  Patient presents with  . Follow-up    Recent manic s/s, insomnia, anxiety, and TD    HPI Theresa [name] presents to the office today for follow-up of mood disturbance, anxiety, insomnia, and TD. Reports that she has been feeling "better" and able to sleep 8 hours. She reports that her mood has been "just fine... normal." Occasional fast thoughts. Denies impulsive or risky behavior. Denies depressed mood.  Energy has been "fine." motivation has been "ok." Able to sit an relax now when she is sitting on her bed with her legs extended. She reports that Xanax XR seems to be helpful. She reports that her anxiety has improved. Reports anxiety seems to happen periodically when TD flares. Denies panic attacks. Appetite has been fine. Concentration has been ok. Has chronic difficulty with executive function. Denies SI.   Reduced Olanzapine to 5 mg and this has helped with restlessness. Continues to have some mild restlessness. She reports that Xanax seems to help some with TD. Has difficulty painting due to TD.   No further seizure activity.   Has been having EMDR with therapist. Had some anticipatory anxiety with EMDR session.   Past Psychiatric Medication Trials: Olanzapine Seroquel Saphris Depakote-self-injurious behavior Lamictal Keppra Carbamazepine Xanax Klonopin Ambien Trazodone Benztropine Ingrezza Propranolol- Ineffective   AIMS     Office Visit from 03/06/2020 in [place] Visit from 06/26/2019 in [place] Visit from 04/26/2019 in [place] Total Score 19 9 18     Mini-Mental     Office Visit from 02/28/2017 in [facility]  Total Score (max 30 points ) 29    PHQ2-9     Office Visit from  02/28/2017 in [facility] Visit from 09/19/2015 in [facility] at [place] from 06/05/2015 in [place] [facility] [place] from 03/10/2015 in [facility] at [facility] Total Score 0 0 0 0       Review of Systems:  Review of Systems  Medications: I have reviewed the patient's current medications.  Current Outpatient Medications  Medication Sig Dispense Refill  . ALPRAZolam (XANAX XR) 2 MG 24 hr tablet Take 1 tablet (2 mg total) by mouth in the morning and at bedtime. 60 tablet 0  . carbamazepine (CARBATROL) 300 MG 12 hr capsule Take 1 capsule (300 mg total) by mouth every morning AND 2 capsules (600 mg total) at bedtime. 270 capsule 0  . cetirizine (ZYRTEC) 10 MG tablet Take 10 mg by mouth daily.    Derrill Memo ON 03/24/2020] clonazePAM (KLONOPIN) 1 MG tablet Take 1 tablet (1 mg total) by mouth at bedtime. 30 tablet 2  . Deutetrabenazine (AUSTEDO) 9 MG TABS Take 1 tab po BID with a 12 mg tab to equal 21 mg BID 60 tablet 5  . docusate sodium (COLACE) 100 MG capsule Take 100 mg by mouth daily as needed for mild constipation.    . fluticasone (FLONASE) 50 MCG/ACT nasal spray Place into both nostrils daily.    Marland Kitchen levETIRAcetam (KEPPRA) 500 MG tablet Take 1 tablet (500 mg total) by mouth 2 (two) times daily. 180 tablet 3  . lovastatin (MEVACOR) 20 MG tablet TAKE 1 TABLET BY MOUTH  DAILY 90 tablet 3  .  Melatonin 10 MG TABS Take 1 tablet by mouth as needed. Reported on 01/30/2016     . Multiple Vitamin (MULTIVITAMIN) tablet Take 1 tablet by mouth daily.    Marland Kitchen OLANZapine (ZYPREXA) 5 MG tablet Take 1 tablet (5 mg total) by mouth at bedtime. 90 tablet 0  . traZODone (DESYREL) 100 MG tablet TAKE 1 TO 2 TABLETS BY  MOUTH AT BEDTIME AS NEEDED  FOR SLEEP 180 tablet 3  . zolpidem (AMBIEN) 10 MG tablet TAKE 1 TABLET BY MOUTH AT  BEDTIME AS NEEDED 30 tablet 5  . bisacodyl (DULCOLAX) 5 MG EC tablet Take 5 mg by mouth daily as needed for moderate  constipation. (Patient not taking: Reported on 02/19/2020)    . Deutetrabenazine (AUSTEDO) 12 MG TABS Take 1 tablet po BID with a 9 mg tablet to equal 21 mg po BID 60 tablet 5   No current facility-administered medications for this visit.    Medication Side Effects: Other: TD  Allergies:  Allergies  Allergen Reactions  . Lamictal [Lamotrigine]     Acute renal failure  . Ropinirole Nausea And Vomiting  . Codeine Swelling and Rash    Can take hydrocodone  . Penicillins     Abdominal pain    Past Medical History:  Diagnosis Date  . Adenomatous colon polyp   . Allergy   . Anxiety   . Bipolar 1 disorder ([place])   . Blood transfusion without reported diagnosis   . Depression   . Elevated LFTs   . Hepatic steatosis 05/07/13  . Hyperlipidemia   . Seizures ([place])   . Tardive dyskinesia     Family History  Problem Relation Age of Onset  . Colon polyps Mother   . Cancer Mother   . Heart disease Mother   . Alcoholism Mother   . Cancer Father   . Alcoholism Father   . Bipolar disorder Father   . Drug abuse Brother   . Bipolar disorder Other   . Colon cancer Neg Hx   . Stomach cancer Neg Hx   . Breast cancer Neg Hx     Social History   Socioeconomic History  . Marital status: Married    Spouse name: Not on file  . Number of children: Not on file  . Years of education: Not on file  . Highest education level: Not on file  Occupational History  . Not on file  Tobacco Use  . Smoking status: Never Smoker  . Smokeless tobacco: Never Used  Vaping Use  . Vaping Use: Never used  Substance and Sexual Activity  . Alcohol use: Yes    Comment: occasionaly 1 a month  . Drug use: No  . Sexual activity: Not Currently  Other Topics Concern  . 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 Difficulty of Paying Living Expenses: Not very hard  Food Insecurity: No Food Insecurity  . Worried About Charity fundraiser in the Last Year: Never true  . Ran Out of Food in the Last Year: Never true  Transportation Needs: No Transportation Needs  . Lack of Transportation (Medical): No  . Lack of Transportation (Non-Medical): No  Physical Activity:   . Days of Exercise per Week:   .  Minutes of Exercise per Session:   Stress:   . Feeling of Stress :   Social Connections:   . Frequency of Communication with Friends and Family:   . Frequency of Social Gatherings with Friends and Family:   . Attends Religious Services:   . Active Member of Clubs or Organizations:   . Attends Archivist Meetings:   Marland Kitchen Marital Status:   Intimate Partner Violence: Not At Risk  . Fear of Current or Ex-Partner: No  . Emotionally Abused: No  . Physically Abused: No  . Sexually Abused: No    Past Medical History, Surgical history, Social history, and Family history were reviewed and updated as appropriate.   Please see review of systems for further details on the patient's review from today.   Objective:   Physical Exam:  There were no vitals taken for this visit.  Physical Exam Constitutional:      General: She is not in acute distress. Musculoskeletal:        General: No deformity.  Neurological:     Mental Status: She is alert and oriented to person, place, and time.     Coordination: Coordination normal.  Psychiatric:        Attention and Perception: Attention and perception normal. She does not perceive auditory or visual hallucinations.        Mood and Affect: Mood normal. Mood is not anxious or depressed. Affect is not labile, blunt, angry or inappropriate.        Speech: Speech normal.        Behavior: Behavior normal.        Thought Content: Thought content normal. Thought content is not paranoid or delusional. Thought content does not include homicidal or suicidal  ideation. Thought content does not include homicidal or suicidal plan.        Cognition and Memory: Cognition and memory normal.        Judgment: Judgment normal.     Comments: Insight intact     Lab Review:     Component Value Date/Time   NA 142 02/01/2019 1427   K 4.7 02/01/2019 1427   CL 105 02/01/2019 1427   CO2 28 02/01/2019 1427   GLUCOSE 94 02/01/2019 1427   BUN 13 02/01/2019 1427   CREATININE 0.96 02/01/2019 1427   CREATININE 0.91 09/19/2015 0959   CALCIUM 9.8 02/01/2019 1427   PROT 6.5 02/01/2019 1427   ALBUMIN 4.5 02/01/2019 1427   AST 31 02/01/2019 1427   ALT 54 (H) 02/01/2019 1427   ALKPHOS 128 (H) 02/01/2019 1427   BILITOT 0.4 02/01/2019 1427       Component Value Date/Time   WBC 5.4 02/01/2019 1427   RBC 5.17 (H) 02/01/2019 1427   HGB 15.2 (H) 02/01/2019 1427   HCT 45.1 02/01/2019 1427   PLT 195.0 02/01/2019 1427   MCV 87.3 02/01/2019 1427   MCV 87.7 02/07/2013 1110   MCH 28.3 03/10/2015 0957   MCHC 33.8 02/01/2019 1427   RDW 13.3 02/01/2019 1427    No results found for: POCLITH, LITHIUM   Lab Results  Component Value Date   CBMZ 5.2 02/01/2019     .res Assessment: Plan:   Pt seen for 30 minutes. Will increase Austedo to 21 mg (9 mg+ 12 mg tablet) po BID for TD since pt has had partial response with lower dose.  Will continue all other medications as prescribed.  Recommend continuing psychotherapy with Pervis Hocking, PhD. Pt to f/u in 4 weeks or  sooner if clinically indicated.  Patient advised to contact office with any questions, adverse effects, or acute worsening in signs and symptoms.  Theresa [name] was seen today for follow-up.  Diagnoses and all orders for this visit:  Tardive dyskinesia -     Deutetrabenazine (AUSTEDO) 9 MG TABS; Take 1 tab po BID with a 12 mg tab to equal 21 mg BID -     Deutetrabenazine (AUSTEDO) 12 MG TABS; Take 1 tablet po BID with a 9 mg tablet to equal 21 mg po BID  Posttraumatic stress disorder  Generalized  idiopathic epilepsy and epileptic syndromes, not intractable, without status epilepticus (HCC) -     carbamazepine (CARBATROL) 300 MG 12 hr capsule; Take 1 capsule (300 mg total) by mouth every morning AND 2 capsules (600 mg total) at bedtime.  Primary insomnia -     clonazePAM (KLONOPIN) 1 MG tablet; Take 1 tablet (1 mg total) by mouth at bedtime.  Bipolar depression ([place]) -     OLANZapine (ZYPREXA) 5 MG tablet; Take 1 tablet (5 mg total) by mouth at bedtime.     Please see After Visit Summary for patient specific instructions.  Future Appointments  Date Time Provider [place]  03/12/2020  3:00 PM Doree Fudge, PhD [facility] None  03/26/2020  3:00 PM Doree Fudge, PhD [facility] None  04/03/2020 10:30 AM [name], [place] [facility] None  04/09/2020  3:00 PM Doree Fudge, PhD [facility] None  05/07/2020  3:00 PM Doree Fudge, PhD [facility] None  06/04/2020  3:00 PM Doree Fudge, PhD [facility] None  07/02/2020  3:00 PM Doree Fudge, PhD [facility] None  07/30/2020  3:00 PM Doree Fudge, PhD [facility] None  08/27/2020  3:00 PM Doree Fudge, PhD [facility] None  09/24/2020  3:00 PM Doree Fudge, PhD [facility] None  09/29/2020  3:00 PM Cameron Sprang, MD [facility] None  10/22/2020  3:00 PM Doree Fudge, PhD [facility] None  11/19/2020  3:00 PM Doree Fudge, PhD [facility] None  12/17/2020  3:00 PM Doree Fudge, PhD [facility] None    No orders of the defined types were placed in this encounter.   -------------------------------

## 2020-03-07 ENCOUNTER — Telehealth: Payer: Self-pay

## 2020-03-07 ENCOUNTER — Ambulatory Visit
Admission: RE | Admit: 2020-03-07 | Discharge: 2020-03-07 | Disposition: A | Discharge status: Home / Self Care | Payer: Medicare Other | Source: Ambulatory Visit | Attending: Obstetrics and Gynecology | Admitting: Obstetrics and Gynecology

## 2020-03-07 ENCOUNTER — Ambulatory Visit: Payer: Medicare Other

## 2020-03-07 DIAGNOSIS — R928 Other abnormal and inconclusive findings on diagnostic imaging of breast: Secondary | ICD-10-CM | POA: Diagnosis not present

## 2020-03-07 NOTE — Telephone Encounter (Signed)
Patient was switched to Xanax XR 2 mg, which was not covered by her insurance. A prior authorization was submitted through cover my meds for ALPRAZOLAM XR 2 MG #60, with approval effective today 03/07/2020 through 08/22/2020 with [employer], ID# [id number], PA# [id number]   [facility] [address] [place] notified. Also left patient a message with information.

## 2020-03-10 ENCOUNTER — Other Ambulatory Visit: Payer: Self-pay | Admitting: Psychiatry

## 2020-03-10 DIAGNOSIS — F4310 Post-traumatic stress disorder, unspecified: Secondary | ICD-10-CM

## 2020-03-10 NOTE — Telephone Encounter (Signed)
We will need to RF her RX for Xanax XR to [employer] order. While she gets this filled and it gets shipped, she will need a 2 week supply sent to local [facility], [facility]. [facility] [address], [place], [place]

## 2020-03-11 MED ORDER — ALPRAZOLAM ER 2 MG PO TB24: 2.0000 mg | ORAL_TABLET | Freq: Two times a day (BID) | ORAL | 0 refills | Status: DC

## 2020-03-12 ENCOUNTER — Ambulatory Visit (INDEPENDENT_AMBULATORY_CARE_PROVIDER_SITE_OTHER): Payer: Medicare Other | Admitting: Psychology

## 2020-03-12 DIAGNOSIS — F449 Dissociative and conversion disorder, unspecified: Secondary | ICD-10-CM | POA: Diagnosis not present

## 2020-03-12 DIAGNOSIS — F3181 Bipolar II disorder: Secondary | ICD-10-CM | POA: Diagnosis not present

## 2020-03-26 ENCOUNTER — Ambulatory Visit (INDEPENDENT_AMBULATORY_CARE_PROVIDER_SITE_OTHER): Payer: Medicare Other | Admitting: Psychology

## 2020-03-26 DIAGNOSIS — F449 Dissociative and conversion disorder, unspecified: Secondary | ICD-10-CM | POA: Diagnosis not present

## 2020-03-26 DIAGNOSIS — F3181 Bipolar II disorder: Secondary | ICD-10-CM | POA: Diagnosis not present

## 2020-04-02 ENCOUNTER — Other Ambulatory Visit: Payer: Self-pay | Admitting: Psychiatry

## 2020-04-02 DIAGNOSIS — F4310 Post-traumatic stress disorder, unspecified: Secondary | ICD-10-CM

## 2020-04-03 ENCOUNTER — Encounter: Payer: Self-pay | Admitting: Psychiatry

## 2020-04-03 ENCOUNTER — Other Ambulatory Visit: Payer: Self-pay

## 2020-04-03 ENCOUNTER — Ambulatory Visit (INDEPENDENT_AMBULATORY_CARE_PROVIDER_SITE_OTHER): Payer: Medicare Other | Admitting: Psychiatry

## 2020-04-03 DIAGNOSIS — F319 Bipolar disorder, unspecified: Secondary | ICD-10-CM

## 2020-04-03 DIAGNOSIS — F4310 Post-traumatic stress disorder, unspecified: Secondary | ICD-10-CM

## 2020-04-03 DIAGNOSIS — G40309 Generalized idiopathic epilepsy and epileptic syndromes, not intractable, without status epilepticus: Secondary | ICD-10-CM | POA: Diagnosis not present

## 2020-04-03 MED ORDER — OLANZAPINE 5 MG PO TABS: 5.0000 mg | ORAL_TABLET | Freq: Every day | ORAL | 0 refills | Status: DC

## 2020-04-03 MED ORDER — ALPRAZOLAM ER 2 MG PO TB24: 2.0000 mg | ORAL_TABLET | Freq: Two times a day (BID) | ORAL | 0 refills | Status: DC

## 2020-04-03 MED ORDER — CARBAMAZEPINE ER 300 MG PO CP12: ORAL_CAPSULE | ORAL | 0 refills | Status: DC

## 2020-04-03 NOTE — Progress Notes (Signed)
Theresa [name] [id number] 1956-06-19 64 y.o.  Subjective:   Patient ID:  Theresa [name] is a 64 y.o. (DOB 1956-03-02) female.  Chief Complaint:  Chief Complaint  Patient presents with  . Follow-up    h/o anxiety, mood disturbance, insomnia    HPI Theresa [name] presents to the office today for follow-up of mood disturbance, anxiety, and insomnia. She is accompanied by her husband. She reports that Xanax XR has significantly reduced her anxiety. She reports that she now only experiences anxiety in response to certain triggers, such as anticipatory anxiety about therapy visits. Has started EMDR. She notices some intrusive memories with trauma work and has been reading "The Body Keeps the Score." Denies nightmares and reports that she has woken up "thinking about things." Anxiety in general has been improved.  Mood has fluctuated some. Denies any manic s/s. Denies excessive energy. She has had some periods of depression. She reports that she had SI for 1.5 weeks. She reports that she took Xanax IR 1 mg once and this relieved intrusive thoughts of suicide- "obsessive thinking." She has not needed to take it again. Has had some nights of decreased sleep. Pt reports that sleep has been better overall with Xanax XR.  Husband reports that she has long-standing pattern of waking up around 1-2 am when parents would awaken her to clean. She reports energy is lower after health related issues. Motivation is ok. Difficulty with concentration. Denies current SI.   She reports that increased dose in Austedo has improved TD slightly. She reports that she has had less LE movements. She reports that she can sit still for some periods of time if she sits on her leg. Has been practicing sitting still. She reports that her tongue continues to have some involuntary movements. Reports that she feels "spastic" when vacuuming. Continues to feel like she has to stretch out to get relief.   They have 5  grandchildren.  Past Psychiatric Medication Trials: Olanzapine Seroquel Saphris Depakote-self-injurious behavior Lamictal Keppra Carbamazepine Xanax Klonopin Ambien Trazodone Benztropine Ingrezza Propranolol- Ineffective   AIMS     Office Visit from 04/03/2020 in [place] Visit from 03/06/2020 in [place] Visit from 06/26/2019 in [place] Visit from 04/26/2019 in [place] Total Score 14 19 9 18     Mini-Mental     Office Visit from 02/28/2017 in [facility]  Total Score (max 30 points ) 29    PHQ2-9     Office Visit from 02/28/2017 in [facility] Visit from 09/19/2015 in [facility] at [place] from 06/05/2015 in [place] at [place] from 03/10/2015 in [facility] at [facility] Total Score 0 0 0 0       Review of Systems:  Review of Systems  Musculoskeletal: Negative for gait problem.  Neurological: Negative for seizures.  Psychiatric/Behavioral:       Please refer to HPI    Medications: I have reviewed the patient's current medications.  Current Outpatient Medications  Medication Sig Dispense Refill  . ALPRAZolam (XANAX XR) 2 MG 24 hr tablet Take 1 tablet (2 mg total) by mouth in the morning and at bedtime. 180 tablet 0  . carbamazepine (CARBATROL) 300 MG 12 hr capsule Take 1 capsule (300 mg total) by mouth every morning AND 2 capsules (600 mg total) at bedtime. 270 capsule 0  .  cetirizine (ZYRTEC) 10 MG tablet Take 10 mg by mouth daily.    . clonazePAM (KLONOPIN) 1 MG tablet Take 1 tablet (1 mg total) by mouth at bedtime. 30 tablet 2  . Deutetrabenazine (AUSTEDO) 12 MG TABS Take 1 tablet po BID with a 9 mg tablet to equal 21 mg po BID 60 tablet 5  . Deutetrabenazine (AUSTEDO) 9 MG TABS Take 1 tab po BID with a 12 mg tab to equal 21 mg BID 60 tablet 5   . docusate sodium (COLACE) 100 MG capsule Take 100 mg by mouth daily as needed for mild constipation.    . fluticasone (FLONASE) 50 MCG/ACT nasal spray Place into both nostrils daily.    Marland Kitchen levETIRAcetam (KEPPRA) 500 MG tablet Take 1 tablet (500 mg total) by mouth 2 (two) times daily. 180 tablet 3  . lovastatin (MEVACOR) 20 MG tablet TAKE 1 TABLET BY MOUTH  DAILY 90 tablet 3  . Melatonin 10 MG TABS Take 1 tablet by mouth as needed. Reported on 01/30/2016     . Multiple Vitamin (MULTIVITAMIN) tablet Take 1 tablet by mouth daily.    Marland Kitchen OLANZapine (ZYPREXA) 5 MG tablet Take 1 tablet (5 mg total) by mouth at bedtime. 90 tablet 0  . traZODone (DESYREL) 100 MG tablet TAKE 1 TO 2 TABLETS BY  MOUTH AT BEDTIME AS NEEDED  FOR SLEEP 180 tablet 3  . zolpidem (AMBIEN) 10 MG tablet TAKE 1 TABLET BY MOUTH AT  BEDTIME AS NEEDED 30 tablet 5  . bisacodyl (DULCOLAX) 5 MG EC tablet Take 5 mg by mouth daily as needed for moderate constipation. (Patient not taking: Reported on 02/19/2020)     No current facility-administered medications for this visit.    Medication Side Effects: None  Allergies:  Allergies  Allergen Reactions  . Lamictal [Lamotrigine]     Acute renal failure  . Ropinirole Nausea And Vomiting  . Codeine Swelling and Rash    Can take hydrocodone  . Penicillins     Abdominal pain    Past Medical History:  Diagnosis Date  . Adenomatous colon polyp   . Allergy   . Anxiety   . Bipolar 1 disorder ([place])   . Blood transfusion without reported diagnosis   . Depression   . Elevated LFTs   . Hepatic steatosis 05/07/13  . Hyperlipidemia   . Seizures ([place])   . Tardive dyskinesia     Family History  Problem Relation Age of Onset  . Colon polyps Mother   . Cancer Mother   . Heart disease Mother   . Alcoholism Mother   . Cancer Father   . Alcoholism Father   . Bipolar disorder Father   . Drug abuse Brother   . Bipolar disorder Other   . Colon cancer Neg Hx   . Stomach cancer Neg Hx   .  Breast cancer Neg Hx     Social History   Socioeconomic History  . Marital status: Married    Spouse name: Not on file  . Number of children: Not on file  . Years of education: Not on file  . Highest education level: Not on file  Occupational History  . Not on file  Tobacco Use  . Smoking status: Never Smoker  . Smokeless tobacco: Never Used  Vaping Use  . Vaping Use: Never used  Substance and Sexual Activity  . Alcohol use: Yes    Comment: occasionaly 1 a month  . Drug use: No  . Sexual activity:  Not Currently  Other Topics Concern  . 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 Difficulty of Paying Living Expenses: Not very hard  Food Insecurity: No Food Insecurity  . Worried About Charity fundraiser in the Last Year: Never true  . Ran Out of Food in the Last Year: Never true  Transportation Needs: No Transportation Needs  . Lack of Transportation (Medical): No  . Lack of Transportation (Non-Medical): No  Physical Activity:   . Days of Exercise per Week:   . Minutes of Exercise per Session:   Stress:   . Feeling of Stress :   Social Connections:   . Frequency of Communication with Friends and Family:   . Frequency of Social Gatherings with Friends and Family:   . Attends Religious Services:   . Active Member of Clubs or Organizations:   . Attends Archivist Meetings:   Marland Kitchen Marital Status:   Intimate Partner Violence: Not At Risk  . Fear of Current or Ex-Partner: No  . Emotionally Abused: No  . Physically Abused: No  . Sexually Abused: No    Past Medical History, Surgical history, Social history, and Family history were reviewed and updated as appropriate.   Please see review of systems for further details on the patient's review from today.   Objective:    Physical Exam:  BP (!) 127/92   Pulse 76   Physical Exam Constitutional:      General: She is not in acute distress. Musculoskeletal:        General: No deformity.  Neurological:     Mental Status: She is alert and oriented to person, place, and time.     Coordination: Coordination normal.  Psychiatric:        Attention and Perception: Attention and perception normal. She does not perceive auditory or visual hallucinations.        Mood and Affect: Mood normal. Mood is not anxious or depressed. Affect is not labile, blunt, angry or inappropriate.        Speech: Speech normal.        Behavior: Behavior normal.        Thought Content: Thought content normal. Thought content is not paranoid or delusional. Thought content does not include homicidal or suicidal ideation. Thought content does not include homicidal or suicidal plan.        Cognition and Memory: Cognition and memory normal.        Judgment: Judgment normal.     Comments: Insight intact     Lab Review:     Component Value Date/Time   NA 142 02/01/2019 1427   K 4.7 02/01/2019 1427   CL 105 02/01/2019 1427   CO2 28 02/01/2019 1427   GLUCOSE 94 02/01/2019 1427   BUN 13 02/01/2019 1427   CREATININE 0.96 02/01/2019 1427   CREATININE 0.91 09/19/2015 0959   CALCIUM 9.8 02/01/2019 1427   PROT 6.5 02/01/2019 1427   ALBUMIN 4.5 02/01/2019 1427   AST 31 02/01/2019 1427   ALT 54 (H) 02/01/2019 1427   ALKPHOS 128 (H) 02/01/2019 1427   BILITOT 0.4 02/01/2019 1427       Component Value Date/Time   WBC 5.4 02/01/2019 1427   RBC 5.17 (  H) 02/01/2019 1427   HGB 15.2 (H) 02/01/2019 1427   HCT 45.1 02/01/2019 1427   PLT 195.0 02/01/2019 1427   MCV 87.3 02/01/2019 1427   MCV 87.7 02/07/2013 1110   MCH 28.3 03/10/2015 0957   MCHC 33.8 02/01/2019 1427   RDW 13.3 02/01/2019 1427    No results found for: POCLITH, LITHIUM   Lab Results  Component Value Date   CBMZ 5.2 02/01/2019     .res Assessment: Plan:    Discussed continuing current plan of care at this time. Discussed using Xanax immediate release 1 mg only as needed for rare instances when she experiences intrusive thoughts of suicide and to notify office when this occurs.  Continue Xanax XR 2 mg BID. Continue Klonopin 1 mg at HS.  Continue Carbamazepine ER 300 mg po q am and 600 mg po QHS for mood disorder. Also used for seizure prevention. Continue Austedo 21 mg po BID for TD. Continue Ambien and Trazodone for insomnia.  Pt to f/u in 2 months or sooner if clinically indicated.  Recommend continuing therapy.  Patient advised to contact office with any questions, adverse effects, or acute worsening in signs and symptoms.  May was seen today for follow-up.  Diagnoses and all orders for this visit:  Generalized idiopathic epilepsy and epileptic syndromes, not intractable, without status epilepticus ([place]) -     carbamazepine (CARBATROL) 300 MG 12 hr capsule; Take 1 capsule (300 mg total) by mouth every morning AND 2 capsules (600 mg total) at bedtime.  Posttraumatic stress disorder -     ALPRAZolam (XANAX XR) 2 MG 24 hr tablet; Take 1 tablet (2 mg total) by mouth in the morning and at bedtime.  Bipolar depression ([place]) -     OLANZapine (ZYPREXA) 5 MG tablet; Take 1 tablet (5 mg total) by mouth at bedtime.     Please see After Visit Summary for patient specific instructions.  Future Appointments  Date Time Provider [place]  04/09/2020  3:00 PM Doree Fudge, PhD [facility] None  05/07/2020  3:00 PM Doree Fudge, PhD [facility] None  05/27/2020  1:30 PM [name], [place] [facility] None  06/04/2020  3:00 PM Doree Fudge, PhD [facility] None  07/02/2020  3:00 PM Doree Fudge, PhD [facility] None  07/30/2020  3:00 PM Doree Fudge, PhD [facility] None  08/27/2020  3:00 PM Doree Fudge, PhD [facility] None  09/24/2020  3:00 PM Doree Fudge, PhD [facility] None  09/29/2020  3:00 PM Cameron Sprang, MD [facility] None   10/22/2020  3:00 PM Doree Fudge, PhD [facility] None  11/19/2020  3:00 PM Doree Fudge, PhD [facility] None  12/17/2020  3:00 PM Doree Fudge, PhD [facility] None    No orders of the defined types were placed in this encounter.   -------------------------------

## 2020-04-03 NOTE — Progress Notes (Signed)
°   04/03/20 1103  Facial and Oral Movements  Muscles of Facial Expression 2  Lips and Perioral Area 2  Jaw 1  Tongue 1  Extremity Movements  Upper (arms, wrists, hands, fingers) 0  Lower (legs, knees, ankles, toes) 1  Trunk Movements  Neck, shoulders, hips 0  Overall Severity  Severity of abnormal movements (highest score from questions above) 2  Incapacitation due to abnormal movements 2  Patient's awareness of abnormal movements (rate only patient's report) 3  AIMS Total Score  AIMS Total Score 14

## 2020-04-03 NOTE — Telephone Encounter (Signed)
Has apt today 08/12

## 2020-04-09 ENCOUNTER — Ambulatory Visit (INDEPENDENT_AMBULATORY_CARE_PROVIDER_SITE_OTHER): Payer: Medicare Other | Admitting: Psychology

## 2020-04-09 ENCOUNTER — Ambulatory Visit: Payer: Medicare Other | Admitting: Psychology

## 2020-04-09 DIAGNOSIS — F449 Dissociative and conversion disorder, unspecified: Secondary | ICD-10-CM | POA: Diagnosis not present

## 2020-04-09 DIAGNOSIS — F3181 Bipolar II disorder: Secondary | ICD-10-CM | POA: Diagnosis not present

## 2020-04-13 ENCOUNTER — Other Ambulatory Visit: Payer: Self-pay | Admitting: Psychiatry

## 2020-04-13 DIAGNOSIS — F319 Bipolar disorder, unspecified: Secondary | ICD-10-CM

## 2020-04-14 ENCOUNTER — Other Ambulatory Visit: Payer: Self-pay | Admitting: Psychiatry

## 2020-04-14 DIAGNOSIS — F319 Bipolar disorder, unspecified: Secondary | ICD-10-CM

## 2020-04-23 ENCOUNTER — Ambulatory Visit (INDEPENDENT_AMBULATORY_CARE_PROVIDER_SITE_OTHER): Payer: Medicare Other | Admitting: Psychology

## 2020-04-23 DIAGNOSIS — F3181 Bipolar II disorder: Secondary | ICD-10-CM | POA: Diagnosis not present

## 2020-04-23 DIAGNOSIS — F449 Dissociative and conversion disorder, unspecified: Secondary | ICD-10-CM | POA: Diagnosis not present

## 2020-04-30 ENCOUNTER — Other Ambulatory Visit: Payer: Self-pay | Admitting: Psychiatry

## 2020-04-30 DIAGNOSIS — F319 Bipolar disorder, unspecified: Secondary | ICD-10-CM

## 2020-05-05 DIAGNOSIS — E785 Hyperlipidemia, unspecified: Secondary | ICD-10-CM | POA: Insufficient documentation

## 2020-05-05 NOTE — Patient Instructions (Signed)
It was great to see you again today, I will be in touch with your labs as soon as possible Please schedule your 2nd shingles vaccine on the way out Please see me in 6-12 months and take care! Work on losing a few pounds gradually through diet and exercise    Health Maintenance, Female Adopting a healthy lifestyle and getting preventive care are important in promoting health and wellness. Ask your health care provider about:  The right schedule for you to have regular tests and exams.  Things you can do on your own to prevent diseases and keep yourself healthy. What should I know about diet, weight, and exercise? Eat a healthy diet   Eat a diet that includes plenty of vegetables, fruits, low-fat dairy products, and lean protein.  Do not eat a lot of foods that are high in solid fats, added sugars, or sodium. Maintain a healthy weight Body mass index (BMI) is used to identify weight problems. It estimates body fat based on height and weight. Your health care provider can help determine your BMI and help you achieve or maintain a healthy weight. Get regular exercise Get regular exercise. This is one of the most important things you can do for your health. Most adults should:  Exercise for at least 150 minutes each week. The exercise should increase your heart rate and make you sweat (moderate-intensity exercise).  Do strengthening exercises at least twice a week. This is in addition to the moderate-intensity exercise.  Spend less time sitting. Even light physical activity can be beneficial. Watch cholesterol and blood lipids Have your blood tested for lipids and cholesterol at 64 years of age, then have this test every 5 years. Have your cholesterol levels checked more often if:  Your lipid or cholesterol levels are high.  You are older than 64 years of age.  You are at high risk for heart disease. What should I know about cancer screening? Depending on your health history and  family history, you may need to have cancer screening at various ages. This may include screening for:  Breast cancer.  Cervical cancer.  Colorectal cancer.  Skin cancer.  Lung cancer. What should I know about heart disease, diabetes, and high blood pressure? Blood pressure and heart disease  High blood pressure causes heart disease and increases the risk of stroke. This is more likely to develop in people who have high blood pressure readings, are of African descent, or are overweight.  Have your blood pressure checked: ? Every 3-5 years if you are 81-44 years of age. ? Every year if you are 4 years old or older. Diabetes Have regular diabetes screenings. This checks your fasting blood sugar level. Have the screening done:  Once every three years after age 46 if you are at a normal weight and have a low risk for diabetes.  More often and at a younger age if you are overweight or have a high risk for diabetes. What should I know about preventing infection? Hepatitis B If you have a higher risk for hepatitis B, you should be screened for this virus. Talk with your health care provider to find out if you are at risk for hepatitis B infection. Hepatitis C Testing is recommended for:  Everyone born from [birth year] through 1965.  Anyone with known risk factors for hepatitis C. Sexually transmitted infections (STIs)  Get screened for STIs, including gonorrhea and chlamydia, if: ? You are sexually active and are younger than 64 years of age. ?  You are older than 64 years of age and your health care provider tells you that you are at risk for this type of infection. ? Your sexual activity has changed since you were last screened, and you are at increased risk for chlamydia or gonorrhea. Ask your health care provider if you are at risk.  Ask your health care provider about whether you are at high risk for HIV. Your health care provider may recommend a prescription medicine to help prevent  HIV infection. If you choose to take medicine to prevent HIV, you should first get tested for HIV. You should then be tested every 3 months for as long as you are taking the medicine. Pregnancy  If you are about to stop having your period (premenopausal) and you may become pregnant, seek counseling before you get pregnant.  Take 400 to 800 micrograms (mcg) of folic acid every day if you become pregnant.  Ask for birth control (contraception) if you want to prevent pregnancy. Osteoporosis and menopause Osteoporosis is a disease in which the bones lose minerals and strength with aging. This can result in bone fractures. If you are 53 years old or older, or if you are at risk for osteoporosis and fractures, ask your health care provider if you should:  Be screened for bone loss.  Take a calcium or vitamin D supplement to lower your risk of fractures.  Be given hormone replacement therapy (HRT) to treat symptoms of menopause. Follow these instructions at home: Lifestyle  Do not use any products that contain nicotine or tobacco, such as cigarettes, e-cigarettes, and chewing tobacco. If you need help quitting, ask your health care provider.  Do not use street drugs.  Do not share needles.  Ask your health care provider for help if you need support or information about quitting drugs. Alcohol use  Do not drink alcohol if: ? Your health care provider tells you not to drink. ? You are pregnant, may be pregnant, or are planning to become pregnant.  If you drink alcohol: ? Limit how much you use to 0-1 drink a day. ? Limit intake if you are breastfeeding.  Be aware of how much alcohol is in your drink. In the [place] one drink equals one 12 oz bottle of beer (355 mL), one 5 oz glass of wine (148 mL), or one 1 oz glass of hard liquor (44 mL). General instructions  Schedule regular health, dental, and eye exams.  Stay current with your vaccines.  Tell your health care provider if: ? You  often feel depressed. ? You have ever been abused or do not feel safe at home. Summary  Adopting a healthy lifestyle and getting preventive care are important in promoting health and wellness.  Follow your health care provider's instructions about healthy diet, exercising, and getting tested or screened for diseases.  Follow your health care provider's instructions on monitoring your cholesterol and blood pressure. This information is not intended to replace advice given to you by your health care provider. Make sure you discuss any questions you have with your health care provider. Document Revised: 08/02/2018 Document Reviewed: 08/02/2018 Elsevier Patient Education  2020 [employer].

## 2020-05-05 NOTE — Progress Notes (Addendum)
[facility] [address], [place], [place] [address] [phone]  Date:  05/08/2020   Name:  Theresa [name]   DOB:  1955-09-16   MRN:  [id number]  PCP:  Darreld Mclean, MD    Chief Complaint: Annual Exam   History of Present Illness:  Theresa [name] is a 64 y.o. very pleasant female patient who presents with the following:  Patient here today for follow-up visit/physical exam.  History of bipolar disorder and epilepsy, tardive dyskinesia, dyslipidemia Last seen by myself about 1 year ago  Her psychiatry care is with Desmond Dike, she sees [name].  Follows up regularly Neurology is Dr. Delice Lesch She had to increase keppra dose due to a seizure that occurred in May.  Pt admits she had not ben sleeping much as she was going through a manic spell They also added [place] for TD- this helping to decrease her sx  She underwent cervical spine surgery February 2020 When I saw her last year, Theresa [name] had concern of decreased exercise tolerance.  We had planned for a treadmill test, but she did not end up getting this done.  She reports that her symptoms resolved and she decided not to go through the treadmill Pt notes that her sx have now resolved- she is working on walking more for exercise to increase her fitness level   Flu vaccine- will do today Mammogram up-to-date Pap up-to-date-however, patient had positive HPV September 2020- ? [facility].  Pt states she has follow-up arranged Colon cancer screening up-to-date COVID-19 series- done  Shingrix- would like to start this today  Needs routine lab work today DEXA scan- can start next year  Patient Active Problem List   Diagnosis Date Noted  . Dyslipidemia 05/05/2020  . Manic bipolar I disorder in partial remission ([place]) 07/25/2018  . Posttraumatic stress disorder 07/25/2018  . Generalized idiopathic epilepsy and epileptic syndromes, not intractable, without status  epilepticus ([place]) 06/24/2017  . Tardive dyskinesia 01/21/2017  . Special screening for malignant neoplasms, colon 06/16/2012  . Personal history of colonic polyps 06/16/2012  . Bipolar disorder ([place]) 04/02/2012  . Idiopathic generalized epilepsy ([place]) 10/15/2011    Past Medical History:  Diagnosis Date  . Adenomatous colon polyp   . Allergy   . Anxiety   . Bipolar 1 disorder ([place])   . Blood transfusion without reported diagnosis   . Depression   . Elevated LFTs   . Hepatic steatosis 05/07/13  . Hyperlipidemia   . Seizures ([place])   . Tardive dyskinesia     Past Surgical History:  Procedure Laterality Date  . APPENDECTOMY    . COLONOSCOPY  06/16/2012   Procedure: COLONOSCOPY;  Surgeon: Lafayette Dragon, MD;  Location: [facility] END[facility];  Service: Endoscopy;  Laterality: N/A;  . DILATION AND CURETTAGE OF UTERUS    . KNEE ARTHROSCOPY WITH MENISCAL REPAIR Left   . NECK SURGERY      Social History   Tobacco Use  . Smoking status: Never Smoker  . Smokeless tobacco: Never Used  Vaping Use  . Vaping Use: Never used  Substance Use Topics  . Alcohol use: Yes    Comment: occasionaly 1 a month  . Drug use: No    Family History  Problem Relation Age of Onset  . Colon polyps Mother   . Cancer Mother   . Heart disease Mother   . Alcoholism Mother   . Cancer Father   .  Alcoholism Father   . Bipolar disorder Father   . Drug abuse Brother   . Bipolar disorder Other   . Colon cancer Neg Hx   . Stomach cancer Neg Hx   . Breast cancer Neg Hx     Allergies  Allergen Reactions  . Lamictal [Lamotrigine]     Acute renal failure  . Ropinirole Nausea And Vomiting  . Codeine Swelling and Rash    Can take hydrocodone  . Penicillins     Abdominal pain    Medication list has been reviewed and updated.  Current Outpatient Medications on File Prior to Visit  Medication Sig Dispense Refill  . ALPRAZolam (XANAX XR) 2 MG 24 hr tablet Take 1 tablet (2 mg total) by mouth in the morning  and at bedtime. 180 tablet 0  . carbamazepine (CARBATROL) 300 MG 12 hr capsule Take 1 capsule (300 mg total) by mouth every morning AND 2 capsules (600 mg total) at bedtime. 270 capsule 0  . cetirizine (ZYRTEC) 10 MG tablet Take 10 mg by mouth daily.    . clonazePAM (KLONOPIN) 1 MG tablet Take 1 tablet (1 mg total) by mouth at bedtime. 30 tablet 2  . Deutetrabenazine (AUSTEDO) 12 MG TABS Take 1 tablet po BID with a 9 mg tablet to equal 21 mg po BID 60 tablet 5  . Deutetrabenazine (AUSTEDO) 9 MG TABS Take 1 tab po BID with a 12 mg tab to equal 21 mg BID 60 tablet 5  . fluticasone (FLONASE) 50 MCG/ACT nasal spray Place into both nostrils daily.    Marland Kitchen levETIRAcetam (KEPPRA) 500 MG tablet Take 1 tablet (500 mg total) by mouth 2 (two) times daily. 180 tablet 3  . lovastatin (MEVACOR) 20 MG tablet TAKE 1 TABLET BY MOUTH  DAILY 90 tablet 3  . Melatonin 10 MG TABS Take 1 tablet by mouth as needed. Reported on 01/30/2016     . OLANZapine (ZYPREXA) 5 MG tablet Take 1 tablet (5 mg total) by mouth at bedtime. 90 tablet 0  . traZODone (DESYREL) 100 MG tablet TAKE 1 TO 2 TABLETS BY  MOUTH AT BEDTIME AS NEEDED  FOR SLEEP 180 tablet 3  . zolpidem (AMBIEN) 10 MG tablet TAKE 1 TABLET BY MOUTH AT  BEDTIME AS NEEDED 30 tablet 5   No current facility-administered medications on file prior to visit.    Review of Systems:  As per HPI- otherwise negative.   Physical Examination: Vitals:   05/08/20 0947  BP: 120/82  Pulse: 76  Resp: 16  SpO2: 97%   Vitals:   05/08/20 0947  Weight: 173 lb (78.5 kg)  Height: 5\' 6"  (1.676 m)   Body mass index is 27.92 kg/m. Ideal Body Weight: Weight in (lb) to have BMI = 25: 154.6  GEN: no acute distress. HEENT: Atraumatic, Normocephalic.  Ears and Nose: No external deformity. CV: RRR, No M/G/R. No JVD. No thrill. No extra heart sounds. PULM: CTA B, no wheezes, crackles, rhonchi. No retractions. No resp. distress. No accessory muscle use. ABD: S, NT, ND, +BS. No  rebound. No HSM. EXTR: No c/c/e PSYCH: Normally interactive. Conversant.   Wt Readings from Last 3 Encounters:  05/08/20 173 lb (78.5 kg)  09/26/19 157 lb (71.2 kg)  02/19/19 151 lb (68.5 kg)   She has gained some weight, she is trying to eat better and exercise more.    Assessment and Plan: Physical exam  Screening for hyperlipidemia - Plan: Lipid panel  Screening for diabetes mellitus -  Plan: Comprehensive metabolic panel, Hemoglobin A1c  Screening for thyroid disorder - Plan: TSH  Screening for deficiency anemia - Plan: CBC  Encounter for vitamin deficiency screening - Plan: VITAMIN D 25 Hydroxy (Vit-D Deficiency, Fractures)  Dyslipidemia  Medication monitoring encounter - Plan: Levetiracetam level  Immunization due - Plan: Varicella-zoster vaccine IM (Shingrix) Patient today for physical exam Flu shot, first dose of shingles vaccine given Patient does have history of seizure disorder, bipolar disorder, tardive dyskinesia.  She is followed by psychiatry as well as neurology.  She requests a Keppra level today which we are glad to draw She has gained a bit of weight recently, medication changes and aging likely causes.  Encouraged her to work on exercise and watch her diet, try to gradually get back to her baseline Her exercise intolerance symptoms have resolved Will plan further follow- up pending labs.  This visit occurred during the SARS-CoV-2 public health emergency.  Safety protocols were in place, including screening questions prior to the visit, additional usage of staff PPE, and extensive cleaning of exam room while observing appropriate contact time as indicated for disinfecting solutions.    Signed Lamar Blinks, MD  Received his labs 9/17- message to pt  Results for orders placed or performed in visit on 05/08/20  CBC  Result Value Ref Range   WBC 4.8 3.8 - 10.8 Thousand/uL   RBC 4.94 3.80 - 5.10 Million/uL   Hemoglobin 14.7 11.7 - 15.5 g/dL   HCT 42.8  35 - 45 %   MCV 86.6 80.0 - 100.0 fL   MCH 29.8 27.0 - 33.0 pg   MCHC 34.3 32.0 - 36.0 g/dL   RDW 12.8 11.0 - 15.0 %   Platelets 188 140 - 400 Thousand/uL   MPV 9.9 7.5 - 12.5 fL  Comprehensive metabolic panel  Result Value Ref Range   Glucose, Bld 93 65 - 99 mg/dL   BUN 11 7 - 25 mg/dL   Creat 0.79 0.50 - 0.99 mg/dL   BUN/Creatinine Ratio NOT APPLICABLE 6 - 22 (calc)   Sodium 130 (L) 135 - 146 mmol/L   Potassium 4.6 3.5 - 5.3 mmol/L   Chloride 93 (L) 98 - 110 mmol/L   CO2 29 20 - 32 mmol/L   Calcium 9.7 8.6 - 10.4 mg/dL   Total Protein 6.7 6.1 - 8.1 g/dL   Albumin 4.5 3.6 - 5.1 g/dL   Globulin 2.2 1.9 - 3.7 g/dL (calc)   AG Ratio 2.0 1.0 - 2.5 (calc)   Total Bilirubin 0.5 0.2 - 1.2 mg/dL   Alkaline phosphatase (APISO) 102 37 - 153 U/L   AST 23 10 - 35 U/L   ALT 31 (H) 6 - 29 U/L  Hemoglobin A1c  Result Value Ref Range   Hgb A1c MFr Bld 5.4 <5.7 % of total Hgb   Mean Plasma Glucose 108 (calc)   eAG (mmol/L) 6.0 (calc)  Lipid panel  Result Value Ref Range   Cholesterol 211 (H) <200 mg/dL   HDL 76 > OR = 50 mg/dL   Triglycerides 149 <150 mg/dL   LDL Cholesterol (Calc) 109 (H) mg/dL (calc)   Total CHOL/HDL Ratio 2.8 <5.0 (calc)   Non-HDL Cholesterol (Calc) 135 (H) <130 mg/dL (calc)  TSH  Result Value Ref Range   TSH 1.94 0.40 - 4.50 mIU/L  VITAMIN D 25 Hydroxy (Vit-D Deficiency, Fractures)  Result Value Ref Range   Vit D, 25-Hydroxy 28 (L) 30 - 100 ng/mL    Blood counts are normal  Your metabolic profile is normal except for low sodium- this may be related to some of your medications.  Your sodium is not so low as to be worrisome at this time, but we need to monitor it.  I would suggest rechecking this in about a month- I will place an order if you would like to schedule a lab visit only Your A1c is normal- no sign of diabetes Cholesterol looks good Thyroid normal Vit D is minimally low- no cause for alarm, I would suggest taking 2,000 iu of vitamin D daily if you are  not already   addnd 9/20- received her keppra level.  Will send to Theresa [name] for her

## 2020-05-06 ENCOUNTER — Encounter: Payer: Self-pay | Admitting: Family Medicine

## 2020-05-07 ENCOUNTER — Ambulatory Visit (INDEPENDENT_AMBULATORY_CARE_PROVIDER_SITE_OTHER): Payer: Medicare Other | Admitting: Psychology

## 2020-05-07 DIAGNOSIS — F4481 Dissociative identity disorder: Secondary | ICD-10-CM

## 2020-05-07 DIAGNOSIS — F3181 Bipolar II disorder: Secondary | ICD-10-CM

## 2020-05-08 ENCOUNTER — Encounter: Payer: Self-pay | Admitting: Family Medicine

## 2020-05-08 ENCOUNTER — Ambulatory Visit (INDEPENDENT_AMBULATORY_CARE_PROVIDER_SITE_OTHER): Payer: Medicare Other | Admitting: Family Medicine

## 2020-05-08 ENCOUNTER — Other Ambulatory Visit: Payer: Self-pay

## 2020-05-08 VITALS — BP 120/82 | HR 76 | Resp 16 | Ht 66.0 in | Wt 173.0 lb

## 2020-05-08 DIAGNOSIS — Z23 Encounter for immunization: Secondary | ICD-10-CM

## 2020-05-08 DIAGNOSIS — Z13220 Encounter for screening for lipoid disorders: Secondary | ICD-10-CM

## 2020-05-08 DIAGNOSIS — Z5181 Encounter for therapeutic drug level monitoring: Secondary | ICD-10-CM

## 2020-05-08 DIAGNOSIS — Z1321 Encounter for screening for nutritional disorder: Secondary | ICD-10-CM

## 2020-05-08 DIAGNOSIS — Z0000 Encounter for general adult medical examination without abnormal findings: Secondary | ICD-10-CM

## 2020-05-08 DIAGNOSIS — E871 Hypo-osmolality and hyponatremia: Secondary | ICD-10-CM

## 2020-05-08 DIAGNOSIS — Z130 Encounter for screening for diseases of the blood and blood-forming organs and certain disorders involving the immune mechanism: Secondary | ICD-10-CM | POA: Diagnosis not present

## 2020-05-08 DIAGNOSIS — E785 Hyperlipidemia, unspecified: Secondary | ICD-10-CM

## 2020-05-08 DIAGNOSIS — Z131 Encounter for screening for diabetes mellitus: Secondary | ICD-10-CM | POA: Diagnosis not present

## 2020-05-08 DIAGNOSIS — Z1329 Encounter for screening for other suspected endocrine disorder: Secondary | ICD-10-CM

## 2020-05-08 DIAGNOSIS — R5383 Other fatigue: Secondary | ICD-10-CM | POA: Diagnosis not present

## 2020-05-09 ENCOUNTER — Encounter: Payer: Self-pay | Admitting: Family Medicine

## 2020-05-09 NOTE — Addendum Note (Signed)
Addended by: Lamar Blinks C on: 05/09/2020 07:25 AM   Modules accepted: Orders

## 2020-05-12 LAB — COMPREHENSIVE METABOLIC PANEL
AG Ratio: 2 (calc) (ref 1.0–2.5)
ALT: 31 U/L — ABNORMAL HIGH (ref 6–29)
AST: 23 U/L (ref 10–35)
Albumin: 4.5 g/dL (ref 3.6–5.1)
Alkaline phosphatase (APISO): 102 U/L (ref 37–153)
BUN: 11 mg/dL (ref 7–25)
CO2: 29 mmol/L (ref 20–32)
Calcium: 9.7 mg/dL (ref 8.6–10.4)
Chloride: 93 mmol/L — ABNORMAL LOW (ref 98–110)
Creat: 0.79 mg/dL (ref 0.50–0.99)
Globulin: 2.2 g/dL (calc) (ref 1.9–3.7)
Glucose, Bld: 93 mg/dL (ref 65–99)
Potassium: 4.6 mmol/L (ref 3.5–5.3)
Sodium: 130 mmol/L — ABNORMAL LOW (ref 135–146)
Total Bilirubin: 0.5 mg/dL (ref 0.2–1.2)
Total Protein: 6.7 g/dL (ref 6.1–8.1)

## 2020-05-12 LAB — HEMOGLOBIN A1C
Hgb A1c MFr Bld: 5.4 % of total Hgb (ref <5.7)
Mean Plasma Glucose: 108 (calc)
eAG (mmol/L): 6 (calc)

## 2020-05-12 LAB — TSH: TSH: 1.94 mIU/L (ref 0.40–4.50)

## 2020-05-12 LAB — LIPID PANEL
Cholesterol: 211 mg/dL — ABNORMAL HIGH (ref <200)
HDL: 76 mg/dL (ref >=50)
LDL Cholesterol (Calc): 109 mg/dL (calc) — ABNORMAL HIGH
Non-HDL Cholesterol (Calc): 135 mg/dL (calc) — ABNORMAL HIGH (ref <130)
Total CHOL/HDL Ratio: 2.8 (calc) (ref <5.0)
Triglycerides: 149 mg/dL (ref <150)

## 2020-05-12 LAB — CBC
HCT: 42.8 % (ref 35.0–45.0)
Hemoglobin: 14.7 g/dL (ref 11.7–15.5)
MCH: 29.8 pg (ref 27.0–33.0)
MCHC: 34.3 g/dL (ref 32.0–36.0)
MCV: 86.6 fL (ref 80.0–100.0)
MPV: 9.9 fL (ref 7.5–12.5)
Platelets: 188 10*3/uL (ref 140–400)
RBC: 4.94 10*6/uL (ref 3.80–5.10)
RDW: 12.8 % (ref 11.0–15.0)
WBC: 4.8 10*3/uL (ref 3.8–10.8)

## 2020-05-12 LAB — LEVETIRACETAM LEVEL: Keppra (Levetiracetam): 17.6 ug/mL (ref 12.0–46.0)

## 2020-05-12 LAB — VITAMIN D 25 HYDROXY (VIT D DEFICIENCY, FRACTURES): Vit D, 25-Hydroxy: 28 ng/mL — ABNORMAL LOW (ref 30–100)

## 2020-05-15 DIAGNOSIS — H25813 Combined forms of age-related cataract, bilateral: Secondary | ICD-10-CM | POA: Diagnosis not present

## 2020-05-15 DIAGNOSIS — H31093 Other chorioretinal scars, bilateral: Secondary | ICD-10-CM | POA: Diagnosis not present

## 2020-05-15 DIAGNOSIS — H02834 Dermatochalasis of left upper eyelid: Secondary | ICD-10-CM | POA: Diagnosis not present

## 2020-05-15 DIAGNOSIS — H40033 Anatomical narrow angle, bilateral: Secondary | ICD-10-CM | POA: Diagnosis not present

## 2020-05-15 DIAGNOSIS — H02831 Dermatochalasis of right upper eyelid: Secondary | ICD-10-CM | POA: Diagnosis not present

## 2020-05-19 DIAGNOSIS — Z9189 Other specified personal risk factors, not elsewhere classified: Secondary | ICD-10-CM | POA: Diagnosis not present

## 2020-05-21 ENCOUNTER — Ambulatory Visit (INDEPENDENT_AMBULATORY_CARE_PROVIDER_SITE_OTHER): Payer: Medicare Other | Admitting: Psychology

## 2020-05-21 DIAGNOSIS — F449 Dissociative and conversion disorder, unspecified: Secondary | ICD-10-CM

## 2020-05-21 DIAGNOSIS — F3181 Bipolar II disorder: Secondary | ICD-10-CM | POA: Diagnosis not present

## 2020-05-27 ENCOUNTER — Encounter: Payer: Self-pay | Admitting: Psychiatry

## 2020-05-27 ENCOUNTER — Ambulatory Visit (INDEPENDENT_AMBULATORY_CARE_PROVIDER_SITE_OTHER): Payer: Medicare Other | Admitting: Psychiatry

## 2020-05-27 ENCOUNTER — Other Ambulatory Visit: Payer: Self-pay

## 2020-05-27 DIAGNOSIS — F5101 Primary insomnia: Secondary | ICD-10-CM | POA: Diagnosis not present

## 2020-05-27 DIAGNOSIS — F319 Bipolar disorder, unspecified: Secondary | ICD-10-CM | POA: Diagnosis not present

## 2020-05-27 DIAGNOSIS — G40309 Generalized idiopathic epilepsy and epileptic syndromes, not intractable, without status epilepticus: Secondary | ICD-10-CM

## 2020-05-27 MED ORDER — ZOLPIDEM TARTRATE 10 MG PO TABS: ORAL_TABLET | ORAL | 5 refills | Status: DC

## 2020-05-27 MED ORDER — OLANZAPINE 10 MG PO TABS: 10.0000 mg | ORAL_TABLET | Freq: Every day | ORAL | 0 refills | Status: DC

## 2020-05-27 MED ORDER — CARBAMAZEPINE ER 300 MG PO CP12: 600.0000 mg | ORAL_CAPSULE | Freq: Two times a day (BID) | ORAL | 0 refills | Status: DC

## 2020-05-27 NOTE — Progress Notes (Signed)
Theresa [name] [id number] 1956/07/10 64 y.o.  Subjective:   Patient ID:  Theresa [name] is a 64 y.o. (DOB 22-May-1956) female.  Chief Complaint:  Chief Complaint  Patient presents with  . Manic Behavior  . Sleeping Problem    HPI Theresa [name] presents to the office today for follow-up of mood disturbance, anxiety, and insomnia. She reports that she had period of sleeplessness for 4 days with no more than 2 hours sleep and increased Olanzapine to 10 mg QHS on 05/23/20. She is now sleeping about 8 hours a night. She reports that she has been having manic s/s for the last 1-2 weeks. She reports that she has been talking faster. Husband reports that she has difficulty staying still and has had worsening TD. Husband has noticed that her mood is manic and that her thoughts are sped up and she is in a hurry to do things.  He reports that she has been sending more emails and exhibiting increased impulsivity.Denies any risky behavior or excessive spending. She reports that she has had some racing thoughts. Some difficulty with concentration. She reports that she is better able to relax. She reports that she is rushing at times and it is difficult to sit and eat. She reports that she has had some energy. She reports that Xanax XR has been helpful for her anxiety. She reports that she is rarely taking Xanax IR prn. Plans to use Xanax IR prn prior to biopsy. Appetite has been ok. Denies SI.   She reports that she is excited about getting a new puppy today. Has been on a waiting list for puppy for 6 months. She reports difficulty taking care of multiple pets, such as remembering which dogs get which foods.   Was not able to paint recently due to TD.   Past Psychiatric Medication Trials: Olanzapine Seroquel Saphris Depakote-self-injurious behavior Lamictal Keppra Carbamazepine Xanax Klonopin Ambien Trazodone Benztropine Ingrezza Propranolol- Ineffective   AIMS     Office Visit from  04/03/2020 in [place] Visit from 03/06/2020 in [place] Visit from 06/26/2019 in [place] Visit from 04/26/2019 in [place] Total Score 14 19 9 18     Mini-Mental     Office Visit from 02/28/2017 in [facility]  Total Score (max 30 points ) 29    PHQ2-9     Office Visit from 02/28/2017 in [facility] [place] Visit from 09/19/2015 in Primary Care at [place] from 06/05/2015 in [place] at [place] from 03/10/2015 in Primary Care at [facility] Total Score 0 0 0 0       Review of Systems:  Review of Systems  Musculoskeletal: Negative for gait problem.  Neurological: Positive for tremors.  Psychiatric/Behavioral:       Please refer to HPI    Medications: I have reviewed the patient's current medications.  Current Outpatient Medications  Medication Sig Dispense Refill  . ALPRAZolam (XANAX XR) 2 MG 24 hr tablet Take 1 tablet (2 mg total) by mouth in the morning and at bedtime. 180 tablet 0  . carbamazepine (CARBATROL) 300 MG 12 hr capsule Take 2 capsules (600 mg total) by mouth 2 (two) times daily. 360 capsule 0  . cetirizine (ZYRTEC) 10 MG tablet Take 10 mg by mouth daily as needed.     . clonazePAM (KLONOPIN) 1 MG tablet Take 1  tablet (1 mg total) by mouth at bedtime. 30 tablet 2  . Deutetrabenazine (AUSTEDO) 12 MG TABS Take 1 tablet po BID with a 9 mg tablet to equal 21 mg po BID 60 tablet 5  . Deutetrabenazine (AUSTEDO) 9 MG TABS Take 1 tab po BID with a 12 mg tab to equal 21 mg BID 60 tablet 5  . fluticasone (FLONASE) 50 MCG/ACT nasal spray Place into both nostrils daily as needed.     . levETIRAcetam (KEPPRA) 500 MG tablet Take 1 tablet (500 mg total) by mouth 2 (two) times daily. 180 tablet 3  . lovastatin (MEVACOR) 20 MG tablet TAKE 1 TABLET BY MOUTH  DAILY 90 tablet 3  .  Melatonin 10 MG TABS Take 1 tablet by mouth as needed. Reported on 01/30/2016     . OLANZapine (ZYPREXA) 10 MG tablet Take 1 tablet (10 mg total) by mouth at bedtime. 30 tablet 0  . traZODone (DESYREL) 100 MG tablet TAKE 1 TO 2 TABLETS BY  MOUTH AT BEDTIME AS NEEDED  FOR SLEEP 180 tablet 3  . zolpidem (AMBIEN) 10 MG tablet TAKE 1 TABLET BY MOUTH AT  BEDTIME AS NEEDED 30 tablet 5   No current facility-administered medications for this visit.    Medication Side Effects: Other: TD  Allergies:  Allergies  Allergen Reactions  . Lamictal [Lamotrigine]     Acute renal failure  . Ropinirole Nausea And Vomiting  . Tramadol   . Codeine Swelling and Rash    Can take hydrocodone  . Penicillins     Abdominal pain    Past Medical History:  Diagnosis Date  . Adenomatous colon polyp   . Allergy   . Anxiety   . Bipolar 1 disorder ([place])   . Blood transfusion without reported diagnosis   . Depression   . Elevated LFTs   . Hepatic steatosis 05/07/13  . Hyperlipidemia   . Seizures ([place])   . Tardive dyskinesia     Family History  Problem Relation Age of Onset  . Colon polyps Mother   . Cancer Mother   . Heart disease Mother   . Alcoholism Mother   . Cancer Father   . Alcoholism Father   . Bipolar disorder Father   . Drug abuse Brother   . Bipolar disorder Other   . Colon cancer Neg Hx   . Stomach cancer Neg Hx   . Breast cancer Neg Hx     Social History   Socioeconomic History  . Marital status: Married    Spouse name: Not on file  . Number of children: Not on file  . Years of education: Not on file  . Highest education level: Not on file  Occupational History  . Not on file  Tobacco Use  . Smoking status: Never Smoker  . Smokeless tobacco: Never Used  Vaping Use  . Vaping Use: Never used  Substance and Sexual Activity  . Alcohol use: Yes    Comment: occasionaly 1 a month  . Drug use: No  . Sexual activity: Not Currently  Other Topics Concern  . 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   . Difficulty of Paying Living Expenses: Not very hard  Food Insecurity: No Food Insecurity  .  Worried About Charity fundraiser in the Last Year: Never true  . Ran Out of Food in the Last Year: Never true  Transportation Needs: No Transportation Needs  . Lack of Transportation (Medical): No  . Lack of Transportation (Non-Medical): No  Physical Activity:   . Days of Exercise per Week: Not on file  . Minutes of Exercise per Session: Not on file  Stress:   . Feeling of Stress : Not on file  Social Connections:   . Frequency of Communication with Friends and Family: Not on file  . Frequency of Social Gatherings with Friends and Family: Not on file  . Attends Religious Services: Not on file  . Active Member of Clubs or Organizations: Not on file  . Attends Archivist Meetings: Not on file  . Marital Status: Not on file  Intimate Partner Violence: Not At Risk  . Fear of Current or Ex-Partner: No  . Emotionally Abused: No  . Physically Abused: No  . Sexually Abused: No    Past Medical History, Surgical history, Social history, and Family history were reviewed and updated as appropriate.   Please see review of systems for further details on the patient's review from today.   Objective:   Physical Exam:  There were no vitals taken for this visit.  Physical Exam Constitutional:      General: She is not in acute distress. Musculoskeletal:        General: No deformity.  Neurological:     Mental Status: She is alert and oriented to person, place, and time.     Coordination: Coordination normal.  Psychiatric:        Attention and Perception: Attention and perception normal. She does not perceive auditory or visual hallucinations.        Mood and Affect: Mood  is elated. Mood is not anxious or depressed. Affect is not labile, blunt, angry or inappropriate.        Speech: Speech normal.        Behavior: Behavior is hyperactive. Behavior is cooperative.        Thought Content: Thought content normal. Thought content is not paranoid or delusional. Thought content does not include homicidal or suicidal ideation. Thought content does not include homicidal or suicidal plan.        Cognition and Memory: Cognition and memory normal.        Judgment: Judgment normal.     Comments: Insight intact     Lab Review:     Component Value Date/Time   NA 130 (L) 05/08/2020 1015   K 4.6 05/08/2020 1015   CL 93 (L) 05/08/2020 1015   CO2 29 05/08/2020 1015   GLUCOSE 93 05/08/2020 1015   BUN 11 05/08/2020 1015   CREATININE 0.79 05/08/2020 1015   CALCIUM 9.7 05/08/2020 1015   PROT 6.7 05/08/2020 1015   ALBUMIN 4.5 02/01/2019 1427   AST 23 05/08/2020 1015   ALT 31 (H) 05/08/2020 1015   ALKPHOS 128 (H) 02/01/2019 1427   BILITOT 0.5 05/08/2020 1015       Component Value Date/Time   WBC 4.8 05/08/2020 1015   RBC 4.94 05/08/2020 1015   HGB 14.7 05/08/2020 1015   HCT 42.8 05/08/2020 1015   PLT 188 05/08/2020 1015   MCV 86.6 05/08/2020 1015   MCV 87.7 02/07/2013 1110   MCH 29.8 05/08/2020 1015   MCHC 34.3 05/08/2020 1015   RDW 12.8 05/08/2020 1015    No results found for: POCLITH,  LITHIUM   Lab Results  Component Value Date   CBMZ 5.2 02/01/2019     .res Assessment: Plan:   Continue increased dose of Zyprexa 10 mg po QHS since more time is needed to become effective and sleep is beginning to stabilize.  Increase Carbamazepine ER to 600 mg po BID for mood stabilization.  Continue Klonopin 1 mg po QHS for insomnia.  Continue Xanax XR 2 mg BID for anxiety. Continue Austedo 21 mg po BID for TD. Continue Trazodone for insomnia.  Continue Ambien for insomnia.  Pt to follow-up in 2-3 weeks or sooner if clinically indicated. Patient advised to  contact office with any questions, adverse effects, or acute worsening in signs and symptoms.  Theresa [name] was seen today for manic behavior and sleeping problem.  Diagnoses and all orders for this visit:  Generalized idiopathic epilepsy and epileptic syndromes, not intractable, without status epilepticus ([place]) -     carbamazepine (CARBATROL) 300 MG 12 hr capsule; Take 2 capsules (600 mg total) by mouth 2 (two) times daily.  Bipolar depression ([place]) -     OLANZapine (ZYPREXA) 10 MG tablet; Take 1 tablet (10 mg total) by mouth at bedtime.  Primary insomnia -     zolpidem (AMBIEN) 10 MG tablet; TAKE 1 TABLET BY MOUTH AT  BEDTIME AS NEEDED     Please see After Visit Summary for patient specific instructions.  Future Appointments  Date Time Provider [place]  06/04/2020  3:00 PM Doree Fudge, PhD [facility] None  06/16/2020 12:00 PM [name], [place] CP-CP None  07/02/2020  3:00 PM Doree Fudge, PhD [facility] None  07/10/2020 11:15 AM [facility] NURSE [facility]  07/30/2020  3:00 PM Doree Fudge, PhD [facility] None  08/27/2020  3:00 PM Doree Fudge, PhD [facility] None  09/24/2020  3:00 PM Doree Fudge, PhD [facility] None  09/29/2020  3:00 PM Cameron Sprang, MD [facility] None  10/22/2020  3:00 PM Doree Fudge, PhD [facility] None  11/19/2020  3:00 PM Doree Fudge, PhD [facility] None  12/17/2020  3:00 PM Doree Fudge, PhD [facility] None    No orders of the defined types were placed in this encounter.   -------------------------------

## 2020-05-29 ENCOUNTER — Telehealth: Payer: Self-pay | Admitting: Psychiatry

## 2020-05-29 NOTE — Telephone Encounter (Signed)
Pt called stating med increase on 10/6. She did not sleep last night and woke up very dizzy. Return call @ [phone]

## 2020-05-29 NOTE — Telephone Encounter (Signed)
Rtc to patient and she agrees to give it more time. Reports she felt dizzy most of the day. Advised her to call back tomorrow if anything worsens. She feels she should be able to sleep tonight with feeling tired now.

## 2020-05-29 NOTE — Telephone Encounter (Signed)
Please review

## 2020-06-03 ENCOUNTER — Other Ambulatory Visit: Payer: Self-pay | Admitting: Psychiatry

## 2020-06-03 DIAGNOSIS — F4310 Post-traumatic stress disorder, unspecified: Secondary | ICD-10-CM

## 2020-06-04 ENCOUNTER — Ambulatory Visit (INDEPENDENT_AMBULATORY_CARE_PROVIDER_SITE_OTHER): Payer: Medicare Other | Admitting: Psychology

## 2020-06-04 ENCOUNTER — Telehealth: Payer: Self-pay | Admitting: Psychiatry

## 2020-06-04 DIAGNOSIS — F419 Anxiety disorder, unspecified: Secondary | ICD-10-CM | POA: Diagnosis not present

## 2020-06-04 DIAGNOSIS — F3181 Bipolar II disorder: Secondary | ICD-10-CM | POA: Diagnosis not present

## 2020-06-04 NOTE — Telephone Encounter (Signed)
Next apt 10/25

## 2020-06-04 NOTE — Telephone Encounter (Signed)
Noted  

## 2020-06-04 NOTE — Telephone Encounter (Signed)
Pt called and left a message returning jessica's call saying that she is doing better

## 2020-06-09 ENCOUNTER — Encounter: Payer: Self-pay | Admitting: Family Medicine

## 2020-06-12 ENCOUNTER — Encounter: Payer: Self-pay | Admitting: Psychiatry

## 2020-06-16 ENCOUNTER — Encounter: Payer: Self-pay | Admitting: Psychiatry

## 2020-06-16 ENCOUNTER — Other Ambulatory Visit: Payer: Self-pay

## 2020-06-16 ENCOUNTER — Ambulatory Visit (INDEPENDENT_AMBULATORY_CARE_PROVIDER_SITE_OTHER): Payer: Medicare Other | Admitting: Psychiatry

## 2020-06-16 DIAGNOSIS — F5101 Primary insomnia: Secondary | ICD-10-CM

## 2020-06-16 DIAGNOSIS — F319 Bipolar disorder, unspecified: Secondary | ICD-10-CM | POA: Diagnosis not present

## 2020-06-16 DIAGNOSIS — G2401 Drug induced subacute dyskinesia: Secondary | ICD-10-CM

## 2020-06-16 DIAGNOSIS — F4310 Post-traumatic stress disorder, unspecified: Secondary | ICD-10-CM | POA: Diagnosis not present

## 2020-06-16 MED ORDER — CLONAZEPAM 1 MG PO TABS: 1.0000 mg | ORAL_TABLET | Freq: Every day | ORAL | 2 refills | Status: DC

## 2020-06-16 MED ORDER — ALPRAZOLAM 1 MG PO TABS: ORAL_TABLET | ORAL | 0 refills | Status: DC

## 2020-06-16 MED ORDER — OLANZAPINE 5 MG PO TABS: 5.0000 mg | ORAL_TABLET | Freq: Every day | ORAL | 1 refills | Status: DC

## 2020-06-16 NOTE — Progress Notes (Signed)
Theresa [name] [id number] May 31, 1956 64 y.o.  Subjective:   Patient ID:  Theresa [name] is a 63 y.o. (DOB 1955/12/11) female.  Chief Complaint:  Chief Complaint  Patient presents with  . Follow-up    Recent mania, insomnia, and anxiety    HPI Theresa [name] presents to the office today for follow-up of recent mania, insomnia, and anxiety. She reports that she reports that she started feeling that mania seemed to subside and she now is experiencing some sleepiness. She reports that she continued to take higher amounts of medications for a few days after mania subsided and decreased to previous doses about a week ago.   Denies depressed or elevated mood. Denies irritability. Sleeping from 11 pm-7 am. Reports that she has been sleepy throughout the day and has napped some afternoons. She has dozed off a couple times in the morning. Anxiety has been well controlled. She reports that a few nights ago she woke up in the middle of the night and took some additional Xanax and was able to return to sleep. She reports that PTSD s/s have been "ok" and continues to see Dr. Rexene Edison q 2 weeks for EMDR. Denies any risky or impulsive behavior. Denies excessive spending. Energy has been lower and is making herself walk with the dogs. Motivation has been low for other things. 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 Has been vacuuming and dusting. Racing thoughts have resolved. She reports that concentration has improved some.  Denies SI.   She reports that TD remains improved with Austedo.   Enjoying her new puppy.   Past Psychiatric Medication Trials: Olanzapine Seroquel Saphris Depakote-self-injurious behavior Lamictal Keppra Carbamazepine Xanax Klonopin Ambien Trazodone Benztropine Ingrezza Propranolol- Ineffective   AIMS     Office Visit from 06/16/2020 in [place] Visit from 04/03/2020 in [place] Visit from  03/06/2020 in [place] Visit from 06/26/2019 in [place] Visit from 04/26/2019 in [place] Total Score 8 14 19 9 18     Mini-Mental     Office Visit from 02/28/2017 in [facility]  Total Score (max 30 points ) 29    PHQ2-9     Office Visit from 02/28/2017 in [facility] at [place] Visit from 09/19/2015 in Primary Care at [place] from 06/05/2015 in [place] at [place] from 03/10/2015 in Primary Care at [facility] Total Score 0 0 0 0       Review of Systems:  Review of Systems  Musculoskeletal: Negative for gait problem.  Neurological: Positive for tremors.  Psychiatric/Behavioral:       Please refer to HPI    Medications: I have reviewed the patient's current medications.  Current Outpatient Medications  Medication Sig Dispense Refill  . ALPRAZolam (XANAX XR) 2 MG 24 hr tablet TAKE 1 TABLET BY MOUTH IN  THE MORNING AND AT BEDTIME 180 tablet 0  . carbamazepine (CARBATROL) 300 MG 12 hr capsule Take 2 capsules (600 mg total) by mouth 2 (two) times daily. (Patient taking differently: Take 600 mg by mouth 2 (two) times daily. Taking 1 capsule in the morning and 2 capsules at night) 360 capsule 0  . cetirizine (ZYRTEC) 10 MG tablet Take 10 mg by mouth daily as needed.     . clonazePAM (KLONOPIN) 1 MG tablet Take 1 tablet (1  mg total) by mouth at bedtime. 30 tablet 2  . Deutetrabenazine (AUSTEDO) 12 MG TABS Take 1 tablet po BID with a 9 mg tablet to equal 21 mg po BID 60 tablet 5  . Deutetrabenazine (AUSTEDO) 9 MG TABS Take 1 tab po BID with a 12 mg tab to equal 21 mg BID 60 tablet 5  . fluticasone (FLONASE) 50 MCG/ACT nasal spray Place into both nostrils daily as needed.     . levETIRAcetam (KEPPRA) 500 MG tablet Take 1 tablet (500 mg total) by mouth 2 (two) times daily. 180 tablet 3  . lovastatin (MEVACOR) 20 MG  tablet TAKE 1 TABLET BY MOUTH  DAILY 90 tablet 3  . Melatonin 10 MG TABS Take 1 tablet by mouth as needed. Reported on 01/30/2016     . OLANZapine (ZYPREXA) 5 MG tablet Take 1 tablet (5 mg total) by mouth at bedtime. 90 tablet 1  . polyethylene glycol (MIRALAX / GLYCOLAX) 17 g packet Take 17 g by mouth daily.    . traZODone (DESYREL) 100 MG tablet TAKE 1 TO 2 TABLETS BY  MOUTH AT BEDTIME AS NEEDED  FOR SLEEP 180 tablet 3  . zolpidem (AMBIEN) 10 MG tablet TAKE 1 TABLET BY MOUTH AT  BEDTIME AS NEEDED 30 tablet 5  . ALPRAZolam (XANAX) 1 MG tablet TAKE 1 TABLET BY MOUTH DAILY AS NEEDED FOR SEVERE ANXIETY 15 tablet 0   No current facility-administered medications for this visit.    Medication Side Effects: Other: Sleepiness, TD  Allergies:  Allergies  Allergen Reactions  . Lamictal [Lamotrigine]     Acute renal failure  . Ropinirole Nausea And Vomiting  . Tramadol   . Codeine Swelling and Rash    Can take hydrocodone  . Penicillins     Abdominal pain    Past Medical History:  Diagnosis Date  . Adenomatous colon polyp   . Allergy   . Anxiety   . Bipolar 1 disorder ([place])   . Blood transfusion without reported diagnosis   . Depression   . Elevated LFTs   . Hepatic steatosis 05/07/13  . Hyperlipidemia   . Seizures ([place])   . Tardive dyskinesia     Family History  Problem Relation Age of Onset  . Colon polyps Mother   . Cancer Mother   . Heart disease Mother   . Alcoholism Mother   . Cancer Father   . Alcoholism Father   . Bipolar disorder Father   . Drug abuse Brother   . Bipolar disorder Other   . Colon cancer Neg Hx   . Stomach cancer Neg Hx   . Breast cancer Neg Hx     Social History   Socioeconomic History  . Marital status: Married    Spouse name: Not on file  . Number of children: Not on file  . Years of education: Not on file  . Highest education level: Not on file  Occupational History  . Not on file  Tobacco Use  . Smoking status: Never Smoker  .  Smokeless tobacco: Never Used  Vaping Use  . Vaping Use: Never used  Substance and Sexual Activity  . Alcohol use: Yes    Comment: occasionaly 1 a month  . Drug use: No  . Sexual activity: Not Currently  Other Topics Concern  . 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 Difficulty of Paying Living Expenses: Not very hard  Food Insecurity: No Food Insecurity  . Worried About Charity fundraiser in the Last Year: Never true  . Ran Out of Food in the Last Year: Never true  Transportation Needs: No Transportation Needs  . Lack of Transportation (Medical): No  . Lack of Transportation (Non-Medical): No  Physical Activity:   . Days of Exercise per Week: Not on file  . Minutes of Exercise per Session: Not on file  Stress:   . Feeling of Stress : Not on file  Social Connections:   . Frequency of Communication with Friends and Family: Not on file  . Frequency of Social Gatherings with Friends and Family: Not on file  . Attends Religious Services: Not on file  . Active Member of Clubs or Organizations: Not on file  . Attends Archivist Meetings: Not on file  . Marital Status: Not on file  Intimate Partner Violence: Not At Risk  . Fear of Current or Ex-Partner: No  . Emotionally Abused: No  . Physically Abused: No  . Sexually Abused: No    Past Medical History, Surgical history, Social history, and Family history were reviewed and updated as appropriate.   Please see review of systems for further details on the patient's review from today.   Objective:   Physical Exam:  There were no vitals taken for this visit.  Physical Exam Neurological:     Mental Status: She is alert and oriented to person, place, and time.     Cranial Nerves: No dysarthria.  Psychiatric:         Attention and Perception: Attention and perception normal.        Mood and Affect: Mood normal.        Speech: Speech normal.        Behavior: Behavior is cooperative.        Thought Content: Thought content normal. Thought content is not paranoid or delusional. Thought content does not include homicidal or suicidal ideation. Thought content does not include homicidal or suicidal plan.        Cognition and Memory: Cognition and memory normal.        Judgment: Judgment normal.     Comments: Insight intact     Lab Review:     Component Value Date/Time   NA 130 (L) 05/08/2020 1015   K 4.6 05/08/2020 1015   CL 93 (L) 05/08/2020 1015   CO2 29 05/08/2020 1015   GLUCOSE 93 05/08/2020 1015   BUN 11 05/08/2020 1015   CREATININE 0.79 05/08/2020 1015   CALCIUM 9.7 05/08/2020 1015   PROT 6.7 05/08/2020 1015   ALBUMIN 4.5 02/01/2019 1427   AST 23 05/08/2020 1015   ALT 31 (H) 05/08/2020 1015   ALKPHOS 128 (H) 02/01/2019 1427   BILITOT 0.5 05/08/2020 1015       Component Value Date/Time   WBC 4.8 05/08/2020 1015   RBC 4.94 05/08/2020 1015   HGB 14.7 05/08/2020 1015   HCT 42.8 05/08/2020 1015   PLT 188 05/08/2020 1015   MCV 86.6 05/08/2020 1015   MCV 87.7 02/07/2013 1110   MCH 29.8 05/08/2020 1015   MCHC 34.3 05/08/2020 1015   RDW 12.8 05/08/2020 1015    No results found for: POCLITH, LITHIUM   Lab Results  Component Value Date   CBMZ  5.2 02/01/2019     .res Assessment: Plan:   Continue current medications without any changes.   Discussed continuing maintenance doses of olanzapine 5 mg at bedtime and carbamazepine extended release 300 mg in the morning and 600 mg at bedtime.  Discussed that sleepiness will likely improve and may be result of recent mania and diminished sleep. Continue Austedo for tardive dyskinesia. Continue Ambien and trazodone for insomnia. Continue Xanax XR 2 mg twice daily.  Patient request additional prescription of small number of Xanax immediate release  to be used as needed for acute anxiety, such as prior to upcoming cervical biopsy.  Will send prescription for Xanax 1 mg 1 tab daily as needed #15. Recommend continuing psychotherapy. Patient to follow-up in 6 weeks or sooner if clinically indicated. Patient advised to contact office with any questions, adverse effects, or acute worsening in signs and symptoms.  Theresa [name] was seen today for follow-up.  Diagnoses and all orders for this visit:  Bipolar 1 disorder ([place]) -     OLANZapine (ZYPREXA) 5 MG tablet; Take 1 tablet (5 mg total) by mouth at bedtime.  Primary insomnia -     clonazePAM (KLONOPIN) 1 MG tablet; Take 1 tablet (1 mg total) by mouth at bedtime.  Posttraumatic stress disorder -     ALPRAZolam (XANAX) 1 MG tablet; TAKE 1 TABLET BY MOUTH DAILY AS NEEDED FOR SEVERE ANXIETY  Tardive dyskinesia     Please see After Visit Summary for patient specific instructions.  Future Appointments  Date Time Provider [place]  07/02/2020  3:00 PM Doree Fudge, PhD [facility] None  07/10/2020 11:15 AM [facility] NURSE [facility]  07/28/2020  1:15 PM [name], [place] CP-CP None  07/30/2020  3:00 PM Doree Fudge, PhD [facility] None  08/27/2020  3:00 PM Doree Fudge, PhD [facility] None  09/24/2020  3:00 PM Doree Fudge, PhD [facility] None  09/29/2020  3:00 PM Cameron Sprang, MD [facility] None  10/22/2020  3:00 PM Doree Fudge, PhD [facility] None  11/19/2020  3:00 PM Doree Fudge, PhD [facility] None  12/17/2020  3:00 PM Doree Fudge, PhD [facility] None    No orders of the defined types were placed in this encounter.   -------------------------------

## 2020-06-16 NOTE — Progress Notes (Signed)
   06/16/20 1239  Facial and Oral Movements  Muscles of Facial Expression 0  Lips and Perioral Area 1  Jaw 0  Tongue 1  Extremity Movements  Upper (arms, wrists, hands, fingers) 0  Lower (legs, knees, ankles, toes) 1  Trunk Movements  Neck, shoulders, hips 0  Overall Severity  Severity of abnormal movements (highest score from questions above) 1  Incapacitation due to abnormal movements 2  Patient's awareness of abnormal movements (rate only patient's report) 2  Dental Status  Current problems with teeth and/or dentures? Yes (Reports chronic grinding and clinching)  AIMS Total Score  AIMS Total Score 8

## 2020-06-18 ENCOUNTER — Ambulatory Visit (INDEPENDENT_AMBULATORY_CARE_PROVIDER_SITE_OTHER): Payer: Medicare Other | Admitting: Psychology

## 2020-06-18 DIAGNOSIS — F3181 Bipolar II disorder: Secondary | ICD-10-CM

## 2020-06-18 DIAGNOSIS — F419 Anxiety disorder, unspecified: Secondary | ICD-10-CM

## 2020-06-25 ENCOUNTER — Ambulatory Visit (INDEPENDENT_AMBULATORY_CARE_PROVIDER_SITE_OTHER): Payer: Medicare Other | Admitting: Psychology

## 2020-06-25 DIAGNOSIS — F449 Dissociative and conversion disorder, unspecified: Secondary | ICD-10-CM | POA: Diagnosis not present

## 2020-06-25 DIAGNOSIS — F3181 Bipolar II disorder: Secondary | ICD-10-CM | POA: Diagnosis not present

## 2020-06-27 DIAGNOSIS — Z7689 Persons encountering health services in other specified circumstances: Secondary | ICD-10-CM | POA: Diagnosis not present

## 2020-07-02 ENCOUNTER — Ambulatory Visit (INDEPENDENT_AMBULATORY_CARE_PROVIDER_SITE_OTHER): Payer: Medicare Other | Admitting: Psychology

## 2020-07-02 DIAGNOSIS — F3181 Bipolar II disorder: Secondary | ICD-10-CM

## 2020-07-02 DIAGNOSIS — F449 Dissociative and conversion disorder, unspecified: Secondary | ICD-10-CM | POA: Diagnosis not present

## 2020-07-10 ENCOUNTER — Other Ambulatory Visit: Payer: Self-pay

## 2020-07-10 ENCOUNTER — Ambulatory Visit (INDEPENDENT_AMBULATORY_CARE_PROVIDER_SITE_OTHER): Payer: Medicare Other

## 2020-07-10 DIAGNOSIS — Z23 Encounter for immunization: Secondary | ICD-10-CM

## 2020-07-22 ENCOUNTER — Ambulatory Visit (INDEPENDENT_AMBULATORY_CARE_PROVIDER_SITE_OTHER): Payer: Medicare Other | Admitting: Psychology

## 2020-07-22 DIAGNOSIS — F3181 Bipolar II disorder: Secondary | ICD-10-CM | POA: Diagnosis not present

## 2020-07-22 DIAGNOSIS — F449 Dissociative and conversion disorder, unspecified: Secondary | ICD-10-CM | POA: Diagnosis not present

## 2020-07-28 ENCOUNTER — Other Ambulatory Visit: Payer: Self-pay

## 2020-07-28 ENCOUNTER — Ambulatory Visit (INDEPENDENT_AMBULATORY_CARE_PROVIDER_SITE_OTHER): Payer: Medicare Other | Admitting: Psychiatry

## 2020-07-28 ENCOUNTER — Encounter: Payer: Self-pay | Admitting: Psychiatry

## 2020-07-28 DIAGNOSIS — F4310 Post-traumatic stress disorder, unspecified: Secondary | ICD-10-CM | POA: Diagnosis not present

## 2020-07-28 DIAGNOSIS — G40309 Generalized idiopathic epilepsy and epileptic syndromes, not intractable, without status epilepticus: Secondary | ICD-10-CM | POA: Diagnosis not present

## 2020-07-28 DIAGNOSIS — G2401 Drug induced subacute dyskinesia: Secondary | ICD-10-CM

## 2020-07-28 DIAGNOSIS — F5101 Primary insomnia: Secondary | ICD-10-CM

## 2020-07-28 MED ORDER — AUSTEDO 12 MG PO TABS: ORAL_TABLET | ORAL | 5 refills | Status: DC

## 2020-07-28 MED ORDER — ALPRAZOLAM 1 MG PO TABS: ORAL_TABLET | ORAL | 0 refills | Status: DC

## 2020-07-28 MED ORDER — ALPRAZOLAM ER 2 MG PO TB24: ORAL_TABLET | ORAL | 1 refills | Status: DC

## 2020-07-28 MED ORDER — AUSTEDO 9 MG PO TABS: ORAL_TABLET | ORAL | 5 refills | Status: DC

## 2020-07-28 MED ORDER — CARBAMAZEPINE ER 300 MG PO CP12: ORAL_CAPSULE | ORAL | 1 refills | Status: DC

## 2020-07-28 MED ORDER — TRAZODONE HCL 100 MG PO TABS: 100.0000 mg | ORAL_TABLET | Freq: Every evening | ORAL | 3 refills | Status: DC | PRN

## 2020-07-28 NOTE — Progress Notes (Signed)
Theresa [name] [id number] 04-09-1956 64 y.o.  Subjective:   Patient ID:  Theresa [name] is a 64 y.o. (DOB 25-May-1956) female.  Chief Complaint:  Chief Complaint  Patient presents with  . Follow-up    Bipolar, Anxiety, Insomnia    HPI Theresa [name] presents to the office today for follow-up of h/o mood disturbance, anxiety, and insomnia.  She reports "normal ups and downs" without any extremes in mood. She reports that she is sleeping well most nights and typically has about one night a week of sleeplessness. She reports that she tends to awaken throughout the night consistently. They report that she has improved sleep after sleepless night. They report that if she has 2 consecutive sleepless nights that it tends to indicate progression to mania. Denies nightmares. Husband notices she will occasionally "unload" about an unrelated subject and ask questions unrelated to the task at hand. Denies any risky behavior or other impulsivity. Denies any excessive spending. Energy and motivation have been low. Plans to move stationary bike to her bedroom so she can bike infront of the TV. She reports difficulty with concentration. Appetite has been fine. She reports that she rarely naps and when she naps it is usually for a short duration.   She reports that recent screening with therapist indicated dissociation. She reports some low-grade anxiety. Continues to experience some social anxiety. Was ok with Thanksgiving dinner and felt "a little bit anxious."   She reports chronic passive death wishes/suicidal thoughts without intent or plan. She reports that she used to have self-injurious ideation.   Enjoys going out in the woods with her dogs. Has difficulty painting due to Tardive dyskinesia. Denies any change in TD.  Past Psychiatric Medication Trials: Olanzapine Seroquel Saphris Depakote-self-injurious  behavior Lamictal Keppra Carbamazepine Xanax Klonopin Ambien Trazodone Benztropine Ingrezza Propranolol- Ineffective  AIMS     Office Visit from 06/16/2020 in [place] Visit from 04/03/2020 in [place] Visit from 03/06/2020 in [place] Visit from 06/26/2019 in [place] Visit from 04/26/2019 in [place] Total Score 8 14 19 9 18     Mini-Mental     Office Visit from 02/28/2017 in [facility]  Total Score (max 30 points ) 29    PHQ2-9     Office Visit from 02/28/2017 in [facility] at [place] Visit from 09/19/2015 in [facility] at [place] from 06/05/2015 in [place] [facility] [place] from 03/10/2015 in Primary Care at [facility] Total Score 0 0 0 0       Review of Systems:  Review of Systems  Genitourinary:       Cervical biopsy was normal  Musculoskeletal: Negative for gait problem.  Neurological: Positive for tremors. Negative for seizures.  Psychiatric/Behavioral:       Please refer to HPI    Medications: I have reviewed the patient's current medications.  Current Outpatient Medications  Medication Sig Dispense Refill  . carbamazepine (CARBATROL) 300 MG 12 hr capsule Take 1 capsule (300 mg total) by mouth in the morning AND 2 capsules (600 mg total) at bedtime. Taking 1 capsule in the morning and 2 capsules at night. 270 capsule 1  . [START ON 08/26/2020] ALPRAZolam (XANAX XR) 2 MG 24 hr tablet TAKE 1 TABLET BY MOUTH IN  THE MORNING AND AT BEDTIME 180 tablet 1  . ALPRAZolam Duanne Moron) 1  MG tablet TAKE 1 TABLET BY MOUTH DAILY AS NEEDED FOR SEVERE ANXIETY 15 tablet 0  . cetirizine (ZYRTEC) 10 MG tablet Take 10 mg by mouth daily as needed.     . clonazePAM (KLONOPIN) 1 MG tablet Take 1 tablet (1 mg total) by mouth at bedtime. 30 tablet 2  .  Deutetrabenazine (AUSTEDO) 12 MG TABS Take 1 tablet po BID with a 9 mg tablet to equal 21 mg po BID 60 tablet 5  . Deutetrabenazine (AUSTEDO) 9 MG TABS Take 1 tab po BID with a 12 mg tab to equal 21 mg BID 60 tablet 5  . fluticasone (FLONASE) 50 MCG/ACT nasal spray Place into both nostrils daily as needed.     . levETIRAcetam (KEPPRA) 500 MG tablet Take 1 tablet (500 mg total) by mouth 2 (two) times daily. 180 tablet 3  . lovastatin (MEVACOR) 20 MG tablet TAKE 1 TABLET BY MOUTH  DAILY 90 tablet 3  . Melatonin 10 MG TABS Take 1 tablet by mouth as needed. Reported on 01/30/2016     . OLANZapine (ZYPREXA) 5 MG tablet Take 1 tablet (5 mg total) by mouth at bedtime. 90 tablet 1  . polyethylene glycol (MIRALAX / GLYCOLAX) 17 g packet Take 17 g by mouth daily.    . traZODone (DESYREL) 100 MG tablet Take 1-2 tablets (100-200 mg total) by mouth at bedtime as needed. for sleep 180 tablet 3  . zolpidem (AMBIEN) 10 MG tablet TAKE 1 TABLET BY MOUTH AT  BEDTIME AS NEEDED 30 tablet 5   No current facility-administered medications for this visit.    Medication Side Effects: Other: Drowsiness  Allergies:  Allergies  Allergen Reactions  . Lamictal [Lamotrigine]     Acute renal failure  . Ropinirole Nausea And Vomiting  . Tramadol   . Codeine Swelling and Rash    Can take hydrocodone  . Penicillins     Abdominal pain    Past Medical History:  Diagnosis Date  . Adenomatous colon polyp   . Allergy   . Anxiety   . Bipolar 1 disorder ([place])   . Blood transfusion without reported diagnosis   . Depression   . Elevated LFTs   . Hepatic steatosis 05/07/13  . Hyperlipidemia   . Seizures ([place])   . Tardive dyskinesia     Family History  Problem Relation Age of Onset  . Colon polyps Mother   . Cancer Mother   . Heart disease Mother   . Alcoholism Mother   . Cancer Father   . Alcoholism Father   . Bipolar disorder Father   . Drug abuse Brother   . Bipolar disorder Other   . Colon cancer Neg Hx    . Stomach cancer Neg Hx   . Breast cancer Neg Hx     Social History   Socioeconomic History  . Marital status: Married    Spouse name: Not on file  . Number of children: Not on file  . Years of education: Not on file  . Highest education level: Not on file  Occupational History  . Not on file  Tobacco Use  . Smoking status: Never Smoker  . Smokeless tobacco: Never Used  Vaping Use  . Vaping Use: Never used  Substance and Sexual Activity  . Alcohol use: Yes    Comment: occasionaly 1 a month  . Drug use: No  . Sexual activity: Not Currently  Other Topics Concern  . 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 Difficulty of Paying Living Expenses: Not very hard  Food Insecurity: No Food Insecurity  . Worried About Charity fundraiser in the Last Year: Never true  . Ran Out of Food in the Last Year: Never true  Transportation Needs: No Transportation Needs  . Lack of Transportation (Medical): No  . Lack of Transportation (Non-Medical): No  Physical Activity:   . Days of Exercise per Week: Not on file  . Minutes of Exercise per Session: Not on file  Stress:   . Feeling of Stress : Not on file  Social Connections:   . Frequency of Communication with Friends and Family: Not on file  . Frequency of Social Gatherings with Friends and Family: Not on file  . Attends Religious Services: Not on file  . Active Member of Clubs or Organizations: Not on file  . Attends Archivist Meetings: Not on file  . Marital Status: Not on file  Intimate Partner Violence: Not At Risk  . Fear of Current or Ex-Partner: No  . Emotionally Abused: No  . Physically Abused: No  . Sexually Abused: No    Past Medical History, Surgical history, Social history, and Family history were reviewed and  updated as appropriate.   Please see review of systems for further details on the patient's review from today.   Objective:   Physical Exam:  There were no vitals taken for this visit.  Physical Exam Constitutional:      General: She is not in acute distress. Musculoskeletal:        General: No deformity.  Neurological:     Mental Status: She is alert and oriented to person, place, and time.     Coordination: Coordination normal.  Psychiatric:        Attention and Perception: Attention and perception normal. She does not perceive auditory or visual hallucinations.        Mood and Affect: Mood is anxious. Mood is not depressed. Affect is not labile, blunt, angry or inappropriate.        Speech: Speech normal.        Behavior: Behavior normal.        Thought Content: Thought content normal. Thought content is not paranoid or delusional. Thought content does not include homicidal or suicidal ideation. Thought content does not include homicidal or suicidal plan.        Cognition and Memory: Cognition and memory normal.        Judgment: Judgment normal.     Comments: Insight intact     Lab Review:     Component Value Date/Time   NA 130 (L) 05/08/2020 1015   K 4.6 05/08/2020 1015   CL 93 (L) 05/08/2020 1015   CO2 29 05/08/2020 1015   GLUCOSE 93 05/08/2020 1015   BUN 11 05/08/2020 1015   CREATININE 0.79 05/08/2020 1015   CALCIUM 9.7 05/08/2020 1015   PROT 6.7 05/08/2020 1015   ALBUMIN 4.5 02/01/2019 1427   AST 23 05/08/2020 1015   ALT 31 (H) 05/08/2020 1015   ALKPHOS 128 (H) 02/01/2019 1427   BILITOT 0.5 05/08/2020 1015       Component Value Date/Time   WBC 4.8 05/08/2020 1015   RBC 4.94 05/08/2020 1015  HGB 14.7 05/08/2020 1015   HCT 42.8 05/08/2020 1015   PLT 188 05/08/2020 1015   MCV 86.6 05/08/2020 1015   MCV 87.7 02/07/2013 1110   MCH 29.8 05/08/2020 1015   MCHC 34.3 05/08/2020 1015   RDW 12.8 05/08/2020 1015    No results found for: POCLITH, LITHIUM    Lab Results  Component Value Date   CBMZ 5.2 02/01/2019     .res Assessment: Plan:   Will continue current plan of care since target signs and symptoms are well controlled without any tolerability issues. Discussed chronic passive death wishes and what her baseline typically is. Pt reports that she will continue to contact office when she experiences any worsening SI.  Continue Xanax XR 2 mg twice daily for anxiety. Continue Xanax 1 mg as needed for severe anxiety. Continue carbamazepine 300 mg in the morning and 600 mg at bedtime. Continue Klonopin 1 mg at bedtime for anxiety and insomnia. Continue Austedo 21 mg twice daily for tardive dyskinesia. Continue olanzapine 5 mg at bedtime for mood stabilization. Continue trazodone as needed for insomnia. Continue Ambien as needed for insomnia. Patient to follow-up in 2 months or sooner if clinically indicated. Patient advised to contact office with any questions, adverse effects, or acute worsening in signs and symptoms.  Theresa [name] was seen today for follow-up.  Diagnoses and all orders for this visit:  Posttraumatic stress disorder -     ALPRAZolam (XANAX XR) 2 MG 24 hr tablet; TAKE 1 TABLET BY MOUTH IN  THE MORNING AND AT BEDTIME -     ALPRAZolam (XANAX) 1 MG tablet; TAKE 1 TABLET BY MOUTH DAILY AS NEEDED FOR SEVERE ANXIETY  Generalized idiopathic epilepsy and epileptic syndromes, not intractable, without status epilepticus (HCC) -     carbamazepine (CARBATROL) 300 MG 12 hr capsule; Take 1 capsule (300 mg total) by mouth in the morning AND 2 capsules (600 mg total) at bedtime. Taking 1 capsule in the morning and 2 capsules at night.  Tardive dyskinesia -     Deutetrabenazine (AUSTEDO) 12 MG TABS; Take 1 tablet po BID with a 9 mg tablet to equal 21 mg po BID -     Deutetrabenazine (AUSTEDO) 9 MG TABS; Take 1 tab po BID with a 12 mg tab to equal 21 mg BID  Primary insomnia -     traZODone (DESYREL) 100 MG tablet; Take 1-2 tablets  (100-200 mg total) by mouth at bedtime as needed. for sleep     Please see After Visit Summary for patient specific instructions.  Future Appointments  Date Time Provider [place]  07/30/2020  3:00 PM Doree Fudge, PhD [facility] None  08/27/2020  3:00 PM Doree Fudge, PhD [facility] None  09/10/2020  3:00 PM Doree Fudge, PhD [facility] None  09/24/2020  3:00 PM Doree Fudge, PhD [facility] None  09/29/2020  3:00 PM Cameron Sprang, MD [facility] None  09/30/2020  1:30 PM [name], [place] [facility] None  10/08/2020  3:00 PM Doree Fudge, PhD [facility] None  10/22/2020  3:00 PM Doree Fudge, PhD [facility] None  11/05/2020  3:00 PM Doree Fudge, PhD [facility] None  11/19/2020  3:00 PM Doree Fudge, PhD [facility] None  12/03/2020  3:00 PM Doree Fudge, PhD [facility] None  12/17/2020  3:00 PM Doree Fudge, PhD [facility] None  12/31/2020  3:00 PM Doree Fudge, PhD [facility] None  01/14/2021  3:00 PM Doree Fudge, PhD [facility] None  No orders of the defined types were placed in this encounter.   -------------------------------

## 2020-07-30 ENCOUNTER — Ambulatory Visit (INDEPENDENT_AMBULATORY_CARE_PROVIDER_SITE_OTHER): Payer: Medicare Other | Admitting: Psychology

## 2020-07-30 DIAGNOSIS — F449 Dissociative and conversion disorder, unspecified: Secondary | ICD-10-CM

## 2020-07-30 DIAGNOSIS — F4312 Post-traumatic stress disorder, chronic: Secondary | ICD-10-CM | POA: Diagnosis not present

## 2020-07-30 DIAGNOSIS — F3181 Bipolar II disorder: Secondary | ICD-10-CM | POA: Diagnosis not present

## 2020-08-25 ENCOUNTER — Telehealth: Payer: Self-pay

## 2020-08-25 NOTE — Telephone Encounter (Signed)
Prior authorization approved for AUSTEDO 9 MG BID effective 08/23/2020/08/22/2021 with [employer] Medicare Part D,

## 2020-08-27 ENCOUNTER — Ambulatory Visit (INDEPENDENT_AMBULATORY_CARE_PROVIDER_SITE_OTHER): Payer: Medicare Other | Admitting: Psychology

## 2020-08-27 DIAGNOSIS — F3181 Bipolar II disorder: Secondary | ICD-10-CM

## 2020-09-04 ENCOUNTER — Other Ambulatory Visit: Payer: Self-pay | Admitting: Psychiatry

## 2020-09-04 DIAGNOSIS — F4310 Post-traumatic stress disorder, unspecified: Secondary | ICD-10-CM

## 2020-09-10 ENCOUNTER — Ambulatory Visit (INDEPENDENT_AMBULATORY_CARE_PROVIDER_SITE_OTHER): Payer: Medicare Other | Admitting: Psychology

## 2020-09-10 DIAGNOSIS — F3181 Bipolar II disorder: Secondary | ICD-10-CM | POA: Diagnosis not present

## 2020-09-24 ENCOUNTER — Ambulatory Visit: Payer: Medicare Other | Admitting: Psychology

## 2020-09-29 ENCOUNTER — Ambulatory Visit: Payer: Medicare Other | Admitting: Neurology

## 2020-09-29 ENCOUNTER — Other Ambulatory Visit: Payer: Self-pay

## 2020-09-29 ENCOUNTER — Encounter: Payer: Self-pay | Admitting: Neurology

## 2020-09-29 DIAGNOSIS — G40309 Generalized idiopathic epilepsy and epileptic syndromes, not intractable, without status epilepticus: Secondary | ICD-10-CM | POA: Diagnosis not present

## 2020-09-29 MED ORDER — LEVETIRACETAM 500 MG PO TABS: 500.0000 mg | ORAL_TABLET | Freq: Two times a day (BID) | ORAL | 3 refills | Status: DC
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
## 2020-09-29 NOTE — Patient Instructions (Signed)
Always a pleasure to see you. Continue all your medications. Follow-up in 1 year, call for any changes.   Seizure Precautions: 1. If medication has been prescribed for you to prevent seizures, take it exactly as directed.  Do not stop taking the medicine without talking to your doctor first, even if you have not had a seizure in a long time.   2. Avoid activities in which a seizure would cause danger to yourself or to others.  Don't operate dangerous machinery, swim alone, or climb in high or dangerous places, such as on ladders, roofs, or girders.  Do not drive unless your doctor says you may.  3. If you have any warning that you may have a seizure, lay down in a safe place where you can't hurt yourself.    4.  No driving for 6 months from last seizure, as per [place] state law.   Please refer to the following link on the Epilepsy Foundation of America's website for more information: http://www.epilepsyfoundation.org/answerplace/Social/driving/drivingu.cfm   5.  Maintain good sleep hygiene. Avoid alcohol.  6.  Contact your doctor if you have any problems that may be related to the medicine you are taking.  7.  Call 911 and bring the patient back to the ED if:        A.  The seizure lasts longer than 5 minutes.       B.  The patient doesn't awaken shortly after the seizure  C.  The patient has new problems such as difficulty seeing, speaking or moving  D.  The patient was injured during the seizure  E.  The patient has a temperature over 102 F (39C)  F.  The patient vomited and now is having trouble breathing        

## 2020-09-30 ENCOUNTER — Encounter: Payer: Self-pay | Admitting: Neurology

## 2020-09-30 ENCOUNTER — Encounter: Payer: Self-pay | Admitting: Psychiatry

## 2020-09-30 ENCOUNTER — Ambulatory Visit (INDEPENDENT_AMBULATORY_CARE_PROVIDER_SITE_OTHER): Payer: Medicare Other | Admitting: Psychiatry

## 2020-09-30 DIAGNOSIS — F5101 Primary insomnia: Secondary | ICD-10-CM | POA: Diagnosis not present

## 2020-09-30 DIAGNOSIS — F4310 Post-traumatic stress disorder, unspecified: Secondary | ICD-10-CM | POA: Diagnosis not present

## 2020-09-30 DIAGNOSIS — F319 Bipolar disorder, unspecified: Secondary | ICD-10-CM

## 2020-09-30 MED ORDER — CLONAZEPAM 1 MG PO TABS: 1.0000 mg | ORAL_TABLET | Freq: Every day | ORAL | 2 refills | Status: DC

## 2020-09-30 NOTE — Progress Notes (Signed)
   09/30/20 1351  Facial and Oral Movements  Muscles of Facial Expression 0  Lips and Perioral Area 0  Jaw 0  Tongue 0  Extremity Movements  Upper (arms, wrists, hands, fingers) 0  Lower (legs, knees, ankles, toes) 1  Trunk Movements  Neck, shoulders, hips 0  Overall Severity  Severity of abnormal movements (highest score from questions above) 1  Incapacitation due to abnormal movements 4  Patient's awareness of abnormal movements (rate only patient's report) 3  Dental Status  Current problems with teeth and/or dentures? No  AIMS Total Score  AIMS Total Score 9

## 2020-09-30 NOTE — Progress Notes (Signed)
Theresa [name] [id number] 14-Mar-1956 65 y.o.  Subjective:   Patient ID:  Theresa [name] is a 65 y.o. (DOB [birth year]) female.  Chief Complaint:  Chief Complaint  Patient presents with  . Follow-up    Mood Disturbance, anxiety, insomnia    HPI Odaly H Schmuhl presents to the office today for follow-up of anxiety, Bipolar D/O, and insomnia. She is accompanied by her husband who leaves the room at her request for about 5-10 minutes.  She reports that her mood has been ok. She reports some occasional manic s/s. She reports she is sleeping well. She reports awakening most nights to use the bathroom between 1-2 am. She reports a couple nights a week her sleep is disrupted. She reports "normal energy" and denies excessive energy. Denies any impulsive or risky behaviors. Appetite has been fine. She reports that concentration has been "ok" and will read and watch TV. She reports occ fleeting suicidal thoughts without intent.  She reports that she had anxiety in response to PTSD trigger. She reports using Xanax prn to help with PTSD trigger that caused some SI. She reports that Xanax XR "helps me in general" and has helped overall with anxiety. She also takes Xanax prn prior to therapy appointments.   She notices that TD seems to be worse after increased movement. 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 Teaching herself to play harmonica.   She reports that her therapist is retiring and she will be starting therapy with a new therapist, Myra Gianotti, LCSW.   Past Psychiatric Medication Trials: Olanzapine Seroquel Saphris Depakote-self-injurious behavior Lamictal Keppra Carbamazepine Xanax Klonopin Ambien Trazodone Benztropine Ingrezza Propranolol- Ineffective  AIMS   Flowsheet Row Office Visit from 09/30/2020 in [place] Office Visit from 06/16/2020 in [place] Visit from 04/03/2020 in [place] Visit from 03/06/2020 in [place] Visit from 06/26/2019 in [place] Total Score 9 8 14 19 9     [place] Office Visit from 02/28/2017 in [place] [facility] [place]  Total Score (max 30 points ) 29    PHQ2-9   [place] Visit from 02/28/2017 in [place] [facility] [place] [facility] Visit from 09/19/2015 in [facility] at [place] from 06/05/2015 in [place] [facility] [place] [facility] from 03/10/2015 in [facility] at [facility] Total Score 0 0 0 0       Review of Systems:  Review of Systems  Musculoskeletal: Negative for gait problem.  Neurological: Negative for tremors.  Psychiatric/Behavioral:       Please refer to HPI    Medications: I have reviewed the patient's current medications.  Current Outpatient Medications  Medication Sig Dispense Refill  . ALPRAZolam (XANAX XR) 2 MG 24 hr tablet TAKE 1 TABLET BY MOUTH IN  THE MORNING AND AT BEDTIME 180 tablet 1  . ALPRAZolam (XANAX) 1 MG tablet Take 1 mg by mouth daily as needed for anxiety (Severe anxiety).    . carbamazepine (CARBATROL) 300 MG 12 hr capsule Take 1 capsule (300 mg total) by mouth in the morning AND 2 capsules (600 mg total) at bedtime. Taking 1 capsule in the morning and 2 capsules at night. 270 capsule 1  . cetirizine (ZYRTEC) 10 MG tablet Take 10 mg by mouth daily as needed.     . Deutetrabenazine (AUSTEDO) 12 MG  TABS Take 1 tablet po BID with a 9 mg tablet to equal 21 mg po BID 60 tablet 5  . Deutetrabenazine (AUSTEDO) 9 MG TABS Take 1 tab po BID with a 12 mg tab to equal 21 mg BID 60 tablet 5  . fluticasone (FLONASE) 50 MCG/ACT nasal spray Place into both nostrils daily as needed.     . levETIRAcetam (KEPPRA) 500 MG tablet Take 1 tablet (500 mg total) by mouth 2 (two) times daily. 180 tablet 3  . lovastatin (MEVACOR) 20 MG tablet TAKE 1 TABLET BY MOUTH   DAILY 90 tablet 3  . Melatonin 10 MG TABS Take 1 tablet by mouth at bedtime. Reported on 01/30/2016    . Multiple Vitamins-Minerals (CENTRUM SILVER PO) Take by mouth.    . polyethylene glycol (MIRALAX / GLYCOLAX) 17 g packet Take 17 g by mouth daily as needed.    . traZODone (DESYREL) 100 MG tablet Take 1-2 tablets (100-200 mg total) by mouth at bedtime as needed. for sleep 180 tablet 3  . zolpidem (AMBIEN) 10 MG tablet TAKE 1 TABLET BY MOUTH AT  BEDTIME AS NEEDED 30 tablet 5  . clonazePAM (KLONOPIN) 1 MG tablet Take 1 tablet (1 mg total) by mouth at bedtime. 30 tablet 2  . OLANZapine (ZYPREXA) 5 MG tablet Take 1 tablet (5 mg total) by mouth at bedtime. 90 tablet 1   No current facility-administered medications for this visit.    Medication Side Effects: Other: Constipation, wt gain  Allergies:  Allergies  Allergen Reactions  . Lamictal [Lamotrigine]     Acute renal failure  . Ropinirole Nausea And Vomiting  . Tramadol   . Codeine Swelling and Rash    Can take hydrocodone  . Penicillins     Abdominal pain    Past Medical History:  Diagnosis Date  . Adenomatous colon polyp   . Allergy   . Anxiety   . Bipolar 1 disorder ([place])   . Blood transfusion without reported diagnosis   . Depression   . Elevated LFTs   . Hepatic steatosis 05/07/13  . Hyperlipidemia   . Seizures ([place])   . Tardive dyskinesia     Family History  Problem Relation Age of Onset  . Colon polyps Mother   . Cancer Mother   . Heart disease Mother   . Alcoholism Mother   . Cancer Father   . Alcoholism Father   . Bipolar disorder Father   . Drug abuse Brother   . Bipolar disorder Other   . Colon cancer Neg Hx   . Stomach cancer Neg Hx   . Breast cancer Neg Hx     Social History   Socioeconomic History  . Marital status: Married    Spouse name: Not on file  . Number of children: Not on file  . Years of education: Not on file  . Highest education level: Not on file  Occupational History  . Not on  file  Tobacco Use  . Smoking status: Never Smoker  . Smokeless tobacco: Never Used  Vaping Use  . Vaping Use: Never used  Substance and Sexual Activity  . Alcohol use: Yes    Comment: occasionaly 1 a month  . Drug use: No  . Sexual activity: Not Currently  Other Topics Concern  . 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 Difficulty of Paying Living Expenses: Not very hard  Food Insecurity: No Food Insecurity  . Worried About Charity fundraiser in the Last Year: Never true  . Ran Out of Food in the Last Year: Never true  Transportation Needs: No Transportation Needs  . Lack of Transportation (Medical): No  . Lack of Transportation (Non-Medical): No  Physical Activity: Not on file  Stress: Not on file  Social Connections: Not on file  Intimate Partner Violence: Not At Risk  . Fear of Current or Ex-Partner: No  . Emotionally Abused: No  . Physically Abused: No  . Sexually Abused: No    Past Medical History, Surgical history, Social history, and Family history were reviewed and updated as appropriate.   Please see review of systems for further details on the patient's review from today.   Objective:   Physical Exam:  There were no vitals taken for this visit.  Physical Exam Constitutional:      General: She is not in acute distress. Musculoskeletal:        General: No deformity.  Neurological:     Mental Status: She is alert and oriented to person, place, and time.     Coordination: Coordination normal.  Psychiatric:        Attention and Perception: Attention and perception normal. She does not perceive auditory or visual hallucinations.        Mood and Affect: Mood normal. Mood is not anxious or depressed. Affect is not labile, blunt, angry or inappropriate.         Speech: Speech normal.        Behavior: Behavior normal.        Thought Content: Thought content normal. Thought content is not paranoid or delusional. Thought content does not include homicidal or suicidal ideation. Thought content does not include homicidal or suicidal plan.        Cognition and Memory: Cognition and memory normal.        Judgment: Judgment normal.     Comments: Insight intact     Lab Review:     Component Value Date/Time   NA 130 (L) 05/08/2020 1015   K 4.6 05/08/2020 1015   CL 93 (L) 05/08/2020 1015   CO2 29 05/08/2020 1015   GLUCOSE 93 05/08/2020 1015   BUN 11 05/08/2020 1015   CREATININE 0.79 05/08/2020 1015   CALCIUM 9.7 05/08/2020 1015   PROT 6.7 05/08/2020 1015   ALBUMIN 4.5 02/01/2019 1427   AST 23 05/08/2020 1015   ALT 31 (H) 05/08/2020 1015   ALKPHOS 128 (H) 02/01/2019 1427   BILITOT 0.5 05/08/2020 1015       Component Value Date/Time   WBC 4.8 05/08/2020 1015   RBC 4.94 05/08/2020 1015   HGB 14.7 05/08/2020 1015   HCT 42.8 05/08/2020 1015   PLT 188 05/08/2020 1015   MCV 86.6 05/08/2020 1015   MCV 87.7 02/07/2013 1110   MCH 29.8 05/08/2020 1015   MCHC 34.3 05/08/2020 1015   RDW 12.8 05/08/2020 1015    No results found for: POCLITH, LITHIUM   Lab Results  Component Value Date   CBMZ 5.2 02/01/2019     .res Assessment: Plan:   Will continue current plan of care since target signs and symptoms are well controlled without any tolerability issues. Recommend continuing psychotherapy.  Pt to follow-up with this provider in 6-8  weeks or sooner if clinically indicated.  Patient advised to contact office with any questions, adverse effects, or acute worsening in signs and symptoms.  Jaeleah was seen today for follow-up.  Diagnoses and all orders for this visit:  Bipolar affective disorder, remission status unspecified ([place])  Primary insomnia -     clonazePAM (KLONOPIN) 1 MG tablet; Take 1 tablet (1 mg total) by mouth at  bedtime.  Posttraumatic stress disorder     Please see After Visit Summary for patient specific instructions.  Future Appointments  Date Time Provider [place]  10/13/2020 10:00 AM Natividad Brood, [place] [facility] None  11/03/2020 11:00 AM Natividad Brood, LCSW [facility] None  11/18/2020 11:00 AM [name], PMHNP [facility] None  11/24/2020 11:00 AM Natividad Brood, LCSW [facility] None  09/29/2021  3:30 PM Cameron Sprang, MD [facility] None    No orders of the defined types were placed in this encounter.   -------------------------------

## 2020-10-08 ENCOUNTER — Ambulatory Visit: Payer: Medicare Other | Admitting: Psychology

## 2020-10-13 ENCOUNTER — Ambulatory Visit (INDEPENDENT_AMBULATORY_CARE_PROVIDER_SITE_OTHER): Payer: Medicare Other | Admitting: Psychology

## 2020-10-13 DIAGNOSIS — F3181 Bipolar II disorder: Secondary | ICD-10-CM

## 2020-10-19 ENCOUNTER — Other Ambulatory Visit: Payer: Self-pay | Admitting: Psychiatry

## 2020-10-19 DIAGNOSIS — F5101 Primary insomnia: Secondary | ICD-10-CM

## 2020-10-22 ENCOUNTER — Ambulatory Visit: Payer: Medicare Other | Admitting: Psychology

## 2020-11-03 ENCOUNTER — Ambulatory Visit (INDEPENDENT_AMBULATORY_CARE_PROVIDER_SITE_OTHER): Payer: Medicare Other | Admitting: Psychology

## 2020-11-03 DIAGNOSIS — F3181 Bipolar II disorder: Secondary | ICD-10-CM

## 2020-11-05 ENCOUNTER — Other Ambulatory Visit: Payer: Self-pay | Admitting: Psychiatry

## 2020-11-05 ENCOUNTER — Ambulatory Visit: Payer: Medicare Other | Admitting: Psychology

## 2020-11-05 DIAGNOSIS — G40309 Generalized idiopathic epilepsy and epileptic syndromes, not intractable, without status epilepticus: Secondary | ICD-10-CM

## 2020-11-06 ENCOUNTER — Encounter: Payer: Self-pay | Admitting: Family Medicine

## 2020-11-07 MED ORDER — LOVASTATIN 20 MG PO TABS: 20.0000 mg | ORAL_TABLET | Freq: Every day | ORAL | 3 refills | Status: DC

## 2020-11-08 ENCOUNTER — Other Ambulatory Visit: Payer: Self-pay | Admitting: Psychiatry

## 2020-11-10 NOTE — Telephone Encounter (Signed)
Controlled substance 

## 2020-11-11 ENCOUNTER — Other Ambulatory Visit: Payer: Self-pay | Admitting: Psychiatry

## 2020-11-11 DIAGNOSIS — F319 Bipolar disorder, unspecified: Secondary | ICD-10-CM

## 2020-11-18 ENCOUNTER — Other Ambulatory Visit: Payer: Self-pay

## 2020-11-18 ENCOUNTER — Ambulatory Visit (INDEPENDENT_AMBULATORY_CARE_PROVIDER_SITE_OTHER): Payer: Medicare Other | Admitting: Psychiatry

## 2020-11-18 ENCOUNTER — Other Ambulatory Visit: Payer: Self-pay | Admitting: Psychiatry

## 2020-11-18 ENCOUNTER — Encounter: Payer: Self-pay | Admitting: Psychiatry

## 2020-11-18 DIAGNOSIS — F4310 Post-traumatic stress disorder, unspecified: Secondary | ICD-10-CM

## 2020-11-18 DIAGNOSIS — G2401 Drug induced subacute dyskinesia: Secondary | ICD-10-CM | POA: Diagnosis not present

## 2020-11-18 DIAGNOSIS — F319 Bipolar disorder, unspecified: Secondary | ICD-10-CM

## 2020-11-18 DIAGNOSIS — F5101 Primary insomnia: Secondary | ICD-10-CM | POA: Diagnosis not present

## 2020-11-18 MED ORDER — OLANZAPINE 5 MG PO TABS
5.0000 mg | ORAL_TABLET | Freq: Every day | ORAL | 3 refills | Status: DC
Start: 2020-11-18 — End: 2021-04-24

## 2020-11-18 NOTE — Progress Notes (Signed)
Theresa [name] [id number] 26-Sep-1955 65 y.o.  Subjective:   Patient ID:  Theresa [name] is a 65 y.o. (DOB 1956-03-09) female.  Chief Complaint:  Chief Complaint  Patient presents with  . Follow-up    Mood disturbance and anxiety  . Sleeping Problem    HPI Theresa [name] presents to the office today for follow-up of anxiety, Bipolar D/O, and insomnia. She reports that her sleep has been "erratic." She reports sleeping about 4 hours a night, sometimes more. Denies any sleepless nights. She reports that "more days a week I am not sleeping well." She reports occasional nights of adequate sleep.  She reports a possible nocturnal seizure on 11/09/20 and felt very tired the following day with pain in her tongue and headache. Denies nightmares. She reports that she is infrequently napping.   Mood has been "ok. I think I have been more on the manic side."  She notices "faster thinking" and difficulty with concentration. She reports that she is conscious of not frequently interrupting husband. Denies any impulsivity or excessive spending. 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 She repots that her appetite has been ok and they are trying to eat healthier. Denies SI.   She reports that "anxiety in general is ok." She reports occasional episodes of anxiety. She reports that she takes Xanax 1 mg on rare occasions when she has extreme agitation. She reports that she has not used Xanax 1 mg prn since last trip. Denies any panic s/s.   She reports that they are taking a trip in their RV next week. They are meeting some friends in [place]. She reports that she has difficulty reading in the car. She reports some excitement in anticipation of trip and started packing.   Has started therapy with new therapist, Myra Gianotti, LCSW.   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 She reports that  one of her dogs stays with her all the time.   Past Psychiatric Medication Trials: Olanzapine Seroquel Saphris Depakote-self-injurious behavior Lamictal Keppra Carbamazepine Xanax Klonopin Ambien Trazodone Benztropine Ingrezza Propranolol- Ineffective  AIMS   Flowsheet Row Office Visit from 09/30/2020 in [place] Office Visit from 06/16/2020 in [place] Visit from 04/03/2020 in [place] Visit from 03/06/2020 in [place] Visit from 06/26/2019 in [place] Total Score 9 8 14 19 9     [place] Office Visit from 02/28/2017 in [place] at [facility]  Total Score (max 30 points ) 29    PHQ2-9   [place] Visit from 02/28/2017 in [place] [facility] [place] Visit from 09/19/2015 in [facility] at [place] from 06/05/2015 in [place] [facility] [place] from 03/10/2015 in Primary [facility] at [facility] Total Score 0 0 0 0       Review of Systems:  Review of Systems  Respiratory: Positive for cough.   Musculoskeletal: Negative for gait problem.  Allergic/Immunologic: Positive for environmental allergies.  Psychiatric/Behavioral:       Please refer to HPI    Medications: I have reviewed the patient's current medications.  Current Outpatient Medications  Medication Sig Dispense Refill  . ALPRAZolam (XANAX XR) 2 MG 24 hr tablet TAKE 1 TABLET BY MOUTH IN  THE MORNING  AND AT BEDTIME 180 tablet 1  . ALPRAZolam (XANAX) 1 MG tablet TAKE 1 TABLET BY MOUTH  DAILY AS NEEDED FOR SEVERE  ANXIETY 15 tablet 1  . carbamazepine (CARBATROL) 300 MG 12 hr capsule TAKE 1 CAPSULE BY MOUTH IN  THE MORNING AND 2 CAPSULES  BY MOUTH AT BEDTIME 270 capsule 3  . cetirizine (ZYRTEC) 10 MG tablet Take 10 mg by mouth daily as needed.     . clonazePAM (KLONOPIN) 1 MG tablet Take 1  tablet (1 mg total) by mouth at bedtime. 30 tablet 2  . Deutetrabenazine (AUSTEDO) 12 MG TABS Take 1 tablet po BID with a 9 mg tablet to equal 21 mg po BID 60 tablet 5  . Deutetrabenazine (AUSTEDO) 9 MG TABS Take 1 tab po BID with a 12 mg tab to equal 21 mg BID 60 tablet 5  . fluticasone (FLONASE) 50 MCG/ACT nasal spray Place into both nostrils daily as needed.     . levETIRAcetam (KEPPRA) 500 MG tablet Take 1 tablet (500 mg total) by mouth 2 (two) times daily. 180 tablet 3  . lovastatin (MEVACOR) 20 MG tablet Take 1 tablet (20 mg total) by mouth daily. 90 tablet 3  . Melatonin 10 MG TABS Take 1 tablet by mouth at bedtime. Reported on 01/30/2016    . Multiple Vitamins-Minerals (CENTRUM SILVER PO) Take by mouth.    . polyethylene glycol (MIRALAX / GLYCOLAX) 17 g packet Take 17 g by mouth daily as needed.    . traZODone (DESYREL) 100 MG tablet Take 1-2 tablets (100-200 mg total) by mouth at bedtime as needed. for sleep 180 tablet 3  . zolpidem (AMBIEN) 10 MG tablet TAKE 1 TABLET BY MOUTH AT  BEDTIME AS NEEDED 30 tablet 5  . OLANZapine (ZYPREXA) 5 MG tablet Take 1 tablet (5 mg total) by mouth at bedtime. Take 1-2 tabs po QHS 90 tablet 3   No current facility-administered medications for this visit.    Medication Side Effects: None  Allergies:  Allergies  Allergen Reactions  . Lamictal [Lamotrigine]     Acute renal failure  . Ropinirole Nausea And Vomiting  . Tramadol   . Codeine Swelling and Rash    Can take hydrocodone  . Penicillins     Abdominal pain    Past Medical History:  Diagnosis Date  . Adenomatous colon polyp   . Allergy   . Anxiety   . Bipolar 1 disorder ([place])   . Blood transfusion without reported diagnosis   . Depression   . Elevated LFTs   . Hepatic steatosis 05/07/13  . Hyperlipidemia   . Seizures ([place])   . Tardive dyskinesia     Family History  Problem Relation Age of Onset  . Colon polyps Mother   . Cancer Mother   . Heart disease Mother   . Alcoholism  Mother   . Cancer Father   . Alcoholism Father   . Bipolar disorder Father   . Drug abuse Brother   . Bipolar disorder Other   . Colon cancer Neg Hx   . Stomach cancer Neg Hx   . Breast cancer Neg Hx     Social History   Socioeconomic History  . Marital status: Married    Spouse name: Not on file  . Number of children: Not on file  . Years of education: Not on file  . Highest education level: Not on file  Occupational History  . Not on file  Tobacco Use  . Smoking  status: Never Smoker  . Smokeless tobacco: Never Used  Vaping Use  . Vaping Use: Never used  Substance and Sexual Activity  . Alcohol use: Yes    Comment: occasionaly 1 a month  . Drug use: No  . Sexual activity: Not Currently  Other Topics Concern  . 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 Difficulty of Paying Living Expenses: Not very hard  Food Insecurity: No Food Insecurity  . Worried About Charity fundraiser in the Last Year: Never true  . Ran Out of Food in the Last Year: Never true  Transportation Needs: No Transportation Needs  . Lack of Transportation (Medical): No  . Lack of Transportation (Non-Medical): No  Physical Activity: Not on file  Stress: Not on file  Social Connections: Not on file  Intimate Partner Violence: Not At Risk  . Fear of Current or Ex-Partner: No  . Emotionally Abused: No  . Physically Abused: No  . Sexually Abused: No    Past Medical History, Surgical history, Social history, and Family history were reviewed and updated as appropriate.   Please see review of systems for further details on the patient's review from today.   Objective:   Physical Exam:  There were no vitals taken for this visit.  Physical Exam Constitutional:      General: She is not in acute  distress. Musculoskeletal:        General: No deformity.  Neurological:     Mental Status: She is alert and oriented to person, place, and time.     Coordination: Coordination normal.  Psychiatric:        Attention and Perception: Attention and perception normal. She does not perceive auditory or visual hallucinations.        Mood and Affect: Mood is anxious. Affect is not labile, blunt, angry or inappropriate.        Speech: Speech normal.        Behavior: Behavior normal.        Thought Content: Thought content normal. Thought content is not paranoid or delusional. Thought content does not include homicidal or suicidal ideation. Thought content does not include homicidal or suicidal plan.        Cognition and Memory: Cognition and memory normal.        Judgment: Judgment normal.     Comments: Insight intact Mood presents as mildly elevated     Lab Review:     Component Value Date/Time   NA 130 (L) 05/08/2020 1015   K 4.6 05/08/2020 1015   CL 93 (L) 05/08/2020 1015   CO2 29 05/08/2020 1015   GLUCOSE 93 05/08/2020 1015   BUN 11 05/08/2020 1015   CREATININE 0.79 05/08/2020 1015   CALCIUM 9.7 05/08/2020 1015   PROT 6.7 05/08/2020 1015   ALBUMIN 4.5 02/01/2019 1427   AST 23 05/08/2020 1015   ALT 31 (H) 05/08/2020 1015   ALKPHOS 128 (H) 02/01/2019 1427   BILITOT 0.5 05/08/2020 1015       Component Value Date/Time   WBC 4.8 05/08/2020 1015   RBC 4.94 05/08/2020 1015   HGB 14.7 05/08/2020 1015   HCT 42.8 05/08/2020 1015   PLT 188 05/08/2020 1015   MCV 86.6 05/08/2020  1015   MCV 87.7 02/07/2013 1110   MCH 29.8 05/08/2020 1015   MCHC 34.3 05/08/2020 1015   RDW 12.8 05/08/2020 1015    No results found for: POCLITH, LITHIUM   Lab Results  Component Value Date   CBMZ 5.2 02/01/2019     .res Assessment: Plan:   Pt seen for 30 minutes and time spent counseling pt regarding possible treatment options. Discussed temporarily increasing Olanzapine to 7.5 mg po QHS to  stabilize sleep disturbance, racing thoughts, and elevated energy, particularly since she is experiencing some increased excitement with anticipation of upcoming trip. Discussed that she can resume Olanzapine 5 mg po QHS after several nights when acute s/s stabilize. Discussed that pt can increase Olanzapine to 10 mg po QHS if she experiences manic s/s during her trip, since this dose has been helpful for stabilizing mania in the past.  Discussed changing administration time of Zyrtec to HS to minimize potential sedation during the day and to possibly improve sleep.  Continue Xanax XR 2 mg po BID for anxiety.  Continue Xanax 1 mg po qd prn severe anxiety.  Continue Klonopin 1 mg po QHS for anxiety and insomnia.  Continue Carbamazepine ER 300 mg po q am and 600 mg po QHS for mood stabilization.  Continue [place] 21 mg po BID for TD.  Continue Trazodone 100 mg 1-2 tabs po QHS prn insomnia.  Continue Ambien 10 mg po QHS prn insomnia.  Pt to follow-up in 6 weeks or sooner if clinically indicated.  Patient advised to contact office with any questions, adverse effects, or acute worsening in signs and symptoms.   Theresa [name] was seen today for follow-up and sleeping problem.  Diagnoses and all orders for this visit:  Bipolar 1 disorder ([place]) -     OLANZapine (ZYPREXA) 5 MG tablet; Take 1 tablet (5 mg total) by mouth at bedtime. Take 1-2 tabs po QHS  Posttraumatic stress disorder  Tardive dyskinesia  Primary insomnia     Please see After Visit Summary for patient specific instructions.  Future Appointments  Date Time Provider [place]  11/24/2020 11:00 AM Natividad Brood, LCSW [facility] None  12/16/2020 10:00 AM Natividad Brood, LCSW [facility] None  01/05/2021 10:00 AM Natividad Brood, LCSW [facility] None  01/06/2021 11:30 AM [name], PMHNP CP-[facility] None  01/26/2021 11:00 AM Natividad Brood, LCSW [facility] None  02/16/2021 11:00 AM Natividad Brood, LCSW [facility] None  03/09/2021 11:00  AM Natividad Brood, LCSW [facility] None  03/30/2021 11:00 AM Natividad Brood, LCSW [facility] None  04/20/2021 11:00 AM Natividad Brood, LCSW [facility] None  05/11/2021 11:00 AM Natividad Brood, LCSW [facility] None  06/01/2021 11:00 AM Natividad Brood, LCSW [facility] None  06/22/2021 11:00 AM Natividad Brood, LCSW [facility] None  07/13/2021 11:00 AM Natividad Brood, LCSW [facility] None  08/03/2021 11:00 AM Natividad Brood, LCSW [facility] None  09/14/2021 11:00 AM Natividad Brood, LCSW [facility] None  09/29/2021  3:30 PM Cameron Sprang, [place] [facility] None    No orders of the defined types were placed in this encounter.   -------------------------------

## 2020-11-19 ENCOUNTER — Ambulatory Visit: Payer: Medicare Other | Admitting: Psychology

## 2020-11-19 NOTE — Telephone Encounter (Signed)
Controlled substance 

## 2020-11-20 NOTE — Telephone Encounter (Signed)
I have been trying to figure out a way to do that too,traci do you know how to approve one and refuse the other?

## 2020-11-20 NOTE — Telephone Encounter (Signed)
Okay to send now Theresa [name], I updated

## 2020-11-24 ENCOUNTER — Ambulatory Visit (INDEPENDENT_AMBULATORY_CARE_PROVIDER_SITE_OTHER): Payer: Medicare Other | Admitting: Psychology

## 2020-11-24 DIAGNOSIS — F3181 Bipolar II disorder: Secondary | ICD-10-CM

## 2020-11-27 ENCOUNTER — Other Ambulatory Visit: Payer: Self-pay | Admitting: Psychiatry

## 2020-11-27 DIAGNOSIS — F4310 Post-traumatic stress disorder, unspecified: Secondary | ICD-10-CM

## 2020-11-27 NOTE — Telephone Encounter (Signed)
Please review

## 2020-12-02 NOTE — Progress Notes (Addendum)
[facility] [place], [place], [place] [address] [phone]  Date:  12/04/2020   Name:  Theresa [name]   DOB:  1955-12-11   MRN:  [id number]  PCP:  Darreld Mclean, MD    Chief Complaint: Gastroesophageal Reflux Roseanna Rainbow months, Abdominal pain, vomiting "acid", taking tums-worsened with tums, belching, regurgitation ) and Cough   History of Present Illness:  Theresa [name] is a 65 y.o. very pleasant female patient who presents with the following:  Patient today with concern of stomach discomfort and GERD History of bipolar disorder and epilepsy, tardive dyskinesia, dyslipidemia Last seen by myself in September for a physical Her bipolar disorder is managed by [name], NP with behavioral health-she was seen just last month She saw Dr. Delice Lesch, her neurologist in February for epilepsy follow-up Most recent seizure was in June 2021  Most recent colonoscopy 2013- 10 year follow-up  About 2 months ago she began to notice some GERD sx and and also felt like she was regurgitating into her esophagus She is taking tums before meals - this does help She tends to have a dull epigastric pain Worse after eating-however not right upper quadrant pain Today she feels ok as she ate lightly yesterday-just bland foods like pasta She did vomit about a week ago- she was bringing up acid No diarrhea-she tends more towards constipation, takes MiraLAX a few times a week She has not tended to have acid reflux in the past She did take generic pepcid but she is not taking it the last 3 weeks  Her sx are the most bothersome at night-she may feel acid coming up in her throat.  She will prop up and this helps her   Otherwise she is feeling well Here today with her husband  Wt Readings from Last 3 Encounters:  12/04/20 180 lb (81.6 kg)  09/29/20 182 lb (82.6 kg)  05/08/20 173 lb (78.5 kg)    Patient Active Problem List    Diagnosis Date Noted  . Dyslipidemia 05/05/2020  . Manic bipolar I disorder in partial remission ([place]) 07/25/2018  . Posttraumatic stress disorder 07/25/2018  . Generalized idiopathic epilepsy and epileptic syndromes, not intractable, without status epilepticus ([place]) 06/24/2017  . Tardive dyskinesia 01/21/2017  . Special screening for malignant neoplasms, colon 06/16/2012  . Personal history of colonic polyps 06/16/2012  . Bipolar disorder ([place]) 04/02/2012  . Idiopathic generalized epilepsy ([place]) 10/15/2011    Past Medical History:  Diagnosis Date  . Adenomatous colon polyp   . Allergy   . Anxiety   . Bipolar 1 disorder ([place])   . Blood transfusion without reported diagnosis   . Depression   . Elevated LFTs   . Hepatic steatosis 05/07/13  . Hyperlipidemia   . Seizures ([place])   . Tardive dyskinesia     Past Surgical History:  Procedure Laterality Date  . APPENDECTOMY    . COLONOSCOPY  06/16/2012   Procedure: COLONOSCOPY;  Surgeon: Lafayette Dragon, MD;  Location: [facility];  Service: Endoscopy;  Laterality: N/A;  . DILATION AND CURETTAGE OF UTERUS    . KNEE ARTHROSCOPY WITH MENISCAL REPAIR Left   . NECK SURGERY      Social History   Tobacco Use  . Smoking status: Never Smoker  . Smokeless tobacco: Never Used  Vaping Use  . Vaping Use: Never used  Substance Use Topics  . Alcohol use: Yes    Comment: occasionaly  1 a month  . Drug use: No    Family History  Problem Relation Age of Onset  . Colon polyps Mother   . Cancer Mother   . Heart disease Mother   . Alcoholism Mother   . Cancer Father   . Alcoholism Father   . Bipolar disorder Father   . Drug abuse Brother   . Bipolar disorder Other   . Colon cancer Neg Hx   . Stomach cancer Neg Hx   . Breast cancer Neg Hx     Allergies  Allergen Reactions  . Lamictal [Lamotrigine]     Acute renal failure  . Ropinirole Nausea And Vomiting  . Tramadol   . Codeine Swelling and Rash    Can take hydrocodone  .  Penicillins     Abdominal pain    Medication list has been reviewed and updated.  Current Outpatient Medications on File Prior to Visit  Medication Sig Dispense Refill  . ALPRAZolam (XANAX XR) 2 MG 24 hr tablet TAKE 1 TABLET BY MOUTH IN  THE MORNING AND AT BEDTIME 180 tablet 1  . ALPRAZolam (XANAX) 1 MG tablet TAKE 1 TABLET BY MOUTH  DAILY AS NEEDED FOR SEVERE  ANXIETY 15 tablet 1  . calcium carbonate (TUMS - DOSED IN MG ELEMENTAL CALCIUM) 500 MG chewable tablet Chew 1 tablet by mouth daily.    . carbamazepine (CARBATROL) 300 MG 12 hr capsule TAKE 1 CAPSULE BY MOUTH IN  THE MORNING AND 2 CAPSULES  BY MOUTH AT BEDTIME 270 capsule 3  . cetirizine (ZYRTEC) 10 MG tablet Take 10 mg by mouth daily as needed.     . clonazePAM (KLONOPIN) 1 MG tablet Take 1 tablet (1 mg total) by mouth at bedtime. 30 tablet 2  . Deutetrabenazine (AUSTEDO) 12 MG TABS Take 1 tablet po BID with a 9 mg tablet to equal 21 mg po BID 60 tablet 5  . Deutetrabenazine (AUSTEDO) 9 MG TABS Take 1 tab po BID with a 12 mg tab to equal 21 mg BID 60 tablet 5  . fluticasone (FLONASE) 50 MCG/ACT nasal spray Place into both nostrils daily as needed.     . levETIRAcetam (KEPPRA) 500 MG tablet Take 1 tablet (500 mg total) by mouth 2 (two) times daily. 180 tablet 3  . lovastatin (MEVACOR) 20 MG tablet Take 1 tablet (20 mg total) by mouth daily. 90 tablet 3  . Melatonin 10 MG TABS Take 1 tablet by mouth at bedtime. Reported on 01/30/2016    . Multiple Vitamins-Minerals (CENTRUM SILVER PO) Take by mouth.    . OLANZapine (ZYPREXA) 5 MG tablet Take 1 tablet (5 mg total) by mouth at bedtime. Take 1-2 tabs po QHS 90 tablet 3  . polyethylene glycol (MIRALAX / GLYCOLAX) 17 g packet Take 17 g by mouth daily as needed.    . traZODone (DESYREL) 100 MG tablet Take 1-2 tablets (100-200 mg total) by mouth at bedtime as needed. for sleep 180 tablet 3  . zolpidem (AMBIEN) 10 MG tablet TAKE 1 TABLET BY MOUTH AT  BEDTIME AS NEEDED 30 tablet 5   No current  facility-administered medications on file prior to visit.    Review of Systems:  As per HPI- otherwise negative.   Physical Examination: Vitals:   12/04/20 1048  BP: 118/88  Pulse: 86  Resp: 18  Temp: 98 F (36.7 C)  SpO2: 96%   Vitals:   12/04/20 1048  Weight: 180 lb (81.6 kg)  Height: 5\' 6"  (1.676  m)   Body mass index is 29.05 kg/m. Ideal Body Weight: Weight in (lb) to have BMI = 25: 154.6  GEN: no acute distress.  Overweight, looks well  HEENT: Atraumatic, Normocephalic.  Ears and Nose: No external deformity. CV: RRR, No M/G/R. No JVD. No thrill. No extra heart sounds. PULM: CTA B, no wheezes, crackles, rhonchi. No retractions. No resp. distress. No accessory muscle use. ABD: S, NT, ND, +BS. No rebound. No HSM. Belly is benign at this time.  She indicates that she may get epigastric tenderness periodically.  Negative Murphy sign EXTR: No c/c/e PSYCH: Normally interactive. Conversant.    Assessment and Plan: Gastroesophageal reflux disease, unspecified whether esophagitis present - Plan: Comprehensive metabolic panel, H. pylori breath test, pantoprazole (PROTONIX) 40 MG tablet  Vitamin D deficiency - Plan: VITAMIN D 25 Hydroxy (Vit-D Deficiency, Fractures)  Patient today with what sounds like GERD symptoms.  We will check a complete metabolic, H. pylori breath test.  We will have her start Protonix, take daily for 4 to 12 weeks.  She will let me know how this does for her I also recommended eating her evening meal earlier symptoms avoid laying down with a full stomach Also recheck vitamin D level today Will plan further follow- up pending labs.   This visit occurred during the SARS-CoV-2 public health emergency.  Safety protocols were in place, including screening questions prior to the visit, additional usage of staff PPE, and extensive cleaning of exam room while observing appropriate contact time as indicated for disinfecting solutions.    Signed Lamar Blinks, MD  Received her labs as below, message to patient  Results for orders placed or performed in visit on 12/04/20  Comprehensive metabolic panel  Result Value Ref Range   Sodium 135 135 - 145 mEq/L   Potassium 4.8 3.5 - 5.1 mEq/L   Chloride 97 96 - 112 mEq/L   CO2 30 19 - 32 mEq/L   Glucose, Bld 84 70 - 99 mg/dL   BUN 10 6 - 23 mg/dL   Creatinine, Ser 0.82 0.40 - 1.20 mg/dL   Total Bilirubin 0.4 0.2 - 1.2 mg/dL   Alkaline Phosphatase 109 39 - 117 U/L   AST 29 0 - 37 U/L   ALT 38 (H) 0 - 35 U/L   Total Protein 6.8 6.0 - 8.3 g/dL   Albumin 4.3 3.5 - 5.2 g/dL   GFR 75.46 >60.00 mL/min   Calcium 9.1 8.4 - 10.5 mg/dL  VITAMIN D 25 Hydroxy (Vit-D Deficiency, Fractures)  Result Value Ref Range   VITD 31.26 30.00 - 100.00 ng/mL

## 2020-12-02 NOTE — Patient Instructions (Addendum)
It was a pleasure to see you again today- I will be in touch with your labs asap Please let me know how you do with the medication for reflux Ok to take tums as well as needed

## 2020-12-03 ENCOUNTER — Ambulatory Visit: Payer: Medicare Other | Admitting: Psychology

## 2020-12-04 ENCOUNTER — Other Ambulatory Visit: Payer: Self-pay

## 2020-12-04 ENCOUNTER — Encounter: Payer: Self-pay | Admitting: Family Medicine

## 2020-12-04 ENCOUNTER — Ambulatory Visit: Payer: Medicare Other | Attending: Internal Medicine

## 2020-12-04 ENCOUNTER — Ambulatory Visit (INDEPENDENT_AMBULATORY_CARE_PROVIDER_SITE_OTHER): Payer: Medicare Other | Admitting: Family Medicine

## 2020-12-04 VITALS — BP 118/88 | HR 86 | Temp 98.0°F | Resp 18 | Ht 66.0 in | Wt 180.0 lb

## 2020-12-04 DIAGNOSIS — E559 Vitamin D deficiency, unspecified: Secondary | ICD-10-CM

## 2020-12-04 DIAGNOSIS — Z23 Encounter for immunization: Secondary | ICD-10-CM

## 2020-12-04 DIAGNOSIS — K219 Gastro-esophageal reflux disease without esophagitis: Secondary | ICD-10-CM

## 2020-12-04 LAB — COMPREHENSIVE METABOLIC PANEL
ALT: 38 U/L — ABNORMAL HIGH (ref 0–35)
AST: 29 U/L (ref 0–37)
Albumin: 4.3 g/dL (ref 3.5–5.2)
Alkaline Phosphatase: 109 U/L (ref 39–117)
BUN: 10 mg/dL (ref 6–23)
CO2: 30 mEq/L (ref 19–32)
Calcium: 9.1 mg/dL (ref 8.4–10.5)
Chloride: 97 mEq/L (ref 96–112)
Creatinine, Ser: 0.82 mg/dL (ref 0.40–1.20)
GFR: 75.46 mL/min (ref >60.00)
Glucose, Bld: 84 mg/dL (ref 70–99)
Potassium: 4.8 mEq/L (ref 3.5–5.1)
Sodium: 135 mEq/L (ref 135–145)
Total Bilirubin: 0.4 mg/dL (ref 0.2–1.2)
Total Protein: 6.8 g/dL (ref 6.0–8.3)

## 2020-12-04 LAB — VITAMIN D 25 HYDROXY (VIT D DEFICIENCY, FRACTURES): VITD: 31.26 ng/mL (ref 30.00–100.00)

## 2020-12-04 MED ORDER — PANTOPRAZOLE SODIUM 40 MG PO TBEC: 40.0000 mg | DELAYED_RELEASE_TABLET | Freq: Every day | ORAL | 3 refills | Status: DC

## 2020-12-04 NOTE — Progress Notes (Signed)
   Covid-19 Vaccination Clinic  Name:  Theresa [name]    MRN: [id number] DOB: [birth year]  12/04/2020  Theresa [name] was observed post Covid-19 immunization for 15 minutes without incident. She was provided with Vaccine Information Sheet and instruction to access the V-Safe system.   Theresa [name] was instructed to call 911 with any severe reactions post vaccine: Marland Kitchen Difficulty breathing  . Swelling of face and throat  . A fast heartbeat  . A bad rash all over body  . Dizziness and weakness   Immunizations Administered    Name Date Dose VIS Date Route   Moderna Covid-19 Booster Vaccine 12/04/2020 11:52 AM 0.25 mL 06/11/2020 Intramuscular   Manufacturer: Moderna   Lot: [id number]   [place]: [id number]

## 2020-12-05 ENCOUNTER — Encounter: Payer: Self-pay | Admitting: Family Medicine

## 2020-12-05 LAB — H. PYLORI BREATH TEST: H. pylori Breath Test: NOT DETECTED

## 2020-12-08 ENCOUNTER — Other Ambulatory Visit (HOSPITAL_BASED_OUTPATIENT_CLINIC_OR_DEPARTMENT_OTHER): Payer: Self-pay

## 2020-12-08 MED ORDER — MODERNA COVID-19 VACCINE 100 MCG/0.5ML IM SUSP
INTRAMUSCULAR | 0 refills | Status: DC
  Filled 2020-12-08: qty 0.25, 1d supply, fill #0

## 2020-12-12 ENCOUNTER — Other Ambulatory Visit: Payer: Self-pay | Admitting: Psychiatry

## 2020-12-12 DIAGNOSIS — F5101 Primary insomnia: Secondary | ICD-10-CM

## 2020-12-16 ENCOUNTER — Ambulatory Visit (INDEPENDENT_AMBULATORY_CARE_PROVIDER_SITE_OTHER): Payer: Medicare Other | Admitting: Psychology

## 2020-12-16 DIAGNOSIS — F3181 Bipolar II disorder: Secondary | ICD-10-CM | POA: Diagnosis not present

## 2020-12-17 ENCOUNTER — Ambulatory Visit: Payer: Medicare Other | Admitting: Psychology

## 2020-12-26 ENCOUNTER — Other Ambulatory Visit: Payer: Self-pay | Admitting: Psychiatry

## 2020-12-26 DIAGNOSIS — G40309 Generalized idiopathic epilepsy and epileptic syndromes, not intractable, without status epilepticus: Secondary | ICD-10-CM

## 2020-12-29 ENCOUNTER — Encounter: Payer: Self-pay | Admitting: Family Medicine

## 2020-12-31 ENCOUNTER — Ambulatory Visit: Payer: Medicare Other | Admitting: Psychology

## 2021-01-05 ENCOUNTER — Ambulatory Visit: Payer: Medicare Other | Admitting: Psychology

## 2021-01-06 ENCOUNTER — Encounter: Payer: Self-pay | Admitting: Family Medicine

## 2021-01-06 ENCOUNTER — Telehealth (INDEPENDENT_AMBULATORY_CARE_PROVIDER_SITE_OTHER): Payer: Medicare Other | Admitting: Psychiatry

## 2021-01-06 ENCOUNTER — Encounter: Payer: Self-pay | Admitting: Psychiatry

## 2021-01-06 ENCOUNTER — Ambulatory Visit (INDEPENDENT_AMBULATORY_CARE_PROVIDER_SITE_OTHER): Payer: Medicare Other | Admitting: Psychology

## 2021-01-06 DIAGNOSIS — F4310 Post-traumatic stress disorder, unspecified: Secondary | ICD-10-CM

## 2021-01-06 DIAGNOSIS — K219 Gastro-esophageal reflux disease without esophagitis: Secondary | ICD-10-CM

## 2021-01-06 DIAGNOSIS — F319 Bipolar disorder, unspecified: Secondary | ICD-10-CM | POA: Diagnosis not present

## 2021-01-06 DIAGNOSIS — F5101 Primary insomnia: Secondary | ICD-10-CM

## 2021-01-06 DIAGNOSIS — G2401 Drug induced subacute dyskinesia: Secondary | ICD-10-CM

## 2021-01-06 DIAGNOSIS — E2839 Other primary ovarian failure: Secondary | ICD-10-CM

## 2021-01-06 DIAGNOSIS — F3181 Bipolar II disorder: Secondary | ICD-10-CM

## 2021-01-06 MED ORDER — PANTOPRAZOLE SODIUM 40 MG PO TBEC: 40.0000 mg | DELAYED_RELEASE_TABLET | Freq: Every day | ORAL | 3 refills | Status: DC

## 2021-01-06 MED ORDER — ALPRAZOLAM 1 MG PO TABS: ORAL_TABLET | ORAL | 5 refills | Status: DC

## 2021-01-06 MED ORDER — AUSTEDO 12 MG PO TABS: ORAL_TABLET | ORAL | 5 refills | Status: DC

## 2021-01-06 MED ORDER — AUSTEDO 9 MG PO TABS: ORAL_TABLET | ORAL | 5 refills | Status: DC

## 2021-01-06 MED ORDER — CLONAZEPAM 0.5 MG PO TABS: 0.7500 mg | ORAL_TABLET | Freq: Every day | ORAL | 0 refills | Status: DC

## 2021-01-06 MED ORDER — ALPRAZOLAM ER 2 MG PO TB24: ORAL_TABLET | ORAL | 1 refills | Status: DC

## 2021-01-06 NOTE — Progress Notes (Signed)
Theresa [name] [id number] 12/06/1955 65 y.o.  Virtual Visit via Video Note  I connected with pt @ on 01/06/21 at 11:30 AM EDT by a video enabled telemedicine application and verified that I am speaking with the correct person using two identifiers.   I discussed the limitations of evaluation and management by telemedicine and the availability of in person appointments. The patient expressed understanding and agreed to proceed.  I discussed the assessment and treatment plan with the patient. The patient was provided an opportunity to ask questions and all were answered. The patient agreed with the plan and demonstrated an understanding of the instructions.   The patient was advised to call back or seek an in-person evaluation if the symptoms worsen or if the condition fails to improve as anticipated.  I provided 30 minutes of non-face-to-face time during this encounter.  The patient was located at home.  The provider was located at [place].   [name], PMHNP   Subjective:   Patient ID:  Theresa [name] is a 65 y.o. (DOB 1956-01-12) female.  Chief Complaint:  Chief Complaint  Patient presents with  . Follow-up    Anxiety, mood disturbance, insomnia    HPI Willy H Vaux presents for follow-up of anxiety, Bipolar D/O, and insomnia. She reports, "I've been doing good." She reports that her TD has diminished. She reports that she has been taking Xanax XR 2 mg and then took Xanax 1 mg on her trip. She reports that she then stopped taking Xanax 1 mg after she returned from her trip. She reports that she can vacuum now without having jerking. She is now able to sit and stand still. She reports that she rarely notices TD unless she is tired. She notices some slight leg movements. Decreased Olanzapine from 7.5 mg to 5 mg on 12/05/20.  She reports, "mood has been good because I am happy." She reports that her husband questions if she has some manic s/s. She reports that she  felt exhausted several days ago and reports that she had some "sensory overload" with house guests. She reports that she did more talking than usual with guests. She reports that she then slept for an extended period time. She denies any current mania. Denies impulsive or risky behavior. Denies depressed mood.   Sleep has improved with treatment of reflux. She reports that she is sleeping about 8 hours a night and is feeling rested. She reports that she wakes up in the middle of the night for 30-60 minutes and then returns to sleep. Energy and motivation have been ok. Appetite has been ok. She reports that she has had some weight gain and is trying to lose weight. She reports that her concentration has been ok. She has difficulty reading since it is difficult to remember what she has read. She has been reading some. She reports that she has been walking the dogs, learning harmonica, and watching TV. Denies SI.   She has been doing one project a day to build up stamina. She has not been doing much art work. Some of her art work will be shown. She reports that she has been doing different chores and projects that she was not able to do due to TD.   Enjoyed recent trip.   Past Psychiatric Medication Trials: Olanzapine Seroquel Saphris Depakote-self-injurious behavior Lamictal Keppra Carbamazepine Xanax Klonopin Ambien Trazodone Benztropine Ingrezza Propranolol- Ineffective  Review of Systems:  Review of Systems  Gastrointestinal:  Improved reflux since Pantoprazole  Musculoskeletal: Negative for gait problem.  Neurological:       No known seizure activity  Psychiatric/Behavioral:       Please refer to HPI    She reports that she has been having reflux for one month. She reports that she was having vomiting. She reports that when reflux improved with Pantoprazole she decreased Olanzapine on 12/05/20.   Medications: I have reviewed the patient's current medications.  Current  Outpatient Medications  Medication Sig Dispense Refill  . carbamazepine (CARBATROL) 300 MG 12 hr capsule TAKE 1 CAPSULE (300 MG TOTAL) BY MOUTH EVERY MORNING AND 2 CAPSULES (600 MG TOTAL) AT BEDTIME. 90 capsule 0  . clonazePAM (KLONOPIN) 0.5 MG tablet Take 1.5 tablets (0.75 mg total) by mouth at bedtime. Reduce to 0.5 mg at bedtime after one month 45 tablet 0  . ALPRAZolam (XANAX XR) 2 MG 24 hr tablet TAKE 1 TABLET BY MOUTH IN  THE MORNING AND AT BEDTIME 180 tablet 1  . ALPRAZolam (XANAX) 1 MG tablet TAKE 1 TABLET BY MOUTH  DAILY AS NEEDED FOR SEVERE  ANXIETY 15 tablet 5  . calcium carbonate (TUMS - DOSED IN MG ELEMENTAL CALCIUM) 500 MG chewable tablet Chew 1 tablet by mouth daily.    . cetirizine (ZYRTEC) 10 MG tablet Take 10 mg by mouth daily as needed.     Marland Kitchen COVID-19 mRNA vaccine, Moderna, (MODERNA COVID-19 VACCINE) 100 MCG/0.5ML injection Inject into the muscle. 0.25 mL 0  . Deutetrabenazine (AUSTEDO) 12 MG TABS Take 1 tablet po BID with a 9 mg tablet to equal 21 mg po BID 60 tablet 5  . Deutetrabenazine (AUSTEDO) 9 MG TABS Take 1 tab po BID with a 12 mg tab to equal 21 mg BID 60 tablet 5  . fluticasone (FLONASE) 50 MCG/ACT nasal spray Place into both nostrils daily as needed.     . levETIRAcetam (KEPPRA) 500 MG tablet Take 1 tablet (500 mg total) by mouth 2 (two) times daily. 180 tablet 3  . lovastatin (MEVACOR) 20 MG tablet Take 1 tablet (20 mg total) by mouth daily. 90 tablet 3  . Melatonin 10 MG TABS Take 1 tablet by mouth at bedtime. Reported on 01/30/2016    . Multiple Vitamins-Minerals (CENTRUM SILVER PO) Take by mouth.    . OLANZapine (ZYPREXA) 5 MG tablet Take 1 tablet (5 mg total) by mouth at bedtime. Take 1-2 tabs po QHS 90 tablet 3  . pantoprazole (PROTONIX) 40 MG tablet Take 1 tablet (40 mg total) by mouth daily. Take for 4- 12 weeks as needed 90 tablet 3  . polyethylene glycol (MIRALAX / GLYCOLAX) 17 g packet Take 17 g by mouth daily as needed.    . traZODone (DESYREL) 100 MG  tablet Take 1-2 tablets (100-200 mg total) by mouth at bedtime as needed. for sleep 180 tablet 3  . zolpidem (AMBIEN) 10 MG tablet TAKE 1 TABLET BY MOUTH AT  BEDTIME AS NEEDED 30 tablet 5   No current facility-administered medications for this visit.    Medication Side Effects: Other: TD  Allergies:  Allergies  Allergen Reactions  . Lamictal [Lamotrigine]     Acute renal failure  . Ropinirole Nausea And Vomiting  . Tramadol   . Codeine Swelling and Rash    Can take hydrocodone  . Penicillins     Abdominal pain    Past Medical History:  Diagnosis Date  . Adenomatous colon polyp   . Allergy   . Anxiety   .  Bipolar 1 disorder ([place])   . Blood transfusion without reported diagnosis   . Depression   . Elevated LFTs   . Hepatic steatosis 05/07/13  . Hyperlipidemia   . Seizures ([place])   . Tardive dyskinesia     Family History  Problem Relation Age of Onset  . Colon polyps Mother   . Cancer Mother   . Heart disease Mother   . Alcoholism Mother   . Cancer Father   . Alcoholism Father   . Bipolar disorder Father   . Drug abuse Brother   . Bipolar disorder Other   . Colon cancer Neg Hx   . Stomach cancer Neg Hx   . Breast cancer Neg Hx     Social History   Socioeconomic History  . Marital status: Married    Spouse name: Not on file  . Number of children: Not on file  . Years of education: Not on file  . Highest education level: Not on file  Occupational History  . Not on file  Tobacco Use  . Smoking status: Never Smoker  . Smokeless tobacco: Never Used  Vaping Use  . Vaping Use: Never used  Substance and Sexual Activity  . Alcohol use: Yes    Comment: occasionaly 1 a month  . Drug use: No  . Sexual activity: Not Currently  Other Topics Concern  . 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 Difficulty of Paying Living Expenses: Not very hard  Food Insecurity: No Food Insecurity  . Worried About Charity fundraiser in the Last Year: Never true  . Ran Out of Food in the Last Year: Never true  Transportation Needs: No Transportation Needs  . Lack of Transportation (Medical): No  . Lack of Transportation (Non-Medical): No  Physical Activity: Not on file  Stress: Not on file  Social Connections: Not on file  Intimate Partner Violence: Not At Risk  . Fear of Current or Ex-Partner: No  . Emotionally Abused: No  . Physically Abused: No  . Sexually Abused: No    Past Medical History, Surgical history, Social history, and Family history were reviewed and updated as appropriate.   Please see review of systems for further details on the patient's review from today.   Objective:   Physical Exam:  There were no vitals taken for this visit.  Physical Exam Neurological:     Mental Status: She is alert and oriented to person, place, and time.     Cranial Nerves: No dysarthria.  Psychiatric:        Attention and Perception: Attention and perception normal.        Mood and Affect: Mood normal.        Speech: Speech normal.        Behavior: Behavior is cooperative.        Thought Content: Thought content normal. Thought content is not paranoid or delusional. Thought content does not include homicidal or suicidal ideation. Thought content does not include homicidal or suicidal plan.        Cognition and Memory: Cognition and memory normal.        Judgment: Judgment normal.     Comments: Insight intact     Lab Review:  Component Value Date/Time   NA 135 12/04/2020 1107   K 4.8 12/04/2020 1107   CL 97 12/04/2020 1107   CO2 30 12/04/2020 1107   GLUCOSE 84 12/04/2020 1107   BUN 10 12/04/2020 1107   CREATININE 0.82 12/04/2020 1107   CREATININE 0.79 05/08/2020 1015   CALCIUM 9.1 12/04/2020 1107   PROT 6.8 12/04/2020 1107   ALBUMIN 4.3  12/04/2020 1107   AST 29 12/04/2020 1107   ALT 38 (H) 12/04/2020 1107   ALKPHOS 109 12/04/2020 1107   BILITOT 0.4 12/04/2020 1107       Component Value Date/Time   WBC 4.8 05/08/2020 1015   RBC 4.94 05/08/2020 1015   HGB 14.7 05/08/2020 1015   HCT 42.8 05/08/2020 1015   PLT 188 05/08/2020 1015   MCV 86.6 05/08/2020 1015   MCV 87.7 02/07/2013 1110   MCH 29.8 05/08/2020 1015   MCHC 34.3 05/08/2020 1015   RDW 12.8 05/08/2020 1015    No results found for: POCLITH, LITHIUM   Lab Results  Component Value Date   CBMZ 5.2 02/01/2019     .res Assessment: Plan:   Attempt dose reduction of Klonopin from 1 mg to 0.75 mg po QHS for one month, then decrease to 0.5 mg po QHS if no worsening in anxiety or insomnia. Discussed resuming previous dose if sleep, anxiety, or mood worsens.  Continue [place] 21 mg po BID for TID for TD. Continue Xanax XR 2 mg po BID for anxiety.  Continue Xanax 1 mg po qd prn severe anxiety.  Continue Olanzapine 5 mg po QHS for mood, anxiety, and insomnia.  Continue Trazodone 100 mg 1-2 tabs po QHS prn insomnia.  Continue Ambien 10 mg po QHS prn insomnia.  Pt to follow-up in 2 months or sooner if clinically indicated.  Patient advised to contact office with any questions, adverse effects, or acute worsening in signs and symptoms.  DX: PTSD Bipolar D/O Insomnia TD     Please see After Visit Summary for patient specific instructions.  Future Appointments  Date Time Provider [place]  01/26/2021 11:00 AM Natividad Brood, LCSW [facility] None  02/16/2021 11:00 AM Natividad Brood, LCSW [facility] None  03/09/2021 11:00 AM Natividad Brood, LCSW [facility] None  03/09/2021  1:15 PM [name], PMHNP [facility] None  03/30/2021 11:00 AM Natividad Brood, LCSW [facility] None  04/20/2021 11:00 AM Natividad Brood, LCSW [facility] None  05/11/2021 11:00 AM Natividad Brood, LCSW [facility] None  06/01/2021 11:00 AM Natividad Brood, LCSW [facility] None  06/22/2021  11:00 AM Natividad Brood, LCSW [facility] None  07/13/2021 11:00 AM Natividad Brood, LCSW [facility] None  08/03/2021 11:00 AM Natividad Brood, LCSW [facility] None  09/14/2021 11:00 AM Natividad Brood, LCSW [facility] None  09/29/2021  3:30 PM Cameron Sprang, MD [facility] None    No orders of the defined types were placed in this encounter.     -------------------------------

## 2021-01-06 NOTE — Addendum Note (Signed)
Addended by: Darreld Mclean on: 01/06/2021 06:53 PM   Modules accepted: Orders

## 2021-01-12 ENCOUNTER — Other Ambulatory Visit: Payer: Self-pay

## 2021-01-12 ENCOUNTER — Telehealth: Payer: Self-pay | Admitting: Psychiatry

## 2021-01-12 ENCOUNTER — Encounter: Payer: Self-pay | Admitting: Family Medicine

## 2021-01-12 ENCOUNTER — Ambulatory Visit (HOSPITAL_BASED_OUTPATIENT_CLINIC_OR_DEPARTMENT_OTHER)
Admission: RE | Admit: 2021-01-12 | Discharge: 2021-01-12 | Disposition: A | Discharge status: Home / Self Care | Payer: Medicare Other | Source: Ambulatory Visit | Attending: Family Medicine | Admitting: Family Medicine

## 2021-01-12 DIAGNOSIS — Z780 Asymptomatic menopausal state: Secondary | ICD-10-CM | POA: Diagnosis not present

## 2021-01-12 DIAGNOSIS — E2839 Other primary ovarian failure: Secondary | ICD-10-CM | POA: Diagnosis not present

## 2021-01-12 NOTE — Telephone Encounter (Signed)
Pt requesting new Rx for Clonazepam .05 mg to [facility]. She cuts in half easier. Contact # [phone]

## 2021-01-12 NOTE — Telephone Encounter (Signed)
Pt has Rx filled with [facility] for 0.5 mg clonazepam #45 on 01/07/21  Unless she is asking for next refill she shouldn't need any.

## 2021-01-14 ENCOUNTER — Ambulatory Visit: Payer: Medicare Other | Admitting: Psychology

## 2021-01-26 ENCOUNTER — Ambulatory Visit (INDEPENDENT_AMBULATORY_CARE_PROVIDER_SITE_OTHER): Payer: Medicare Other | Admitting: Psychology

## 2021-01-26 DIAGNOSIS — F3181 Bipolar II disorder: Secondary | ICD-10-CM | POA: Diagnosis not present

## 2021-02-16 ENCOUNTER — Telehealth: Payer: Self-pay | Admitting: Psychiatry

## 2021-02-16 ENCOUNTER — Ambulatory Visit (INDEPENDENT_AMBULATORY_CARE_PROVIDER_SITE_OTHER): Payer: Medicare Other | Admitting: Psychology

## 2021-02-16 DIAGNOSIS — F3181 Bipolar II disorder: Secondary | ICD-10-CM | POA: Diagnosis not present

## 2021-02-16 NOTE — Telephone Encounter (Signed)
Pt left a message asking for recommendations for a therapist. She has been going to one 7 visits and now that therapist is leaving. She would like to know if anyone here could see her or if [name] knows about anyone else she could see

## 2021-02-16 NOTE — Telephone Encounter (Signed)
Please review

## 2021-02-18 NOTE — Telephone Encounter (Signed)
Pt has apt w/ DD 8/4

## 2021-02-20 ENCOUNTER — Other Ambulatory Visit: Payer: Self-pay | Admitting: Psychiatry

## 2021-02-20 DIAGNOSIS — F4310 Post-traumatic stress disorder, unspecified: Secondary | ICD-10-CM

## 2021-02-20 DIAGNOSIS — F5101 Primary insomnia: Secondary | ICD-10-CM

## 2021-02-20 NOTE — Telephone Encounter (Signed)
Last filled 5/17 appt on 7/18

## 2021-02-24 ENCOUNTER — Other Ambulatory Visit: Payer: Self-pay | Admitting: Psychiatry

## 2021-02-24 DIAGNOSIS — F5101 Primary insomnia: Secondary | ICD-10-CM

## 2021-02-27 ENCOUNTER — Encounter: Payer: Self-pay | Admitting: Family Medicine

## 2021-02-27 ENCOUNTER — Other Ambulatory Visit: Payer: Self-pay | Admitting: Obstetrics and Gynecology

## 2021-02-27 DIAGNOSIS — Z1231 Encounter for screening mammogram for malignant neoplasm of breast: Secondary | ICD-10-CM

## 2021-03-09 ENCOUNTER — Ambulatory Visit: Payer: Medicare Other | Admitting: Psychiatry

## 2021-03-09 ENCOUNTER — Ambulatory Visit: Payer: Medicare Other | Admitting: Psychology

## 2021-03-09 ENCOUNTER — Other Ambulatory Visit: Payer: Self-pay

## 2021-03-09 ENCOUNTER — Encounter: Payer: Self-pay | Admitting: Psychiatry

## 2021-03-09 DIAGNOSIS — F5101 Primary insomnia: Secondary | ICD-10-CM | POA: Diagnosis not present

## 2021-03-09 DIAGNOSIS — G40309 Generalized idiopathic epilepsy and epileptic syndromes, not intractable, without status epilepticus: Secondary | ICD-10-CM | POA: Diagnosis not present

## 2021-03-09 MED ORDER — CARBAMAZEPINE ER 300 MG PO CP12: ORAL_CAPSULE | ORAL | 0 refills | Status: DC

## 2021-03-09 MED ORDER — ZOLPIDEM TARTRATE 10 MG PO TABS: 10.0000 mg | ORAL_TABLET | Freq: Every evening | ORAL | 4 refills | Status: DC | PRN

## 2021-03-09 NOTE — Progress Notes (Signed)
   03/09/21 1346  Facial and Oral Movements  Muscles of Facial Expression 0  Lips and Perioral Area 0  Jaw 0  Tongue 2  Extremity Movements  Upper (arms, wrists, hands, fingers) 0  Lower (legs, knees, ankles, toes) 0  Trunk Movements  Neck, shoulders, hips 0  Overall Severity  Severity of abnormal movements (highest score from questions above) 2  Incapacitation due to abnormal movements 3  Patient's awareness of abnormal movements (rate only patient's report) 3  AIMS Total Score  AIMS Total Score 10

## 2021-03-09 NOTE — Progress Notes (Signed)
Theresa [name] [id number] 1955/11/29 65 y.o.  Subjective:   Patient ID:  Theresa [name] is a 65 y.o. (DOB 05-12-1956) female.  Chief Complaint:  Chief Complaint  Patient presents with   Follow-up    Bipolar disorder, anxiety, and sleep disturbance     HPI Theresa [name] presents to the office today for follow-up of Bipolar D/O, anxiety, and sleep disturbance. Theresa [name] reports, "medication wise I am doing ok." Theresa [name] denies any recent panic or trauma s/s. Theresa [name] reports that her mood has been stable. Friend died of kidney cancer and Theresa [name] is trying to support his wife. Notices some sadness after this. Theresa [name] reports "some" manic s/s at times with increased difficulty sleeping and feeling increasingly talkative. Husband recognizes Theresa [name] will want to have the same conversation several times from different angles when Theresa [name] is manic. Husband reports that when Theresa [name] is manic Theresa [name] will talk to him in an excited fashion when he comes in from working outside. They report that her mood will cycle every 3-4 days with some periods of euthymia. Sleeping well and estimates sleeping about 7 hours regularly. Motivation has been "very low" and has not been painting or having the desire to paint. Appetite has been good. Concentration has been ok. Denies SI.   Husband reports that they had a busy weekend with friend's funeral.   They report that Theresa [name] had a migraine for 3 days about a week ago. Slept more while Theresa [name] had migraines.   Theresa [name] reports that Theresa [name] experiences some TD and that it had remitted and then reoccurred. Theresa [name] reports that overall TD is significantly improved compared to the past. Reports difficulty standing still. Notices  Theresa [name] is now taking Klonopin 0.5 mg at bedtime and denies any worsening s/s with dose reduction. Theresa [name] took Xanax IR 1 mg on rare occasions, such as prior to friend's funeral. Theresa [name] reports that Theresa [name] has difficulty sleeping with Trazodone 100 mg and sleep is adequate with 200 mg.   Theresa [name] has been walking with  the dogs and exercising on stationary bike.   Past Psychiatric Medication Trials: Olanzapine Seroquel Saphris Depakote-self-injurious behavior Lamictal Keppra Carbamazepine Xanax Klonopin Ambien Trazodone Benztropine Ingrezza Propranolol- Ineffective   AIMS    Flowsheet Row Office Visit from 03/09/2021 in [place] Office Visit from 09/30/2020 in [place] Office Visit from 06/16/2020 in [place] Visit from 04/03/2020 in [place] Visit from 03/06/2020 in [place] Total Score 10 9 8 14 19       [place] Office Visit from 02/28/2017 in [place] at [place]  Total Score (max 30 points ) 29      PHQ2-9    [place] Visit from 02/28/2017 in [place] at [place] Visit from 09/19/2015 in [facility] at [place] from 06/05/2015 in [place] at [place] from 03/10/2015 in [facility] at [facility] Total Score 0 0 0 0        Review of Systems:  Review of Systems  Respiratory:  Negative for cough.   Gastrointestinal:        Improved reflux with Pantoprazole  Musculoskeletal:  Negative for gait problem.  Neurological:  Negative for seizures.       Had 3 day migraine  Psychiatric/Behavioral:         Please refer to HPI   Medications: I have reviewed the  patient's current medications.  Current Outpatient Medications  Medication Sig Dispense Refill   ALPRAZolam (XANAX XR) 2 MG 24 hr tablet TAKE 1 TABLET BY MOUTH IN  THE MORNING AND AT BEDTIME 180 tablet 1   ALPRAZolam (XANAX) 1 MG tablet TAKE 1 TABLET BY MOUTH  DAILY AS NEEDED FOR SEVERE  ANXIETY 15 tablet 5   cetirizine (ZYRTEC) 10 MG tablet Take 10 mg by mouth daily as needed.      clonazePAM (KLONOPIN) 0.5 MG tablet TAKE 1 AND 1/2 TABLETS BY  MOUTH AT BEDTIME FOR 1  MONTH, THEN REDUCE  TO 1  TABLET AT BEDTIME (Patient taking differently: 0.5 mg.) 45 tablet 5   Deutetrabenazine (AUSTEDO) 12 MG TABS Take 1 tablet po BID with a 9 mg tablet to equal 21 mg po BID 60 tablet 5   Deutetrabenazine (AUSTEDO) 9 MG TABS Take 1 tab po BID with a 12 mg tab to equal 21 mg BID 60 tablet 5   fluticasone (FLONASE) 50 MCG/ACT nasal spray Place into both nostrils daily as needed.      levETIRAcetam (KEPPRA) 500 MG tablet Take 1 tablet (500 mg total) by mouth 2 (two) times daily. 180 tablet 3   lovastatin (MEVACOR) 20 MG tablet Take 1 tablet (20 mg total) by mouth daily. 90 tablet 3   Melatonin 10 MG TABS Take 1 tablet by mouth at bedtime. Reported on 01/30/2016     Multiple Vitamins-Minerals (CENTRUM SILVER PO) Take by mouth.     pantoprazole (PROTONIX) 40 MG tablet Take 1 tablet (40 mg total) by mouth daily. Take for 4- 12 weeks as needed 90 tablet 3   polyethylene glycol (MIRALAX / GLYCOLAX) 17 g packet Take 17 g by mouth daily as needed.     traZODone (DESYREL) 100 MG tablet Take 1-2 tablets (100-200 mg total) by mouth at bedtime as needed. for sleep 180 tablet 3   carbamazepine (CARBATROL) 300 MG 12 hr capsule Take 1 capsule (300 mg total) by mouth every morning AND 2 capsules (600 mg total) at bedtime. 270 capsule 0   COVID-19 mRNA vaccine, Moderna, (MODERNA COVID-19 VACCINE) 100 MCG/0.5ML injection Inject into the muscle. 0.25 mL 0   OLANZapine (ZYPREXA) 5 MG tablet Take 1 tablet (5 mg total) by mouth at bedtime. Take 1-2 tabs po QHS 90 tablet 3   [START ON 04/17/2021] zolpidem (AMBIEN) 10 MG tablet Take 1 tablet (10 mg total) by mouth at bedtime as needed. 30 tablet 4   No current facility-administered medications for this visit.    Medication Side Effects: Other: Wt gain  Allergies:  Allergies  Allergen Reactions   Lamictal [Lamotrigine]     Acute renal failure   Ropinirole Nausea And Vomiting   Tramadol    Codeine Swelling and Rash    Can take hydrocodone   Penicillins      Abdominal pain    Past Medical History:  Diagnosis Date   Adenomatous colon polyp    Allergy    Anxiety    Bipolar 1 disorder ([place])    Blood transfusion without reported diagnosis    Depression    Elevated LFTs    Hepatic steatosis 05/07/13   Hyperlipidemia    Seizures (HCC)    Tardive dyskinesia     Past Medical History, Surgical history, Social history, and Family history were reviewed and updated as appropriate.   Please see review of systems for further details on the patient's review from today.   Objective:   Physical  Exam:  There were no vitals taken for this visit.  Physical Exam Constitutional:      General: Theresa [name] is not in acute distress. Musculoskeletal:        General: No deformity.  Neurological:     Mental Status: Theresa [name] is alert and oriented to person, place, and time.     Coordination: Coordination normal.  Psychiatric:        Attention and Perception: Attention and perception normal. Theresa [name] does not perceive auditory or visual hallucinations.        Mood and Affect: Mood normal. Mood is not anxious or depressed. Affect is not labile, blunt, angry or inappropriate.        Speech: Speech normal.        Behavior: Behavior normal.        Thought Content: Thought content normal. Thought content is not paranoid or delusional. Thought content does not include homicidal or suicidal ideation. Thought content does not include homicidal or suicidal plan.        Cognition and Memory: Cognition and memory normal.        Judgment: Judgment normal.     Comments: Insight intact    Lab Review:     Component Value Date/Time   NA 135 12/04/2020 1107   K 4.8 12/04/2020 1107   CL 97 12/04/2020 1107   CO2 30 12/04/2020 1107   GLUCOSE 84 12/04/2020 1107   BUN 10 12/04/2020 1107   CREATININE 0.82 12/04/2020 1107   CREATININE 0.79 05/08/2020 1015   CALCIUM 9.1 12/04/2020 1107   PROT 6.8 12/04/2020 1107   ALBUMIN 4.3 12/04/2020 1107   AST 29 12/04/2020 1107   ALT 38 (H)  12/04/2020 1107   ALKPHOS 109 12/04/2020 1107   BILITOT 0.4 12/04/2020 1107       Component Value Date/Time   WBC 4.8 05/08/2020 1015   RBC 4.94 05/08/2020 1015   HGB 14.7 05/08/2020 1015   HCT 42.8 05/08/2020 1015   PLT 188 05/08/2020 1015   MCV 86.6 05/08/2020 1015   MCV 87.7 02/07/2013 1110   MCH 29.8 05/08/2020 1015   MCHC 34.3 05/08/2020 1015   RDW 12.8 05/08/2020 1015    No results found for: POCLITH, LITHIUM   Lab Results  Component Value Date   CBMZ 5.2 02/01/2019     .res Assessment: Plan:   Patient seen for 30 minutes and time spent discussing her questions about possibly reducing medications.  Recommended not reducing medications at this time due to worsening mood signs and symptoms with previous dose reductions and since Theresa [name] is describing some mood cycling.   Discussed plan to start psychotherapy with Rinaldo Cloud, LCSW. Continue carbamazepine 300 mg in the morning and 600 mg at bedtime for mood stabilization. Continue Ambien 10 mg at bedtime as needed for insomnia. Continue Klonopin 0.5 mg at bedtime for insomnia. Continue Xanax XR 2 mg twice daily for anxiety. Continue Xanax 1 mg daily as needed for severe anxiety. Continue [place] 21 mg twice daily for tardive dyskinesia. Continue olanzapine 5 mg at bedtime for mood stabilization. Continue trazodone 100 mg 1-2 tabs at bedtime as needed for insomnia. Patient to follow-up in 2 months or sooner if clinically indicated. Patient advised to contact office with any questions, adverse effects, or acute worsening in signs and symptoms.     Theresa [name] was seen today for follow-up.  Diagnoses and all orders for this visit:  Generalized idiopathic epilepsy and epileptic syndromes, not intractable, without status epilepticus ([place]) -  carbamazepine (CARBATROL) 300 MG 12 hr capsule; Take 1 capsule (300 mg total) by mouth every morning AND 2 capsules (600 mg total) at bedtime.  Primary insomnia -     zolpidem  (AMBIEN) 10 MG tablet; Take 1 tablet (10 mg total) by mouth at bedtime as needed.    Please see After Visit Summary for patient specific instructions.  Future Appointments  Date Time Provider [place]  03/26/2021 11:00 AM Shanon Ace, LCSW CP-CP None  04/23/2021 10:30 AM [facility] MM 3 GI-BCGMM GI-[facility]  05/11/2021 11:00 AM [name], PMHNP CP-CP None  05/14/2021  9:40 AM Copland, Gay Filler, MD [facility]-SW P[facility]  09/29/2021  3:30 PM Cameron Sprang, MD LBN-[facility] None    No orders of the defined types were placed in this encounter.   -------------------------------

## 2021-03-26 ENCOUNTER — Other Ambulatory Visit: Payer: Self-pay

## 2021-03-26 ENCOUNTER — Ambulatory Visit (INDEPENDENT_AMBULATORY_CARE_PROVIDER_SITE_OTHER): Payer: Medicare Other | Admitting: Psychiatry

## 2021-03-26 DIAGNOSIS — F319 Bipolar disorder, unspecified: Secondary | ICD-10-CM | POA: Diagnosis not present
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
## 2021-03-30 ENCOUNTER — Ambulatory Visit: Payer: Medicare Other | Admitting: Psychology

## 2021-04-13 ENCOUNTER — Other Ambulatory Visit: Payer: Self-pay | Admitting: Psychiatry

## 2021-04-13 DIAGNOSIS — F4310 Post-traumatic stress disorder, unspecified: Secondary | ICD-10-CM

## 2021-04-14 NOTE — Telephone Encounter (Signed)
Last seen 7/18.Appt on 8/4

## 2021-04-16 ENCOUNTER — Telehealth: Payer: Self-pay | Admitting: Psychiatry

## 2021-04-16 NOTE — Telephone Encounter (Signed)
Next visit is 05/11/21. Theresa [name] said that she has rapid cycling and it is giving her some trouble. Could this be from her medicines? Her phone number is [phone]. Pharmacy is:  [id number] [place], [place] [address] Phone:  [phone] Fax:  [phone]

## 2021-04-16 NOTE — Telephone Encounter (Signed)
Rtn to pt and she reports for about 2 mornings, she woke up felt fine took her am medications then within 45 minutes she slept all morning. Then one morning she woke up at 3 am and was definitely manic. Not sure if the medication is causing her to feel tired after taking? No changes recently according to pt or notes.

## 2021-04-17 NOTE — Telephone Encounter (Signed)
Well I spoke with Theresa [name] then her husband got on the phone and gave the "real" situation. Yes on mania, it's been escalating past few weeks. Her medications are correct with AM/PM. He also reports in August she has been hospitalized a few times, so for whatever reason this time of year things worsen. Pt probably needs medication adjustment. Her next apt isn't until 9/19. Informed her husband and pt I would let [name] know and follow up with recommendation.

## 2021-04-17 NOTE — Telephone Encounter (Signed)
Returned call to pt. She reports that she has not been sleeping at night and then goes back to sleep after taking morning medication.   She reports that there were a few social events that could have triggered mania. Husband reports that she has been on "a big rise to mania" in recent weeks. She denies impulsive or risky behavior. She reports that she is occasionally waking up at night and researching some things on the computer.   Taking Olanzapine 5 mg po qhs.   Will increase Olanzapine to 10 mg po QHS.   Requested she call office back on Monday with update.

## 2021-04-20 ENCOUNTER — Ambulatory Visit: Payer: Medicare Other | Admitting: Psychology

## 2021-04-21 ENCOUNTER — Telehealth: Payer: Self-pay | Admitting: Psychiatry

## 2021-04-21 DIAGNOSIS — G40309 Generalized idiopathic epilepsy and epileptic syndromes, not intractable, without status epilepticus: Secondary | ICD-10-CM

## 2021-04-21 DIAGNOSIS — F5101 Primary insomnia: Secondary | ICD-10-CM

## 2021-04-21 DIAGNOSIS — F4310 Post-traumatic stress disorder, unspecified: Secondary | ICD-10-CM

## 2021-04-21 MED ORDER — CLONAZEPAM 0.5 MG PO TABS: 1.0000 mg | ORAL_TABLET | Freq: Every day | ORAL | 5 refills | Status: DC

## 2021-04-21 MED ORDER — CARBAMAZEPINE ER 300 MG PO CP12: 600.0000 mg | ORAL_CAPSULE | Freq: Two times a day (BID) | ORAL | 0 refills | Status: DC

## 2021-04-21 NOTE — Telephone Encounter (Signed)
Returned call to pt. He reports that she slept well Friday night. He reports that her sleep was reduced the last 2 nights and last night slept 2 hours and felt "perfectly awake." She reports that she feels tired. She reports that she is having racing thoughts when she is supposed to be sleeping. Denies impulsive or risky behavior. Denies SI.   Reports that she had increased TD last night and this has since "calmed down."    Increase Klonopin to 1 mg at bedtime.   Increase Carbamazepine to 600 mg twice daily.   Patient advised to contact office with any questions, adverse effects, or acute worsening in signs and symptoms.

## 2021-04-21 NOTE — Telephone Encounter (Signed)
Next visit is 05/11/21. [name] called as her husband encouraged her to. She has slept thru the past two nights but didn't sleep last night. Both her and husband think she is still manic and going up and down. Please call her at [phone].

## 2021-04-21 NOTE — Telephone Encounter (Signed)
Pt stated you had her increase the olanzapine on Friday and she slept well the first night but over the weekend she has not been able to sleep at all.She is manic and her husband told her it was bad.She is asking for another med adjustment or to possibly see you sooner.

## 2021-04-23 ENCOUNTER — Ambulatory Visit
Admission: RE | Admit: 2021-04-23 | Discharge: 2021-04-23 | Disposition: A | Discharge status: Home / Self Care | Payer: Medicare Other | Source: Ambulatory Visit | Attending: Obstetrics and Gynecology | Admitting: Obstetrics and Gynecology

## 2021-04-23 ENCOUNTER — Other Ambulatory Visit: Payer: Self-pay

## 2021-04-23 DIAGNOSIS — Z1231 Encounter for screening mammogram for malignant neoplasm of breast: Secondary | ICD-10-CM | POA: Diagnosis not present

## 2021-04-24 ENCOUNTER — Telehealth: Payer: Self-pay | Admitting: Psychiatry

## 2021-04-24 DIAGNOSIS — G40309 Generalized idiopathic epilepsy and epileptic syndromes, not intractable, without status epilepticus: Secondary | ICD-10-CM

## 2021-04-24 DIAGNOSIS — F319 Bipolar disorder, unspecified: Secondary | ICD-10-CM

## 2021-04-24 MED ORDER — CARBAMAZEPINE ER 300 MG PO CP12: ORAL_CAPSULE | ORAL | 0 refills | Status: DC

## 2021-04-24 MED ORDER — OLANZAPINE 15 MG PO TABS: 15.0000 mg | ORAL_TABLET | Freq: Every day | ORAL | 0 refills | Status: DC

## 2021-04-24 NOTE — Telephone Encounter (Signed)
Spoke to [employer] and her husband.After meds were increased pt began having side effects.She reports double vision,dizziness and nausea.She also has some balance issues and unable to walk without stumbling.She stated this morning the only med she took was for her tardive dyskinesia and she vomited.

## 2021-04-24 NOTE — Telephone Encounter (Signed)
Case staffed with Dr. Clovis Pu.  Attempted to reach patient's husband at [phone].  No answer.  Attempted to reach patient at [phone]. L/M re: medication instructions.  Spoke with husband. He reports that she had dizziness yesterday. Stumbled last night when going to bathroom. He reports that her balance is starting to improve. He reports that she vomited this morning. He reports that she took Carbamazepine yesterday morning without food.   Resume Carbamazepine 300 mg po q am and 600 mg po QHS.   Husband reports that she slept well Tuesday, 04/21/21. He reports that acute manic s/s seem to be improving.   Discussed that a prescription for olanzapine 15 mg has been sent to local pharmacy to be used if manic symptoms escalate or do not seem to improve.  Advised husband to call back with any worsening signs and symptoms.

## 2021-04-24 NOTE — Telephone Encounter (Signed)
Husband called and said that they have increased the doses that was recommended. [name] was doing fine until yesterday. She became dizzy and fell several times going to the bathroom. Please call donald back at [phone]

## 2021-04-25 ENCOUNTER — Other Ambulatory Visit: Payer: Self-pay | Admitting: Psychiatry

## 2021-04-25 DIAGNOSIS — F319 Bipolar disorder, unspecified: Secondary | ICD-10-CM

## 2021-04-28 ENCOUNTER — Other Ambulatory Visit (HOSPITAL_BASED_OUTPATIENT_CLINIC_OR_DEPARTMENT_OTHER): Payer: Self-pay

## 2021-05-04 ENCOUNTER — Other Ambulatory Visit: Payer: Self-pay

## 2021-05-04 ENCOUNTER — Ambulatory Visit: Payer: Medicare Other | Admitting: Psychiatry

## 2021-05-04 DIAGNOSIS — F319 Bipolar disorder, unspecified: Secondary | ICD-10-CM | POA: Diagnosis not present
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
## 2021-05-11 ENCOUNTER — Ambulatory Visit: Payer: Medicare Other | Admitting: Psychiatry

## 2021-05-11 ENCOUNTER — Encounter: Payer: Self-pay | Admitting: Psychiatry

## 2021-05-11 ENCOUNTER — Other Ambulatory Visit: Payer: Self-pay

## 2021-05-11 ENCOUNTER — Ambulatory Visit: Payer: Medicare Other | Admitting: Psychology

## 2021-05-11 DIAGNOSIS — F5101 Primary insomnia: Secondary | ICD-10-CM | POA: Diagnosis not present

## 2021-05-11 DIAGNOSIS — F4310 Post-traumatic stress disorder, unspecified: Secondary | ICD-10-CM | POA: Diagnosis not present

## 2021-05-11 MED ORDER — CLONAZEPAM 1 MG PO TABS: 1.0000 mg | ORAL_TABLET | Freq: Every day | ORAL | 1 refills | Status: DC

## 2021-05-11 MED ORDER — ALPRAZOLAM 1 MG PO TABS: ORAL_TABLET | ORAL | 2 refills | Status: DC

## 2021-05-11 MED ORDER — TRAZODONE HCL 100 MG PO TABS: 100.0000 mg | ORAL_TABLET | Freq: Every evening | ORAL | 3 refills | Status: DC | PRN

## 2021-05-11 NOTE — Progress Notes (Signed)
Theresa [name] [id number] 1956-05-20 65 y.o.  Subjective:   Patient ID:  Theresa [name] is a 65 y.o. (DOB August 03, 1956) female.  Chief Complaint:  Chief Complaint  Patient presents with   Manic Behavior   Insomnia    Insomnia  Theresa [name] presents to the office today for follow-up of mania and sleep disturbance. "I'm a mess."  Last night slept 2 hours. Reports that some nights she sleeps ok and other nights sleep is poor. Slept well Friday night. Slept hard Friday during the day. Slept poorly Thursday. She reports that her mood may be cycling quickly. She reports that she is feeling "really tired," typically after poor sleep. Denies significant depression or sadness. She reports, "I haven't been really anxious." She denies impulsive or risky behavior. Husband reports that she will think about different things without following through. He reports that at times she is over extending herself. He reports that she has been planning a "puppy party" for prospective owners. He reports that she is sending daily updates after thinking about a new piece of information. He reports that he will offer to take care of things and she will continue to be involved and cannot wait. They reports that she is physically inactive and impulsivity will not manifest as physically overdoing things. She reports that she has to occasionally move at a faster pace due to Tardive dyskinesia. Husband reports that [name] is a "quiet person and will wait until the right time" to talk. She reports that she is able to control her talking. He reports that her awareness is often limited or focused on one aspect and not others. She reports that her appetite has been ok overall and is decreased today.   She reports chronic suicidal thoughts and denies suicidal intent or plan.   Reports side effects with Carbamazepine (dizziness, nausea, visual disturbance) improved a few days after dose reduction. She reports "my thoughts are  never slow."   Husband reports, "I think she has been manic for 3 weeks, easy."   They had guests a few times over the last week.   They report that they did not go up to Olanzapine 15 mg po QHS. She reports that TD has been "better" recently.  Past Psychiatric Medication Trials: Olanzapine Seroquel Saphris Depakote-self-injurious behavior Lamictal Keppra Carbamazepine Xanax Klonopin Ambien Trazodone Benztropine Ingrezza Propranolol- Ineffective  AIMS    Flowsheet Row Office Visit from 03/09/2021 in [facility] Office Visit from 09/30/2020 in [place] [facility] Office Visit from 06/16/2020 in [facility] Visit from 04/03/2020 in [facility] Visit from 03/06/2020 in [facility] Total Score '10 9 8 14 19      '$ [place] Office Visit from 02/28/2017 in [facility] at [place]  Total Score (max 30 points ) 29      PHQ2-9    [place] Visit from 02/28/2017 in [place] [facility] [place] Visit from 09/19/2015 in [facility] at [place] from 06/05/2015 in [place] [facility] [place] from 03/10/2015 in [facility] at [facility] Total Score 0 0 0 0        Review of Systems:  Review of Systems  Gastrointestinal:  Negative for nausea.  Musculoskeletal:  Negative for gait problem.  Neurological:  Negative for dizziness.  Psychiatric/Behavioral:  The patient has insomnia.        Please  refer to HPI   Medications: I have reviewed the patient's current medications.  Current Outpatient Medications  Medication Sig Dispense Refill   ALPRAZolam (XANAX XR) 2 MG 24 hr tablet TAKE 1 TABLET BY MOUTH IN  THE MORNING AND AT BEDTIME 180 tablet 1   carbamazepine (CARBATROL) 300 MG 12 hr capsule Take 1 capsule (300 mg total) by mouth every morning AND 2 capsules (600 mg total) at  bedtime. 360 capsule 0   cetirizine (ZYRTEC) 10 MG tablet Take 10 mg by mouth daily as needed.      Deutetrabenazine (AUSTEDO) 12 MG TABS Take 1 tablet po BID with a 9 mg tablet to equal 21 mg po BID 60 tablet 5   Deutetrabenazine (AUSTEDO) 9 MG TABS Take 1 tab po BID with a 12 mg tab to equal 21 mg BID 60 tablet 5   fluticasone (FLONASE) 50 MCG/ACT nasal spray Place into both nostrils daily as needed.      levETIRAcetam (KEPPRA) 500 MG tablet Take 1 tablet (500 mg total) by mouth 2 (two) times daily. 180 tablet 3   lovastatin (MEVACOR) 20 MG tablet Take 1 tablet (20 mg total) by mouth daily. 90 tablet 3   Melatonin 10 MG TABS Take 1 tablet by mouth at bedtime. Reported on 01/30/2016     Multiple Vitamins-Minerals (CENTRUM SILVER PO) Take by mouth.     pantoprazole (PROTONIX) 40 MG tablet Take 1 tablet (40 mg total) by mouth daily. Take for 4- 12 weeks as needed 90 tablet 3   polyethylene glycol (MIRALAX / GLYCOLAX) 17 g packet Take 17 g by mouth daily as needed.     zolpidem (AMBIEN) 10 MG tablet Take 1 tablet (10 mg total) by mouth at bedtime as needed. 30 tablet 4   ALPRAZolam (XANAX) 1 MG tablet TAKE 1 TABLET BY MOUTH  DAILY AS NEEDED FOR SEVERE  ANXIETY 15 tablet 2   clonazePAM (KLONOPIN) 1 MG tablet Take 1 tablet (1 mg total) by mouth at bedtime. 30 tablet 1   COVID-19 mRNA vaccine, Moderna, (MODERNA COVID-19 VACCINE) 100 MCG/0.5ML injection Inject into the muscle. 0.25 mL 0   OLANZapine (ZYPREXA) 15 MG tablet Take 1 tablet (15 mg total) by mouth at bedtime. (Patient taking differently: Take 10 mg by mouth at bedtime.) 30 tablet 0   traZODone (DESYREL) 100 MG tablet Take 1-2 tablets (100-200 mg total) by mouth at bedtime as needed. for sleep 180 tablet 3   No current facility-administered medications for this visit.    Medication Side Effects: Other: TD  Allergies:  Allergies  Allergen Reactions   Lamictal [Lamotrigine]     Acute renal failure   Ropinirole Nausea And Vomiting    Tramadol    Codeine Swelling and Rash    Can take hydrocodone   Penicillins     Abdominal pain    Past Medical History:  Diagnosis Date   Adenomatous colon polyp    Allergy    Anxiety    Bipolar 1 disorder ([place])    Blood transfusion without reported diagnosis    Depression    Elevated LFTs    Hepatic steatosis 05/07/13   Hyperlipidemia    Seizures (HCC)    Tardive dyskinesia     Past Medical History, Surgical history, Social history, and Family history were reviewed and updated as appropriate.   Please see review of systems for further details on the patient's review from today.   Objective:   Physical Exam:  There were no vitals  taken for this visit.  Physical Exam  Lab Review:     Component Value Date/Time   NA 135 12/04/2020 1107   K 4.8 12/04/2020 1107   CL 97 12/04/2020 1107   CO2 30 12/04/2020 1107   GLUCOSE 84 12/04/2020 1107   BUN 10 12/04/2020 1107   CREATININE 0.82 12/04/2020 1107   CREATININE 0.79 05/08/2020 1015   CALCIUM 9.1 12/04/2020 1107   PROT 6.8 12/04/2020 1107   ALBUMIN 4.3 12/04/2020 1107   AST 29 12/04/2020 1107   ALT 38 (H) 12/04/2020 1107   ALKPHOS 109 12/04/2020 1107   BILITOT 0.4 12/04/2020 1107       Component Value Date/Time   WBC 4.8 05/08/2020 1015   RBC 4.94 05/08/2020 1015   HGB 14.7 05/08/2020 1015   HCT 42.8 05/08/2020 1015   PLT 188 05/08/2020 1015   MCV 86.6 05/08/2020 1015   MCV 87.7 02/07/2013 1110   MCH 29.8 05/08/2020 1015   MCHC 34.3 05/08/2020 1015   RDW 12.8 05/08/2020 1015    No results found for: POCLITH, LITHIUM   Lab Results  Component Value Date   CBMZ 5.2 02/01/2019     .res Assessment: Plan:   Patient seen for 30 minutes and time spent discussing recent manic signs and symptoms and sleep disruption.  Discussed possible treatment options to improve manic signs and symptoms.  Recommend increasing olanzapine to 15 mg at bedtime for acute stabilization of mania and insomnia.  Discussed that higher  dose of olanzapine may need to be used short-term family to minimize risk of exacerbating tardive dyskinesia. Will change Klonopin 0.5 mg 2 tablets at bedtime to one Klonopin 1 mg tablet at bedtime for insomnia and anxiety. Will continue all other medications as prescribed. Discussed patient and her husband plans to have follow-up neuropsychiatric testing.  They report that Dr. Vikki Ports performed neuropsych logical testing in 2017 and that she has not had a a review since that time.  Patient's husband reports that he continues to ask the similar cognitive issues too though she presented with an 2017.  We will request reevaluation. Recommend continuing psychotherapy with Rinaldo Cloud, LCSW. Patient to follow-up with this provider in 10 to 14 days or sooner if clinically Patient advised to contact office with any questions, adverse effects, or acute worsening in signs and symptoms.   Ninette was seen today for manic behavior and insomnia.  Diagnoses and all orders for this visit:  Primary insomnia -     clonazePAM (KLONOPIN) 1 MG tablet; Take 1 tablet (1 mg total) by mouth at bedtime. -     traZODone (DESYREL) 100 MG tablet; Take 1-2 tablets (100-200 mg total) by mouth at bedtime as needed. for sleep  Posttraumatic stress disorder -     clonazePAM (KLONOPIN) 1 MG tablet; Take 1 tablet (1 mg total) by mouth at bedtime. -     ALPRAZolam (XANAX) 1 MG tablet; TAKE 1 TABLET BY MOUTH  DAILY AS NEEDED FOR SEVERE  ANXIETY    Please see After Visit Summary for patient specific instructions.  Future Appointments  Date Time Provider [place]  05/14/2021  9:40 AM Copland, Gay Filler, MD [facility]  05/22/2021 11:30 AM [name], PMHNP [facility] None  06/01/2021 12:00 PM Shanon Ace, LCSW [facility] None  06/22/2021 12:00 PM Shanon Ace, LCSW [facility] None  07/13/2021 11:00 AM Shanon Ace, LCSW [facility] None  09/29/2021  3:30 PM Cameron Sprang, MD [facility] None    No orders of the  defined types  were placed in this encounter.   -------------------------------

## 2021-05-12 NOTE — Patient Instructions (Addendum)
It was great to see you again today, I will be in touch with your labs are soon as possible Please consider getting the updated COVID-vaccine at your convenience Flu shot today  Let me know if I can do anything to assist you, I hope that you continue to feel better!

## 2021-05-12 NOTE — Progress Notes (Addendum)
[facility] [place], [place], [place] [address] [phone]  Date:  05/14/2021   Name:  Theresa [name]   DOB:  1955-09-24   MRN:  [id number]  PCP:  Darreld Mclean, MD    Chief Complaint: Annual Exam (Concerns/ questions: none/Flu shot today: yes/HIV screen due)   History of Present Illness:  Theresa [name] is a 65 y.o. very pleasant female patient who presents with the following:  Patient seen today for physical exam Most recent visit with myself was in April History of bipolar disorder and epilepsy, tardive dyskinesia, dyslipidemia Last seen by myself in September for a physical Her bipolar disorder is managed by [name], NP with behavioral health-she was seen just last month They have made some changes in her medication and we do think it is working  Her husband notes she has been manic for about 2 weeks, not sleeping well.  Her husband is watching her closely, he does feel that she is improving and slept more the last 2 nights  She saw Dr. Delice Lesch, her neurologist in February  Most recent seizure just over a year ago Pt notes that she has to urinate more frequently in the evening for a year or so.  No leakage  No pain with urination, no foul odor   Flu vaccine- give today   COVID-19 booster Pap smear, Colonoscopy, mammogram up-to-date Bone density performed in May showed normal density I am prescribing lovastatin, Protonix  Most recent lab work in April, [place] and vitamin D  Patient Active Problem List   Diagnosis Date Noted   Dyslipidemia 05/05/2020   Manic bipolar I disorder in partial remission ([place]) 07/25/2018   Posttraumatic stress disorder 07/25/2018   Generalized idiopathic epilepsy and epileptic syndromes, not intractable, without status epilepticus ([place]) 06/24/2017   Tardive dyskinesia 01/21/2017   Special screening for malignant neoplasms, colon 06/16/2012   Personal history of  colonic polyps 06/16/2012   Bipolar disorder ([place]) 04/02/2012   Idiopathic generalized epilepsy ([place]) 10/15/2011    Past Medical History:  Diagnosis Date   Adenomatous colon polyp    Allergy    Anxiety    Bipolar 1 disorder ([place])    Blood transfusion without reported diagnosis    Depression    Elevated LFTs    Hepatic steatosis 05/07/13   Hyperlipidemia    Seizures ([place])    Tardive dyskinesia     Past Surgical History:  Procedure Laterality Date   APPENDECTOMY     COLONOSCOPY  06/16/2012   Procedure: COLONOSCOPY;  Surgeon: Lafayette Dragon, MD;  Location: [facility];  Service: Endoscopy;  Laterality: N/A;   DILATION AND CURETTAGE OF UTERUS     KNEE ARTHROSCOPY WITH MENISCAL REPAIR Left    NECK SURGERY      Social History   Tobacco Use   Smoking status: Never   Smokeless tobacco: Never  Vaping Use   Vaping Use: Never used  Substance Use Topics   Alcohol use: Yes    Comment: occasionaly 1 a month   Drug use: No    Family History  Problem Relation Age of Onset   Colon polyps Mother    Cancer Mother    Heart disease Mother    Alcoholism Mother    Cancer Father    Alcoholism Father    Bipolar disorder Father    Drug abuse Brother    Bipolar disorder Other    Colon  cancer Neg Hx    Stomach cancer Neg Hx    Breast cancer Neg Hx     Allergies  Allergen Reactions   Lamictal [Lamotrigine]     Acute renal failure   Ropinirole Nausea And Vomiting   Tramadol    Codeine Swelling and Rash    Can take hydrocodone   Penicillins     Abdominal pain    Medication list has been reviewed and updated.  Current Outpatient Medications on File Prior to Visit  Medication Sig Dispense Refill   ALPRAZolam (XANAX XR) 2 MG 24 hr tablet TAKE 1 TABLET BY MOUTH IN  THE MORNING AND AT BEDTIME 180 tablet 1   ALPRAZolam (XANAX) 1 MG tablet TAKE 1 TABLET BY MOUTH  DAILY AS NEEDED FOR SEVERE  ANXIETY 15 tablet 2   carbamazepine (CARBATROL) 300 MG 12 hr capsule Take 1 capsule (300  mg total) by mouth every morning AND 2 capsules (600 mg total) at bedtime. 360 capsule 0   cetirizine (ZYRTEC) 10 MG tablet Take 10 mg by mouth daily as needed.      clonazePAM (KLONOPIN) 1 MG tablet Take 1 tablet (1 mg total) by mouth at bedtime. 30 tablet 1   COVID-19 mRNA vaccine, Moderna, (MODERNA COVID-19 VACCINE) 100 MCG/0.5ML injection Inject into the muscle. 0.25 mL 0   Deutetrabenazine (AUSTEDO) 12 MG TABS Take 1 tablet po BID with a 9 mg tablet to equal 21 mg po BID 60 tablet 5   Deutetrabenazine (AUSTEDO) 9 MG TABS Take 1 tab po BID with a 12 mg tab to equal 21 mg BID 60 tablet 5   fluticasone (FLONASE) 50 MCG/ACT nasal spray Place into both nostrils daily as needed.      levETIRAcetam (KEPPRA) 500 MG tablet Take 1 tablet (500 mg total) by mouth 2 (two) times daily. 180 tablet 3   lovastatin (MEVACOR) 20 MG tablet Take 1 tablet (20 mg total) by mouth daily. 90 tablet 3   Melatonin 10 MG TABS Take 1 tablet by mouth at bedtime. Reported on 01/30/2016     Multiple Vitamins-Minerals (CENTRUM SILVER PO) Take by mouth.     OLANZapine (ZYPREXA) 15 MG tablet Take 1 tablet (15 mg total) by mouth at bedtime. (Patient taking differently: Take 10 mg by mouth at bedtime.) 30 tablet 0   pantoprazole (PROTONIX) 40 MG tablet Take 1 tablet (40 mg total) by mouth daily. Take for 4- 12 weeks as needed 90 tablet 3   polyethylene glycol (MIRALAX / GLYCOLAX) 17 g packet Take 17 g by mouth daily as needed.     traZODone (DESYREL) 100 MG tablet Take 1-2 tablets (100-200 mg total) by mouth at bedtime as needed. for sleep 180 tablet 3   zolpidem (AMBIEN) 10 MG tablet Take 1 tablet (10 mg total) by mouth at bedtime as needed. 30 tablet 4   No current facility-administered medications on file prior to visit.    Review of Systems:  As per HPI- otherwise negative.   Physical Examination: Vitals:   05/14/21 0935  BP: 124/80  Pulse: 70  Resp: 18  Temp: 97.9 F (36.6 C)  SpO2: 98%   Vitals:   05/14/21  0935  Weight: 177 lb 9.6 oz (80.6 kg)  Height: 5\' 6"  (1.676 m)   Body mass index is 28.67 kg/m. Ideal Body Weight: Weight in (lb) to have BMI = 25: 154.6  GEN: no acute distress.   Mild overweight, looks well  HEENT: Atraumatic, Normocephalic.  Ears and Nose:  No external deformity. CV: RRR, No M/G/R. No JVD. No thrill. No extra heart sounds. PULM: CTA B, no wheezes, crackles, rhonchi. No retractions. No resp. distress. No accessory muscle use. ABD: S, NT, ND, +BS. No rebound. No HSM. EXTR: No c/c/e PSYCH: Normally interactive. Conversant.    Assessment and Plan: Physical exam  Screening for deficiency anemia - Plan: CBC  Screening for thyroid disorder - Plan: TSH  Screening for diabetes mellitus - Plan: Hemoglobin A1c  Dyslipidemia - Plan: Lipid panel  Need for influenza vaccination - Plan: Flu Vaccine QUAD High Dose(Fluad) CPE today- Will plan further follow- up pending labs. Offered support as they manage her mental illness Went over health maint and vaccine recommendations   This visit occurred during the SARS-CoV-2 public health emergency.  Safety protocols were in place, including screening questions prior to the visit, additional usage of staff PPE, and extensive cleaning of exam room while observing appropriate contact time as indicated for disinfecting solutions.   Signed Lamar Blinks, MD  Received her labs as below, message to patient  Results for orders placed or performed in visit on 05/14/21  CBC  Result Value Ref Range   WBC 3.9 (L) 4.0 - 10.5 K/uL   RBC 4.75 3.87 - 5.11 Mil/uL   Platelets 200.0 150.0 - 400.0 K/uL   Hemoglobin 13.7 12.0 - 15.0 g/dL   HCT 41.2 36.0 - 46.0 %   MCV 86.9 78.0 - 100.0 fl   MCHC 33.2 30.0 - 36.0 g/dL   RDW 13.5 11.5 - 15.5 %  Hemoglobin A1c  Result Value Ref Range   Hgb A1c MFr Bld 5.8 4.6 - 6.5 %  Lipid panel  Result Value Ref Range   Cholesterol 217 (H) 0 - 200 mg/dL   Triglycerides 230.0 (H) 0.0 - 149.0 mg/dL    HDL 70.60 >39.00 mg/dL   VLDL 46.0 (H) 0.0 - 40.0 mg/dL   Total CHOL/HDL Ratio 3    NonHDL 146.62   TSH  Result Value Ref Range   TSH 2.92 0.35 - 5.50 uIU/mL  LDL cholesterol, direct  Result Value Ref Range   Direct LDL 114.0 mg/dL   The 10-year ASCVD risk score (Arnett DK, et al., 2019) is: 4.9%   Values used to calculate the score:     Age: 21 years     Sex: Female     Is Non-Hispanic African American: No     Diabetic: No     Tobacco smoker: No     Systolic Blood Pressure: 794 mmHg     Is BP treated: No     HDL Cholesterol: 70.6 mg/dL     Total Cholesterol: 217 mg/dL

## 2021-05-13 ENCOUNTER — Other Ambulatory Visit: Payer: Self-pay | Admitting: Psychiatry

## 2021-05-13 DIAGNOSIS — F4310 Post-traumatic stress disorder, unspecified: Secondary | ICD-10-CM

## 2021-05-14 ENCOUNTER — Other Ambulatory Visit: Payer: Self-pay

## 2021-05-14 ENCOUNTER — Encounter: Payer: Self-pay | Admitting: Family Medicine

## 2021-05-14 ENCOUNTER — Ambulatory Visit (INDEPENDENT_AMBULATORY_CARE_PROVIDER_SITE_OTHER): Payer: Medicare Other | Admitting: Family Medicine

## 2021-05-14 ENCOUNTER — Ambulatory Visit: Payer: Medicare Other | Attending: Internal Medicine

## 2021-05-14 VITALS — BP 124/80 | HR 70 | Temp 97.9°F | Resp 18 | Ht 66.0 in | Wt 177.6 lb

## 2021-05-14 DIAGNOSIS — Z131 Encounter for screening for diabetes mellitus: Secondary | ICD-10-CM | POA: Diagnosis not present

## 2021-05-14 DIAGNOSIS — E785 Hyperlipidemia, unspecified: Secondary | ICD-10-CM

## 2021-05-14 DIAGNOSIS — Z130 Encounter for screening for diseases of the blood and blood-forming organs and certain disorders involving the immune mechanism: Secondary | ICD-10-CM

## 2021-05-14 DIAGNOSIS — Z1329 Encounter for screening for other suspected endocrine disorder: Secondary | ICD-10-CM

## 2021-05-14 DIAGNOSIS — Z0000 Encounter for general adult medical examination without abnormal findings: Secondary | ICD-10-CM

## 2021-05-14 DIAGNOSIS — Z23 Encounter for immunization: Secondary | ICD-10-CM | POA: Diagnosis not present

## 2021-05-14 LAB — HEMOGLOBIN A1C: Hgb A1c MFr Bld: 5.8 % (ref 4.6–6.5)

## 2021-05-14 LAB — LIPID PANEL
Cholesterol: 217 mg/dL — ABNORMAL HIGH (ref 0–200)
HDL: 70.6 mg/dL (ref >39.00)
NonHDL: 146.62
Total CHOL/HDL Ratio: 3
Triglycerides: 230 mg/dL — ABNORMAL HIGH (ref 0.0–149.0)
VLDL: 46 mg/dL — ABNORMAL HIGH (ref 0.0–40.0)

## 2021-05-14 LAB — TSH: TSH: 2.92 u[IU]/mL (ref 0.35–5.50)

## 2021-05-14 LAB — CBC
HCT: 41.2 % (ref 36.0–46.0)
Hemoglobin: 13.7 g/dL (ref 12.0–15.0)
MCHC: 33.2 g/dL (ref 30.0–36.0)
MCV: 86.9 fl (ref 78.0–100.0)
Platelets: 200 10*3/uL (ref 150.0–400.0)
RBC: 4.75 Mil/uL (ref 3.87–5.11)
RDW: 13.5 % (ref 11.5–15.5)
WBC: 3.9 10*3/uL — ABNORMAL LOW (ref 4.0–10.5)

## 2021-05-14 LAB — LDL CHOLESTEROL, DIRECT: Direct LDL: 114 mg/dL

## 2021-05-14 NOTE — Progress Notes (Signed)
   Covid-19 Vaccination Clinic  Name:  Theresa [name]    MRN: [id number] DOB: [birth year]  05/14/2021  Ms. App was observed post Covid-19 immunization for 15 minutes without incident. She was provided with Vaccine Information Sheet and instruction to access the V-Safe system.   [name] was instructed to call 911 with any severe reactions post vaccine: Difficulty breathing  Swelling of face and throat  A fast heartbeat  A bad rash all over body  Dizziness and weakness

## 2021-05-15 ENCOUNTER — Telehealth: Payer: Self-pay | Admitting: Psychiatry

## 2021-05-15 NOTE — Telephone Encounter (Signed)
Next visit is 05/22/21. Theresa [name] called just to say that she is doing better now and has had a few good nights of sleep. No need to call her back.

## 2021-05-15 NOTE — Telephone Encounter (Signed)
FYI

## 2021-05-19 DIAGNOSIS — H31093 Other chorioretinal scars, bilateral: Secondary | ICD-10-CM | POA: Diagnosis not present

## 2021-05-19 DIAGNOSIS — H02831 Dermatochalasis of right upper eyelid: Secondary | ICD-10-CM | POA: Diagnosis not present

## 2021-05-19 DIAGNOSIS — H02834 Dermatochalasis of left upper eyelid: Secondary | ICD-10-CM | POA: Diagnosis not present

## 2021-05-19 DIAGNOSIS — H40033 Anatomical narrow angle, bilateral: Secondary | ICD-10-CM | POA: Diagnosis not present

## 2021-05-19 DIAGNOSIS — H25813 Combined forms of age-related cataract, bilateral: Secondary | ICD-10-CM | POA: Diagnosis not present

## 2021-05-20 ENCOUNTER — Other Ambulatory Visit (HOSPITAL_BASED_OUTPATIENT_CLINIC_OR_DEPARTMENT_OTHER): Payer: Self-pay

## 2021-05-20 MED ORDER — COVID-19MRNA BIVAL VACC PFIZER 30 MCG/0.3ML IM SUSP
INTRAMUSCULAR | 0 refills | Status: DC
  Filled 2021-05-20: qty 0.3, 1d supply, fill #0

## 2021-05-22 ENCOUNTER — Ambulatory Visit: Payer: Medicare Other | Admitting: Psychiatry

## 2021-05-22 ENCOUNTER — Encounter: Payer: Self-pay | Admitting: Psychiatry

## 2021-05-22 DIAGNOSIS — F319 Bipolar disorder, unspecified: Secondary | ICD-10-CM

## 2021-05-22 DIAGNOSIS — F5101 Primary insomnia: Secondary | ICD-10-CM

## 2021-05-22 DIAGNOSIS — F4310 Post-traumatic stress disorder, unspecified: Secondary | ICD-10-CM

## 2021-05-22 DIAGNOSIS — G2401 Drug induced subacute dyskinesia: Secondary | ICD-10-CM | POA: Diagnosis not present

## 2021-05-22 MED ORDER — OLANZAPINE 10 MG PO TABS: 10.0000 mg | ORAL_TABLET | Freq: Every day | ORAL | 0 refills | Status: DC

## 2021-05-22 NOTE — Progress Notes (Signed)
Theresa [name] [id number] May 12, 1956 65 y.o.  Virtual Visit via Telephone Note  I connected with pt on 05/22/21 at 11:30 AM EDT by telephone and verified that I am speaking with the correct person using two identifiers.   I discussed the limitations, risks, security and privacy concerns of performing an evaluation and management service by telephone and the availability of in person appointments. I also discussed with the patient that there may be a patient responsible charge related to this service. The patient expressed understanding and agreed to proceed.   I discussed the assessment and treatment plan with the patient. The patient was provided an opportunity to ask questions and all were answered. The patient agreed with the plan and demonstrated an understanding of the instructions.   The patient was advised to call back or seek an in-person evaluation if the symptoms worsen or if the condition fails to improve as anticipated.  I provided 30 minutes of non-face-to-face time during this encounter.  The patient was located at home.  The provider was located at [place].   [name], PMHNP   Subjective:   Patient ID:  Theresa [name] is a 65 y.o. (DOB January 24, 1956) female.  Chief Complaint:  Chief Complaint  Patient presents with   Manic Behavior   Anxiety   Sleeping Problem    HPI Theresa [name] presents for follow-up of mood disturbance, anxiety, and insomnia. Husband also participates with call. She reports, "I'm doing better." She reports that she has had "good sleep" for the last 4 nights. She reports that she has noticed an increase in her TD s/s and now experiencing TD in her hands. She reports that she is noticing some tremor. Husband reports that pt is "acting more together... over a hill." He reports that she seems to be getting more qualitative sleep and feeling more rested/refreshed upon awakening. Husband reports that her manic s/s seem to have  stabilized. He reports that manic s/s have manifested more as racing thoughts and increased ideas instead of elevated energy. He reports that triggering events seem to be if there are different events occurring, time of year, etc.   She reports that in recent weeks she was having recent mood cycling and denies depressed mood. She reports, "I have anxiety all the time." She has been making a list of different things that trigger her anxiety. Denies any recent panic attacks. She reports that she gets "over-loaded" in the grocery store. She reports over-stimulated easily. Takes Xanax IR if she is going to be in a crowd or something that causes anxiety.   They have noticed she has been more sluggish after taking morning medications. She reports that she is now eating breakfast and then taking her medication around 9 pm instead of taking medication first thing at 6 am. She reports that she falls asleep easily in the morning "without trying." She reports "every day is different" and some days she will sleep for an hour and some days it is longer. Has been trying to take the dogs for a walk in the morning and again later in the day. She reports that she has been trying to teach herself to play harmonica and would like to clean out her art studio. She reports that she "always" has racing thoughts "but it seems to be slowing down." Denies SI.   She reports that she has long-standing pattern of sleeping with TV on.   Leaves for trip 12/17-12/31.  Past Psychiatric Medication Trials: Olanzapine Seroquel  Saphris Depakote-self-injurious behavior Lamictal Keppra Carbamazepine Xanax Klonopin Ambien Trazodone Benztropine Ingrezza Propranolol- Ineffective  Review of Systems:  Review of Systems  Musculoskeletal:  Negative for gait problem.  Neurological:  Positive for tremors.  Psychiatric/Behavioral:         Please refer to HPI   Medications: I have reviewed the patient's current  medications.  Current Outpatient Medications  Medication Sig Dispense Refill   ALPRAZolam (XANAX XR) 2 MG 24 hr tablet TAKE 1 TABLET BY MOUTH IN  THE MORNING AND AT BEDTIME 180 tablet 1   carbamazepine (CARBATROL) 300 MG 12 hr capsule Take 1 capsule (300 mg total) by mouth every morning AND 2 capsules (600 mg total) at bedtime. 360 capsule 0   Deutetrabenazine (AUSTEDO) 12 MG TABS Take 1 tablet po BID with a 9 mg tablet to equal 21 mg po BID 60 tablet 5   Deutetrabenazine (AUSTEDO) 9 MG TABS Take 1 tab po BID with a 12 mg tab to equal 21 mg BID 60 tablet 5   levETIRAcetam (KEPPRA) 500 MG tablet Take 1 tablet (500 mg total) by mouth 2 (two) times daily. 180 tablet 3   lovastatin (MEVACOR) 20 MG tablet Take 1 tablet (20 mg total) by mouth daily. 90 tablet 3   pantoprazole (PROTONIX) 40 MG tablet Take 1 tablet (40 mg total) by mouth daily. Take for 4- 12 weeks as needed 90 tablet 3   ALPRAZolam (XANAX) 1 MG tablet TAKE 1 TABLET BY MOUTH  DAILY AS NEEDED FOR SEVERE  ANXIETY 15 tablet 2   cetirizine (ZYRTEC) 10 MG tablet Take 10 mg by mouth daily as needed.      clonazePAM (KLONOPIN) 1 MG tablet Take 1 tablet (1 mg total) by mouth at bedtime. 30 tablet 1   COVID-19 mRNA bivalent vaccine, Pfizer, injection Inject into the muscle. 0.3 mL 0   COVID-19 mRNA vaccine, Moderna, (MODERNA COVID-19 VACCINE) 100 MCG/0.5ML injection Inject into the muscle. 0.25 mL 0   fluticasone (FLONASE) 50 MCG/ACT nasal spray Place into both nostrils daily as needed.      Melatonin 10 MG TABS Take 1 tablet by mouth at bedtime. Reported on 01/30/2016     Multiple Vitamins-Minerals (CENTRUM SILVER PO) Take by mouth.     OLANZapine (ZYPREXA) 10 MG tablet Take 1 tablet (10 mg total) by mouth at bedtime. 30 tablet 0   polyethylene glycol (MIRALAX / GLYCOLAX) 17 g packet Take 17 g by mouth daily as needed.     traZODone (DESYREL) 100 MG tablet Take 1-2 tablets (100-200 mg total) by mouth at bedtime as needed. for sleep 180 tablet 3    zolpidem (AMBIEN) 10 MG tablet Take 1 tablet (10 mg total) by mouth at bedtime as needed. 30 tablet 4   No current facility-administered medications for this visit.    Medication Side Effects: Fatigue and Other: Wt gain and TD  Allergies:  Allergies  Allergen Reactions   Lamictal [Lamotrigine]     Acute renal failure   Ropinirole Nausea And Vomiting   Tramadol    Codeine Swelling and Rash    Can take hydrocodone   Penicillins     Abdominal pain    Past Medical History:  Diagnosis Date   Adenomatous colon polyp    Allergy    Anxiety    Bipolar 1 disorder ([place])    Blood transfusion without reported diagnosis    Depression    Elevated LFTs    Hepatic steatosis 05/07/13   Hyperlipidemia  Seizures ([place])    Tardive dyskinesia     Family History  Problem Relation Age of Onset   Colon polyps Mother    Cancer Mother    Heart disease Mother    Alcoholism Mother    Cancer Father    Alcoholism Father    Bipolar disorder Father    Drug abuse Brother    Bipolar disorder Other    Colon cancer Neg Hx    Stomach cancer Neg Hx    Breast cancer Neg Hx     Social History   Socioeconomic History   Marital status: Married    Spouse name: Not on file   Number of children: Not on file   Years of education: Not on file   Highest education level: Not on file  Occupational History   Not on file  Tobacco Use   Smoking status: Never   Smokeless tobacco: Never  Vaping Use   Vaping Use: Never used  Substance and Sexual Activity   Alcohol use: Yes    Comment: occasionaly 1 a month   Drug use: No   Sexual activity: Not Currently  Other Topics Concern   Not on file  Social History Narrative   Lives in 2 story home with her husband   Has 2 adult children   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   Please see review of systems for further details on the patient's review from today.   Objective:   Physical Exam:  There were no vitals taken for this visit.  Physical Exam Neurological:     Mental Status: She is alert and oriented to person, place, and time.     Cranial Nerves: No dysarthria.  Psychiatric:        Attention and Perception: Attention and perception normal.        Mood and Affect: Mood is anxious.        Speech: Speech normal.        Behavior: Behavior is cooperative.        Thought Content: Thought content normal. Thought content is not paranoid or delusional. Thought content does not include homicidal or suicidal ideation. Thought content does not include homicidal or suicidal plan.        Cognition and Memory: Cognition and memory normal.        Judgment: Judgment normal.     Comments: Insight intact Mood presents as more stable. Mildly anxious.    Lab Review:     Component Value Date/Time   NA 135 12/04/2020 1107   K 4.8 12/04/2020 1107   CL 97 12/04/2020 1107   CO2 30 12/04/2020 1107   GLUCOSE 84 12/04/2020 1107   BUN 10 12/04/2020 1107   CREATININE 0.82 12/04/2020 1107   CREATININE 0.79 05/08/2020 1015   CALCIUM 9.1 12/04/2020 1107   PROT 6.8 12/04/2020 1107   ALBUMIN 4.3 12/04/2020 1107   AST 29 12/04/2020 1107   ALT 38 (H) 12/04/2020 1107   ALKPHOS 109 12/04/2020 1107   BILITOT 0.4 12/04/2020 1107  Component Value Date/Time   WBC 3.9 (L) 05/14/2021 0958   RBC 4.75 05/14/2021 0958   HGB 13.7 05/14/2021 0958   HCT 41.2 05/14/2021 0958   PLT 200.0 05/14/2021 0958   MCV 86.9 05/14/2021 0958   MCV 87.7 02/07/2013 1110   MCH 29.8 05/08/2020 1015   MCHC 33.2 05/14/2021 0958   RDW 13.5 05/14/2021 0958     No results found for: POCLITH, LITHIUM   Lab Results  Component Value Date   CBMZ 5.2 02/01/2019     .res Assessment: Plan:   Pt seen for 30 minutes and time spent discussing response to increase in Olanzapine to 15 mg po QHS to stabilize acute mania. Pt and her husband agree that acute mania has resolved and mood is more stable. They report that she has experienced worsening TD with Olanzapine increase.  Will decrease Olanzapine to 10 mg po QHS to minimize TD and excessive somnolence since manic s/s have improved.  Also discussed her concerns about somnolence after taking morning medications. Discussed considering reduction in morning dose of Xanax XR in the future if somnolence persists.  Discussed sleep and sleep hygiene and that therapist may be able to help with sleep hygiene. Continue Xanax XR 2 mg po BID for anxiety.  Continue Xanax 1 mg po qd prn severe anxiety.  Continue Carbatrol 300 mg po q am and 600 mg po QHS for mood stabilization.  Continue Klonopin 1 mg at bedtime to improve anxiety and insomnia. Continue Austedo 21 mg po BID for TD.  Continue Trazodone 100 mg 1-2 tabs po QHS for insomnia.  Continue Ambien 10 mg po QHS prn insomnia. Pt to follow-up in 4 weeks or sooner if clinically indicated.  Recommend continuing therapy with Rinaldo Cloud, LCSW. Patient advised to contact office with any questions, adverse effects, or acute worsening in signs and symptoms.   Theresa [name] was seen today for manic behavior, anxiety and sleeping problem.  Diagnoses and all orders for this visit:  Bipolar 1 disorder ([place]) -     OLANZapine (ZYPREXA) 10 MG tablet; Take 1 tablet (10 mg total) by mouth at bedtime.  Posttraumatic stress disorder  Primary insomnia  Tardive dyskinesia   Please see After Visit Summary for patient specific instructions.  Future Appointments  Date Time Provider [place]  06/01/2021 12:00 PM Shanon Ace, LCSW [facility] None  06/22/2021 12:00 PM  Shanon Ace, LCSW [facility] None  07/13/2021 11:00 AM Shanon Ace, LCSW [facility] None  09/29/2021  3:30 PM Cameron Sprang, MD [facility] None    No orders of the defined types were placed in this encounter.     -------------------------------

## 2021-05-25 ENCOUNTER — Other Ambulatory Visit: Payer: Self-pay | Admitting: Psychiatry

## 2021-05-25 DIAGNOSIS — F4310 Post-traumatic stress disorder, unspecified: Secondary | ICD-10-CM

## 2021-05-25 DIAGNOSIS — G40309 Generalized idiopathic epilepsy and epileptic syndromes, not intractable, without status epilepticus: Secondary | ICD-10-CM

## 2021-05-26 NOTE — Telephone Encounter (Signed)
Last filled 8/29

## 2021-05-31 ENCOUNTER — Other Ambulatory Visit: Payer: Self-pay | Admitting: Psychiatry

## 2021-05-31 DIAGNOSIS — F319 Bipolar disorder, unspecified: Secondary | ICD-10-CM

## 2021-06-01 ENCOUNTER — Ambulatory Visit: Payer: Medicare Other | Admitting: Psychology

## 2021-06-01 ENCOUNTER — Ambulatory Visit: Payer: Medicare Other | Admitting: Psychiatry

## 2021-06-01 ENCOUNTER — Other Ambulatory Visit: Payer: Self-pay

## 2021-06-01 DIAGNOSIS — F319 Bipolar disorder, unspecified: Secondary | ICD-10-CM

## 2021-06-01 NOTE — Progress Notes (Signed)
[facility] Counselor/Therapist Progress Note  Patient ID: Theresa [name], MRN: [id number],    Date: 06/01/2021  Time Spent: 50 minutes   Treatment Type: Individual Therapy  Reported Symptoms: anxiety "related to bipolar", feeling drained and not motivated  Mental Status Exam:  Appearance:   Casual     Behavior:  Appropriate and Sharing  Motor:  Normal  Speech/Language:   Clear and Coherent  Affect:  Anxious "but not as up and down"  Mood:  anxious  Thought process:  goal directed  Thought content:    overthink  Sensory/Perceptual disturbances:    WNL  Orientation:  oriented to person, place, time/date, situation, day of week, month of year, year, and stated date of Oct. 10, 2022  Attention:  Fair  Concentration:  Fair  Memory:  Reports "mostly short term memory concerns "; has testing scheduled in Jan. 2023  Fund of knowledge:   Good  Insight:    Good and Fair  Judgment:   Good  Impulse Control:  Fair   Risk Assessment: Danger to Self:  No Self-injurious Behavior: No Danger to Others: No Duty to Warn:no Physical Aggression / Violence:No  Access to Firearms a concern: No  Gang Involvement:No   Subjective: Patient today reporting that she recently had "bipolar issues" and medicine change has helped and "is sleeping better, is feeling calmer and not cycling as much."  Feels "for today the mania is subdued." Sharing today that anxiety is heightened by: Social events, loud crowds and situations Initial parts of therapy sessions (states this has always been true) Meeting with people can cause me anxiety My husband raising his voice or yelling at me. Get overstimulated by things like going in grocery store. Some memories, painful from the past. Mania makes me anxious. Sometime nothing obvious.Marland KitchenMarland KitchenMarland KitchenI just start feeling anxious.  Processed more of her anxious thoughts and ways of handling them to prevent herself from needing to Leave certain situations or "feel worse  about herself because of not handling them well".  Feels good that she has not been "quite as up and down and not really depressed".  Worked some on her "fear of getting anxious" which she states could relate to onset of anxiety.  Was able to stay more on track and in session today with what we were talking about at the time and did not seem to be easily distracted. Did state that she did not feel as motivated today and was not sure why but stated she thought it had something to do with the way her mood had been up and down more recently and her medicine had to be changed to help level out.  May do some written homework between sessions as things will often happen and she wants to be able to talk about them later.   Interventions: Solution-Oriented/Positive Psychology and Ego-Supportive  Diagnosis:   ICD-10-CM   1. Bipolar 1 disorder ([place])  F31.9      Treatment goal plan:  Patient not signing treatment plan on computer screen due to Covid.  Treatment goals: Treatment goals remain on treatment plan as patient works with strategies to achieve her goals. Progress is assessed each session and is documented in treatment note. Long term goal: Reduce overall level, frequency, and intensity of the anxiety so that daily functioning is not impaired. Short-term goal: Increase understanding of the beliefs and messages that produce anxiety, depression, and worry. Strategies: Identify , challenge, and replace anxious/negative/depressive self-talk with positive, realistic, and empowering  self talk.     Plan: Patient today reports not being motivated but ended up participating well in session although a little more subdued.  She stated her primary mood was anxiety however and focused more today on talking through some of the things that tend to the lead her to heightened anxiety and how she manages them currently, and additional ways of trying to manage them.  Encouraged patient in the practice of positive  behaviors that have been helpful to her previously including: Trying to stay more in the present rather than going back to the past or jumping too far ahead into the future, maintaining good boundaries with others as needed, focusing on the things she can control versus cannot, practicing consistent positive self talk, stay in touch with people who are supportive, participate in activities that bring her pleasure including being with pets and grandchildren, getting outside daily and walking, healthy nutrition, looking for the positives within herself, looking for the positives more than negatives each day, saying no when she needs to say no, and feeling good about the strength she shows as she works with goal-directed behaviors to move in a direction that supports a healthy sense of connectedness to others and improved overall emotional health.  Goal review and progress/challenges noted with patient.  Next appt. within 3 weeks.  This record has been created using [employer].  Chart creation errors have been sought, but may not always have been located and corrected.  Such creation errors do not reflect on the standard of medical care provided.    Shanon Ace, LCSW

## 2021-06-19 ENCOUNTER — Other Ambulatory Visit: Payer: Self-pay | Admitting: Psychiatry

## 2021-06-19 DIAGNOSIS — F319 Bipolar disorder, unspecified: Secondary | ICD-10-CM

## 2021-06-22 ENCOUNTER — Ambulatory Visit (INDEPENDENT_AMBULATORY_CARE_PROVIDER_SITE_OTHER): Payer: Medicare Other | Admitting: Psychiatry

## 2021-06-22 ENCOUNTER — Ambulatory Visit: Payer: Medicare Other | Admitting: Psychology

## 2021-06-22 ENCOUNTER — Other Ambulatory Visit: Payer: Self-pay

## 2021-06-22 DIAGNOSIS — F319 Bipolar disorder, unspecified: Secondary | ICD-10-CM | POA: Diagnosis not present

## 2021-06-22 NOTE — Progress Notes (Signed)
[facility] Counselor/Therapist Progress Note  Patient ID: Theresa [name], MRN: [id number],    Date: 06/22/2021  Time Spent: 48 minutes   Treatment Type: Individual Therapy  Reported Symptoms: anxiety  Mental Status Exam:  Appearance:   Casual     Behavior:  Appropriate, Sharing, and Motivated  Motor:  Normal  Speech/Language:   Clear and Coherent  Affect:  anxious  Mood:  anxious  Thought process:  goal directed  Thought content:    Overthinking  Sensory/Perceptual disturbances:    WNL  Orientation:  oriented to person, place, time/date, situation, day of week, month of year, year, and stated date of Oct. 31, 2022  Attention:  Fair  Concentration:  Fair  Memory:  Does have concerns and is going back to neuropsychologist for testing in Jan.   Fund of knowledge:   Fair  Insight:    Good and Fair  Judgment:   Good  Impulse Control:  Fair   Risk Assessment: Danger to Self:  No Self-injurious Behavior: No Danger to Others: No Duty to Warn:no Physical Aggression / Violence:No  Access to Firearms a concern: No  Gang Involvement:No   Subjective:  In today reporting anxiousness and "mixed-up as to why I'm feeling so anxious." Processed some examples of her recent thoughts when they were so escalated and she was very nervous, "but it didn't make sense to be so nervous". Encouraged patient in using Xanax with benefit, petting my dog, slow deliberate deep breathing, taking a nap which shuts my mind off, and taking walks. Sleep is getting better and "fewer bipolar issues". Did work on her homework and shared her written notes in session re: anxiety especially in certain circumstances which we discussed today.Describes her history of dissociating, adding that it has decreased a lot. Anxious about upcoming event within 2 weeks and processed anxious thoughts about it in session today, and working to challenge and change them to more realistic thoughts that do not feed her anxiety.   Patient to continue working with this between sessions, along with some other homework related to frequent triggers to her anxiety.  Patient wanting to focus on this because she states that she feels worse about herself when she does not handle the triggers very well.  Reports most recently not having many ups and downs in her mood, although was more anxious coming in today which often happens for appointments per patient report.  States that her "fear of getting anxious" has been about the same recently and has not gotten any worse.  Stayed focused throughout session and did well in participating.  Interventions: Solution-Oriented/Positive Psychology, Ego-Supportive, and Insight-Oriented  Diagnosis:   ICD-10-CM   1. Bipolar 1 disorder ([place])  F31.9        Treatment goal plan:  Patient not signing treatment plan on computer screen due to Covid.  Treatment goals: Treatment goals remain on treatment plan as patient works with strategies to achieve her goals. Progress is assessed each session and is documented in treatment note. Long term goal: Reduce overall level, frequency, and intensity of the anxiety so that daily functioning is not impaired. Short-term goal: Increase understanding of the beliefs and messages that produce anxiety, depression, and worry. Strategies: Identify , challenge, and replace anxious/negative/depressive self-talk with positive, realistic, and empowering self talk.     Plan:   Patient today showing good motivation today which got even better over the course of the session as she became less anxious and more comfortable, which  she describes as often being the case for her and circumstances of appointments with different health providers.  Although her primary mood was 1 of anxiety, she focused well on explaining how the past couple of weeks have been for her and her current anxieties involving an upcoming family gathering and also a trip there taking to [place] in several  weeks.  Agreed that she was going to stay in the present for now as she wanted to be able to talk about the family event as well as just how her anxiety has been since last session, as noted above.  Encouraged patient in practice of positive behaviors in between sessions including: Focusing on the things she can control versus cannot, staying in the present and not going back to the past nor jumping far ahead into the future, maintaining good boundaries with others as needed, staying in touch with people who are supportive, practicing consistent positive self talk, participating in activities that bring her pleasure including being with her pets and grandchildren, getting outside daily and walking, looking for the positives within herself, healthy nutrition and exercise, looking for the positives more than negatives each day, saying no when she needs to say no, believing in herself more, and realize the strength she shows working with goal-directed behaviors to move in a direction that supports improved emotional health and overall stability including a healthy sense of connectedness to others.  Goal review and progress/challenges noted with patient.  Next appointment within 3 weeks.  This record has been created using [employer].  Chart creation errors have been sought, but may not always have been located and corrected.  Such creation errors do not reflect on the standard of medical care provided.    Shanon Ace, LCSW

## 2021-06-23 ENCOUNTER — Other Ambulatory Visit: Payer: Self-pay | Admitting: Psychiatry

## 2021-06-23 ENCOUNTER — Encounter: Payer: Self-pay | Admitting: Psychiatry

## 2021-06-23 ENCOUNTER — Ambulatory Visit: Payer: Medicare Other | Admitting: Psychiatry

## 2021-06-23 DIAGNOSIS — G2401 Drug induced subacute dyskinesia: Secondary | ICD-10-CM

## 2021-06-23 DIAGNOSIS — F4310 Post-traumatic stress disorder, unspecified: Secondary | ICD-10-CM

## 2021-06-23 DIAGNOSIS — F319 Bipolar disorder, unspecified: Secondary | ICD-10-CM

## 2021-06-23 DIAGNOSIS — F5101 Primary insomnia: Secondary | ICD-10-CM | POA: Diagnosis not present

## 2021-06-23 MED ORDER — OLANZAPINE 7.5 MG PO TABS: 7.5000 mg | ORAL_TABLET | Freq: Every day | ORAL | 0 refills | Status: DC

## 2021-06-23 MED ORDER — AUSTEDO 12 MG PO TABS: ORAL_TABLET | ORAL | 5 refills | Status: DC

## 2021-06-23 MED ORDER — OLANZAPINE 7.5 MG PO TABS: 7.5000 mg | ORAL_TABLET | Freq: Every day | ORAL | Status: DC

## 2021-06-23 MED ORDER — ZOLPIDEM TARTRATE 10 MG PO TABS: 10.0000 mg | ORAL_TABLET | Freq: Every evening | ORAL | 0 refills | Status: DC | PRN

## 2021-06-23 MED ORDER — ALPRAZOLAM 1 MG PO TABS: ORAL_TABLET | ORAL | 2 refills | Status: DC

## 2021-06-23 MED ORDER — CLONAZEPAM 1 MG PO TABS: 1.0000 mg | ORAL_TABLET | Freq: Every day | ORAL | 2 refills | Status: DC

## 2021-06-23 MED ORDER — AUSTEDO 9 MG PO TABS: ORAL_TABLET | ORAL | 5 refills | Status: DC

## 2021-06-23 NOTE — Progress Notes (Signed)
Theresa [name] [id number] [birth year] 65 y.o.  Subjective:   Patient ID:  Theresa [name] is a 65 y.o. (DOB 07-12-1956) female.  Chief Complaint:  Chief Complaint  Patient presents with   Medication Problem    Possible Akathisia    HPI Camey H Conover presents to the office today for follow-up of Bipolar D/O, anxiety and insomnia. She reports, "I think the Bipolar has gotten under control and anxiety is what I am noticing now." She report that she is using Xanax IR prn almost daily. She reports that she is not sure what is triggering anxiety and denies any stressful events. Denies worry.  Has noticed some increase in TD. She reports some restlessness. She reports that she almost felt like panic yesterday. Reports severe anxiety during therapy apt yesterday.   She reports that she has been intentionally losing weight. She reports that she has decreased sugar intake.   Sleeping well. "I'm not feeling as high as I was" and reports improved mania and it is "mostly gone." She and her husband report that manic s/s seem to have resolved. Denies depressed mood. She reports some irritable. Energy is improving. She reports that she is trying to do more and walk the dogs. She reports that her concentration has been impaired due to the restlessness. Denies SI.   She reports that she was feeling excessively tired until last week and then this resolved.   Recently traveled to the aquarium in [place]. She reports that "all the things that normally trigger me, didn't trigger me" to include loud noises and bright lights. Husband reports that she was able to continue visit longer than he expected.   Has apt for neuropsych testing.   Trip is Dec 19- 31st.   Past Psychiatric Medication Trials: Olanzapine Seroquel Saphris Depakote-self-injurious behavior Lamictal Keppra Carbamazepine Xanax Klonopin Ambien Trazodone Benztropine Ingrezza Propranolol- Ineffective  AIMS    Flowsheet Row Office  Visit from 03/09/2021 in [place] Office Visit from 09/30/2020 in [place] Office Visit from 06/16/2020 in [place] Visit from 04/03/2020 in [place] Visit from 03/06/2020 in [place] Total Score 10 9 8 14 19       [place] Office Visit from 02/28/2017 in [place] [facility] [place]  Total Score (max 30 points ) 29      PHQ2-9    [place] Visit from 02/28/2017 in [place] [facility] [place] Visit from 09/19/2015 in [facility] at [place] from 06/05/2015 in [place] [facility] [place] from 03/10/2015 in [facility] at [facility]  PHQ-2 Total Score 0 0 0 0        Review of Systems:  Review of Systems  Musculoskeletal:  Negative for gait problem.  Neurological:  Positive for tremors.  Psychiatric/Behavioral:         Please refer to HPI   Medications: I have reviewed the patient's current medications.  Current Outpatient Medications  Medication Sig Dispense Refill   ALPRAZolam (XANAX XR) 2 MG 24 hr tablet TAKE 1 TABLET BY MOUTH IN  THE MORNING AND AT BEDTIME 180 tablet 0   carbamazepine (CARBATROL) 300 MG 12 hr capsule TAKE 1 CAPSULE BY MOUTH IN  THE MORNING AND 2 CAPSULES  BY MOUTH AT BEDTIME 270 capsule 3   cetirizine (ZYRTEC) 10 MG tablet Take 10 mg by mouth daily as needed.  fluticasone (FLONASE) 50 MCG/ACT nasal spray Place into both nostrils daily as needed.      levETIRAcetam (KEPPRA) 500 MG tablet Take 1 tablet (500 mg total) by mouth 2 (two) times daily. 180 tablet 3   lovastatin (MEVACOR) 20 MG tablet Take 1 tablet (20 mg total) by mouth daily. 90 tablet 3   Melatonin 10 MG TABS Take 1 tablet by mouth at bedtime. Reported on 01/30/2016     Multiple Vitamins-Minerals (CENTRUM SILVER PO) Take by mouth.     pantoprazole (PROTONIX) 40 MG tablet Take 1 tablet  (40 mg total) by mouth daily. Take for 4- 12 weeks as needed 90 tablet 3   polyethylene glycol (MIRALAX / GLYCOLAX) 17 g packet Take 17 g by mouth daily as needed.     traZODone (DESYREL) 100 MG tablet Take 1-2 tablets (100-200 mg total) by mouth at bedtime as needed. for sleep 180 tablet 3   ALPRAZolam (XANAX) 1 MG tablet TAKE 1 TABLET BY MOUTH  DAILY AS NEEDED FOR SEVERE  ANXIETY 30 tablet 2   [START ON 07/06/2021] clonazePAM (KLONOPIN) 1 MG tablet Take 1 tablet (1 mg total) by mouth at bedtime. 30 tablet 2   COVID-19 mRNA bivalent vaccine, Pfizer, injection Inject into the muscle. 0.3 mL 0   COVID-19 mRNA vaccine, Moderna, (MODERNA COVID-19 VACCINE) 100 MCG/0.5ML injection Inject into the muscle. 0.25 mL 0   Deutetrabenazine (AUSTEDO) 12 MG TABS Take 1 tablet po BID with a 9 mg tablet to equal 21 mg po BID 60 tablet 5   Deutetrabenazine (AUSTEDO) 9 MG TABS Take 1 tab po BID with a 12 mg tab to equal 21 mg BID 60 tablet 5   OLANZapine (ZYPREXA) 7.5 MG tablet Take 1 tablet (7.5 mg total) by mouth at bedtime. 90 tablet 0   [START ON 07/23/2021] zolpidem (AMBIEN) 10 MG tablet Take 1 tablet (10 mg total) by mouth at bedtime as needed. 30 tablet 0   No current facility-administered medications for this visit.    Medication Side Effects: Other: TD and akathesia  Allergies:  Allergies  Allergen Reactions   Lamictal [Lamotrigine]     Acute renal failure   Ropinirole Nausea And Vomiting   Tramadol    Codeine Swelling and Rash    Can take hydrocodone   Penicillins     Abdominal pain    Past Medical History:  Diagnosis Date   Adenomatous colon polyp    Allergy    Anxiety    Bipolar 1 disorder ([place])    Blood transfusion without reported diagnosis    Depression    Elevated LFTs    Hepatic steatosis 05/07/13   Hyperlipidemia    Seizures (HCC)    Tardive dyskinesia     Past Medical History, Surgical history, Social history, and Family history were reviewed and updated as appropriate.    Please see review of systems for further details on the patient's review from today.   Objective:   Physical Exam:  There were no vitals taken for this visit.  Physical Exam Constitutional:      General: She is not in acute distress. Musculoskeletal:        General: No deformity.  Neurological:     Mental Status: She is alert and oriented to person, place, and time.     Coordination: Coordination normal.  Psychiatric:        Attention and Perception: Attention and perception normal. She does not perceive auditory or visual hallucinations.  Mood and Affect: Mood normal. Mood is not anxious or depressed. Affect is not labile, blunt, angry or inappropriate.        Speech: Speech normal.        Behavior: Behavior is cooperative.        Thought Content: Thought content normal. Thought content is not paranoid or delusional. Thought content does not include homicidal or suicidal ideation. Thought content does not include homicidal or suicidal plan.        Cognition and Memory: Cognition and memory normal.        Judgment: Judgment normal.     Comments: Insight intact Restless    Lab Review:     Component Value Date/Time   NA 135 12/04/2020 1107   K 4.8 12/04/2020 1107   CL 97 12/04/2020 1107   CO2 30 12/04/2020 1107   GLUCOSE 84 12/04/2020 1107   BUN 10 12/04/2020 1107   CREATININE 0.82 12/04/2020 1107   CREATININE 0.79 05/08/2020 1015   CALCIUM 9.1 12/04/2020 1107   PROT 6.8 12/04/2020 1107   ALBUMIN 4.3 12/04/2020 1107   AST 29 12/04/2020 1107   ALT 38 (H) 12/04/2020 1107   ALKPHOS 109 12/04/2020 1107   BILITOT 0.4 12/04/2020 1107       Component Value Date/Time   WBC 3.9 (L) 05/14/2021 0958   RBC 4.75 05/14/2021 0958   HGB 13.7 05/14/2021 0958   HCT 41.2 05/14/2021 0958   PLT 200.0 05/14/2021 0958   MCV 86.9 05/14/2021 0958   MCV 87.7 02/07/2013 1110   MCH 29.8 05/08/2020 1015   MCHC 33.2 05/14/2021 0958   RDW 13.5 05/14/2021 0958    No results found  for: POCLITH, LITHIUM   Lab Results  Component Value Date   CBMZ 5.2 02/01/2019     .res Assessment: Plan:   Pt seen for 30 minutes and time spent discussing that she may be experiencing akathisia due to higher dose of Olanzapine and describing akathisia. Discussed that dose reduction typically will alleviate akathisia and therefore recommend decreasing dose of Olanzapine to 5 mg at this time to improve akathisia and then resuming 7.5 mg dose 4-5 days after akathisia has resolved. Discussed that Olanzapine 7.5 mg po QHS may be an effective maintenance dose since she has had breakthrough manic s/s on multiple occasions with 5 mg dose and then has worsening TD with 10 mg dose.  Continue Carbamazepine 300 mg po q am and 600 mg po QHS for mood stabilization.  Continue Klonopin 1 mg po QHS for insomnia. Discussed considering gradual dose reduction once akathisia resolves since benzodiazepines typically help relieve akathisia.  Continue [place] 21 mg po BID for TD.  Continue Trazodone 100-200 mg po QHS prn insomnia.  Continue Ambien 10 mg po QHS prn insomnia.  Continue Xanax prn anxiety. Discussed that usage would likely decrease once akathisia improves.  Continue Xanax XR 2 mg po BID for anxiety.  Recommend continuing therapy with Rinaldo Cloud, LCSW.  Pt to follow-up in 6 weeks or sooner if clinically indicated. Discussed that follow-up could be extended out if s/s are well controlled.  Patient advised to contact office with any questions, adverse effects, or acute worsening in signs and symptoms.   Tanza was seen today for medication problem.  Diagnoses and all orders for this visit:  Bipolar 1 disorder ([place]) -     Discontinue: OLANZapine (ZYPREXA) 7.5 MG tablet; Take 1 tablet (7.5 mg total) by mouth at bedtime. -     OLANZapine (ZYPREXA)  7.5 MG tablet; Take 1 tablet (7.5 mg total) by mouth at bedtime.  Posttraumatic stress disorder -     ALPRAZolam (XANAX) 1 MG tablet; TAKE 1 TABLET BY  MOUTH  DAILY AS NEEDED FOR SEVERE  ANXIETY -     clonazePAM (KLONOPIN) 1 MG tablet; Take 1 tablet (1 mg total) by mouth at bedtime.  Primary insomnia -     zolpidem (AMBIEN) 10 MG tablet; Take 1 tablet (10 mg total) by mouth at bedtime as needed. -     clonazePAM (KLONOPIN) 1 MG tablet; Take 1 tablet (1 mg total) by mouth at bedtime.  Tardive dyskinesia -     Deutetrabenazine (AUSTEDO) 12 MG TABS; Take 1 tablet po BID with a 9 mg tablet to equal 21 mg po BID -     Deutetrabenazine (AUSTEDO) 9 MG TABS; Take 1 tab po BID with a 12 mg tab to equal 21 mg BID    Please see After Visit Summary for patient specific instructions.  Future Appointments  Date Time Provider [place]  07/13/2021 11:00 AM Shanon Ace, LCSW [facility] None  08/03/2021 11:00 AM Shanon Ace, LCSW [facility] None  08/04/2021 11:00 AM [name], PMHNP CP-CP None  08/31/2021 11:00 AM Shanon Ace, LCSW CP[facility] None  09/01/2021 11:00 AM [name], PMHNP CP[facility] None  09/28/2021 11:00 AM Shanon Ace, LCSW [facility] None  09/29/2021  3:30 PM Cameron Sprang, MD [facility] None  10/26/2021 11:00 AM Shanon Ace, LCSW CP[facility] None    No orders of the defined types were placed in this encounter.   -------------------------------

## 2021-06-25 ENCOUNTER — Other Ambulatory Visit: Payer: Self-pay | Admitting: Psychiatry

## 2021-06-25 DIAGNOSIS — F319 Bipolar disorder, unspecified: Secondary | ICD-10-CM

## 2021-07-10 ENCOUNTER — Other Ambulatory Visit: Payer: Self-pay | Admitting: Psychiatry

## 2021-07-10 DIAGNOSIS — F319 Bipolar disorder, unspecified: Secondary | ICD-10-CM

## 2021-07-10 DIAGNOSIS — F5101 Primary insomnia: Secondary | ICD-10-CM

## 2021-07-10 DIAGNOSIS — F4310 Post-traumatic stress disorder, unspecified: Secondary | ICD-10-CM

## 2021-07-10 MED ORDER — ALPRAZOLAM ER 1 MG PO TB24: ORAL_TABLET | ORAL | 0 refills | Status: DC

## 2021-07-10 NOTE — Telephone Encounter (Signed)
Last filled Ambien 11/09 #30 but she gets through [facility] is why they are requesting now. May want to add refills if appropriate.

## 2021-07-13 ENCOUNTER — Ambulatory Visit: Payer: Medicare Other | Admitting: Psychology

## 2021-07-13 ENCOUNTER — Ambulatory Visit (INDEPENDENT_AMBULATORY_CARE_PROVIDER_SITE_OTHER): Payer: Medicare Other | Admitting: Psychiatry

## 2021-07-13 ENCOUNTER — Other Ambulatory Visit: Payer: Self-pay

## 2021-07-13 DIAGNOSIS — F319 Bipolar disorder, unspecified: Secondary | ICD-10-CM

## 2021-07-13 NOTE — Progress Notes (Signed)
[facility] Counselor/Therapist Progress Note  Patient ID: Theresa [name], MRN: [id number],    Date: 07/13/2021  Time Spent: 45 minutes   Treatment Type: Individual Therapy  Reported Symptoms: anxiety and sometimes "feel like I'm crawling out of my skin"  Mental Status Exam:  Appearance:   Casual     Behavior:  Appropriate, Sharing, and Motivated  Motor:  Normal  Speech/Language:   Clear and Coherent  Affect:  anxious  Mood:  "Some anxiousness but not really bad"  Thought process:  goal directed  Thought content:    WNL  Sensory/Perceptual disturbances:    WNL  Orientation:  oriented to person, place, time/date, situation, day of week, month of year, year, and stated date of Nov. 21, 2022  Attention:  Good  Concentration:  Good  Memory:  "I tend to forget easily"; Am getting my memory tested in Jan. 2023  Fund of knowledge:   Good  Insight:    Good and Fair  Judgment:   Good  Impulse Control:  Good   Risk Assessment: Danger to Self:  No Self-injurious Behavior: No Danger to Others: No Duty to Warn:no Physical Aggression / Violence:No  Access to Firearms a concern: No  Gang Involvement:No   Subjective:  Patient in today reporting her meds were lowered at last appt with her med provider and is feeling less anxious and "less tardive dyskinesia." Can do some housework and other functioning.  Anxiousness overall has reportedly lessened. Sleep has definitely improved.  Finds some benefit by taking walks, taking a nap, using Xanax, petting her dog, slow deep breathing exercises. "Fewer bipolar issues, not as manic as I was past few months. Processed patient's homework which seemed helpful to her, focusing on specific anxieties. Shares she tends to look at some situations as being anxious and closed off, but beginning to work to seeing those situations as being protective. Wants to work more on putting "words and feelings to my anxiety" and will continue these between  sessions. Has done lots of work on her ptsd and dissociating , and feels good about that. Not as many ups and downs experienced as before. Some slight decrease in her fear of being anxious.  Trying "not to assume that the anxeity wil remain with her.  Interventions: Cognitive Behavioral Therapy and Ego-Supportive  Treatment goal plan:  Patient not signing treatment plan on computer screen due to Covid.  Treatment goals: Treatment goals remain on treatment plan as patient works with strategies to achieve her goals. Progress is assessed each session and is documented in treatment note. Long term goal: Reduce overall level, frequency, and intensity of the anxiety so that daily functioning is not impaired. Short-term goal: Increase understanding of the beliefs and messages that produce anxiety, depression, and worry. Strategies: Identify , challenge, and replace anxious/negative/depressive self-talk with positive, realistic, and empowering self talk.    Diagnosis:   ICD-10-CM   1. Bipolar 1 disorder ([place])  F31.9       Plan:  Patient today showing good motivation and participation in session today as she did more work with her anxious thoughts and how they affect her mood and her behaviors at home and in the mix with other people such as extended family.  Reports that she has noticed decreased some in her anxiety.  Explains she does get anxious before a medical appointment but today states that once we got in to my office and shut the door, she was calm and certainly appeared  calm.  Explains how it felt more protective versus anxiety producing to come in to an office and talk.  Trying to stay in the present and not make assumptions that might lead to anxiety.  Seemed to feel some more confident today in herself as she talked.  Encouraged patient and her practice of positive behaviors including: Believing in herself more, focusing on things she can control versus cannot, positive self talk, staying in  the present and not going back to the past nor jumping far ahead into the future, maintaining good boundaries with others, staying in touch with people who are supportive, participating in activities that bring her pleasure including being with her pets and grandchildren, getting outside daily and walking, finding the positives within herself, healthy nutrition and exercise, saying no when she needs to say no, and feel good about the strength she shows working with goal-directed behaviors to move in a direction that supports improved emotional health, a healthy sense of connectedness to others, and overall wellbeing.  Goal review and progress/challenges noted with patient.  Next appointment within 3 weeks.  This record has been created using [employer].  Chart creation errors have been sought, but may not always have been located and corrected.  Such creation errors do not reflect on the standard of medical care provided.    Shanon Ace, LCSW

## 2021-07-20 ENCOUNTER — Other Ambulatory Visit: Payer: Self-pay | Admitting: Family Medicine

## 2021-07-20 ENCOUNTER — Other Ambulatory Visit: Payer: Self-pay | Admitting: Psychiatry

## 2021-07-20 ENCOUNTER — Telehealth: Payer: Self-pay | Admitting: Psychiatry

## 2021-07-20 DIAGNOSIS — F4310 Post-traumatic stress disorder, unspecified: Secondary | ICD-10-CM

## 2021-07-20 NOTE — Telephone Encounter (Signed)
Mail order pharmacy was out of stock pt requesting refill here

## 2021-07-20 NOTE — Telephone Encounter (Signed)
Theresa [name] says she will need her Xanax XR 2.0mg  BID sent in to another pharmacy because her mail order pharmacy [facility], does not have 2.0mg  or 1.0mg  in stock. Theresa [name] has checked and [facility] has it [facility]. She would like it sent in there please- [facility] [address], in [place].

## 2021-07-20 NOTE — Telephone Encounter (Signed)
Pended.

## 2021-08-01 NOTE — Progress Notes (Deleted)
[place] at [facility] [address], [place], [place] [address] [phone]  Date:  08/05/2021   Name:  Theresa [name]   DOB:  1956-08-04   MRN:  [id number]  PCP:  Theresa Mclean, MD    Chief Complaint: No chief complaint on file.   History of Present Illness:  Theresa [name] is a 65 y.o. very pleasant female patient who presents with the following:  Patient seen today with concern of bladder discomfort Most recent visit with myself was in September for physical History of bipolar disorder and epilepsy, tardive dyskinesia, dyslipidemia Her mental health care as per [name], nurse practitioner at [facility] She does rarely have a seizure  Can offer pneumonia vaccine as she is now 78 Flu vaccine and COVID-vaccine are up-to-date  Patient Active Problem List   Diagnosis Date Noted   Dyslipidemia 05/05/2020   Manic bipolar I disorder in partial remission ([place]) 07/25/2018   Posttraumatic stress disorder 07/25/2018   Generalized idiopathic epilepsy and epileptic syndromes, not intractable, without status epilepticus ([place]) 06/24/2017   Tardive dyskinesia 01/21/2017   Special screening for malignant neoplasms, colon 06/16/2012   Personal history of colonic polyps 06/16/2012   Bipolar disorder ([place]) 04/02/2012   Idiopathic generalized epilepsy ([place]) 10/15/2011    Past Medical History:  Diagnosis Date   Adenomatous colon polyp    Allergy    Anxiety    Bipolar 1 disorder ([place])    Blood transfusion without reported diagnosis    Depression    Elevated LFTs    Hepatic steatosis 05/07/13   Hyperlipidemia    Seizures ([place])    Tardive dyskinesia     Past Surgical History:  Procedure Laterality Date   APPENDECTOMY     COLONOSCOPY  06/16/2012   Procedure: COLONOSCOPY;  Surgeon: Lafayette Dragon, MD;  Location: [facility];  Service: Endoscopy;  Laterality: N/A;   DILATION AND CURETTAGE OF UTERUS     KNEE  ARTHROSCOPY WITH MENISCAL REPAIR Left    NECK SURGERY      Social History   Tobacco Use   Smoking status: Never   Smokeless tobacco: Never  Vaping Use   Vaping Use: Never used  Substance Use Topics   Alcohol use: Yes    Comment: occasionaly 1 a month   Drug use: No    Family History  Problem Relation Age of Onset   Colon polyps Mother    Cancer Mother    Heart disease Mother    Alcoholism Mother    Cancer Father    Alcoholism Father    Bipolar disorder Father    Drug abuse Brother    Bipolar disorder Other    Colon cancer Neg Hx    Stomach cancer Neg Hx    Breast cancer Neg Hx     Allergies  Allergen Reactions   Lamictal [Lamotrigine]     Acute renal failure   Ropinirole Nausea And Vomiting   Tramadol    Codeine Swelling and Rash    Can take hydrocodone   Penicillins     Abdominal pain    Medication list has been reviewed and updated.  Current Outpatient Medications on File Prior to Visit  Medication Sig Dispense Refill   ALPRAZolam (XANAX XR) 1 MG 24 hr tablet Take 2 tablets (2 mg) by mouth in the morning and 2 tablets (2 mg) at bedtime. 360 tablet 0   ALPRAZolam (XANAX XR) 2 MG 24  hr tablet TAKE 1 TABLET (2 MG TOTAL) BY MOUTH IN THE MORNING AND AT BEDTIME. 60 tablet 0   ALPRAZolam (XANAX) 1 MG tablet TAKE 1 TABLET BY MOUTH  DAILY AS NEEDED FOR SEVERE  ANXIETY 30 tablet 2   carbamazepine (CARBATROL) 300 MG 12 hr capsule TAKE 1 CAPSULE BY MOUTH IN  THE MORNING AND 2 CAPSULES  BY MOUTH AT BEDTIME 270 capsule 3   cetirizine (ZYRTEC) 10 MG tablet Take 10 mg by mouth daily as needed.      clonazePAM (KLONOPIN) 1 MG tablet Take 1 tablet (1 mg total) by mouth at bedtime. 30 tablet 2   COVID-19 mRNA bivalent vaccine, Pfizer, injection Inject into the muscle. 0.3 mL 0   COVID-19 mRNA vaccine, Moderna, (MODERNA COVID-19 VACCINE) 100 MCG/0.5ML injection Inject into the muscle. 0.25 mL 0   Deutetrabenazine (AUSTEDO) 12 MG TABS Take 1 tablet po BID with a 9 mg tablet to  equal 21 mg po BID 60 tablet 5   Deutetrabenazine (AUSTEDO) 9 MG TABS Take 1 tab po BID with a 12 mg tab to equal 21 mg BID 60 tablet 5   fluticasone (FLONASE) 50 MCG/ACT nasal spray Place into both nostrils daily as needed.      levETIRAcetam (KEPPRA) 500 MG tablet Take 1 tablet (500 mg total) by mouth 2 (two) times daily. 180 tablet 3   lovastatin (MEVACOR) 20 MG tablet TAKE 1 TABLET BY MOUTH  DAILY 90 tablet 1   Melatonin 10 MG TABS Take 1 tablet by mouth at bedtime. Reported on 01/30/2016     Multiple Vitamins-Minerals (CENTRUM SILVER PO) Take by mouth.     OLANZapine (ZYPREXA) 7.5 MG tablet TAKE 1 TABLET BY MOUTH AT  BEDTIME 90 tablet 3   pantoprazole (PROTONIX) 40 MG tablet Take 1 tablet (40 mg total) by mouth daily. Take for 4- 12 weeks as needed 90 tablet 3   polyethylene glycol (MIRALAX / GLYCOLAX) 17 g packet Take 17 g by mouth daily as needed.     traZODone (DESYREL) 100 MG tablet Take 1-2 tablets (100-200 mg total) by mouth at bedtime as needed. for sleep 180 tablet 3   zolpidem (AMBIEN) 10 MG tablet TAKE 1 TABLET BY MOUTH AT  BEDTIME AS NEEDED 30 tablet 5   No current facility-administered medications on file prior to visit.    Review of Systems:  As per HPI- otherwise negative.   Physical Examination: There were no vitals filed for this visit. There were no vitals filed for this visit. There is no height or weight on file to calculate BMI. Ideal Body Weight:    GEN: no acute distress. HEENT: Atraumatic, Normocephalic.  Ears and Nose: No external deformity. CV: RRR, No M/G/R. No JVD. No thrill. No extra heart sounds. PULM: CTA B, no wheezes, crackles, rhonchi. No retractions. No resp. distress. No accessory muscle use. ABD: S, NT, ND, +BS. No rebound. No HSM. EXTR: No c/c/e PSYCH: Normally interactive. Conversant.    Assessment and Plan: ***  Signed Lamar Blinks, MD

## 2021-08-03 ENCOUNTER — Ambulatory Visit: Payer: Medicare Other | Admitting: Psychology

## 2021-08-03 ENCOUNTER — Ambulatory Visit: Payer: Medicare Other | Admitting: Psychiatry

## 2021-08-03 ENCOUNTER — Other Ambulatory Visit: Payer: Self-pay

## 2021-08-03 DIAGNOSIS — F319 Bipolar disorder, unspecified: Secondary | ICD-10-CM | POA: Diagnosis not present

## 2021-08-03 NOTE — Progress Notes (Signed)
[facility] Counselor/Therapist Progress Note  Patient ID: Theresa [name], MRN: [id number],    Date: 08/03/2021  Time Spent: 48 minutes   Treatment Type: Individual Therapy  Reported Symptoms: anxiety (improved), sleeping pretty well, "I do get depressed sometimes" but not bad right now, feeling more calm, is getting excited about vacation starting next Monday through Dec. 31st.  Mental Status Exam:  Appearance:   Casual     Behavior:  Appropriate, Sharing, and Motivated  Motor:  Normal  Speech/Language:   anxious  Affect:  anxiouos  Mood:  anxious ("but less")  Thought process:  goal directed  Thought content:    Some ruminating and some obsessiveness  Sensory/Perceptual disturbances:    WNL  Orientation:  oriented to person, place, time/date, situation, day of week, month of year, year, and stated date of Dec. 12, 2022  Attention:  Good  Concentration:  Good  Memory:  Reports some short term memory issues and her Dr is aware and is having some testing done in January 2023.   Fund of knowledge:   Good  Insight:    Good and Fair  Judgment:   Good and Fair  Impulse Control:  Good   Risk Assessment: Danger to Self:  No Self-injurious Behavior: No Danger to Others: No Duty to Warn:no Physical Aggression / Violence:No  Access to Firearms a concern: No  Gang Involvement:No   Subjective: Patient in today reporting "feeling about the same", anxiety improved some, sleep is good, some depression, decreased anxiety, feeling more calm, some ruminating and some obsessiveness. Looking forward to vacation within next 2 weeks with son and his wife. Reports her tardive dyskinesia is better.Sleep is good. Still having "less bipolar issues" and my meds are helping. Shared homework re: feelings and her anxiety. "I have trouble putting feelings into words." Taking walks and slow deliberated deep breathing can help, as well as petting her dog, and taking a nap, all help my anxiety.Feels  empowered in discussing "what helps versus what hurts". Reflected on some painful situations in the past that led to her dissociation and threw them away "as I don't need them around any more. " "Does make me question my reality at times and am able to talk through my concerns."Reports that talking through these things she feels helps her and she is able to stay away from self blame which is been an issue some in the past.  Interventions: Solution-Oriented/Positive Psychology and Ego-Supportive   Treatment goal plan:  Patient not signing treatment plan on computer screen [name] to Covid.  Treatment goals: Treatment goals remain on treatment plan as patient works with strategies to achieve her goals. Progress is assessed each session and is documented in treatment note. Long term goal: Reduce overall level, frequency, and intensity of the anxiety so that daily functioning is not impaired. Short-term goal: Increase understanding of the beliefs and messages that produce anxiety, depression, and worry. Strategies: Identify , challenge, and replace anxious/negative/depressive self-talk with positive, realistic, and empowering self talk.    Diagnosis:   ICD-10-CM   1. Bipolar 1 disorder ([place])  F31.9      Plan:   Patient today showing good motivation and active participation in session today as she worked more on some anxious thoughts and how they affect her behavior and mood.  She shared that her anxiety has decreased more and she was encouraged with that.  Also shared more today from her homework which tied into her anxious thoughts.  Working  to express her anxiety more in words as she states she has trouble putting words with her feelings.  She did better with this today and reports feeling less ups and downs when she talks about her feelings "but still some difficulty in describing them."  Anxiety has lessened and she feels that talking through anxious feelings helps them decrease.  Does intentionally  try to stay more in the present and not make assumptions that used to often lead to anxiety.  Expressing some improved confidence in herself and her ability to cope.  Discussed some apprehension about upcoming trip but feels that is tied more to anxiety, even though it is lessened.  Looked at some ways of managing her anxiety, some of which she is already doing.  Encouraged patient in her practice of positive behaviors including: Believing more in her ability to make important changes, focusing on things that she can control, more consistent positive self talk, staying in the present and not going back to the past nor jumping too far into the future, maintaining good boundaries with others, staying in touch with people who are supportive, participating in activities that bring her pleasure including being with her pets and grandchildren, getting outside daily and walking, finding the positives within herself, healthy nutrition and exercise, saying no when she needs to say no, and recognize the strength she shows working with goal-directed behaviors to move in a direction that supports a healthy sense of connectedness to others and improved emotional health.  Goal review and progress/challenges noted with patient.  Next appointment within 3 weeks.  This record has been created using [employer].  Chart creation errors have been sought, but may not always have been located and corrected.  Such creation errors do not reflect on the standard of medical care provided.   Shanon Ace, LCSW

## 2021-08-04 ENCOUNTER — Ambulatory Visit (INDEPENDENT_AMBULATORY_CARE_PROVIDER_SITE_OTHER): Payer: Medicare Other | Admitting: Psychiatry

## 2021-08-04 ENCOUNTER — Encounter: Payer: Self-pay | Admitting: Psychiatry

## 2021-08-04 ENCOUNTER — Telehealth: Payer: Self-pay

## 2021-08-04 DIAGNOSIS — F319 Bipolar disorder, unspecified: Secondary | ICD-10-CM

## 2021-08-04 DIAGNOSIS — F4310 Post-traumatic stress disorder, unspecified: Secondary | ICD-10-CM

## 2021-08-04 MED ORDER — ALPRAZOLAM ER 2 MG PO TB24: 2.0000 mg | ORAL_TABLET | Freq: Two times a day (BID) | ORAL | 0 refills | Status: DC

## 2021-08-04 NOTE — Progress Notes (Addendum)
[facility] [address] [place], [place] [address] [phone]  Date:  08/05/2021   Name:  Theresa [name]   DOB:  07-Aug-1956   MRN:  [id number]  PCP:  Darreld Mclean, MD    Chief Complaint: Urinary Tract Infection (Pt says she thinks she had a bladder infection last week and that it has cleared. She is going to [place] next week and would like to be sure. )   History of Present Illness:  Theresa [name] is a 65 y.o. very pleasant female patient who presents with the following:  Pt seen today with a bladder concern Last visit with myself in September for her CPE History of bipolar disorder and epilepsy, tardive dyskinesia, dyslipidemia  She had noticed more frequent urination esp the last week or so This past Friday it was esp bad and she felt like her bladder was sore (today is Wednesday) if she pressed on it No dysuria or hematuria No fever or back pain, no vomiting  She drank some cranberry juice and this seems to have cleared things up but she wanted to come in anyway to be sure     Patient Active Problem List   Diagnosis Date Noted   Dyslipidemia 05/05/2020   Manic bipolar I disorder in partial remission ([place]) 07/25/2018   Posttraumatic stress disorder 07/25/2018   Generalized idiopathic epilepsy and epileptic syndromes, not intractable, without status epilepticus ([place]) 06/24/2017   Tardive dyskinesia 01/21/2017   Special screening for malignant neoplasms, colon 06/16/2012   Personal history of colonic polyps 06/16/2012   Bipolar disorder ([place]) 04/02/2012   Idiopathic generalized epilepsy ([place]) 10/15/2011    Past Medical History:  Diagnosis Date   Adenomatous colon polyp    Allergy    Anxiety    Bipolar 1 disorder ([place])    Blood transfusion without reported diagnosis    Depression    Elevated LFTs    Hepatic steatosis 05/07/13   Hyperlipidemia    Seizures ([place])    Tardive dyskinesia      Past Surgical History:  Procedure Laterality Date   APPENDECTOMY     COLONOSCOPY  06/16/2012   Procedure: COLONOSCOPY;  Surgeon: Lafayette Dragon, MD;  Location: [facility];  Service: Endoscopy;  Laterality: N/A;   DILATION AND CURETTAGE OF UTERUS     KNEE ARTHROSCOPY WITH MENISCAL REPAIR Left    NECK SURGERY      Social History   Tobacco Use   Smoking status: Never   Smokeless tobacco: Never  Vaping Use   Vaping Use: Never used  Substance Use Topics   Alcohol use: Yes    Comment: occasionaly 1 a month   Drug use: No    Family History  Problem Relation Age of Onset   Colon polyps Mother    Cancer Mother    Heart disease Mother    Alcoholism Mother    Cancer Father    Alcoholism Father    Bipolar disorder Father    Drug abuse Brother    Bipolar disorder Other    Colon cancer Neg Hx    Stomach cancer Neg Hx    Breast cancer Neg Hx     Allergies  Allergen Reactions   Lamictal [Lamotrigine]     Acute renal failure   Ropinirole Nausea And Vomiting   Tramadol    Codeine Swelling and Rash    Can take hydrocodone   Penicillins  Abdominal pain    Medication list has been reviewed and updated.  Current Outpatient Medications on File Prior to Visit  Medication Sig Dispense Refill   [START ON 08/18/2021] ALPRAZolam (XANAX XR) 2 MG 24 hr tablet Take 1 tablet (2 mg total) by mouth in the morning and at bedtime. 180 tablet 0   ALPRAZolam (XANAX) 1 MG tablet TAKE 1 TABLET BY MOUTH  DAILY AS NEEDED FOR SEVERE  ANXIETY 30 tablet 2   carbamazepine (CARBATROL) 300 MG 12 hr capsule TAKE 1 CAPSULE BY MOUTH IN  THE MORNING AND 2 CAPSULES  BY MOUTH AT BEDTIME 270 capsule 3   cetirizine (ZYRTEC) 10 MG tablet Take 10 mg by mouth daily as needed.      clonazePAM (KLONOPIN) 1 MG tablet Take 1 tablet (1 mg total) by mouth at bedtime. 30 tablet 2   COVID-19 mRNA bivalent vaccine, Pfizer, injection Inject into the muscle. 0.3 mL 0   COVID-19 mRNA vaccine, Moderna, (MODERNA  COVID-19 VACCINE) 100 MCG/0.5ML injection Inject into the muscle. 0.25 mL 0   Deutetrabenazine (AUSTEDO) 12 MG TABS Take 1 tablet po BID with a 9 mg tablet to equal 21 mg po BID 60 tablet 5   Deutetrabenazine (AUSTEDO) 9 MG TABS Take 1 tab po BID with a 12 mg tab to equal 21 mg BID 60 tablet 5   fluticasone (FLONASE) 50 MCG/ACT nasal spray Place into both nostrils daily as needed.      levETIRAcetam (KEPPRA) 500 MG tablet Take 1 tablet (500 mg total) by mouth 2 (two) times daily. 180 tablet 3   lovastatin (MEVACOR) 20 MG tablet TAKE 1 TABLET BY MOUTH  DAILY 90 tablet 1   Melatonin 10 MG TABS Take 1 tablet by mouth at bedtime. Reported on 01/30/2016     Multiple Vitamins-Minerals (CENTRUM SILVER PO) Take by mouth.     OLANZapine (ZYPREXA) 7.5 MG tablet TAKE 1 TABLET BY MOUTH AT  BEDTIME (Patient taking differently: 5 mg.) 90 tablet 3   pantoprazole (PROTONIX) 40 MG tablet Take 1 tablet (40 mg total) by mouth daily. Take for 4- 12 weeks as needed 90 tablet 3   polyethylene glycol (MIRALAX / GLYCOLAX) 17 g packet Take 17 g by mouth daily as needed.     traZODone (DESYREL) 100 MG tablet Take 1-2 tablets (100-200 mg total) by mouth at bedtime as needed. for sleep 180 tablet 3   zolpidem (AMBIEN) 10 MG tablet TAKE 1 TABLET BY MOUTH AT  BEDTIME AS NEEDED 30 tablet 5   No current facility-administered medications on file prior to visit.    Review of Systems:  As per HPI- otherwise negative.   Physical Examination: Vitals:   08/05/21 1539  BP: 122/80  Pulse: 77  Resp: 18  Temp: 97.8 F (36.6 C)  SpO2: 97%   Vitals:   08/05/21 1539  Weight: 183 lb (83 kg)  Height: 5\' 6"  (1.676 m)   Body mass index is 29.54 kg/m. Ideal Body Weight: Weight in (lb) to have BMI = 25: 154.6  GEN: no acute distress. HEENT: Atraumatic, Normocephalic.  Ears and Nose: No external deformity. CV: RRR, No M/G/R. No JVD. No thrill. No extra heart sounds. PULM: CTA B, no wheezes, crackles, rhonchi. No retractions.  No resp. distress. No accessory muscle use. ABD: S, NT, ND, +BS. No rebound. No HSM. EXTR: No c/c/e PSYCH: Normally interactive. Conversant.  Belly is benign, no CVA tenderness  Results for orders placed or performed in visit on 08/05/21  POCT urinalysis dipstick  Result Value Ref Range   Color, UA yellow yellow   Clarity, UA cloudy (A) clear   Glucose, UA negative negative mg/dL   Bilirubin, UA negative negative   Ketones, POC UA negative negative mg/dL   Spec Grav, UA 1.020 1.010 - 1.025   Blood, UA negative negative   pH, UA 6.5 5.0 - 8.0   Protein Ur, POC negative negative mg/dL   Urobilinogen, UA 0.2 0.2 or 1.0 E.U./dL   Nitrite, UA Negative Negative   Leukocytes, UA Moderate (2+) (A) Negative     Assessment and Plan: Bladder pain - Plan: Urine Culture, POCT urinalysis dipstick, nitrofurantoin, macrocrystal-monohydrate, (MACROBID) 100 MG capsule  Suspicious for UTI Await culture Start on macrobid for now She will alert me if getting worse or if any change in her sx  Signed Lamar Blinks, MD   Addendum 12/16, received labs as below.  Message to patient  Results for orders placed or performed in visit on 08/05/21  Urine Culture   Specimen: Urine  Result Value Ref Range   MICRO NUMBER: [id number]    SPECIMEN QUALITY: Adequate    Sample Source NOT GIVEN    STATUS: FINAL    Result: No Growth   POCT urinalysis dipstick  Result Value Ref Range   Color, UA yellow yellow   Clarity, UA cloudy (A) clear   Glucose, UA negative negative mg/dL   Bilirubin, UA negative negative   Ketones, POC UA negative negative mg/dL   Spec Grav, UA 1.020 1.010 - 1.025   Blood, UA negative negative   pH, UA 6.5 5.0 - 8.0   Protein Ur, POC negative negative mg/dL   Urobilinogen, UA 0.2 0.2 or 1.0 E.U./dL   Nitrite, UA Negative Negative   Leukocytes, UA Moderate (2+) (A) Negative

## 2021-08-04 NOTE — Progress Notes (Signed)
Theresa [name] [id number] 1956-01-25 65 y.o.  Subjective:   Patient ID:  Theresa [name] is a 65 y.o. (DOB 24-Jul-1956) female.  Chief Complaint:  Chief Complaint  Patient presents with   Follow-up    Bipolar D/O, anxiety, insomnia, TD     HPI Theresa [name] presents to the office today for follow-up of Bipolar D/O, anxiety, insomnia, TD. She reports, "I'm doing good." She reports that she stayed on Olanzapine 5 mg po QHS instead of increasing to 7.5 mg po QHS. She reports that she noticed wt gain with Olanzapine at the higher dose. No change in appetite or food intake. She reports that her mood is stable. Denies manic s/s. She reports that "anxiety is there, but not as bad as it was." She reports that she felt less anxious in therapy yesterday. Denies panic. Sleeping "really well." Energy and motivation have been ok. She reports that she is "excited" about her trip and is trying to tamper this. Denies SI.   She reports continued memory difficulties. She has neuropsych testing in January.   Her mail order pharmacy did not have Xanax XR 2 mg in stock and she filled it locally instead.   Walking and/or riding stationary bike daily.   Past Psychiatric Medication Trials: Olanzapine Seroquel Saphris Depakote-self-injurious behavior Lamictal Keppra Carbamazepine Xanax Klonopin Ambien Trazodone Benztropine Ingrezza Propranolol- Ineffective  AIMS    Flowsheet Row Office Visit from 08/04/2021 in [facility] Visit from 03/09/2021 in [place] Visit from 09/30/2020 in [facility] Visit from 06/16/2020 in [facility] Visit from 04/03/2020 in [facility] Total Score 9 10 9 8 14       [place] Office Visit from 02/28/2017 in [place] at [facility]  Total Score (max 30 points ) 29      PHQ2-9    [place] Visit from 02/28/2017 in [facility] at [place] Visit from 09/19/2015 in [facility] at [facility] from 06/05/2015 in [facility] at [place] from 03/10/2015 in [facility] at [facility]  PHQ-2 Total Score 0 0 0 0        Review of Systems:  Review of Systems  Genitourinary:        Recent urinary frequency and tenderness in lower abdomen  Musculoskeletal:  Negative for gait problem.  Neurological:  Negative for tremors.  Psychiatric/Behavioral:         Please refer to HPI   Medications: I have reviewed the patient's current medications.  Current Outpatient Medications  Medication Sig Dispense Refill   [START ON 08/18/2021] ALPRAZolam (XANAX XR) 2 MG 24 hr tablet Take 1 tablet (2 mg total) by mouth in the morning and at bedtime. 180 tablet 0   ALPRAZolam (XANAX) 1 MG tablet TAKE 1 TABLET BY MOUTH  DAILY AS NEEDED FOR SEVERE  ANXIETY 30 tablet 2   carbamazepine (CARBATROL) 300 MG 12 hr capsule TAKE 1 CAPSULE BY MOUTH IN  THE MORNING AND 2 CAPSULES  BY MOUTH AT BEDTIME 270 capsule 3   cetirizine (ZYRTEC) 10 MG tablet Take 10 mg by mouth daily as needed.      clonazePAM (KLONOPIN) 1 MG tablet Take 1 tablet (1 mg total) by mouth at bedtime. 30 tablet 2   COVID-19 mRNA bivalent vaccine, Pfizer, injection Inject into the muscle. 0.3 mL 0   COVID-19 mRNA vaccine,  Moderna, (MODERNA COVID-19 VACCINE) 100 MCG/0.5ML injection Inject into the muscle. 0.25 mL 0   Deutetrabenazine (AUSTEDO) 12 MG TABS Take 1 tablet po BID with a 9 mg tablet to equal 21 mg po BID 60 tablet 5   Deutetrabenazine (AUSTEDO) 9 MG TABS Take 1 tab po BID with a 12 mg tab to equal 21 mg BID 60 tablet 5   fluticasone (FLONASE) 50 MCG/ACT nasal spray Place into both nostrils daily as needed.      levETIRAcetam (KEPPRA) 500 MG tablet Take 1 tablet (500 mg total) by mouth 2 (two) times daily. 180 tablet 3   lovastatin (MEVACOR) 20 MG tablet TAKE 1 TABLET BY MOUTH  DAILY 90  tablet 1   Melatonin 10 MG TABS Take 1 tablet by mouth at bedtime. Reported on 01/30/2016     Multiple Vitamins-Minerals (CENTRUM SILVER PO) Take by mouth.     OLANZapine (ZYPREXA) 7.5 MG tablet TAKE 1 TABLET BY MOUTH AT  BEDTIME (Patient taking differently: 5 mg.) 90 tablet 3   pantoprazole (PROTONIX) 40 MG tablet Take 1 tablet (40 mg total) by mouth daily. Take for 4- 12 weeks as needed 90 tablet 3   polyethylene glycol (MIRALAX / GLYCOLAX) 17 g packet Take 17 g by mouth daily as needed.     traZODone (DESYREL) 100 MG tablet Take 1-2 tablets (100-200 mg total) by mouth at bedtime as needed. for sleep 180 tablet 3   zolpidem (AMBIEN) 10 MG tablet TAKE 1 TABLET BY MOUTH AT  BEDTIME AS NEEDED 30 tablet 5   No current facility-administered medications for this visit.    Medication Side Effects: Other: Wt gain  Allergies:  Allergies  Allergen Reactions   Lamictal [Lamotrigine]     Acute renal failure   Ropinirole Nausea And Vomiting   Tramadol    Codeine Swelling and Rash    Can take hydrocodone   Penicillins     Abdominal pain    Past Medical History:  Diagnosis Date   Adenomatous colon polyp    Allergy    Anxiety    Bipolar 1 disorder ([place])    Blood transfusion without reported diagnosis    Depression    Elevated LFTs    Hepatic steatosis 05/07/13   Hyperlipidemia    Seizures ([facility])    Tardive dyskinesia     Past Medical History, Surgical history, Social history, and Family history were reviewed and updated as appropriate.   Please see review of systems for further details on the patient's review from today.   Objective:   Physical Exam:  There were no vitals taken for this visit.  Physical Exam Constitutional:      General: She is not in acute distress. Musculoskeletal:        General: No deformity.  Neurological:     Mental Status: She is alert and oriented to person, place, and time.     Coordination: Coordination normal.  Psychiatric:        Attention and  Perception: Attention and perception normal. She does not perceive auditory or visual hallucinations.        Mood and Affect: Mood normal. Mood is not anxious or depressed. Affect is not labile, blunt, angry or inappropriate.        Speech: Speech normal.        Behavior: Behavior normal.        Thought Content: Thought content normal. Thought content is not paranoid or delusional. Thought content does not include homicidal or suicidal  ideation. Thought content does not include homicidal or suicidal plan.        Cognition and Memory: Cognition and memory normal.        Judgment: Judgment normal.     Comments: Insight intact Affect brightens when talking about upcoming trip    Lab Review:     Component Value Date/Time   NA 135 12/04/2020 1107   K 4.8 12/04/2020 1107   CL 97 12/04/2020 1107   CO2 30 12/04/2020 1107   GLUCOSE 84 12/04/2020 1107   BUN 10 12/04/2020 1107   CREATININE 0.82 12/04/2020 1107   CREATININE 0.79 05/08/2020 1015   CALCIUM 9.1 12/04/2020 1107   PROT 6.8 12/04/2020 1107   ALBUMIN 4.3 12/04/2020 1107   AST 29 12/04/2020 1107   ALT 38 (H) 12/04/2020 1107   ALKPHOS 109 12/04/2020 1107   BILITOT 0.4 12/04/2020 1107       Component Value Date/Time   WBC 3.9 (L) 05/14/2021 0958   RBC 4.75 05/14/2021 0958   HGB 13.7 05/14/2021 0958   HCT 41.2 05/14/2021 0958   PLT 200.0 05/14/2021 0958   MCV 86.9 05/14/2021 0958   MCV 87.7 02/07/2013 1110   MCH 29.8 05/08/2020 1015   MCHC 33.2 05/14/2021 0958   RDW 13.5 05/14/2021 0958    No results found for: POCLITH, LITHIUM   Lab Results  Component Value Date   CBMZ 5.2 02/01/2019     .res Assessment: Plan:    Pt seen for 30 minutes and time spent discussing recent shortage of Xanax XR and her concerns about how this may affect next refill. Will send script to mail order with future fill date with note to pharmacy to please notify pt and provider asap if unable to fill due to not being in stock. Discussed that  Xanax XR could be sent to local pharmacy if needed.  Reviewed other medications and pt reports that she has an adequate supply of all her medications for her trip.  She reports that she was concerned about increasing Olanzapine to higher dose since she has noticed weight gain with higher doses in the past. Discussed strategies to minimize weight gain with Olanzapine, such as adding Metformin since studies indicate it may be helpful for antipsychotic induced weight gain. Also discussed Lybalvi since it is a combination of Olanzapine and Samidorphan that is associated with less risk of weight gain compared to Olanzapine. Agreed not to change medication prior to trip and considering these options at next apt. Will continue Olanzapine 5 mg po QHS for mood s/s. Continue Carbamazepine 300 mg po q am and 600 mg po QHS for mood stabilization.  Continue Klonopin 1 mg po QHS for insomnia and anxiety.  Continue Xanax XR 2 mg po BID for anxiety.  Continue Xanax 1 mg po qd prn severe anxiety.  Continue [place] 21 mg po BID for TD.  Continue Trazodone 100 mg-200 mg po QHS for insomnia.  Continue Ambien 10 mg po QHS prn insomnia. Pt to follow up in one month or sooner if clinically indicated.  Recommend continuing therapy with Rinaldo Cloud, LCSW.    Evamaria was seen today for follow-up.  Diagnoses and all orders for this visit:  Posttraumatic stress disorder -     ALPRAZolam (XANAX XR) 2 MG 24 hr tablet; Take 1 tablet (2 mg total) by mouth in the morning and at bedtime.  Bipolar 1 disorder ([place])    Please see After Visit Summary for patient specific instructions.  Future  Appointments  Date Time Provider [place]  08/05/2021  3:40 PM Copland, Gay Filler, MD [facility]  08/31/2021 11:00 AM Shanon Ace, LCSW [facility] None  09/01/2021 11:00 AM [name], PMHNP [facility] None  09/28/2021 11:00 AM Shanon Ace, LCSW [facility] None  09/29/2021  3:30 PM Cameron Sprang, MD [facility] None  10/26/2021 11:00 AM  Shanon Ace, LCSW [facility] None    No orders of the defined types were placed in this encounter.   -------------------------------

## 2021-08-04 NOTE — Telephone Encounter (Signed)
Checking into pt's pharmacy benefits

## 2021-08-05 ENCOUNTER — Ambulatory Visit (INDEPENDENT_AMBULATORY_CARE_PROVIDER_SITE_OTHER): Payer: Medicare Other | Admitting: Family Medicine

## 2021-08-05 ENCOUNTER — Ambulatory Visit: Payer: Medicare Other | Admitting: Family Medicine

## 2021-08-05 VITALS — BP 122/80 | HR 77 | Temp 97.8°F | Resp 18 | Ht 66.0 in | Wt 183.0 lb

## 2021-08-05 DIAGNOSIS — R3989 Other symptoms and signs involving the genitourinary system: Secondary | ICD-10-CM | POA: Diagnosis not present

## 2021-08-05 LAB — POCT URINALYSIS DIP (MANUAL ENTRY)
Bilirubin, UA: NEGATIVE
Blood, UA: NEGATIVE
Glucose, UA: NEGATIVE mg/dL
Ketones, POC UA: NEGATIVE mg/dL
Nitrite, UA: NEGATIVE
Protein Ur, POC: NEGATIVE mg/dL
Spec Grav, UA: 1.02 (ref 1.010–1.025)
Urobilinogen, UA: 0.2 E.U./dL
pH, UA: 6.5 (ref 5.0–8.0)

## 2021-08-05 MED ORDER — NITROFURANTOIN MONOHYD MACRO 100 MG PO CAPS: 100.0000 mg | ORAL_CAPSULE | Freq: Two times a day (BID) | ORAL | 0 refills | Status: DC

## 2021-08-05 NOTE — Telephone Encounter (Signed)
Prior Authorization submitted for LYBALVI 5-10 MG (started with lowest dose) through her Medicare Part D, [employer] approval received effective 08/05/2021-08/22/2022, PA# [id number].  I can go ahead and submit Rx to check cost at pharmacy if you want to proceed.

## 2021-08-05 NOTE — Patient Instructions (Signed)
It was good to see you again today - I will be in touch with your urine culture asap In the meantime start on macrobid twice a day for one week  If you are getting worse please contact me asap

## 2021-08-06 LAB — URINE CULTURE
MICRO NUMBER:: 12756567
Result:: NO GROWTH
SPECIMEN QUALITY:: ADEQUATE

## 2021-08-07 ENCOUNTER — Encounter: Payer: Self-pay | Admitting: Family Medicine

## 2021-08-10 NOTE — Telephone Encounter (Signed)
Was able to discuss with her husband and he reports after [name] read about side effects she decided against it and wants to go the other route when she returns home.   Informed him I would update [name]

## 2021-08-16 ENCOUNTER — Other Ambulatory Visit: Payer: Self-pay | Admitting: Neurology

## 2021-08-16 ENCOUNTER — Other Ambulatory Visit: Payer: Self-pay | Admitting: Family Medicine

## 2021-08-16 ENCOUNTER — Other Ambulatory Visit: Payer: Self-pay | Admitting: Psychiatry

## 2021-08-16 DIAGNOSIS — G40309 Generalized idiopathic epilepsy and epileptic syndromes, not intractable, without status epilepticus: Secondary | ICD-10-CM

## 2021-08-16 DIAGNOSIS — F4310 Post-traumatic stress disorder, unspecified: Secondary | ICD-10-CM

## 2021-08-16 DIAGNOSIS — K219 Gastro-esophageal reflux disease without esophagitis: Secondary | ICD-10-CM

## 2021-08-20 ENCOUNTER — Other Ambulatory Visit: Payer: Self-pay | Admitting: Neurology

## 2021-08-20 ENCOUNTER — Other Ambulatory Visit: Payer: Self-pay | Admitting: Psychiatry

## 2021-08-20 DIAGNOSIS — F4310 Post-traumatic stress disorder, unspecified: Secondary | ICD-10-CM

## 2021-08-20 DIAGNOSIS — G40309 Generalized idiopathic epilepsy and epileptic syndromes, not intractable, without status epilepticus: Secondary | ICD-10-CM

## 2021-08-20 DIAGNOSIS — F5101 Primary insomnia: Secondary | ICD-10-CM

## 2021-08-31 ENCOUNTER — Ambulatory Visit: Payer: Medicare Other | Admitting: Psychiatry

## 2021-08-31 ENCOUNTER — Other Ambulatory Visit: Payer: Self-pay

## 2021-08-31 DIAGNOSIS — F319 Bipolar disorder, unspecified: Secondary | ICD-10-CM

## 2021-08-31 NOTE — Progress Notes (Signed)
[facility] Counselor/Therapist Progress Note  Patient ID: Theresa [name], MRN: [id number],    Date: 08/31/2021  Time Spent: 48 minutes   Treatment Type: Individual Therapy  Reported Symptoms: anxiety, depression (improving), still having some "passive suicidal thoughts, and usually when I'm not around other people."   Mental Status Exam:  Appearance:   Casual     Behavior:  Appropriate, Sharing, and Motivated  Motor:  Normal  Speech/Language:   Clear and Coherent  Affect:  Depressed and anxiety  Mood:  anxious and depressed  Thought process:  goal directed  Thought content:    WNL  Sensory/Perceptual disturbances:    WNL  Orientation:  oriented to person,place,time; situation, well oriented  Attention:  Fair  Concentration:  Fair  Memory:  Some short and long term memory loss; to be tested again next week  Fund of knowledge:   Good  Insight:    Good and Fair  Judgment:   Good  Impulse Control:  Good   Risk Assessment: Danger to Self:  No Self-injurious Behavior: No Danger to Others: No Duty to Warn:no Physical Aggression / Violence:No  Access to Firearms a concern: No  Gang Involvement:No   Subjective: Patient in today reporting some anxiety and depression "but not as bad as it was". Is "anxious about some cognitive testing she is having done next week." Reports her "memory is worse". Trying to "not assume the worst as far as the results." Sleep is improved and remains good. Was able to go on trip to [place] to visit family recently and it went well "emotionally for me." States her depression and anxiety have been some more improved recently. Reports not as much obsessiveness nor too manic. States "less bipolar issues but I cycle frequently" and feels her meds are helping more. "Slow deliberate deep breathing is helpful." Continues to walk some "almost daily, takes naps as needed and it doesn't interfere with my night-time sleeping." Some "passive suicidal thoughts" but  ever acts out on them and "really don't want to harm myself." "It usually just passes. Denies that she would ever hurt herself.  Gave several examples of her "forgetting things" and "I try not to get too frustrated by it."  Does state that by being able to share her symptoms it helps her feel supported and also not blame herself "like I used to".  Interventions: Solution-Oriented/Positive Psychology and Ego-Supportive  Treatment goals: Treatment goals remain on treatment plan as patient works with strategies to achieve her goals. Progress is assessed each session and is documented in treatment note. Long term goal: Reduce overall level, frequency, and intensity of the anxiety so that daily functioning is not impaired. Short-term goal: Increase understanding of the beliefs and messages that produce anxiety, depression, and worry. Strategies: Identify , challenge, and replace anxious/negative/depressive self-talk with positive, realistic, and empowering self talk.   Diagnosis:   ICD-10-CM   1. Bipolar 1 disorder ([place])  F31.9      Plan: Patient today showing good motivation and was actively participating in session targeting more on her anxiety and worrying especially about her upcoming cognitive testing.  Patient responded well to this, demonstrating how she tries to coach herself through times when she is anxious, but later adds that because her memory is worse that influences her coping.  Is trying to stay "in the present and not worrying about things in the future" and also we talked about how remaining as calm as possible may be helpful to her  during the testing so as to yield more accurate results.  Patient seemed to appreciate that and indicated she was going work on trying not to worry as much and stay in the present.  She was very grateful that she had a good trip to see family to [place] over the holidays and reported doing quite well emotionally during the trip.  Has noted some improvement  in self confidence most recently "until I started worrying about the upcoming test".  Encouraged patient and her practice of positive behaviors including: Trying to be a person more of concern versus worrying and assuming that things are going go in a negative direction, believing more in her ability to make important changes, focusing on things that she can control, more consistent positive self talk, staying in the present and not going back to the past or jumping too far into the future, maintaining good boundaries with others, staying in touch with people who are supportive, participating in activities that bring her pleasure including being with her pets and grandchildren, getting outside daily and walking as weather permits, finding the positives within herself, healthy nutrition and exercise, saying no when she needs to say no without guilt, and feel good about the strength she shows working with goal-directed behaviors to move in a direction that supports a healthy sense of connectedness to others and overall improved emotional health.   Goal review and progress/challenges noted with patient.  Next appointment within 3 weeks.  This record has been created using [employer].  Chart creation errors have been sought, but may not always have been located and corrected.  Such creation errors do not reflect on the standard of medical care provided.  Shanon Ace, LCSW

## 2021-09-01 ENCOUNTER — Encounter: Payer: Self-pay | Admitting: Psychiatry

## 2021-09-01 ENCOUNTER — Ambulatory Visit: Payer: Medicare Other | Admitting: Psychiatry

## 2021-09-01 DIAGNOSIS — F319 Bipolar disorder, unspecified: Secondary | ICD-10-CM

## 2021-09-01 DIAGNOSIS — F4310 Post-traumatic stress disorder, unspecified: Secondary | ICD-10-CM | POA: Diagnosis not present

## 2021-09-01 MED ORDER — METFORMIN HCL 500 MG PO TABS: 500.0000 mg | ORAL_TABLET | Freq: Two times a day (BID) | ORAL | 0 refills | Status: DC

## 2021-09-01 MED ORDER — OLANZAPINE 5 MG PO TABS: 5.0000 mg | ORAL_TABLET | Freq: Every day | ORAL | 0 refills | Status: DC

## 2021-09-01 MED ORDER — METFORMIN HCL 500 MG PO TABS: ORAL_TABLET | ORAL | 1 refills | Status: DC

## 2021-09-01 MED ORDER — ALPRAZOLAM 1 MG PO TABS: ORAL_TABLET | ORAL | 2 refills | Status: DC

## 2021-09-01 NOTE — Progress Notes (Signed)
Theresa [name] [id number] 09/11/1955 66 y.o.  Subjective:   Patient ID:  Theresa [name] is a 66 y.o. (DOB 24-Feb-1956) female.  Chief Complaint:  Chief Complaint  Patient presents with   Follow-up    Bipolar D/O, Anxiety, Insomnia, and TD    HPI Theresa [name] presents to the office today for follow-up of mood disturbance, anxiety, and insomnia. Enjoyed her trip. Returned 1.5 weeks ago. Biological father is 78 and needs care. She and her half- sister stayed with him. She reports that she took "one Xanax daily so I didn't get too excited." She was able to sleep on her trip.   She noticed mild depression one day after her trip, but I am fine now but starting lean more on the manic side." Reports elevated mood. Husband reports that she is awake by 5 am and engaged in her email. She reports that she went to bed at 9 pm and slept all night. Denies impulsive or risky behavior. Husband reports that she was wanting to make [place] food after traveling to [place] and he had to suggest that she make a list. He reports that he has noticed an increased in her speed, such as "rapid fire questions." Denies racing thoughts. Energy has been ok. She reports that she has been using her stationary bike. Appetite has been great. Denies increased goal-directed activity. She reports that she is able to concentrate to read "but I forget what I have read." She reports that she will forget movies that she has seen in the past. Husband reports that she has watched a certain movie 6 times and has no memory of it. She reports that she had some suicidal thoughts about 2 nights ago without intent. Denies SI.   She will be having neuropsych testing next week. Husband reports that sometimes she does not recall what happened the previous day. She reports that she has some anxiety about the test. She reports that anxiety has been at baseline.  She reports concerns about side effects with Lybalvi. Would like to start Metformin to  help with anti-psychotic induced weight gain.   She reports difficulty standing still due to Tardive Dyskinesia. Notices some tongue movements.   Past Psychiatric Medication Trials: Olanzapine Seroquel Saphris Depakote-self-injurious behavior Lamictal Keppra Carbamazepine Xanax Klonopin Ambien Trazodone Benztropine Ingrezza Propranolol- Ineffective    AIMS    Flowsheet Row Office Visit from 08/04/2021 in [facility] Visit from 03/09/2021 in [facility] Visit from 09/30/2020 in [facility] Visit from 06/16/2020 in [facility] Visit from 04/03/2020 in [facility] Total Score 9 10 9 8 14       [place] Office Visit from 02/28/2017 in [facility] [place]  Total Score (max 30 points ) 29      [id number]    [place] Visit from 02/28/2017 in [facility] [place] Visit from 09/19/2015 in [facility] from [facility]/05/2015 [facility] 03/10/2015 in [facility] at [facility] Total Score 0 0 0 0        Review of Systems:  Review of Systems  Genitourinary: Negative.   Musculoskeletal:  Negative for gait problem.  Psychiatric/Behavioral:         Please refer to HPI   Medications: I have reviewed the patient's current medications.  Current Outpatient  Medications  Medication Sig Dispense Refill   ALPRAZolam (XANAX XR) 2 MG 24 hr tablet Take 1 tablet (2 mg total) by mouth in the morning and at bedtime. 180 tablet 0   carbamazepine (CARBATROL) 300 MG 12 hr capsule TAKE 1 CAPSULE BY MOUTH IN  THE MORNING AND 2 CAPSULES  BY MOUTH AT BEDTIME 270 capsule 3   cetirizine (ZYRTEC) 10 MG tablet Take 10 mg by mouth daily as needed.      Deutetrabenazine (AUSTEDO) 12 MG TABS Take 1 tablet po BID with a 9 mg tablet to equal 21 mg  po BID 60 tablet 5   Deutetrabenazine (AUSTEDO) 9 MG TABS Take 1 tab po BID with a 12 mg tab to equal 21 mg BID 60 tablet 5   fluticasone (FLONASE) 50 MCG/ACT nasal spray Place into both nostrils daily as needed.      levETIRAcetam (KEPPRA) 500 MG tablet Take 1 tablet (500 mg total) by mouth 2 (two) times daily. 180 tablet 3   lovastatin (MEVACOR) 20 MG tablet TAKE 1 TABLET BY MOUTH  DAILY 90 tablet 1   Melatonin 10 MG TABS Take 1 tablet by mouth at bedtime. Reported on 01/30/2016     Multiple Vitamins-Minerals (CENTRUM SILVER PO) Take by mouth.     pantoprazole (PROTONIX) 40 MG tablet Take 1 tablet (40 mg total) by mouth daily as needed. 90 tablet 1   traZODone (DESYREL) 100 MG tablet Take 1-2 tablets (100-200 mg total) by mouth at bedtime as needed. for sleep 180 tablet 3   zolpidem (AMBIEN) 10 MG tablet TAKE 1 TABLET BY MOUTH AT  BEDTIME AS NEEDED 30 tablet 5   [START ON 09/15/2021] ALPRAZolam (XANAX) 1 MG tablet TAKE 1 TABLET BY MOUTH  DAILY AS NEEDED FOR SEVERE  ANXIETY 30 tablet 2   clonazePAM (KLONOPIN) 1 MG tablet Take 1 tablet (1 mg total) by mouth at bedtime. 30 tablet 2   COVID-19 mRNA bivalent vaccine, Pfizer, injection Inject into the muscle. 0.3 mL 0   COVID-19 mRNA vaccine, Moderna, (MODERNA COVID-19 VACCINE) 100 MCG/0.5ML injection Inject into the muscle. 0.25 mL 0   metFORMIN (GLUCOPHAGE) 500 MG tablet Take 1 tablet (500 mg total) by mouth 2 (two) times daily with a meal. 180 tablet 0   nitrofurantoin, macrocrystal-monohydrate, (MACROBID) 100 MG capsule Take 1 capsule (100 mg total) by mouth 2 (two) times daily. (Patient not taking: Reported on 09/01/2021) 14 capsule 0   OLANZapine (ZYPREXA) 5 MG tablet Take 1 tablet (5 mg total) by mouth at bedtime. 90 tablet 0   polyethylene glycol (MIRALAX / GLYCOLAX) 17 g packet Take 17 g by mouth daily as needed.     No current facility-administered medications for this visit.    Medication Side Effects: Other: TD, possible cognitive side  effects, weight gain  Allergies:  Allergies  Allergen Reactions   Lamictal [Lamotrigine]     Acute renal failure   Ropinirole Nausea And Vomiting   Tramadol    Codeine Swelling and Rash    Can take hydrocodone   Penicillins     Abdominal pain    Past Medical History:  Diagnosis Date   Adenomatous colon polyp    Allergy    Anxiety    Bipolar 1 disorder ([place])    Blood transfusion without reported diagnosis    Depression    Elevated LFTs    Hepatic steatosis 05/07/13   Hyperlipidemia    Seizures (HCC)    Tardive dyskinesia  Past Medical History, Surgical history, Social history, and Family history were reviewed and updated as appropriate.   Please see review of systems for further details on the patient's review from today.   Objective:   Physical Exam:  Wt 180 lb (81.6 kg)    BMI 29.05 kg/m   Physical Exam Constitutional:      General: She is not in acute distress. Musculoskeletal:        General: No deformity.  Neurological:     Mental Status: She is alert and oriented to person, place, and time.     Coordination: Coordination normal.  Psychiatric:        Attention and Perception: Attention and perception normal. She does not perceive auditory or visual hallucinations.        Mood and Affect: Mood normal. Mood is not anxious or depressed. Affect is not labile, blunt, angry or inappropriate.        Speech: Speech normal.        Behavior: Behavior normal.        Thought Content: Thought content normal. Thought content is not paranoid or delusional. Thought content does not include homicidal or suicidal ideation. Thought content does not include homicidal or suicidal plan.        Cognition and Memory: Cognition normal. She exhibits impaired recent memory.        Judgment: Judgment normal.     Comments: Insight intact    Lab Review:     Component Value Date/Time   NA 135 12/04/2020 1107   K 4.8 12/04/2020 1107   CL 97 12/04/2020 1107   CO2 30 12/04/2020  1107   GLUCOSE 84 12/04/2020 1107   BUN 10 12/04/2020 1107   CREATININE 0.82 12/04/2020 1107   CREATININE 0.79 05/08/2020 1015   CALCIUM 9.1 12/04/2020 1107   PROT 6.8 12/04/2020 1107   ALBUMIN 4.3 12/04/2020 1107   AST 29 12/04/2020 1107   ALT 38 (H) 12/04/2020 1107   ALKPHOS 109 12/04/2020 1107   BILITOT 0.4 12/04/2020 1107       Component Value Date/Time   WBC 3.9 (L) 05/14/2021 0958   RBC 4.75 05/14/2021 0958   HGB 13.7 05/14/2021 0958   HCT 41.2 05/14/2021 0958   PLT 200.0 05/14/2021 0958   MCV 86.9 05/14/2021 0958   MCV 87.7 02/07/2013 1110   MCH 29.8 05/08/2020 1015   MCHC 33.2 05/14/2021 0958   RDW 13.5 05/14/2021 0958    No results found for: POCLITH, LITHIUM   Lab Results  Component Value Date   CBMZ 5.2 02/01/2019     .res Assessment: Plan:    Pt seen for 30 minutes and time spent reviewing treatment options discussed during last visit. She reports that she is not comfortable switching to Lybalvi and prefers to continue Olanzapine. She reports that she is interested in starting Metformin to help with antipsychotic induced weight gain. Will start Metformin 500 mg daily with food for one week, then increase to 500 mg twice daily. Discussed upcoming neuropsychological re-testing and she reports that benzodiazepine reductions were recommended at time of last exam. She reports that she is reluctant to make medication changes at this time "because I am doing so well" and that anxiety has been significantly improved since starting Xanax XR. She prefers to not make any reductions until after neuropsychological re-testing. Discussed very gradual dose reductions if attempting to reduce benzodiazepines in the future and pt agrees with this.  Continue Olanzapine 5 mg po QHS for Bipolar  D/O. Continue Xanax XR 2 mg po BID for anxiety.  Continue Xanax 1 mg as needed for severe anxiety.  Continue Klonopin 1 mg at bedtime for insomnia.  Continue [place] 21 mg po BID for  TD. Continue Trazodone 100-200 mg po QHS prn insomnia.  Continue Ambien 10 mg po QHS prn insomnia.  Pt to follow-up in 6-8 weeks or sooner if clinically indicated.  Patient advised to contact office with any questions, adverse effects, or acute worsening in signs and symptoms. Recommend continuing therapy with Rinaldo Cloud, LCSW.    Theresa [name] was seen today for follow-up.  Diagnoses and all orders for this visit:  Bipolar 1 disorder ([place]) -     OLANZapine (ZYPREXA) 5 MG tablet; Take 1 tablet (5 mg total) by mouth at bedtime.  Posttraumatic stress disorder -     ALPRAZolam (XANAX) 1 MG tablet; TAKE 1 TABLET BY MOUTH  DAILY AS NEEDED FOR SEVERE  ANXIETY  Other orders -     Discontinue: metFORMIN (GLUCOPHAGE) 500 MG tablet; Take 1 tablet daily for one week, then increase to 1 tablet twice daily -     metFORMIN (GLUCOPHAGE) 500 MG tablet; Take 1 tablet (500 mg total) by mouth 2 (two) times daily with a meal.     Please see After Visit Summary for patient specific instructions.  Future Appointments  Date Time Provider [place]  09/28/2021 11:00 AM Shanon Ace, LCSW [facility] None  09/29/2021  3:30 PM Cameron Sprang, MD [facility] None  10/13/2021 10:15 AM [name], PMHNP [facility] None  10/26/2021 11:00 AM Shanon Ace, LCSW [facility] None  11/16/2021 11:00 AM Shanon Ace, LCSW [facility] None  12/07/2021 11:00 AM Shanon Ace, LCSW [facility] None  01/04/2022 11:00 AM Shanon Ace, LCSW [facility] None    No orders of the defined types were placed in this encounter.   -------------------------------

## 2021-09-10 ENCOUNTER — Telehealth: Payer: Self-pay | Admitting: Psychiatry

## 2021-09-10 NOTE — Telephone Encounter (Signed)
Pt LVM reporting started metformin was causing diarrhea. She had to stop taking prior to test. Asking if she should restart it? Contact @ [phone]

## 2021-09-10 NOTE — Telephone Encounter (Signed)
Recommendations were given to the patient.  

## 2021-09-10 NOTE — Telephone Encounter (Signed)
She can try it again if she would like and stop it if the diarrhea does not improve. Please let her know that some people are not able to tolerate it because the diarrhea does not improve. She may also want to keep it once a day if the diarrhea got worse when she increased it to twice a day. Please remind her to take it with food.

## 2021-09-10 NOTE — Telephone Encounter (Signed)
See phone message. I called patient and she said she had a neurocognitive test on Monday and she stopped taking the Metformin due to diarrhea. She said she didn't take it Monday or Tuesday and the diarrhea lasted for 2 days, but was ok yesterday. She asked if her body would eventually get used to the medication and she wouldn't have diarrhea. She said she could tolerate the medication if that is the case, as long as she doesn't have an outside appt.

## 2021-09-14 ENCOUNTER — Ambulatory Visit: Payer: Medicare Other | Admitting: Psychology

## 2021-09-28 ENCOUNTER — Other Ambulatory Visit: Payer: Self-pay

## 2021-09-28 ENCOUNTER — Ambulatory Visit: Payer: Medicare Other | Admitting: Psychiatry

## 2021-09-28 DIAGNOSIS — F319 Bipolar disorder, unspecified: Secondary | ICD-10-CM | POA: Diagnosis not present

## 2021-09-28 NOTE — Progress Notes (Signed)
[facility] Counselor/Therapist Progress Note  Patient ID: Theresa [name], MRN: [id number],    Date: 09/28/2021  Time Spent: 50 minutes  Treatment Type: Individual Therapy  Reported Symptoms:  anxiety "slowly getting better", depression more recently decreased, reports some tardive dyskinesia and takes her med for that in addition to stretching exercises  Mental Status Exam:  Appearance:   Casual     Behavior:  Appropriate, Sharing, and Motivated (but not as motivated as previously)  Motor:  Normal  Speech/Language:   Clear and Coherent  Affect:  Some  anxiousness and sometimes flat today  Mood:  anxious and and some excitement about a new art she is exploring ("cold, wax, oil painting)  Thought process:  goal directed  Thought content:    Obsessions and overthinking  Sensory/Perceptual disturbances:    WNL  Orientation:  oriented to person, place, time/date, situation, day of week, month of year, year, and stated date of Feb. 6, 2023  Attention:  Fair  Concentration:  Fair  Memory:  Some short and long term memory concerns and had recent testing  Fund of knowledge:   Good  Insight:    Good and Fair  Judgment:   Good  Impulse Control:  Fair   Risk Assessment: Danger to Self:  No Self-injurious Behavior: No Danger to Others: No Duty to Warn:no Physical Aggression / Violence:No  Access to Firearms a concern: No  Gang Involvement:No   Subjective: Patient in today reporting some anxiety "that is slowly getting better", depression more recently decreases, and some concerns about her tardive dyskinesia for which she takes her med and uses stretching exercises. Mostly focused to day on her recent "cognitive testing" with Dr. Vikki Ports. She came to session with a copy of the evaluation. Wanted to review and talk about in session today her thoughts/concerns/feelings about test results. States the report itself is better than she anticipated. Feels her mood is more manic; is calm  here in office today. Shares that "I can be better with others when I'm manic than when I'm with my husband. "I have pretty good control of my mania, but it does come out more with my husband." "I used to get real euphoric but not anymore, I stay more calm."  Notices that she gets "over-focused on anything new" and has recently learned about a type of painting she plans to try so "have been obsessed with it". Gave several examples of other obsessiveness and sees it as an unhelpful distraction. "But I like art and want to try this new type and notice my being obsessive about it." Staying on meds as prescribed and feeling "pretty good for now" but still some low level mania according to what patient says and is a little more talkative but not excessive.  States she has felt like this before and has this afternoon to herself which will be good for her as her husband has his own doctor's appointment, and I do better actually with some time on my own.  Also shares that she knows that she is okay when she can sleep but that if she ever misses 3 nights of sleep, "I need to report any mania and having".  Continues her daily walking and sometimes takes a nap if she feels like it would not affect her sleeping at night.  Denies any suicidal thoughts today "not even any passive ones".  Feeling relieved that her testing was not as bad as she anticipated and looking forward to her  new venture with the new type of painting she wants to get involved with.  Interventions: Solution-Oriented/Positive Psychology and Ego-Supportive  Treatment goals: Treatment goals remain on treatment plan as patient works with strategies to achieve her goals. Progress is assessed each session and is documented in treatment note. Long term goal: Reduce overall level, frequency, and intensity of the anxiety so that daily functioning is not impaired. Short-term goal: Increase understanding of the beliefs and messages that produce anxiety,  depression, and worry. Strategies: Identify , challenge, and replace anxious/negative/depressive self-talk with positive, realistic, and empowering self talk.  Diagnosis:   ICD-10-CM   1. Bipolar 1 disorder ([place])  F31.9      Plan:  Patient showing some motivation, a bit lower than previously but was very engaged in session today as she focused more on her recent experience of being tested cognitively and the results which she brought in with her.  She was thankful that "I am in a mild range" and thought that she would be in a higher range so getting the testing done seem to dispel most of the fears she had about that.  Did state today that she was more on the manic side although did not exhibit many symptoms in session, which she relates that is usually the way it is for her and that her symptoms come out more with her husband.  Is not sleeping as well but shared that she calls med provider if she misses 3 nights of sleep and that has not happened any time recently.  Excited about a new type of paining she heard about and is eager to try it out.  States that she is still trying to "stay in the present and not worry as much about the future" as this helps her remain calm.  Is staying on her meds as prescribed.  Encouraged her in her practice of positive behaviors including: Recognizing any progress and reflecting on it more often, for any negative thoughts create 2 positives, try to be a person more of concern versus worrying and assuming that things are going in a negative direction, believing more in her ability to make important changes, focusing on things that she can control, more consistent positive self talk, staying in the present and not going back to the past nor jumping too far into the future, maintaining good boundaries with others, staying in touch with people who are supportive, participating in activities that bring her pleasure including being with her pets and grandchildren, getting outside  daily and walking as weather permits, finding the positives within herself and being able to name them, healthy nutrition and exercise, saying no when she needs to say no without guilt, and recognize the strengths she shows working with goal-directed behaviors to move in a direction that supports improved emotional health.  Goal review and progress/challenges noted with patient.  Next appointment within 3 weeks.  This record has been created using [employer].  Chart creation errors have been sought, but may not always have been located and corrected.  Such creation errors do not reflect on the standard of medical care provided.   Shanon Ace, LCSW

## 2021-09-29 ENCOUNTER — Encounter: Payer: Self-pay | Admitting: Neurology

## 2021-09-29 ENCOUNTER — Ambulatory Visit: Payer: Medicare Other | Admitting: Neurology

## 2021-09-29 DIAGNOSIS — G40309 Generalized idiopathic epilepsy and epileptic syndromes, not intractable, without status epilepticus: Secondary | ICD-10-CM

## 2021-09-29 MED ORDER — LEVETIRACETAM 500 MG PO TABS: 500.0000 mg | ORAL_TABLET | Freq: Two times a day (BID) | ORAL | 3 refills | Status: DC

## 2021-09-29 NOTE — Patient Instructions (Signed)
Good to see you doing well. Continue all your medications. Follow-up in 1 year, call for any changes.   Seizure Precautions: 1. If medication has been prescribed for you to prevent seizures, take it exactly as directed.  Do not stop taking the medicine without talking to your doctor first, even if you have not had a seizure in a long time.   2. Avoid activities in which a seizure would cause danger to yourself or to others.  Don't operate dangerous machinery, swim alone, or climb in high or dangerous places, such as on ladders, roofs, or girders.  Do not drive unless your doctor says you may.  3. If you have any warning that you may have a seizure, lay down in a safe place where you can't hurt yourself.    4.  No driving for 6 months from last seizure, as per [facility].   Please refer to the following link on the [facility] website for more information: http://[email or web address]/[email or web address]/[email or web address]   5.  Maintain good sleep hygiene. Avoid alcohol.  6.  Contact your doctor if you have any problems that may be related to the medicine you are taking.  7.  Call 911 and bring the patient back to the ED if:        A.  The seizure lasts longer than 5 minutes.       B.  The patient doesn't awaken shortly after the seizure  C.  The patient has new problems such as difficulty seeing, speaking or moving  D.  The patient was injured during the seizure  E.  The patient has a temperature over 102 F (39C)  F.  The patient vomited and now is having trouble breathing
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
## 2021-10-10 ENCOUNTER — Other Ambulatory Visit: Payer: Self-pay | Admitting: Psychiatry

## 2021-10-10 DIAGNOSIS — F5101 Primary insomnia: Secondary | ICD-10-CM

## 2021-10-10 DIAGNOSIS — F4310 Post-traumatic stress disorder, unspecified: Secondary | ICD-10-CM

## 2021-10-12 NOTE — Telephone Encounter (Signed)
Filled 12/27 and 12/22 appt on 10/13/21

## 2021-10-13 ENCOUNTER — Encounter: Payer: Self-pay | Admitting: Psychiatry

## 2021-10-13 ENCOUNTER — Other Ambulatory Visit: Payer: Self-pay

## 2021-10-13 ENCOUNTER — Ambulatory Visit: Payer: Medicare Other | Admitting: Psychiatry

## 2021-10-13 VITALS — Wt 180.0 lb

## 2021-10-13 DIAGNOSIS — F4310 Post-traumatic stress disorder, unspecified: Secondary | ICD-10-CM | POA: Diagnosis not present

## 2021-10-13 DIAGNOSIS — F319 Bipolar disorder, unspecified: Secondary | ICD-10-CM

## 2021-10-13 DIAGNOSIS — F5101 Primary insomnia: Secondary | ICD-10-CM | POA: Diagnosis not present

## 2021-10-13 MED ORDER — OLANZAPINE 5 MG PO TABS: 5.0000 mg | ORAL_TABLET | Freq: Every day | ORAL | 1 refills | Status: DC

## 2021-10-13 NOTE — Progress Notes (Signed)
Theresa [name] [id number] 11-26-1955 66 y.o.  Subjective:   Patient ID:  Theresa [name] is a 66 y.o. (DOB 10/24/1955) female.  Chief Complaint:  Chief Complaint  Patient presents with   Follow-up    Anxiety, mood disturbance, insomnia    HPI Theresa [name] presents to the office today for follow-up of mood disturbance, anxiety, and insomnia. She reports that she had psychological testing and is "relieved" to learn that there has not been a significant decline in her cognition.   She reports that she had diarrhea initially after starting Metformin and then this resolved. She reports that she is having occ diarrhea. She reports that she has noticed her appetite is slightly less. She reports that she does not eat large amounts of food. She reports that she has always craved sweets.   She reports that she has had some hypomania. She reports that she has been going to bed early and sleeping about 10 hours a night. Husband reports that her sleep has improved with changing the time of Pantoprazole to later in the day. Denies impulsive or risky behavior. She reports that she has started in a new medium with her art. Energy has been "normal." Motivation has been ok. Concentration "on certain things has been good." She reports that she interrupts others at times because she is afraid she will forget to say something.   She reports chronic vague suicidal thoughts- "I just think about it once and awhile." Denies suicidal intent or plan. Denies any self-injurious behavior in the last 10 years.   She is getting her turtle back that she had at one time. She now has 4 turtles. She keeps them in a warm, humid room.   She reports improved TD. Notices some tongue movements. She notices some increased restlessness when she walks her dogs. She reports that Austedo has been more effective for TD than Ingrezza.   Past Psychiatric Medication Trials: Olanzapine Seroquel Saphris Depakote-self-injurious  behavior Lamictal Keppra Carbamazepine Xanax Klonopin Ambien Trazodone Benztropine Ingrezza Propranolol- Ineffective  AIMS    Flowsheet Row Office Visit from 10/13/2021 in [place] Visit from 08/04/2021 in [place] Visit from 03/09/2021 in [place] Visit from 09/30/2020 in [place] Visit from 06/16/2020 in [place] Total Score 6 9 10 9 8       [place] Office Visit from 02/28/2017 in [place] at [facility]  Total Score (max 30 points ) 29      PHQ2-9    [place] Visit from 02/28/2017 in [place] at [place] Visit from 09/19/2015 in [facility] at [place] from 06/05/2015 in [place] at [place] from 03/10/2015 in Primary Care at [facility] Total Score 0 0 0 0        Review of Systems:  Review of Systems  Musculoskeletal:  Negative for gait problem.  Neurological:  Negative for seizures.       Improved tremors  Psychiatric/Behavioral:         Please refer to HPI   Medications: I have reviewed the patient's current medications.  Current Outpatient Medications  Medication Sig Dispense Refill   pantoprazole (PROTONIX) 40 MG tablet Take 1 tablet (40 mg total) by mouth daily as needed. 90 tablet 1   ALPRAZolam (XANAX XR) 2 MG 24 hr tablet TAKE 1 TABLET BY MOUTH  IN THE  MORNING AND AT BEDTIME 180 tablet 1   ALPRAZolam (XANAX) 1 MG tablet TAKE 1 TABLET BY MOUTH  DAILY AS NEEDED FOR SEVERE  ANXIETY 30 tablet 2   carbamazepine (CARBATROL) 300 MG 12 hr capsule TAKE 1 CAPSULE BY MOUTH IN  THE MORNING AND 2 CAPSULES  BY MOUTH AT BEDTIME 270 capsule 3   cetirizine (ZYRTEC) 10 MG tablet Take 10 mg by mouth daily as needed.      clonazePAM (KLONOPIN) 1 MG tablet TAKE 1 TABLET BY MOUTH AT  BEDTIME 30 tablet 5   COVID-19 mRNA  bivalent vaccine, Pfizer, injection Inject into the muscle. 0.3 mL 0   COVID-19 mRNA vaccine, Moderna, (MODERNA COVID-19 VACCINE) 100 MCG/0.5ML injection Inject into the muscle. 0.25 mL 0   Deutetrabenazine (AUSTEDO) 12 MG TABS Take 1 tablet po BID with a 9 mg tablet to equal 21 mg po BID 60 tablet 5   Deutetrabenazine (AUSTEDO) 9 MG TABS Take 1 tab po BID with a 12 mg tab to equal 21 mg BID 60 tablet 5   fluticasone (FLONASE) 50 MCG/ACT nasal spray Place into both nostrils daily as needed.      levETIRAcetam (KEPPRA) 500 MG tablet Take 1 tablet (500 mg total) by mouth 2 (two) times daily. 180 tablet 3   lovastatin (MEVACOR) 20 MG tablet TAKE 1 TABLET BY MOUTH  DAILY 90 tablet 1   Melatonin 10 MG TABS Take 1 tablet by mouth at bedtime. Reported on 01/30/2016     metFORMIN (GLUCOPHAGE) 500 MG tablet TAKE 1 TABLET BY MOUTH TWICE  DAILY WITH A MEAL 180 tablet 3   Multiple Vitamins-Minerals (CENTRUM SILVER PO) Take by mouth.     nitrofurantoin, macrocrystal-monohydrate, (MACROBID) 100 MG capsule Take 1 capsule (100 mg total) by mouth 2 (two) times daily. (Patient not taking: Reported on 09/01/2021) 14 capsule 0   OLANZapine (ZYPREXA) 5 MG tablet Take 1 tablet (5 mg total) by mouth at bedtime. 90 tablet 1   polyethylene glycol (MIRALAX / GLYCOLAX) 17 g packet Take 17 g by mouth daily as needed.     traZODone (DESYREL) 100 MG tablet Take 1-2 tablets (100-200 mg total) by mouth at bedtime as needed. for sleep 180 tablet 3   zolpidem (AMBIEN) 10 MG tablet TAKE 1 TABLET BY MOUTH AT  BEDTIME AS NEEDED 30 tablet 5   No current facility-administered medications for this visit.    Medication Side Effects: Other: Possible cognitive side effects  Allergies:  Allergies  Allergen Reactions   Lamictal [Lamotrigine]     Acute renal failure   Ropinirole Nausea And Vomiting   Tramadol    Codeine Swelling and Rash    Can take hydrocodone   Penicillins     Abdominal pain    Past Medical History:  Diagnosis  Date   Adenomatous colon polyp    Allergy    Anxiety    Bipolar 1 disorder ([place])    Blood transfusion without reported diagnosis    Depression    Elevated LFTs    Hepatic steatosis 05/07/13   Hyperlipidemia    Seizures (HCC)    Tardive dyskinesia     Past Medical History, Surgical history, Social history, and Family history were reviewed and updated as appropriate.   Please see review of systems for further details on the patient's review from today.   Objective:   Physical Exam:  Wt 180 lb (81.6 kg)    BMI 29.05 kg/m   Physical Exam Constitutional:  General: She is not in acute distress. Musculoskeletal:        General: No deformity.  Neurological:     Mental Status: She is alert and oriented to person, place, and time.     Coordination: Coordination normal.  Psychiatric:        Attention and Perception: Attention and perception normal. She does not perceive auditory or visual hallucinations.        Mood and Affect: Mood normal. Mood is not anxious or depressed. Affect is not labile, blunt, angry or inappropriate.        Speech: Speech normal.        Behavior: Behavior normal.        Thought Content: Thought content normal. Thought content is not paranoid or delusional. Thought content does not include homicidal or suicidal ideation. Thought content does not include homicidal or suicidal plan.        Cognition and Memory: Cognition and memory normal.        Judgment: Judgment normal.     Comments: Insight intact    Lab Review:     Component Value Date/Time   NA 135 12/04/2020 1107   K 4.8 12/04/2020 1107   CL 97 12/04/2020 1107   CO2 30 12/04/2020 1107   GLUCOSE 84 12/04/2020 1107   BUN 10 12/04/2020 1107   CREATININE 0.82 12/04/2020 1107   CREATININE 0.79 05/08/2020 1015   CALCIUM 9.1 12/04/2020 1107   PROT 6.8 12/04/2020 1107   ALBUMIN 4.3 12/04/2020 1107   AST 29 12/04/2020 1107   ALT 38 (H) 12/04/2020 1107   ALKPHOS 109 12/04/2020 1107   BILITOT 0.4  12/04/2020 1107       Component Value Date/Time   WBC 3.9 (L) 05/14/2021 0958   RBC 4.75 05/14/2021 0958   HGB 13.7 05/14/2021 0958   HCT 41.2 05/14/2021 0958   PLT 200.0 05/14/2021 0958   MCV 86.9 05/14/2021 0958   MCV 87.7 02/07/2013 1110   MCH 29.8 05/08/2020 1015   MCHC 33.2 05/14/2021 0958   RDW 13.5 05/14/2021 0958    No results found for: POCLITH, LITHIUM   Lab Results  Component Value Date   CBMZ 5.2 02/01/2019     .res Assessment: Plan:   Pt seen for 30 minutes and time spent discussing recent neuropsychiatric testing results. Pt reports that benefits of medication continue to outweigh possible side effects. Discussed that previous attempts to reduce Olanzapine and Carbamazepine have resulted in mania and sleeplessness. Discussed considering dose reduction of Klonopin in the future. Discussed not reducing Klonopin at this time since she and her husband report that spring has historically been a time when she is more likely to experience manic s/s.  Will continue current medications without changes at this time.  Recommend continuing therapy with Rinaldo Cloud, LCSW.  Pt to follow-up with this provider in 4-6 weeks or sooner if clinically indicated.  Patient advised to contact office with any questions, adverse effects, or acute worsening in signs and symptoms.  Theresa [name] was seen today for follow-up.  Diagnoses and all orders for this visit:  Bipolar 1 disorder ([place]) -     OLANZapine (ZYPREXA) 5 MG tablet; Take 1 tablet (5 mg total) by mouth at bedtime.  Posttraumatic stress disorder  Primary insomnia     Please see After Visit Summary for patient specific instructions.  Future Appointments  Date Time Provider [place]  10/26/2021 11:00 AM Shanon Ace, LCSW CP[facility] None  11/16/2021 11:00 AM Shanon Ace, LCSW  [facility] None  11/17/2021 10:00 AM [name], PMHNP CP-CP None  12/07/2021 11:00 AM Shanon Ace, LCSW CP[facility] None  01/04/2022 11:00 AM Shanon Ace, LCSW CP-CP None  09/30/2022 10:00 AM Cameron Sprang, MD [facility] None    No orders of the defined types were placed in this encounter.   -------------------------------

## 2021-10-26 ENCOUNTER — Ambulatory Visit (INDEPENDENT_AMBULATORY_CARE_PROVIDER_SITE_OTHER): Payer: Medicare Other | Admitting: Psychiatry

## 2021-10-26 ENCOUNTER — Other Ambulatory Visit: Payer: Self-pay

## 2021-10-26 DIAGNOSIS — F319 Bipolar disorder, unspecified: Secondary | ICD-10-CM | POA: Diagnosis not present

## 2021-10-26 NOTE — Progress Notes (Signed)
?    [facility] Counselor/Therapist Progress Note ? ?Patient ID: Theresa [name], MRN: [id number],   ? ?Date: 10/26/2021 ? ?Time Spent: 50 minutes  ? ?Treatment Type: Individual Therapy ? ?Reported Symptoms: some decreased sleep but better last night, some "hypermania which this time of year it can happen for me," anxious and feels it's related to my bipolar ? ?Mental Status Exam: ? ?Appearance:   Casual     ?Behavior:  Appropriate, Sharing, and Motivated  ?Motor:  Normal  ?Speech/Language:   Clear and Coherent  ?Affect:  Appropriate  ?Mood:  "Some anxiety"  ?Thought process:  goal directed  ?Thought content:    Some overthinking  ?Sensory/Perceptual disturbances:    WNL  ?Orientation:  oriented to person, place, time/date, situation, day of week, month of year, year, and stated date of October 26, 2021  ?Attention:  Fair  ?Concentration:  Fair  ?Memory:  "Yes, short term memory, I forget, I read something one day and forget it the next."  ?Fund of knowledge:   Fair  ?Insight:    Fair  ?Judgment:   Good  ?Impulse Control:  Good  ? ?Risk Assessment: ?Danger to Self:  No ?Self-injurious Behavior: No ?Danger to Others: No ?Duty to Warn:no ?Physical Aggression / Violence:No  ?Access to Firearms a concern: No  ?Gang Involvement:No  ? ?Subjective: Patient in today reporting "I've been not fully manic but some as I didn't sleep well 2 nights but did sleep last night which is good." States that  she "used to to get euphoric but not so much now as she feels she remains more calm. She has more sleep issues this week, will contact her med provider as that is how her mania typically gets triggered. "One good thing is I'm painting again " which she enjoys. Has remained on meds as prescribed. Notices "when I settle down some I notice more calmness and taking deep breaths help.States she feels calmer near session end, and her speech and behavior are more calm.  Knows that she can call our office and speak with her med provider in  case she should become more manic.  As she leaves today she is calm and in good spirits. ? ?Interventions: Cognitive Behavioral Therapy and Solution-Oriented/Positive Psychology ?  ?Treatment goals: ?Treatment goals remain on treatment plan as patient works with strategies to achieve her goals. Progress is assessed each session and is documented in treatment note. ?Long term goal: ?Reduce overall level, frequency, and intensity of the anxiety so that daily functioning is not impaired. ?Short-term goal: ?Increase understanding of the beliefs and messages that produce anxiety, depression, and worry. ?Strategies: ?Identify , challenge, and replace anxious/negative/depressive self-talk with positive, realistic, and empowering self talk. ?  ? ?Diagnosis: ?  ICD-10-CM   ?1. Bipolar 1 disorder ([place])  F31.9   ?  ? ?Plan: Patient today showing good motivation and engagement in session as she shared her recent concerns in the past 2 days of seeming "a little manic" and did not sleep well for 2 nights but then slept "very well last night".  She reported feeling a little manic at the beginning of session but the more she talked and shared her feelings, focusing on calming herself, she stated that she was feeling better than she was earlier today.  She does notice that when she "grounds herself" by sitting still and if the food is shaking she sleeps it under one of her legs and it stops and helps her to feel calmer.  Also feels her painting is not only an enjoyable activity but it also helps keep her calm.  Does know that she can call her med provider if needed but does not feel it is necessary at this point. Encouraged patient in her practice of positive behaviors including: Recognizing any progress and reflecting on it daily, for any negative thoughts create 2 positives, try to be a person more of concern versus worrying and assuming that things are going in a negative direction, believing more in her ability to make important  changes, focusing on things that she can control, more consistent positive self talk, staying in the present and not going back to the past nor jumping too far into the future, maintaining good boundaries with others, staying in touch with people who are supportive, participating in activities that bring her pleasure including being with her pets and grandchildren, getting outside daily and walking as weather permits, finding the positives within herself and being able to name them, healthy nutrition and exercise, saying no when she needs to say no without guilt, and feel good about the strength she shows working with goal-directed behaviors to move in a direction that supports her improved emotional health. ? ?Goal review and progress/challenges noted with patient. ? ?Next appointment within 3 to 4 weeks. ? ?This record has been created using [employer].  Chart creation errors have been sought, but may not always have been located and corrected.  Such creation errors do not reflect on the standard of medical care provided. ? ? ?Shanon Ace, LCSW ? ? ? ? ? ? ? ? ? ? ? ? ? ? ? ? ? ? ?

## 2021-10-27 ENCOUNTER — Telehealth: Payer: Self-pay | Admitting: Psychiatry

## 2021-10-27 NOTE — Telephone Encounter (Signed)
Pt LVM  before 8:00 am today. Not able to sleep. May need some med changes. Apt 3/28   ?

## 2021-10-27 NOTE — Telephone Encounter (Signed)
Pt satted she can't sleep due to having manic symptoms and you told her to call if it last more then 3 days.She is taking all her sleep meds and is taking 2 trazodone.She stated when this happened before you increased olanzapine.She is currently taking 1 '5mg'$  tab but has 7.5 mg and 10 mg as well

## 2021-10-27 NOTE — Telephone Encounter (Signed)
Please advise her to take 10 mg tonight and to call tomorrow with an update. Recommend taking the 10 mg instead of the 7.5 mg tonight to try to stabilize manic s/s and help sleep as soon as possible. May be able to then reduce to 7.5 mg as symptoms improve.  ?

## 2021-10-27 NOTE — Telephone Encounter (Signed)
Pt informed

## 2021-11-08 ENCOUNTER — Encounter: Payer: Self-pay | Admitting: Family Medicine

## 2021-11-16 ENCOUNTER — Other Ambulatory Visit: Payer: Self-pay

## 2021-11-16 ENCOUNTER — Ambulatory Visit: Payer: Medicare Other | Admitting: Psychiatry

## 2021-11-16 DIAGNOSIS — F319 Bipolar disorder, unspecified: Secondary | ICD-10-CM | POA: Diagnosis not present

## 2021-11-16 NOTE — Progress Notes (Signed)
?    [facility] Counselor/Therapist Progress Note ? ?Patient ID: Theresa [name], MRN: [id number],   ? ?Date: 11/16/2021 ? ?Time Spent: 50 minutes  ? ?Treatment Type: Individual Therapy ? ?Reported Symptoms: anxiety, stressed, some recent sleep issues but last night was much better, "no depression" ? ?Mental Status Exam: ? ?Appearance:   Casual     ?Behavior:  Appropriate, Sharing, and Motivated  ?Motor:  Normal  ?Speech/Language:   Clear and Coherent  ?Affect:  Anxiety, stressed  ?Mood:  anxious and some fluctuating  ?Thought process:  goal directed  ?Thought content:    Rumination  ?Sensory/Perceptual disturbances:    WNL  ?Orientation:  oriented to person, place, time/date, situation, day of week, month of year, year, and stated date of November 16, 2021  ?Attention:  Fair  ?Concentration:  Fair  ?Memory:  "I always have some memory issues, I forget so quickly but "long and short term memory"  ?Fund of knowledge:   Good and Fair  ?Insight:    Good and Fair  ?Judgment:   Good and Fair  ?Impulse Control:  Good  ? ?Risk Assessment: ?Danger to Self:  No ?Self-injurious Behavior: No ?Danger to Others: No ?Duty to Warn:no ?Physical Aggression / Violence:No  ?Access to Firearms a concern: No  ?Gang Involvement:No  ? ?Subjective:  Patient today in office reporting anxiety and stressed by: " my painting as it is affected by my tardive dyskinesia and that stresses me out to not be able to do things I used to be able to do." "Still frustrated that my mood goes up and down."  To be seen for her meds tomorrow. Sleep has been good and bad, "but last night was really good." "Frustrated that bipolar can always be a problem when it goes up and down." Talking through the challenges she notices "when I get more manic." Some sleep irregularity until last night when she slept much better. But also noticed her thinking "off and on a few weeks about a friend that ended his own life about 25 yrs ago and patient reflected on a conversation  she had with friend's mother and wished she had asked more question."  States she is working on "letting go"  of that and actually discussed some of her strengths versus weaknesses. 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 "Feeling medium calmness at the moment", but calmer at the beginning "before talking".  Feeling some better than last session and hoping to keep moving in a better direction. ? ?Interventions: Solution-Oriented/Positive Psychology and Insight-Oriented ? ?Treatment goals: ?Treatment goals remain on treatment plan as patient works with strategies to achieve her goals. Progress is assessed each session and is documented in treatment note. ?Long term goal: ?Reduce overall level, frequency, and intensity of the anxiety so that daily functioning is not impaired. ?Short-term goal: ?Increase understanding of the beliefs and messages that produce anxiety, depression, and worry. ?Strategies: ?Identify , challenge, and replace anxious/negative/depressive self-talk with positive, realistic, and empowering self talk. ? ?Diagnosis: ?  ICD-10-CM   ?1. Bipolar 1 disorder ([place])  F31.9   ?  ? ?Plan: Patient today showing motivation and active participation in session sharing some anxiety and stress related to her tardive dyskinesia and how it affects her painting due to making her hands shake.  Does acknowledge "that it  is not as bad as it was" and has hope for it to continue to get better.  Admits to being "easily frustrated with it" and wants to bring in pictures of a couple of her paintings that she has done recently as she feels it relates to her journey and working on issues in therapy previously and currently.  Sleep was much better last night, but has not been consistently that way.  Is hoping that maybe it will be going in a positive  direction.  Sees her med provider tomorrow and plans to ask questions in reference to her tardive dyskinesia and hoping to get more consistent better sleep.  Smiles more often today and is in pretty good spirits but also acknowledging as noted above that it is hard to be patient with the tardive dyskinesia. Encouraged patient in her practice of positive behaviors including: Recognizing and reflecting on any progress daily, for any negative thought create a positive, try to be a person more of concern versus worrying and assuming that things are going in a negative direction, believing more in her ability to make important changes, focusing on things that she can control, more consistent positive self-talk, staying in the present and not going back to the past nor jumping too far into the future, maintaining good boundaries with others, staying in touch with people who are supportive, participating in activities that bring her pleasure including being with her pets and grandchildren, getting outside daily and walking as weather permits, finding the positives within herself and being able to name them, healthy nutrition and exercise, saying no when she needs to say no without guilt, and recognized the strength she shows working with goal directed behaviors to move in a direction that supports her improved emotional health and overall wellbeing. ? ?Goal review and progress/challenges noted with patient. ? ?Next appointment within 3 to 4 weeks. ? ?This record has been created using [employer].  Chart creation errors have been sought, but may not always have been located and corrected.  Such creation errors do not reflect on the standard of medical care provided. ? ? ?Shanon Ace, LCSW ? ? ? ? ? ? ? ? ? ? ? ? ? ? ? ? ? ? ?

## 2021-11-17 ENCOUNTER — Ambulatory Visit: Payer: Medicare Other | Admitting: Psychiatry

## 2021-11-17 ENCOUNTER — Encounter: Payer: Self-pay | Admitting: Psychiatry

## 2021-11-17 VITALS — Wt 181.0 lb

## 2021-11-17 DIAGNOSIS — F319 Bipolar disorder, unspecified: Secondary | ICD-10-CM

## 2021-11-17 DIAGNOSIS — F5101 Primary insomnia: Secondary | ICD-10-CM

## 2021-11-17 DIAGNOSIS — G2401 Drug induced subacute dyskinesia: Secondary | ICD-10-CM

## 2021-11-17 DIAGNOSIS — F4310 Post-traumatic stress disorder, unspecified: Secondary | ICD-10-CM

## 2021-11-17 MED ORDER — ZOLPIDEM TARTRATE 10 MG PO TABS: 10.0000 mg | ORAL_TABLET | Freq: Every evening | ORAL | 5 refills | Status: DC | PRN

## 2021-11-17 NOTE — Progress Notes (Signed)
MAKAELAH CRANFIELD ?[id number] ?Jan 25, 1956 ?66 y.o. ? ?Subjective:  ? ?Patient ID:  Theresa [name] is a 66 y.o. (DOB 1956-01-13) female. ? ?Chief Complaint:  ?Chief Complaint  ?Patient presents with  ? Manic Behavior  ? Sleeping Problem  ? ? ?HPI ?Theresa [name] presents to the office today for follow-up of Bipolar D/O and anxiety. She has been taking Olanzapine 10 mg. "Having some good nights and some not good nights of sleep." She reports that she is now able to distinguish between when she is feeling sleepy versus tired and that she needs to go to sleep when sleepy. She reports that she is now having more good nights of sleep. She reports that she is having 2-3 nights of poor sleep (2-3 hours a night) a week. She reports that her baseline is one night a week.  ? ?She reports that her mood is "shifting a bit, some ups and downs." She reports, "I'm not as manic or as hypomanic." She reports that she has some impulsivity at baseline- "when I think of something, I want it done right then." She reports that she notices some urgency about things. Denies risky behavior. Denies excessive spending. She reports that she is vacuuming some and TD will be worse afterwards. She reports that she has to move around when doing tasks, ie. Will walk around the [employer] while stirring something on the stove. Can sit for longer periods of time.  ? ?She reports that anxiety has been "the same." Denies panic attacks. Reports one episode where she was on the verge of panic. Denies any recent PTSD s/s.  ? ?She reports vague suicidal thoughts without intent.  ? ?She has been painting and enjoying it. Reports that it had been about 7 months since she last painted. She notices some tremor when she is painting.  ? ?She reports taking Xanax IR prn about every other day.  ? ?Past Psychiatric Medication Trials: ?Olanzapine ?Seroquel ?Saphris ?Depakote-self-injurious  behavior ?Lamictal ?Keppra ?Carbamazepine ?Xanax ?Klonopin ?Ambien ?Trazodone ?Benztropine ?Ingrezza ?Propranolol- Ineffective ? ? ?AIMS   ? ?[place] Visit from 10/13/2021 in [place] Visit from 08/04/2021 in [place] Visit from 03/09/2021 in [place] Visit from 09/30/2020 in [place] Visit from 06/16/2020 in [place]  ?AIMS Total Score '6 9 10 9 8  '$ ? ?  ? ?Mini-Mental   ? ?[place] Office Visit from 02/28/2017 in [facility] at [facility]  ?Total Score (max 30 points ) 29  ? ?  ? ?PHQ2-9   ? ?[place] Office Visit from 02/28/2017 in [facility] [place] Visit from 09/19/2015 in [facility] Care at [place] from 06/05/2015 in Primary Care at [place] from 03/10/2015 in [place] at [facility]  ?PHQ-2 Total Score 0 0 0 0  ? ?  ?  ? ?Review of Systems:  ?Review of Systems  ?Gastrointestinal:  Negative for diarrhea and nausea.  ?Musculoskeletal:  Negative for gait problem.  ?Neurological:  Positive for tremors.  ?Psychiatric/Behavioral:    ?     Please refer to HPI  ? ?Medications: I have reviewed the patient's current medications. ? ?Current Outpatient Medications  ?Medication Sig Dispense Refill  ? ALPRAZolam (XANAX XR) 2 MG 24 hr tablet TAKE 1 TABLET BY MOUTH IN THE  MORNING AND AT BEDTIME 180 tablet 1  ? ALPRAZolam (XANAX) 1 MG tablet TAKE 1  TABLET BY MOUTH  DAILY AS NEEDED FOR SEVERE  ANXIETY 30 tablet 2  ? carbamazepine (CARBATROL) 300 MG 12 hr capsule TAKE 1 CAPSULE BY MOUTH IN  THE MORNING AND 2 CAPSULES  BY MOUTH AT BEDTIME 270 capsule 3  ? cetirizine (ZYRTEC) 10 MG tablet Take 10 mg by mouth daily as needed.     ? clonazePAM (KLONOPIN) 1 MG tablet TAKE 1 TABLET BY MOUTH AT  BEDTIME 30 tablet 5  ? Deutetrabenazine (AUSTEDO) 12 MG TABS Take 1 tablet po BID with a 9 mg tablet to equal 21 mg  po BID 60 tablet 5  ? Deutetrabenazine (AUSTEDO) 9 MG TABS Take 1 tab po BID with a 12 mg tab to equal 21 mg BID 60 tablet 5  ? docusate sodium (COLACE) 50 MG capsule Take 50 mg by mouth daily as needed for mild constipation.    ? fluticasone (FLONASE) 50 MCG/ACT nasal spray Place into both nostrils daily as needed.     ? levETIRAcetam (KEPPRA) 500 MG tablet Take 1 tablet (500 mg total) by mouth 2 (two) times daily. 180 tablet 3  ? lovastatin (MEVACOR) 20 MG tablet TAKE 1 TABLET BY MOUTH  DAILY 90 tablet 1  ? Melatonin 10 MG TABS Take 1 tablet by mouth at bedtime. Reported on 01/30/2016    ? metFORMIN (GLUCOPHAGE) 500 MG tablet TAKE 1 TABLET BY MOUTH TWICE  DAILY WITH A MEAL 180 tablet 3  ? Multiple Vitamins-Minerals (CENTRUM SILVER PO) Take by mouth.    ? OLANZapine (ZYPREXA) 5 MG tablet Take 1 tablet (5 mg total) by mouth at bedtime. (Patient taking differently: Take 10 mg by mouth at bedtime.) 90 tablet 1  ? pantoprazole (PROTONIX) 40 MG tablet Take 1 tablet (40 mg total) by mouth daily as needed. 90 tablet 1  ? traZODone (DESYREL) 100 MG tablet Take 1-2 tablets (100-200 mg total) by mouth at bedtime as needed. for sleep 180 tablet 3  ? COVID-19 mRNA bivalent vaccine, Pfizer, injection Inject into the muscle. 0.3 mL 0  ? COVID-19 mRNA vaccine, Moderna, (MODERNA COVID-19 VACCINE) 100 MCG/0.5ML injection Inject into the muscle. 0.25 mL 0  ? nitrofurantoin, macrocrystal-monohydrate, (MACROBID) 100 MG capsule Take 1 capsule (100 mg total) by mouth 2 (two) times daily. (Patient not taking: Reported on 09/01/2021) 14 capsule 0  ? polyethylene glycol (MIRALAX / GLYCOLAX) 17 g packet Take 17 g by mouth daily as needed.    ? [START ON 01/01/2022] zolpidem (AMBIEN) 10 MG tablet Take 1 tablet (10 mg total) by mouth at bedtime as needed. 30 tablet 5  ? ?No current facility-administered medications for this visit.  ? ? ?Medication Side Effects: Other: Tremors, TD ? ?Allergies:  ?Allergies  ?Allergen Reactions  ? Lamictal  [Lamotrigine]   ?  Acute renal failure  ? Ropinirole Nausea And Vomiting  ? Tramadol   ? Codeine Swelling and Rash  ?  Can take hydrocodone  ? Penicillins   ?  Abdominal pain  ? ? ?Past Medical History:  ?Diagnosis Date  ? Adenomatous colon polyp   ? Allergy   ? Anxiety   ? Bipolar 1 disorder ([place])   ? Blood transfusion without reported diagnosis   ? Depression   ? Elevated LFTs   ? Hepatic steatosis 05/07/13  ? Hyperlipidemia   ? Seizures ([place])   ? Tardive dyskinesia   ? ? ?Past Medical History, Surgical history, Social history, and Family history were reviewed and updated as appropriate.  ? ?Please see  review of systems for further details on the patient's review from today.  ? ?Objective:  ? ?Physical Exam:  ?Wt 181 lb (82.1 kg)   BMI 29.21 kg/m?  ? ?Physical Exam ?Constitutional:   ?   General: She is not in acute distress. ?Musculoskeletal:     ?   General: No deformity.  ?Neurological:  ?   Mental Status: She is alert and oriented to person, place, and time.  ?   Coordination: Coordination normal.  ?Psychiatric:     ?   Attention and Perception: Attention and perception normal. She does not perceive auditory or visual hallucinations.     ?   Mood and Affect: Mood is not anxious or depressed. Affect is not labile, blunt, angry or inappropriate.     ?   Speech: Speech normal.     ?   Behavior: Behavior normal.     ?   Thought Content: Thought content normal. Thought content is not paranoid or delusional. Thought content does not include homicidal or suicidal ideation. Thought content does not include homicidal or suicidal plan.     ?   Cognition and Memory: Cognition and memory normal.     ?   Judgment: Judgment normal.  ?   Comments: Insight intact ?Mood presents as mildly elevated  ? ? ?Lab Review:  ?   ?Component Value Date/Time  ? NA 135 12/04/2020 1107  ? K 4.8 12/04/2020 1107  ? CL 97 12/04/2020 1107  ? CO2 30 12/04/2020 1107  ? GLUCOSE 84 12/04/2020 1107  ? BUN 10 12/04/2020 1107  ? CREATININE 0.82  12/04/2020 1107  ? CREATININE 0.79 05/08/2020 1015  ? CALCIUM 9.1 12/04/2020 1107  ? PROT 6.8 12/04/2020 1107  ? ALBUMIN 4.3 12/04/2020 1107  ? AST 29 12/04/2020 1107  ? ALT 38 (H) 12/04/2020 1107  ? ALKPHOS 109 12/04/2020 1107  ? BILITOT 0.4 12/04/2020 1107  ? ? ?   ?Component Value Date/Ti

## 2021-11-20 ENCOUNTER — Telehealth: Payer: Self-pay | Admitting: Psychiatry

## 2021-11-20 NOTE — Telephone Encounter (Signed)
Pt lvm at 3:20 pm wanting to let [name] know that the increase on her medicine has not changed anything. She was told to call and let jessica know. Her phone number is [phone] ?

## 2021-11-23 NOTE — Telephone Encounter (Signed)
Called patient and she said she has increased the olanzapine to 15 mg for 4 days and it didn't seem to make a difference in her sleeping, and she said in fact she hardly slept one night. Last night she dropped back to 10 mg and slept really well. She said she felt this is what she felt you would want her to do. She wanted you to know what she had done, is not asking for another medication.  ?

## 2021-11-23 NOTE — Telephone Encounter (Signed)
Patient notified

## 2021-11-23 NOTE — Telephone Encounter (Signed)
Agree with continuing 10 mg dose for now. Please advise her to call back if sleep worsens and/or she has manic symptoms.  ?

## 2021-11-25 ENCOUNTER — Telehealth: Payer: Self-pay | Admitting: Psychiatry

## 2021-11-25 NOTE — Telephone Encounter (Signed)
Patient called and said she has decreased the olanzapine to 10 mg and she is sleeping well on that. She said she is waking up early though and feels drugged all day. She said she had not changed any other medications and is taking them as prescribed. She denies manic sx but husband feels like her sx are a result of coming down from the mania. She says she has never felt this way before. She tried to get an earlier appt but you did not have any availability.  ?

## 2021-11-25 NOTE — Telephone Encounter (Signed)
Pt LVM reporting she is having issues with meds. Not feeling well. Contact # [phone] ?Next apt 5/5 ?

## 2021-11-25 NOTE — Telephone Encounter (Signed)
Would you please call her and offer her an apt tomorrow? It looks like there were a couple of cancellations.  ?

## 2021-11-26 ENCOUNTER — Ambulatory Visit: Payer: Medicare Other | Admitting: Psychiatry

## 2021-11-26 ENCOUNTER — Encounter: Payer: Self-pay | Admitting: Psychiatry

## 2021-11-26 DIAGNOSIS — F319 Bipolar disorder, unspecified: Secondary | ICD-10-CM | POA: Diagnosis not present

## 2021-11-26 DIAGNOSIS — F4310 Post-traumatic stress disorder, unspecified: Secondary | ICD-10-CM | POA: Diagnosis not present

## 2021-11-26 DIAGNOSIS — F5101 Primary insomnia: Secondary | ICD-10-CM | POA: Diagnosis not present

## 2021-11-26 NOTE — Progress Notes (Signed)
Theresa [name] ?[id number] ?1956/06/28 ?66 y.o. ? ?Subjective:  ? ?Patient ID:  Theresa [name] is a 65 y.o. (DOB [birth year]) female. ? ?Chief Complaint:  ?Chief Complaint  ?Patient presents with  ? Fatigue  ? ? ?HPI ?[name] presents to the office emergently today for increased fatigue. She is accompanied by her husband.  ?She reports that she took Olanzapine 15 mg for 5 days, then decreased to 10 mg at bedtime on Sunday, 4/2. She describes feeling like "the floor fell out from under me" on Tuesday and felt depressed. She reports that she does not feel depressed currently and feels very tired as if she is "drunk." She reports that her thoughts are also impaired. "I haven't been able to do much" and has been resting.  ? ?Last  night fell asleep at 9 pm, woke up during the night, and then went back to sleep after about 30 minutes of being awake. Estimates sleeping 8 hours last night and up to 10 hours. She has felt "more sleepy" and has slept some during the day. Motivation has been good and wants to do more but energy is low. "I don't feel depressed and I don't feel elevated." She denies severe anxiety. Appetite has been less. Has been eating one meal daily and 2 snacks. Denies SI.  ? ?Husband reports that she was manic until 11/24/21 with some improvement in manic s/s over the weekend. She reports that she has not had manic s/s for the last few days.  ? ?Past Psychiatric Medication Trials: ?Olanzapine ?Seroquel ?Saphris ?Depakote-self-injurious behavior ?Lamictal ?Keppra ?Carbamazepine ?Xanax ?Klonopin ?Ambien ?Trazodone ?Benztropine ?Ingrezza ?Propranolol- Ineffective ? ? ?AIMS   ? ?[place] Visit from 10/13/2021 in [place] Visit from 08/04/2021 in [place] Visit from 03/09/2021 in [place] Visit from 09/30/2020 in [place] Visit from 06/16/2020 in [place]  ?AIMS Total Score  '6 9 10 9 8  '$ ? ?  ? ?Mini-Mental   ? ?[place] Office Visit from 02/28/2017 in [facility] at [employer] [facility]  ?Total Score (max 30 points ) 29  ? ?  ? ?PHQ2-9   ? ?[place] Office Visit from 02/28/2017 in [facility] [place] Visit from 09/19/2015 in Primary Care at [place] from 06/05/2015 in Primary Care at [place] from 03/10/2015 in [place] [facility]  ?PHQ-2 Total Score 0 0 0 0  ? ?  ?  ? ?Review of Systems:  ?Review of Systems  ?Constitutional:  Positive for fatigue.  ?Musculoskeletal:  Negative for gait problem.  ?Neurological:  Positive for tremors. Negative for seizures and headaches.  ?Psychiatric/Behavioral:    ?     Please refer to HPI  ? ?Medications: I have reviewed the patient's current medications. ? ?Current Outpatient Medications  ?Medication Sig Dispense Refill  ? cetirizine (ZYRTEC) 10 MG tablet Take 10 mg by mouth daily as needed.     ? fluticasone (FLONASE) 50 MCG/ACT nasal spray Place into both nostrils daily as needed.     ? ALPRAZolam (XANAX XR) 2 MG 24 hr tablet TAKE 1 TABLET BY MOUTH IN THE  MORNING AND AT BEDTIME 180 tablet 1  ? ALPRAZolam (XANAX) 1 MG tablet TAKE 1 TABLET BY MOUTH  DAILY AS NEEDED FOR SEVERE  ANXIETY 30 tablet 2  ? carbamazepine (CARBATROL) 300 MG 12 hr capsule TAKE 1 CAPSULE BY MOUTH IN  THE MORNING AND 2 CAPSULES  BY MOUTH AT BEDTIME 270 capsule 3  ? clonazePAM (KLONOPIN) 1 MG tablet TAKE 1 TABLET BY MOUTH AT  BEDTIME 30 tablet 5  ? COVID-19 mRNA bivalent vaccine, Pfizer, injection Inject into the muscle. 0.3 mL 0  ? COVID-19 mRNA vaccine, Moderna, (MODERNA COVID-19 VACCINE) 100 MCG/0.5ML injection Inject into the muscle. 0.25 mL 0  ? Deutetrabenazine (AUSTEDO) 12 MG TABS Take 1 tablet po BID with a 9 mg tablet to equal 21 mg po BID 60 tablet 5  ? Deutetrabenazine (AUSTEDO) 9 MG TABS Take 1 tab po BID with a 12 mg tab to equal 21 mg BID 60 tablet 5  ? docusate sodium (COLACE)  50 MG capsule Take 50 mg by mouth daily as needed for mild constipation.    ? levETIRAcetam (KEPPRA) 500 MG tablet Take 1 tablet (500 mg total) by mouth 2 (two) times daily. 180 tablet 3  ? lovastatin (MEVACOR) 20 MG tablet TAKE 1 TABLET BY MOUTH  DAILY 90 tablet 1  ? Melatonin 10 MG TABS Take 1 tablet by mouth at bedtime. Reported on 01/30/2016    ? metFORMIN (GLUCOPHAGE) 500 MG tablet TAKE 1 TABLET BY MOUTH TWICE  DAILY WITH A MEAL 180 tablet 3  ? Multiple Vitamins-Minerals (CENTRUM SILVER PO) Take by mouth.    ? nitrofurantoin, macrocrystal-monohydrate, (MACROBID) 100 MG capsule Take 1 capsule (100 mg total) by mouth 2 (two) times daily. (Patient not taking: Reported on 09/01/2021) 14 capsule 0  ? OLANZapine (ZYPREXA) 5 MG tablet Take 1 tablet (5 mg total) by mouth at bedtime. (Patient taking differently: Take 10 mg by mouth at bedtime.) 90 tablet 1  ? pantoprazole (PROTONIX) 40 MG tablet Take 1 tablet (40 mg total) by mouth daily as needed. 90 tablet 1  ? polyethylene glycol (MIRALAX / GLYCOLAX) 17 g packet Take 17 g by mouth daily as needed.    ? traZODone (DESYREL) 100 MG tablet Take 1-2 tablets (100-200 mg total) by mouth at bedtime as needed. for sleep 180 tablet 3  ? [START ON 01/01/2022] zolpidem (AMBIEN) 10 MG tablet Take 1 tablet (10 mg total) by mouth at bedtime as needed. 30 tablet 5  ? ?No current facility-administered medications for this visit.  ? ? ?Medication Side Effects: Fatigue ? ?Allergies:  ?Allergies  ?Allergen Reactions  ? Lamictal [Lamotrigine]   ?  Acute renal failure  ? Ropinirole Nausea And Vomiting  ? Tramadol   ? Codeine Swelling and Rash  ?  Can take hydrocodone  ? Penicillins   ?  Abdominal pain  ? ? ?Past Medical History:  ?Diagnosis Date  ? Adenomatous colon polyp   ? Allergy   ? Anxiety   ? Bipolar 1 disorder ([place])   ? Blood transfusion without reported diagnosis   ? Depression   ? Elevated LFTs   ? Hepatic steatosis 05/07/13  ? Hyperlipidemia   ? Seizures ([place])   ? Tardive dyskinesia    ? ? ?Past Medical History, Surgical history, Social history, and Family history were reviewed and updated as appropriate.  ? ?Please see review of systems for further details on the patient's review from today.  ? ?Objective:  ? ?Physical Exam:  ?There were no vitals taken for this visit. ? ?Physical Exam ?Constitutional:   ?   General: She is not in acute distress. ?Musculoskeletal:     ?   General: No deformity.  ?Neurological:  ?   Mental Status: She is alert and oriented  to person, place, and time.  ?   Coordination: Coordination normal.  ?Psychiatric:     ?   Attention and Perception: Attention and perception normal. She does not perceive auditory or visual hallucinations.     ?   Mood and Affect: Mood normal. Mood is not anxious or depressed. Affect is not labile, blunt, angry or inappropriate.     ?   Speech: Speech normal.     ?   Behavior: Behavior normal.     ?   Thought Content: Thought content normal. Thought content is not paranoid or delusional. Thought content does not include homicidal or suicidal ideation. Thought content does not include homicidal or suicidal plan.     ?   Cognition and Memory: Cognition and memory normal.     ?   Judgment: Judgment normal.  ?   Comments: Insight intact  ? ? ?Lab Review:  ?   ?Component Value Date/Time  ? NA 135 12/04/2020 1107  ? K 4.8 12/04/2020 1107  ? CL 97 12/04/2020 1107  ? CO2 30 12/04/2020 1107  ? GLUCOSE 84 12/04/2020 1107  ? BUN 10 12/04/2020 1107  ? CREATININE 0.82 12/04/2020 1107  ? CREATININE 0.79 05/08/2020 1015  ? CALCIUM 9.1 12/04/2020 1107  ? PROT 6.8 12/04/2020 1107  ? ALBUMIN 4.3 12/04/2020 1107  ? AST 29 12/04/2020 1107  ? ALT 38 (H) 12/04/2020 1107  ? ALKPHOS 109 12/04/2020 1107  ? BILITOT 0.4 12/04/2020 1107  ? ? ?   ?Component Value Date/Time  ? WBC 3.9 (L) 05/14/2021 4098  ? RBC 4.75 05/14/2021 0958  ? HGB 13.7 05/14/2021 0958  ? HCT 41.2 05/14/2021 0958  ? PLT 200.0 05/14/2021 0958  ? MCV 86.9 05/14/2021 0958  ? MCV 87.7 02/07/2013 1110   ? MCH 29.8 05/08/2020 1015  ? MCHC 33.2 05/14/2021 0958  ? RDW 13.5 05/14/2021 0958  ? ? ?No results found for: POCLITH, LITHIUM  ? ?Lab Results  ?Component Value Date  ? CBMZ 5.2 02/01/2019  ?  ? ?.

## 2021-11-26 NOTE — Telephone Encounter (Signed)
Apt 4/6 @ 2:30 ?

## 2021-12-07 ENCOUNTER — Ambulatory Visit (INDEPENDENT_AMBULATORY_CARE_PROVIDER_SITE_OTHER): Payer: Medicare Other | Admitting: Psychiatry

## 2021-12-07 DIAGNOSIS — F319 Bipolar disorder, unspecified: Secondary | ICD-10-CM | POA: Diagnosis not present

## 2021-12-07 NOTE — Progress Notes (Signed)
?    [facility] Counselor/Therapist Progress Note ? ?Patient ID: Theresa [name], MRN: [id number],   ? ?Date: 12/07/2021 ? ?Time Spent: 45 minutes  ? ?Treatment Type: Individual Therapy ? ?Reported Symptoms: depression, tired, unenthusiastic, some tardive dyskinesia , sleeping better, not feeling mania anymore, no SI ? ?Mental Status Exam: ? ?Appearance:    casual  ?Behavior:  Appropriate, Sharing, and Motivated  ?Motor:  Normal  ?Speech/Language:   Clear and Coherent  ?Affect:  Flat  ?Mood:  depressed  ?Thought process:  goal directed  ?Thought content:    WNL  ?Sensory/Perceptual disturbances:    WNL  ?Orientation:  oriented to person, place, time/date, situation, day of week, month of year, year, and stated date of December 07, 2021  ?Attention:  Fair  ?Concentration:  Fair  ?Memory:  "Some short term memory concerns" and dr is aware  ?Fund of knowledge:   Good  ?Insight:    Good and Fair  ?Judgment:   Good  ?Impulse Control:  Good  ? ?Risk Assessment: ?Danger to Self:  No ?Self-injurious Behavior: No ?Danger to Others: No ?Duty to Warn:no ?Physical Aggression / Violence:No  ?Access to Firearms a concern: No  ?Gang Involvement:No  ? ?Subjective: Patient today reporting less anxiety and more depression. Denies any current SI and understands it she was to experience this she would need to contact our office or go hospital ED for further evaluation. States that she's "been trying to paint more recently as that can help but also frustrates me as my painting isn't as good when I'm not doing as well emotionally. Having sleep issues even though she is on sleep meds. Encouraged several strategies including taking her meds as prescribed, to go to bed at a regular time each night, refraining from daytime sleeping, not eating nor drinking within an hour within going to bed, keep temperature in bedroom comfortable as in not too warm nor too cool, and less strenuous exercising such as walking, and trying to have some time of  calmness and not worrying or focusing on upsetting things or events before going to bed.  Also encouraged phones to be silenced.  Brought in some of her paintings that she focuses on even more so in challenging times, explaining how it can be calming for her except when she is having extremes in her mood.  Worked some on some frustration management as she explained how when she gets more manic or more depressed it is hard for her not to be frustrated and it is hard to focus on things that are normally helpful. ? ?Interventions: Solution-Oriented/Positive Psychology and Ego-Supportive ? ?Treatment goals: ?Treatment goals remain on treatment plan as patient works with strategies to achieve her goals. Progress is assessed each session and is documented in treatment note. ?Long term goal: ?Reduce overall level, frequency, and intensity of the anxiety so that daily functioning is not impaired. ?Short-term goal: ?Increase understanding of the beliefs and messages that produce anxiety, depression, and worry. ?Strategies: ?Identify , challenge, and replace anxious/negative/depressive self-talk with positive, realistic, and empowering self talk. ? ?Diagnosis: ?  ICD-10-CM   ?1. Bipolar 1 disorder ([place])  F31.9   ?  ? ?Plan: Patient today showing motivation and engagement in session working with her depressed feelings that she is now having, but denies any SI.  States that she does at times feel that but never acts on it and is aware that if she felt she was losing control she definitely needed to contact our office  or go to [facility] ED.  States that she is now in a depressed mood although not "deeply depressed" but as she states, it does feel different from when I am on the opposite end and feeling more anxious.  Reports that she has tried some painting which is normally an activity that soothes her and has not been too successful with it recently.  Discussed a sleep question she had as noted above and tried to give her some  helpful suggestions and also looked at what her typical patterns are and what she might want to do including some exercise, not napping any during the daytime, focus on calming activities and thoughts, and not eating too close to bedtime.  Encouraged patient in the practice of positive behaviors including: Following up on behaviors that can help her with a more regular sleep pattern, recognizing and reflecting on her progress often, try to be a person more of concern versus worrying and assuming that things are going in a negative direction, believing more in her ability to make important changes, focusing on things that she can control or change, more consistent positive self talk, staying in the present and not going back to the past nor jumping too far into the future, maintaining good boundaries with others, staying in touch with people who are supportive, participating in activities that bring her pleasure including being with her pets and grandchildren, getting outside daily and walking as weather permits, finding the positives within herself and be able to name them, healthy nutrition and exercise, saying no when she needs to say no, and realize the strength she shows working with goal-directed behaviors to move in a direction that supports her improved emotional health. ? ?Goal review and progress/challenges noted with patient. ? ?Next appointment within 3 to 4 weeks. ? ?This record has been created using [employer].  Chart creation errors have been sought, but may not always have been located and corrected.  Such creation errors do not reflect on the standard of medical care provided. ? ? ?Shanon Ace, LCSW ? ? ? ? ? ? ? ? ? ? ? ? ? ? ? ? ? ? ?

## 2021-12-14 ENCOUNTER — Other Ambulatory Visit: Payer: Self-pay | Admitting: Psychiatry

## 2021-12-14 DIAGNOSIS — F4310 Post-traumatic stress disorder, unspecified: Secondary | ICD-10-CM

## 2021-12-25 ENCOUNTER — Ambulatory Visit: Payer: Medicare Other | Admitting: Psychiatry

## 2021-12-30 ENCOUNTER — Other Ambulatory Visit: Payer: Self-pay | Admitting: Psychiatry

## 2021-12-30 DIAGNOSIS — F4310 Post-traumatic stress disorder, unspecified: Secondary | ICD-10-CM

## 2021-12-31 ENCOUNTER — Encounter: Payer: Self-pay | Admitting: Psychiatry

## 2021-12-31 ENCOUNTER — Ambulatory Visit: Payer: Medicare Other | Admitting: Psychiatry

## 2021-12-31 DIAGNOSIS — G2401 Drug induced subacute dyskinesia: Secondary | ICD-10-CM

## 2021-12-31 DIAGNOSIS — F319 Bipolar disorder, unspecified: Secondary | ICD-10-CM | POA: Diagnosis not present

## 2021-12-31 MED ORDER — OLANZAPINE 10 MG PO TABS: 10.0000 mg | ORAL_TABLET | Freq: Every day | ORAL | 0 refills | Status: DC

## 2021-12-31 MED ORDER — AUSTEDO 12 MG PO TABS: ORAL_TABLET | ORAL | 5 refills | Status: DC

## 2021-12-31 MED ORDER — AUSTEDO 9 MG PO TABS: ORAL_TABLET | ORAL | 5 refills | Status: DC

## 2021-12-31 NOTE — Progress Notes (Signed)
Theresa [name] ?[id number] ?1956/03/20 ?66 y.o. ? ?Subjective:  ? ?Patient ID:  Theresa [name] is a 66 y.o. (DOB July 13, 1956) female. ? ?Chief Complaint:  ?Chief Complaint  ?Patient presents with  ? Other  ?  Mood lability  ? Follow-up  ?  Anxiety, insomnia  ? ? ?HPI ?Theresa [name] presents to the office today for follow-up of mood disturbance, anxiety, and insomnia. She is accompanied by husband. She reports that she is still "up and down." She reports that she has some nights where she sleeps 11 hours a night after she has felt tired during the day. She reports that she usually sleeps 6-7 hours a night. They report that she has some occasional manic s/s. She reports that she reduced Olanzapine to 7.5 mg QHS for 5 days and noticed more manic symptoms and resumed 10 mg. She reports that she slept better on Olanzapine 10 mg po QHS. Husband has notices some mild manic s/s at times over the last 1-1.5 weeks. She reports that her energy fluctuates with highs, lows, and normal energy. Husband reports that she will focus on certain things, such as taking a walk, when he thinks she may need to rest instead. Husband reports that she has had some possible mixed episode symptoms. Husband reports that she has been "zombie like" the last few days. She reports racing thoughts some days. She reports "it's cycling but less." Denies any impulsivity or risky behavior. She reports appetite has been good and weight has stabilized. She reports that concentration is "alright on good days." She has been doing some art work which is challenging with TD. She reports that she is having to change the way she does things and makes modifications. She enjoys parts of her painting and walking with the dogs and riding in the car. Denies SI.  ? ?She noticed when she took Xanax 1 mg prior to painting and she noticed the tremor was less and she could stand there for longer. She reports that she is not taking Xanax 1 mg daily.  ? ?[place]  ? ?Past Psychiatric Medication Trials: ?Olanzapine ?Seroquel ?Saphris ?Depakote-self-injurious behavior ?Lamictal ?Keppra ?Carbamazepine ?Xanax ?Klonopin ?Ambien ?Trazodone ?Benztropine ?Ingrezza ?Propranolol- Ineffective ? ? ?AIMS   ? ?[place] Visit from 10/13/2021 in [place] Visit from 08/04/2021 in [place] Visit from 03/09/2021 in [place] Visit from 09/30/2020 in [place] Visit from 06/16/2020 in [place]  ?AIMS Total Score '6 9 10 9 8  '$ ? ?  ? ?Mini-Mental   ? ?[place] Office Visit from 02/28/2017 in [facility] at [employer]  ?Total Score (max 30 points ) 29  ? ?  ? ?PHQ2-9   ? ?[place] Office Visit from 02/28/2017 in [facility] [place] Visit from 09/19/2015 in Primary Care at [place] from 06/05/2015 in Primary Care at [place] from 03/10/2015 in [place] at [facility]  ?PHQ-2 Total Score 0 0 0 0  ? ?  ?  ? ?Review of Systems:  ?Review of Systems  ?Musculoskeletal:  Negative for gait problem.  ?Neurological:  Positive for tremors.  ?     She and her husband report that she has had infrequent episodes of a "dizzy but not really" that is somewhat similar to what she had in the past.   ?  Psychiatric/Behavioral:    ?     Please refer to HPI  ? ?Medications: I have reviewed the patient's current medications. ? ?Current Outpatient Medications  ?Medication Sig Dispense Refill  ? carbamazepine (CARBATROL) 300 MG 12 hr capsule TAKE 1 CAPSULE BY MOUTH IN  THE MORNING AND 2 CAPSULES  BY MOUTH AT BEDTIME 270 capsule 3  ? ALPRAZolam (XANAX XR) 2 MG 24 hr tablet TAKE 1 TABLET BY MOUTH IN THE  MORNING AND AT BEDTIME 180 tablet 1  ? [START ON 01/05/2022] ALPRAZolam (XANAX) 1 MG tablet TAKE 1 TABLET BY MOUTH DAILY AS  NEEDED FOR SEVERE ANXIETY 30 tablet  1  ? cetirizine (ZYRTEC) 10 MG tablet Take 10 mg by mouth daily as needed.     ? clonazePAM (KLONOPIN) 1 MG tablet TAKE 1 TABLET BY MOUTH AT  BEDTIME 30 tablet 5  ? COVID-19 mRNA bivalent vaccine, Pfizer, injection Inject into the muscle. 0.3 mL 0  ? COVID-19 mRNA vaccine, Moderna, (MODERNA COVID-19 VACCINE) 100 MCG/0.5ML injection Inject into the muscle. 0.25 mL 0  ? Deutetrabenazine (AUSTEDO) 12 MG TABS Take 1 tablet po BID with a 9 mg tablet to equal 21 mg po BID 60 tablet 5  ? Deutetrabenazine (AUSTEDO) 9 MG TABS Take 1 tab po BID with a 12 mg tab to equal 21 mg BID 60 tablet 5  ? docusate sodium (COLACE) 50 MG capsule Take 50 mg by mouth daily as needed for mild constipation.    ? fluticasone (FLONASE) 50 MCG/ACT nasal spray Place into both nostrils daily as needed.     ? levETIRAcetam (KEPPRA) 500 MG tablet Take 1 tablet (500 mg total) by mouth 2 (two) times daily. 180 tablet 3  ? lovastatin (MEVACOR) 20 MG tablet TAKE 1 TABLET BY MOUTH  DAILY 90 tablet 1  ? Melatonin 10 MG TABS Take 1 tablet by mouth at bedtime. Reported on 01/30/2016    ? metFORMIN (GLUCOPHAGE) 500 MG tablet TAKE 1 TABLET BY MOUTH TWICE  DAILY WITH A MEAL 180 tablet 3  ? Multiple Vitamins-Minerals (CENTRUM SILVER PO) Take by mouth.    ? nitrofurantoin, macrocrystal-monohydrate, (MACROBID) 100 MG capsule Take 1 capsule (100 mg total) by mouth 2 (two) times daily. (Patient not taking: Reported on 09/01/2021) 14 capsule 0  ? OLANZapine (ZYPREXA) 10 MG tablet Take 1 tablet (10 mg total) by mouth at bedtime. 90 tablet 0  ? pantoprazole (PROTONIX) 40 MG tablet Take 1 tablet (40 mg total) by mouth daily as needed. 90 tablet 1  ? polyethylene glycol (MIRALAX / GLYCOLAX) 17 g packet Take 17 g by mouth daily as needed.    ? traZODone (DESYREL) 100 MG tablet Take 1-2 tablets (100-200 mg total) by mouth at bedtime as needed. for sleep 180 tablet 3  ? zolpidem (AMBIEN) 10 MG tablet Take 1 tablet (10 mg total) by mouth at bedtime as needed. 30 tablet 5   ? ?No current facility-administered medications for this visit.  ? ? ?Medication Side Effects: Other: occ feelings of fatigue ? ?Allergies:  ?Allergies  ?Allergen Reactions  ? Lamictal [Lamotrigine]   ?  Acute renal failure  ? Ropinirole Nausea And Vomiting  ? Tramadol   ? Codeine Swelling and Rash  ?  Can take hydrocodone  ? Penicillins   ?  Abdominal pain  ? ? ?Past Medical History:  ?Diagnosis Date  ? Adenomatous colon polyp   ? Allergy   ? Anxiety   ? Bipolar 1 disorder ([place])   ?  Blood transfusion without reported diagnosis   ? Depression   ? Elevated LFTs   ? Hepatic steatosis 05/07/13  ? Hyperlipidemia   ? Seizures ([place])   ? Tardive dyskinesia   ? ? ?Past Medical History, Surgical history, Social history, and Family history were reviewed and updated as appropriate.  ? ?Please see review of systems for further details on the patient's review from today.  ? ?Objective:  ? ?Physical Exam:  ?There were no vitals taken for this visit. ? ?Physical Exam ?Constitutional:   ?   General: She is not in acute distress. ?Musculoskeletal:     ?   General: No deformity.  ?Neurological:  ?   Mental Status: She is alert and oriented to person, place, and time.  ?   Coordination: Coordination normal.  ?Psychiatric:     ?   Attention and Perception: Attention and perception normal. She does not perceive auditory or visual hallucinations.     ?   Mood and Affect: Mood is not depressed. Affect is not labile, blunt, angry or inappropriate.     ?   Speech: Speech normal.     ?   Behavior: Behavior normal.     ?   Thought Content: Thought content normal. Thought content is not paranoid or delusional. Thought content does not include homicidal or suicidal ideation. Thought content does not include homicidal or suicidal plan.     ?   Cognition and Memory: Cognition and memory normal.     ?   Judgment: Judgment normal.  ?   Comments: Insight intact ?Mood is mildly anxious  ? ? ?Lab Review:  ?   ?Component Value Date/Time  ? NA 135  12/04/2020 1107  ? K 4.8 12/04/2020 1107  ? CL 97 12/04/2020 1107  ? CO2 30 12/04/2020 1107  ? GLUCOSE 84 12/04/2020 1107  ? BUN 10 12/04/2020 1107  ? CREATININE 0.82 12/04/2020 1107  ? CREATININE 0.79 09/16/2

## 2022-01-04 ENCOUNTER — Ambulatory Visit: Payer: Medicare Other | Admitting: Psychiatry

## 2022-01-04 DIAGNOSIS — F319 Bipolar disorder, unspecified: Secondary | ICD-10-CM | POA: Diagnosis not present

## 2022-01-04 NOTE — Progress Notes (Signed)
?    [facility] Counselor/Therapist Progress Note ? ?Patient ID: Theresa [name], MRN: [id number],   ? ?Date: 01/04/2022 ? ?Time Spent: 50 minutes  ? ?Treatment Type: Individual Therapy ? ?Reported Symptoms: "still cycling up and down" and gets exhausted, first said she was depressed and then said I'm not depressed about anything but I am exhausted, "not motivated", depressed "more because I'm exhausted"; Denies any SI. States what helps most in watching TV in bed . ? ?Mental Status Exam: ? ?Appearance:   Casual     ?Behavior:  Appropriate and Sharing  ?Motor:  Normal  ?Speech/Language:   Clear and Coherent  ?Affect:  Depressed  ?Mood:  depressed  ?Thought process:  goal directed  ?Thought content:    Rumination and overthinking  ?Sensory/Perceptual disturbances:    WNL  ?Orientation:  oriented to person, place, time/date, situation, day of week, month of year, year, and stated date of Jan 04, 2022  ?Attention:  Good  ?Concentration:  Good and Fair  ?Memory:  WNL  ?Fund of knowledge:   Good  ?Insight:    Good and Fair  ?Judgment:   Good and Fair  ?Impulse Control:  Good  ? ?Risk Assessment: ?Danger to Self:  No ?Self-injurious Behavior: No ?Danger to Others: No ?Duty to Warn:no ?Physical Aggression / Violence:No  ?Access to Firearms a concern: No  ?Gang Involvement:No  ? ?Subjective:  Patient in today reporting depression, exhaustion, "still cycling up and down", "depressed because I'm exhausted." Does better she reports when watching tv in bed "because of my tardive dyskinesia."  Exhausted "but I did sleep last night for about 6 hours, but usually sleep 8 hrs and do wake up for about 1/2 hr or an hour. Her painting sometimes helps but not always. Based on patient report, the things that help are "checking out" and waiting for depression and exhaustion to pass, walking the dogs, watching tv in bed. States she is avoiding daytime sleeping. Affect brightened when she spoke about her 6 dogs and how much she enjoys  them. Gets good exercise in her walking the dogs and her exercise bike 2 times weekly.  Energy did not particularly improve but her affect did improve to be more full and bright over the course of session although still feeling tired. ? ?Interventions: Solution-Oriented/Positive Psychology and Insight-Oriented ? ?Treatment goals: ?Treatment goals remain on treatment plan as patient works with strategies to achieve her goals. Progress is assessed each session and is documented in treatment note. ?Long term goal: ?Reduce overall level, frequency, and intensity of the anxiety so that daily functioning is not impaired. ?Short-term goal: ?Increase understanding of the beliefs and messages that produce anxiety, depression, and worry. ?Strategies: ?Identify , challenge, and replace anxious/negative/depressive self-talk with positive, realistic, and empowering self talk. ?  ?Diagnosis: ?  ICD-10-CM   ?1. Bipolar 1 disorder (Theresa [name])  F31.9   ?  ? ?Plan: Patient today showed good engagement although reported not being as motivated.  She actually worked quite well once the session got underway and we emphasized the positives (getting outside with her dogs and watching favorite TV programs) as much as the negatives she reported as far as feeling more depressed, exhausted, and still having some "cycling up and down with my mood".  Adds that she does tend to be more depressed when she gets exhausted.  Discussed activities and self-care routines that seem to help her during the time she struggles more.  Also encouraged her to to be able to  take breaks from her phone and husband helps support that as well.  Denies any current SI.  One of her desired strategies is normally to use painting as a tool to help her mood but "when I cycle, my painting is more difficult".  Her husband support is important and she really enjoys the support and finds she feels when interacting with her 6 dogs.  Encouraged calming activities and if she were to  feel worse at any point or have thoughts of harming self, patient agrees that she would contact our office or go to local behavioral health hospital or Hospital ED. Encouraged patient in practicing more positive behaviors including: Recognizing and reflecting on her progress often, trying to be a person more of concern versus worrying and assuming that things are going in a negative direction, following up on behaviors that can help her with more regular sleep patterns, believing more in her ability to make important changes, focusing on things she can control or change, more consistent positive self talk, staying in the present and not going back to the past nor jumping too far into the future, good boundaries with others, staying in touch with people who are supportive, participating in activities that bring her pleasure including being with pets and grandchildren and painting, getting outside daily and walking as weather permits, finding the positives within herself and be able to name them, healthy nutrition and exercise, and recognize the strength she shows when she works with goal-directed behaviors to move in a direction that supports her improved emotional health and overall wellbeing. ? ?Goal review and progress/challenges noted with patient. ? ?Next appointment within approximately 1 month. ? ?This record has been created using [employer].  Chart creation errors have been sought, but may not always have been located and corrected.  Such creation errors do not reflect on the standard of medical care provided. ? ? ?Shanon Ace, LCSW ? ? ? ? ? ? ? ? ? ? ? ? ? ? ? ? ? ? ?

## 2022-01-12 ENCOUNTER — Telehealth: Payer: Self-pay | Admitting: Psychiatry

## 2022-01-12 DIAGNOSIS — F319 Bipolar disorder, unspecified: Secondary | ICD-10-CM

## 2022-01-12 MED ORDER — OLANZAPINE 7.5 MG PO TABS: 7.5000 mg | ORAL_TABLET | Freq: Every day | ORAL | 0 refills | Status: DC

## 2022-01-12 NOTE — Telephone Encounter (Signed)
See note from patient. I called her and let her know that a new Rx had been sent for 10 mg and she said the pharmacy never notified her. She said the 10 mg makes her so sleepy. She said she slept 10 hours last night. If she needs to get up to RR she goes right back to sleep. She said she thinks the lower dose worked because she wasn't as manic as she had been.

## 2022-01-12 NOTE — Telephone Encounter (Signed)
Next visit is 02/11/22. [name] called and said that she has been taking Olanzapine 10 mg but is now taking two 5 mg pills that she had at home but has now run out of them. She currently has 7.5 mg of the Olanzapine and has been taking this dosage for three days. She states that she is not sleepy now, no manic and is feeling really good. Can she continue with this dosage? Her phone number is [phone].

## 2022-01-12 NOTE — Telephone Encounter (Signed)
Please let her know script for Olanzapine 7.5 mg po QHS was sent to [facility].

## 2022-01-13 NOTE — Telephone Encounter (Signed)
Patient notified of Rx.  

## 2022-01-25 ENCOUNTER — Ambulatory Visit: Payer: Medicare Other | Admitting: Psychiatry

## 2022-02-02 ENCOUNTER — Ambulatory Visit (INDEPENDENT_AMBULATORY_CARE_PROVIDER_SITE_OTHER): Payer: Medicare Other | Admitting: Psychiatry

## 2022-02-02 DIAGNOSIS — F319 Bipolar disorder, unspecified: Secondary | ICD-10-CM

## 2022-02-02 NOTE — Progress Notes (Signed)
[facility] Counselor/Therapist Progress Note  Patient ID: Theresa [name], MRN: [id number],    Date: 02/02/2022  Time Spent: 55 minutes   Treatment Type: Individual Therapy  Reported Symptoms: anxiety, "no depression" and "am doing well bipolar wise"   Mental Status Exam:  Appearance:   Casual     Behavior:  Appropriate, Sharing, and Motivated  Motor:  Normal  Speech/Language:   Clear and Coherent  Affect:  Anxious , "no depression"  Mood:  anxious  Thought process:  goal directed  Thought content:    Some obsessive thoughts  Sensory/Perceptual disturbances:    WNL  Orientation:  oriented to person, place, time/date, situation, day of week, month of year, year, and stated date of February 02, 2022  Attention:  Fair  Concentration:  Fair  Memory:  Some short term memory concerns . Dr is aware  Fund of knowledge:   Good and Fair  Insight:    Good and Fair  Judgment:   Good  Impulse Control:  Good   Risk Assessment: Danger to Self:  No Self-injurious Behavior: No Danger to Others: No Duty to Warn:no Physical Aggression / Violence:No  Access to Firearms a concern: No  Gang Involvement:No   Subjective: Patient in today reporting "being better." Did mention an occasion of thinking about SI but "not for any reason and that sometimes happens, and I watch TV and that distraction gets me off of that thinking, and I do think about my husband and how hurt he would be if that happened." Discussed ways of interrupting those thought patterns which she agreed to practice. Sleeping is better most nights. Feeling "pretty good and am more stable with my bipolar." Attended an art show and "actually initiated some conversation with people I didn't know, and I did good in the crowd and wasn't all nervous and shaky." States about over a week ago had some issues with a friend who overtalks and got very frustrated and more stressed and finally went and laid down which helped. Wanted help in setting  appropriate limits with this and a couple of other friends who have personality traits that can be challenging and patient working today on some better ways to set healthier limits and boundaries without guilt feelings. Thankful to "be feeling better with my bipolar" and has been out a little more. Also shared another closer friend situation who tends to contact patient when drinking and is wanting to set limits with her as well "but not hurt our friendship". Feeling more positive about her ability to communicate more effectively with friends including when she needs to establish some healthy limits. Walking her dogs and using her exercise bike is helpful physically and mentally.   Interventions: Cognitive Behavioral Therapy and Solution-Oriented/Positive Psychology  Treatment goals: Treatment goals remain on treatment plan as patient works with strategies to achieve her goals. Progress is assessed each session and is documented in treatment note. Long term goal: Reduce overall level, frequency, and intensity of the anxiety so that daily functioning is not impaired. Short-term goal: Increase understanding of the beliefs and messages that produce anxiety, depression, and worry. Strategies: Identify , challenge, and replace anxious/negative/depressive self-talk with positive, realistic, and empowering self talk.  Diagnosis:   ICD-10-CM   1. Bipolar 1 disorder ([place])  F31.9      Plan: Patient today showing motivation and good participation today as she shared how most recently she has been doing better in terms of her bipolar disorder.  Did  mention an occasion of thinking about SI which I processed more thoroughly with her and she stated that she never has gotten close to acting on it but also did not work very hard to interrupt those thought patterns.  Discussed this more in session and she agrees to begin trying to recognize those thoughts more earlier and practice interrupting them "as I do not make  a plan to do it or anything like that".  Regardless, I really encouraged her to practice interrupting any SI and if there is difficulty with that and she begins to feel that she is losing control and cannot remain safe, then she needs to contact our office or go to local ED or behavioral health hospital.  Patient agrees and seems committed to work on interrupting and limiting those thoughts and let someone know that she is having them rather than deal with them on her own.  States she is enjoying feeling not so affected by her bipolar and is having a few good days most recently and hoping they will continue.  As noted above, she has gotten out a little more including going to an art show and meeting and talking with new people which is something she would normally avoid.  States that she felt like she handled it very well and feels good about herself for doing it.  Denies any current thoughts of SI.  Continues to enjoy a couple of her more close friendships, doing walks with her dog, and some occasional painting. Encouraged patient in her practice of more positive behaviors including: Following up with behaviors that can help her with more regular sleep patterns, recognizing and reflecting on the progress she has made, trying to be a person of concern versus excessive worrying and assuming that things are going to go in a negative direction, believing more in her ability to make important changes, focusing on things she can control or change, more consistent positive self talk, staying in the present and trying not to go back to the past nor jumping too far into the future, good boundaries with others, staying in touch with people who are supportive of her, participate in activities that bring her pleasure including being with pets and grandchildren and painting, getting outside daily and walking as weather permits, finding the positives within herself and be able to name them, healthy nutrition and exercise, and  realize the strength she shows when she works with goal directed behaviors to move in a direction that supports her improved emotional health.  Goal review and progress/challenges noted with patient.  Next appointment within 3 weeks.  This record has been created using [employer].  Chart creation errors have been sought, but may not always have been located and corrected.  Such creation errors do not reflect on the standard of medical care provided.  Shanon Ace, LCSW

## 2022-02-04 ENCOUNTER — Other Ambulatory Visit: Payer: Self-pay | Admitting: Psychiatry

## 2022-02-04 DIAGNOSIS — F4310 Post-traumatic stress disorder, unspecified: Secondary | ICD-10-CM

## 2022-02-05 NOTE — Telephone Encounter (Signed)
Has apt next week, this is for her next refill

## 2022-02-11 ENCOUNTER — Telehealth (INDEPENDENT_AMBULATORY_CARE_PROVIDER_SITE_OTHER): Payer: Medicare Other | Admitting: Psychiatry

## 2022-02-11 ENCOUNTER — Encounter: Payer: Self-pay | Admitting: Psychiatry

## 2022-02-11 DIAGNOSIS — F4310 Post-traumatic stress disorder, unspecified: Secondary | ICD-10-CM | POA: Diagnosis not present

## 2022-02-11 DIAGNOSIS — F319 Bipolar disorder, unspecified: Secondary | ICD-10-CM

## 2022-02-11 DIAGNOSIS — F5101 Primary insomnia: Secondary | ICD-10-CM | POA: Diagnosis not present

## 2022-02-11 MED ORDER — OLANZAPINE 7.5 MG PO TABS: 7.5000 mg | ORAL_TABLET | Freq: Every day | ORAL | 0 refills | Status: DC

## 2022-02-11 MED ORDER — TRAZODONE HCL 100 MG PO TABS: 100.0000 mg | ORAL_TABLET | Freq: Every evening | ORAL | 3 refills | Status: DC | PRN

## 2022-02-11 NOTE — Progress Notes (Signed)
Theresa [name] [id number] 03/30/1956 66 y.o.  Virtual Visit via Video Note  I connected with pt @ on 02/11/22 at 10:00 AM EDT by a video enabled telemedicine application and verified that I am speaking with the correct person using two identifiers.   I discussed the limitations of evaluation and management by telemedicine and the availability of in person appointments. The patient expressed understanding and agreed to proceed.  I discussed the assessment and treatment plan with the patient. The patient was provided an opportunity to ask questions and all were answered. The patient agreed with the plan and demonstrated an understanding of the instructions.   The patient was advised to call back or seek an in-person evaluation if the symptoms worsen or if the condition fails to improve as anticipated.  I provided 25 minutes of non-face-to-face time during this encounter.  The patient was located at home.  The provider was located at [place].   [name], PMHNP   Subjective:   Patient ID:  Theresa [name] is a 66 y.o. (DOB 12/01/1955) female.  Chief Complaint:  Chief Complaint  Patient presents with   Follow-up    Anxiety, mood disturbance, insomnia    HPI Theresa [name] presents for follow-up of mood disturbance, anxiety, insomnia, and TD. She reports that her mood has been stable- "the best it has been in the long time."  She reports that she has been taking Olanzapine to 7.5 mg po QHS. She is sleeping up to 10 hours a night some nights and will wake up feeling very rested. She has some nights (about twice a week) that she is not sleeping as much (2-3 hours) and then will sleep well the following nights. She reports that she has never slept 10 hours a night. She reports that she notices some occ slight depressed symptoms. She reports that her energy and motivation have been somewhat low and better with increased sleep. She is going on walks and using her stationary  bike. She reports some anxiety, "but not out of control anxiety."   Husband notices some occ manic symptoms that involve executive function. He reports that overall she has had less mood lability.   She reports that she had a couple of nights where she was "obsessing about suicide." She reports that there was not a trigger or stressor to this. She reports that thoughts were intrusive and it "was not out of control" and did not have intent. Denies current SI.   She reports that tremor affects her drawing and she is now trying a different type of art work. Recently had some art in an art show. She took a Xanax before going to the art show and "did real well." She made a goal of talking with some people she did not know.   She reports that she wakes up "feeling good" and then will feel sleepy for about 20-60 minutes after taking morning medications.  Review of Systems:  Review of Systems  Gastrointestinal:  Positive for diarrhea.  Musculoskeletal:  Negative for gait problem.  Neurological:  Negative for tremors.  Psychiatric/Behavioral:         Please refer to HPI    Medications: I have reviewed the patient's current medications.  Current Outpatient Medications  Medication Sig Dispense Refill   ALPRAZolam (XANAX XR) 2 MG 24 hr tablet TAKE 1 TABLET BY MOUTH IN THE  MORNING AND AT BEDTIME 180 tablet 0   ALPRAZolam (XANAX) 1 MG tablet TAKE 1 TABLET BY  MOUTH DAILY AS  NEEDED FOR SEVERE ANXIETY 30 tablet 1   carbamazepine (CARBATROL) 300 MG 12 hr capsule TAKE 1 CAPSULE BY MOUTH IN  THE MORNING AND 2 CAPSULES  BY MOUTH AT BEDTIME 270 capsule 3   cetirizine (ZYRTEC) 10 MG tablet Take 10 mg by mouth daily as needed.      clonazePAM (KLONOPIN) 1 MG tablet TAKE 1 TABLET BY MOUTH AT  BEDTIME 30 tablet 5   COVID-19 mRNA bivalent vaccine, Pfizer, injection Inject into the muscle. 0.3 mL 0   COVID-19 mRNA vaccine, Moderna, (MODERNA COVID-19 VACCINE) 100 MCG/0.5ML injection Inject into the muscle. 0.25 mL  0   Deutetrabenazine (AUSTEDO) 12 MG TABS Take 1 tablet po BID with a 9 mg tablet to equal 21 mg po BID 60 tablet 5   Deutetrabenazine (AUSTEDO) 9 MG TABS Take 1 tab po BID with a 12 mg tab to equal 21 mg BID 60 tablet 5   docusate sodium (COLACE) 50 MG capsule Take 50 mg by mouth daily as needed for mild constipation.     fluticasone (FLONASE) 50 MCG/ACT nasal spray Place into both nostrils daily as needed.      levETIRAcetam (KEPPRA) 500 MG tablet Take 1 tablet (500 mg total) by mouth 2 (two) times daily. 180 tablet 3   lovastatin (MEVACOR) 20 MG tablet TAKE 1 TABLET BY MOUTH  DAILY 90 tablet 1   Melatonin 10 MG TABS Take 1 tablet by mouth at bedtime. Reported on 01/30/2016     metFORMIN (GLUCOPHAGE) 500 MG tablet TAKE 1 TABLET BY MOUTH TWICE  DAILY WITH A MEAL 180 tablet 3   Multiple Vitamins-Minerals (CENTRUM SILVER PO) Take by mouth.     nitrofurantoin, macrocrystal-monohydrate, (MACROBID) 100 MG capsule Take 1 capsule (100 mg total) by mouth 2 (two) times daily. (Patient not taking: Reported on 09/01/2021) 14 capsule 0   OLANZapine (ZYPREXA) 7.5 MG tablet Take 1 tablet (7.5 mg total) by mouth at bedtime. 90 tablet 0   pantoprazole (PROTONIX) 40 MG tablet Take 1 tablet (40 mg total) by mouth daily as needed. 90 tablet 1   polyethylene glycol (MIRALAX / GLYCOLAX) 17 g packet Take 17 g by mouth daily as needed.     traZODone (DESYREL) 100 MG tablet Take 1-2 tablets (100-200 mg total) by mouth at bedtime as needed. for sleep 180 tablet 3   zolpidem (AMBIEN) 10 MG tablet Take 1 tablet (10 mg total) by mouth at bedtime as needed. 30 tablet 5   No current facility-administered medications for this visit.    Medication Side Effects: Other: Drowsiness, tremor  Allergies:  Allergies  Allergen Reactions   Lamictal [Lamotrigine]     Acute renal failure   Ropinirole Nausea And Vomiting   Tramadol    Codeine Swelling and Rash    Can take hydrocodone   Penicillins     Abdominal pain    Past  Medical History:  Diagnosis Date   Adenomatous colon polyp    Allergy    Anxiety    Bipolar 1 disorder ([place])    Blood transfusion without reported diagnosis    Depression    Elevated LFTs    Hepatic steatosis 05/07/13   Hyperlipidemia    Seizures (HCC)    Tardive dyskinesia     Family History  Problem Relation Age of Onset   Colon polyps Mother    Cancer Mother    Heart disease Mother    Alcoholism Mother    Cancer Father  Alcoholism Father    Bipolar disorder Father    Drug abuse Brother    Bipolar disorder Other    Colon cancer Neg Hx    Stomach cancer Neg Hx    Breast cancer Neg Hx     Social History   Socioeconomic History   Marital status: Married    Spouse name: Not on file   Number of children: Not on file   Years of education: Not on file   Highest education level: Not on file  Occupational History   Not on file  Tobacco Use   Smoking status: Never   Smokeless tobacco: Never  Vaping Use   Vaping Use: Never used  Substance and Sexual Activity   Alcohol use: Yes    Comment: occasionaly 1 a month   Drug use: No   Sexual activity: Not Currently  Other Topics Concern   Not on file  Social History Narrative   Lives in 2 story home with her husband   Has 2 adult children   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 Strain: [place]  (03/06/2020)   Overall Financial Resource Strain (CARDIA)    Difficulty of Paying Living Expenses: Not very hard  Food Insecurity: No Food Insecurity (03/06/2020)   Hunger Vital Sign    Worried About Running Out of Food in the Last Year: Never true    [place] in the Last Year: Never true  Transportation Needs: No Transportation Needs (03/06/2020)   PRAPARE - Hydrologist (Medical): No    Lack of Transportation (Non-Medical): No  Physical Activity: Not on file  Stress: Not on file  Social Connections: Not on file   Intimate Partner Violence: Not At Risk (03/06/2020)   Humiliation, Afraid, Rape, and Kick questionnaire    Fear of Current or Ex-Partner: No    Emotionally Abused: No    Physically Abused: No    Sexually Abused: No    Past Medical History, Surgical history, Social history, and Family history were reviewed and updated as appropriate.   Please see review of systems for further details on the patient's review from today.   Objective:   Physical Exam:  There were no vitals taken for this visit.  Physical Exam Neurological:     Mental Status: She is alert and oriented to person, place, and time.     Cranial Nerves: No dysarthria.  Psychiatric:        Attention and Perception: Attention and perception normal.        Mood and Affect: Mood normal.        Speech: Speech normal.        Behavior: Behavior is cooperative.        Thought Content: Thought content normal. Thought content is not paranoid or delusional. Thought content does not include homicidal or suicidal ideation. Thought content does not include homicidal or suicidal plan.        Cognition and Memory: Cognition and memory normal.        Judgment: Judgment normal.     Comments: Insight intact     Lab Review:     Component Value Date/Time   NA 135 12/04/2020 1107   K 4.8 12/04/2020 1107   CL 97 12/04/2020 1107   CO2 30 12/04/2020 1107   GLUCOSE 84 12/04/2020 1107   BUN 10 12/04/2020 1107   CREATININE  0.82 12/04/2020 1107   CREATININE 0.79 05/08/2020 1015   CALCIUM 9.1 12/04/2020 1107   PROT 6.8 12/04/2020 1107   ALBUMIN 4.3 12/04/2020 1107   AST 29 12/04/2020 1107   ALT 38 (H) 12/04/2020 1107   ALKPHOS 109 12/04/2020 1107   BILITOT 0.4 12/04/2020 1107       Component Value Date/Time   WBC 3.9 (L) 05/14/2021 0958   RBC 4.75 05/14/2021 0958   HGB 13.7 05/14/2021 0958   HCT 41.2 05/14/2021 0958   PLT 200.0 05/14/2021 0958   MCV 86.9 05/14/2021 0958   MCV 87.7 02/07/2013 1110   MCH 29.8 05/08/2020 1015    MCHC 33.2 05/14/2021 0958   RDW 13.5 05/14/2021 0958    No results found for: "POCLITH", "LITHIUM"   Lab Results  Component Value Date   CBMZ 5.2 02/01/2019     .res Assessment: Plan:   Pt seen for 25 minutes and time spent discussing response to Olanzapine 7.5 mg po QHS and agreed that this dose seems to be effective for her mood symptoms and better tolerated compared to higher doses. Discussed drowsiness in the morning and that it may be from Xanax XR. She reports that drowsiness is brief and that benefits are outweighing side effects at this time.  Will continue current plan of care. Continue Metformin 500 mg twice daily for antipsychotic-induced weight gain.  Continue Carbamazepine 300 mg in the morning and 600 mg at bedtime for mood stabilization. Continue Xanax XR 2 mg po BID for anxiety.  Continue Xanax 1 mg as needed for severe anxiety.  Continue Klonopin 1 mg at bedtime for insomnia.  Continue [place] 21 mg po BID for TD. Continue Trazodone 100-200 mg po QHS prn insomnia.  Continue Ambien 10 mg po QHS prn insomnia.  Recommend continuing therapy with Rinaldo Cloud, LCSW.  Pt to follow-up in 2 months or sooner if clinically indicated.  Patient advised to contact office with any questions, adverse effects, or acute worsening in signs and symptoms.   Theresa [name] was seen today for follow-up.  Diagnoses and all orders for this visit:  Bipolar 1 disorder ([place]) -     OLANZapine (ZYPREXA) 7.5 MG tablet; Take 1 tablet (7.5 mg total) by mouth at bedtime.  Primary insomnia -     traZODone (DESYREL) 100 MG tablet; Take 1-2 tablets (100-200 mg total) by mouth at bedtime as needed. for sleep  Posttraumatic stress disorder     Please see After Visit Summary for patient specific instructions.  Future Appointments  Date Time Provider [place]  02/22/2022 11:00 AM Shanon Ace, LCSW [facility] None  03/22/2022 12:00 PM Shanon Ace, LCSW [facility] None  04/12/2022 11:00 AM Shanon Ace, LCSW [facility] None  04/15/2022 10:30 AM [name], PMHNP [facility] None  05/17/2022 11:20 AM Copland, Gay Filler, MD [facility]  05/18/2022 11:00 AM Shanon Ace, LCSW [facility] None  06/07/2022 11:00 AM Shanon Ace, LCSW [facility] None  09/30/2022 10:00 AM Cameron Sprang, MD [facility] None    No orders of the defined types were placed in this encounter.     -------------------------------

## 2022-02-14 ENCOUNTER — Other Ambulatory Visit: Payer: Self-pay | Admitting: Family Medicine

## 2022-02-14 DIAGNOSIS — K219 Gastro-esophageal reflux disease without esophagitis: Secondary | ICD-10-CM

## 2022-02-22 ENCOUNTER — Ambulatory Visit: Payer: Medicare Other | Admitting: Psychiatry

## 2022-02-22 DIAGNOSIS — F319 Bipolar disorder, unspecified: Secondary | ICD-10-CM | POA: Diagnosis not present

## 2022-02-22 NOTE — Progress Notes (Signed)
[facility] Counselor/Therapist Progress Note  Patient ID: Theresa [name], MRN: [id number],    Date: 02/22/2022  Time Spent: 48 minutes   Treatment Type: Individual Therapy  Reported Symptoms: anxious, tired,  "had 1 night of ptsd symptom" and "I could not figure out a trigger but it went away the next day."  Denies depression initially and later said  "some depression".  Still having hard time painting due to tremore and painting is one of her hobbies.    Mental Status Exam:  Appearance:   Casual     Behavior:  Appropriate, Sharing, and Motivated  Motor:  Normal  Speech/Language:   Clear and Coherent  Affect:  anxious  Mood:  anxious  Thought process:  goal directed  Thought content:    WNL  Sensory/Perceptual disturbances:    WNL  Orientation:  oriented to person, place, time/date, situation, day of week, month of year, year, and stated date of February 22, 2022  Attention:  Fair  Concentration:  Fair  Memory:  Some short term memory issues  Fund of knowledge:   Good and Fair  Insight:    Good and Fair  Judgment:   Good  Impulse Control:  Good   Risk Assessment: Danger to Self:  No Self-injurious Behavior: No Danger to Others: No Duty to Warn:no Physical Aggression / Violence:No  Access to Firearms a concern: No  Gang Involvement:No   Subjective:  Patient in today reporting this past week "I had 1 night of ptsd from but cannot think of any triggers, and got very little sleep that night and was tired and am moving forward from it, hasn't had any further occurrences of it." Wanted to discuss the ptsd in session today and included issues with her alcoholic parents, their making dangerous decisions. Some occasional not going back to sleep as easily. Still feels she is "more stable with my bipolar." States she spoke with 2 of her friends who are excessive talkers and patient stated that she did try again to "set a little of a boundary" with them and is hoping it will be   helpful and did explore some other ways of setting limits if needed in order to take care of herself.  Interventions: Solution-Oriented/Positive Psychology, Ego-Supportive, and Insight-Oriented  Treatment goals: Treatment goals remain on treatment plan as patient works with strategies to achieve her goals. Progress is assessed each session and is documented in treatment note. Long term goal: Reduce overall level, frequency, and intensity of the anxiety so that daily functioning is not impaired. Short-term goal: Increase understanding of the beliefs and messages that produce anxiety, depression, and worry. Strategies: Identify , challenge, and replace anxious/negative/depressive self-talk with positive, realistic, and empowering self talk.  Diagnosis:   ICD-10-CM   1. Bipolar 1 disorder ([place])  F31.9      Plan: Patient in today still reporting that she has been stable since last appointment except "1 night of PTSD where I stayed in bed but had thoughts from the past but then was okay the next morning and could not think of any triggers that cause this."  Has not had any further recurrences since that time.  As noted above she did want to discuss this some in session as well as the issue of setting limits with a couple of her friends as far as time spent on phone.  She did set these limits and seemed to feel good about it and also realizing that she may have to  remind them about the limits from time to time.  Good motivation and participation today in session.  Denying any "thinking about SI which she had reported an occasion last session but indicated that she had never gotten close to acting out on it".  Encouraged patient in pursuing the activities that she enjoys at home and especially with her dogs.  Her painting has slowed down some due to a tremor in her hand.  Enjoying "not being so affected by her bipolar" and hoping this continues. Encouraged patient in her practice of more positive  behaviors including: Following up with behaviors that can help her with more regular sleep patterns, recognizing and reflecting on the progress she has made, trying to be a person of concern versus excessive worrying and assuming that things are going to go in a negative direction, believing more in her ability to make important changes, focusing on the things she can control or change, consistent positive self talk, staying in the present and trying not to go back to the past nor jumping too far into the future, good boundaries with others, staying in touch with people who are supportive of her, participate in activities that bring her pleasure including being with her pets and grandchildren and painting, getting outside daily and walking as weather permits, finding the positives within herself and be able to name them, healthy nutrition and exercise, and recognize the strength she shows working with goal directed behaviors to move in a direction that supports her improved emotional health and overall outlook.  Review and progress/challenges noted with patient.  Next appointment within 3 to 4 weeks.  This record has been created using [employer].  Chart creation errors have been sought, but may not always have been located and corrected.  Such creation errors do not reflect on the standard of medical care provided.   Shanon Ace, LCSW

## 2022-02-23 ENCOUNTER — Other Ambulatory Visit: Payer: Self-pay | Admitting: Family Medicine

## 2022-02-24 ENCOUNTER — Other Ambulatory Visit: Payer: Self-pay | Admitting: Psychiatry

## 2022-02-24 DIAGNOSIS — G40309 Generalized idiopathic epilepsy and epileptic syndromes, not intractable, without status epilepticus: Secondary | ICD-10-CM

## 2022-02-24 DIAGNOSIS — F5101 Primary insomnia: Secondary | ICD-10-CM

## 2022-02-24 DIAGNOSIS — F319 Bipolar disorder, unspecified: Secondary | ICD-10-CM

## 2022-02-24 DIAGNOSIS — F4310 Post-traumatic stress disorder, unspecified: Secondary | ICD-10-CM

## 2022-02-25 ENCOUNTER — Other Ambulatory Visit: Payer: Self-pay | Admitting: Obstetrics and Gynecology

## 2022-02-25 DIAGNOSIS — Z1231 Encounter for screening mammogram for malignant neoplasm of breast: Secondary | ICD-10-CM

## 2022-02-26 NOTE — Telephone Encounter (Signed)
Patient said she did not currently need refills of either benzo. Will investigate with [facility] about the clonazepam.

## 2022-02-26 NOTE — Telephone Encounter (Signed)
Talked with patient and she said the last refill of clonazepam was May 10.

## 2022-02-26 NOTE — Telephone Encounter (Signed)
LVM to [facility]. RF request shows she got 8 clonazepam, but PMP database shows 30. [facility] said they sent 2 other refills for 11 each so patient did get 30 tablets. Not sure what date to use for last refill. Last alprazolam fill was 6/19 so too early for that.

## 2022-03-22 ENCOUNTER — Ambulatory Visit (INDEPENDENT_AMBULATORY_CARE_PROVIDER_SITE_OTHER): Payer: Medicare Other | Admitting: Psychiatry

## 2022-03-22 DIAGNOSIS — F319 Bipolar disorder, unspecified: Secondary | ICD-10-CM | POA: Diagnosis not present

## 2022-03-22 NOTE — Progress Notes (Signed)
[facility] Counselor/Therapist Progress Note  Patient ID: Theresa [name], MRN: [id number],    Date: 03/22/2022  Time Spent: 50 minutes   Treatment Type: Individual Therapy  Reported Symptoms:  anxiety, "don't feel depressed but have had some cycling with my bipolar" and my sleep has increased some (went to bed last night at 7pm and woke up at 5 a.m. and it feels good that I got that sleep  Mental Status Exam:  Appearance:   Casual     Behavior:  Appropriate, Sharing, and "semi-motivated"  Motor:  Normal  Speech/Language:   Clear and Coherent  Affect:  Anxieous "mild"  Mood:  anxious  Thought process:  goal directed  Thought content:    WNL  Sensory/Perceptual disturbances:    WNL  Orientation:  oriented to person, place, time/date, situation, day of week, month of year, year, and stated date of March 22, 2022  Attention:  Good  Concentration:  Good  Memory:  Some occasional short term memory issues  Fund of knowledge:   Good  Insight:    Good and Fair  Judgment:   Good  Impulse Control:  Good and Fair   Risk Assessment: Danger to Self:  No Self-injurious Behavior: No Danger to Others: No Duty to Warn:no Physical Aggression / Violence:No  Access to Firearms a concern: No  Gang Involvement:No   Subjective: Patient in today reporting "my bipolar cycling some and I've had it happen before." Working more to set boundaries with friends who call and talk too much and that feels some better. Today states she has had her brother on her mind. "Not obsessive thinking but have been thinking this brother who overdosed and ended his life. Patient bother by the fact "my mom said she wished it had me that dies versus my brother." Mom had been drinking at the time. Patient very hurt by mom's statement .  Patient today processing this experience more today  which was very hurtful. Explains she tried to make mom happy because "I thought if I could make her happy she'd stop drinking but  realized later I couldn't."  Continue to process this more during session.  Interventions: Cognitive Behavioral Therapy and Ego-Supportive  Treatment goals: Treatment goals remain on treatment plan as patient works with strategies to achieve her goals. Progress is assessed each session and is documented in treatment note. Long term goal: Reduce overall level, frequency, and intensity of the anxiety so that daily functioning is not impaired. Short-term goal: Increase understanding of the beliefs and messages that produce anxiety, depression, and worry. Strategies: Identify , challenge, and replace anxious/negative/depressive self-talk with positive, realistic, and empowering self talk.  Diagnosis:   ICD-10-CM   1. Bipolar 1 disorder ([place])  F31.9      Plan: Patient today reporting some cycling of "my bipolar", stating "I have no idea why this happens but what helps me is being by myself some and not agitating husband nor others. The being by herself she states is good for her but does not really explain how. Dealt with "so much bad stuff over time." Shares she's lost some friends due to bipolar, "but that really doesn't matter to me, as I have smaller group of friends and that is okay as they understand."  Feels she does have people in her life that truly care about her and that "is important".  Reports having done some of her painting recently.  Enjoys the company of her dogs and staying in touch  with people who are supportive of her, and remaining involved in inside and outside activities that "help me find pleasure and meaning especially when my mood can be unstable at times". Encouraged patient in her practice of more positive behaviors including: Trying to be a person of concern versus excessive worrying and assuming that things are going in a negative direction, following up with behaviors that can help her with more regular sleep, recognizing progress she has made over time, believing more and  her ability to make important changes, focusing on the things she can change versus cannot, positive self talk, staying in the present and trying not to go back to the past nor jumping too far into the future, keeping good boundaries with others as needed, staying in touch with people who are supportive of her, participating in activities that bring her pleasure including her pets and grandchildren and painting, getting outside daily and walking, finding the positives within herself, healthy nutrition and exercise, and realize the strengths she shows working with goal directed behaviors moving in a direction that supports her improved emotional health.  Goal review and progress/challenges noted with patient.  Next appointment within 3 weeks.  This record has been created using [employer].  Chart creation errors have been sought, but may not always have been located and corrected.  Such creation errors do not reflect on the standard of medical care provided.   Shanon Ace, LCSW

## 2022-03-23 ENCOUNTER — Encounter: Payer: Self-pay | Admitting: Gastroenterology

## 2022-03-27 ENCOUNTER — Other Ambulatory Visit: Payer: Self-pay | Admitting: Psychiatry

## 2022-03-27 DIAGNOSIS — F319 Bipolar disorder, unspecified: Secondary | ICD-10-CM

## 2022-04-07 ENCOUNTER — Encounter: Payer: Self-pay | Admitting: Family Medicine

## 2022-04-12 ENCOUNTER — Ambulatory Visit: Payer: Medicare Other | Admitting: Psychiatry

## 2022-04-12 DIAGNOSIS — F319 Bipolar disorder, unspecified: Secondary | ICD-10-CM
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
## 2022-04-15 ENCOUNTER — Ambulatory Visit: Payer: Medicare Other | Admitting: Psychiatry

## 2022-04-17 ENCOUNTER — Other Ambulatory Visit: Payer: Self-pay | Admitting: Psychiatry

## 2022-04-17 DIAGNOSIS — F4310 Post-traumatic stress disorder, unspecified: Secondary | ICD-10-CM

## 2022-04-17 DIAGNOSIS — F5101 Primary insomnia: Secondary | ICD-10-CM

## 2022-04-22 ENCOUNTER — Ambulatory Visit (INDEPENDENT_AMBULATORY_CARE_PROVIDER_SITE_OTHER): Payer: Medicare Other | Admitting: Psychiatry

## 2022-04-22 ENCOUNTER — Encounter: Payer: Self-pay | Admitting: Psychiatry

## 2022-04-22 VITALS — Wt 180.0 lb

## 2022-04-22 DIAGNOSIS — F5101 Primary insomnia: Secondary | ICD-10-CM | POA: Diagnosis not present

## 2022-04-22 DIAGNOSIS — F319 Bipolar disorder, unspecified: Secondary | ICD-10-CM | POA: Diagnosis not present

## 2022-04-22 DIAGNOSIS — F4310 Post-traumatic stress disorder, unspecified: Secondary | ICD-10-CM | POA: Diagnosis not present

## 2022-04-22 MED ORDER — OLANZAPINE 10 MG PO TABS
10.0000 mg | ORAL_TABLET | Freq: Every day | ORAL | 0 refills | Status: DC
Start: 2022-04-22 — End: 2022-05-17

## 2022-04-22 NOTE — Progress Notes (Signed)
Theresa [name] [id number] Dec 07, 1955 66 y.o.  Subjective:   Patient ID:  Theresa [name] is a 66 y.o. (DOB September 14, 1955) female.  Chief Complaint:  Chief Complaint  Patient presents with   Insomnia   Sleeping Problem   Manic Behavior    Insomnia   Theresa [name] presents to the office today for follow-up of Bipolar D/O, anxiety, and insomnia. She is accompanied by her husband.   She reports that she was sleeping more and then had 4 nights recently of disrupted sleep. She reports that she slept well last night for the first night in several days.   She noticed her mood was "good when I was sleeping well and I didn't feel manic" and that her husband thought she may be manic. She reports that she felt "physically sick" the last few days when she did not sleep well. She reports that her mood is "good today." Denies any significant depression- "I haven't had the really low depression." Husband reports that she has seemed manic the last 2-3 weeks. Husband notices she is more tangential and rushed when she is manic. He notices that when manic she is unaware of what is happening around her and does not retain information. They report that she is excessively talkative when manic. Some increased difficulty with concentration. She reports that she has had some mild anxiety on occasion and overall has been "good." Husband reports some increase in spending. She reports that her appetite has been good. Denies SI.   She reports that she notices worsening tremor with holding things.   She has an upcoming trip for 2 weeks. Leaves for trip a week from Monday.  Ambien filled 04/21/22. Xanax XR last filled 04/13/22 x 1. Xanax IR last filled 03/30/22 x 2 Klonopin last filled 03/31/22 x2    [place] Office Visit from 10/13/2021 in [place] Visit from 08/04/2021 in [place] Visit from 03/09/2021 in [place] Visit from  09/30/2020 in [facility] Visit from 06/16/2020 in [place] Total Score '6 9 10 9 8      [facility] [place] Office Visit from 02/28/2017 in [place] at [facility]  Total Score (max 30 points ) 29      [id number]    [place] Visit from 02/28/2017 in [place] at [place] Visit from 09/19/2015 in [facility] at [facility] [place] from 06/05/2015 in [place] at [place] from 03/10/2015 in [facility] at [facility] Total Score 0 0 0 0        Review of Systems:  Review of Systems  Musculoskeletal:  Negative for gait problem.  Neurological:  Positive for tremors.  Psychiatric/Behavioral:  The patient has insomnia.        Please refer to HPI    Medications: I have reviewed the patient's current medications.  Current Outpatient Medications  Medication Sig Dispense Refill   ALPRAZolam (XANAX XR) 2 MG 24 hr tablet TAKE 1 TABLET BY MOUTH IN THE  MORNING AND AT BEDTIME 180 tablet 0   zolpidem (AMBIEN) 10 MG tablet TAKE 1 TABLET BY MOUTH AT  BEDTIME AS NEEDED 30 tablet 5   ALPRAZolam (XANAX) 1 MG tablet TAKE 1 TABLET BY MOUTH DAILY AS  NEEDED FOR SEVERE ANXIETY 30 tablet 3   carbamazepine (CARBATROL) 300 MG 12 hr capsule  TAKE 1 CAPSULE BY MOUTH IN  THE MORNING AND 2 CAPSULES  BY MOUTH AT BEDTIME 300 capsule 2   cetirizine (ZYRTEC) 10 MG tablet Take 10 mg by mouth daily as needed.      clonazePAM (KLONOPIN) 1 MG tablet TAKE 1 TABLET BY MOUTH AT  BEDTIME 30 tablet 3   COVID-19 mRNA bivalent vaccine, Pfizer, injection Inject into the muscle. 0.3 mL 0   COVID-19 mRNA vaccine, Moderna, (MODERNA COVID-19 VACCINE) 100 MCG/0.5ML injection Inject into the muscle. 0.25 mL 0   Deutetrabenazine (AUSTEDO) 12 MG TABS Take 1 tablet po BID with a 9 mg tablet to equal 21 mg po BID 60 tablet 5   Deutetrabenazine (AUSTEDO) 9 MG TABS Take 1 tab po BID with a 12  mg tab to equal 21 mg BID 60 tablet 5   docusate sodium (COLACE) 50 MG capsule Take 50 mg by mouth daily as needed for mild constipation.     fluticasone (FLONASE) 50 MCG/ACT nasal spray Place into both nostrils daily as needed.      levETIRAcetam (KEPPRA) 500 MG tablet Take 1 tablet (500 mg total) by mouth 2 (two) times daily. 180 tablet 3   lovastatin (MEVACOR) 20 MG tablet TAKE 1 TABLET BY MOUTH DAILY 90 tablet 3   Melatonin 10 MG TABS Take 1 tablet by mouth at bedtime. Reported on 01/30/2016     metFORMIN (GLUCOPHAGE) 500 MG tablet TAKE 1 TABLET BY MOUTH TWICE  DAILY WITH A MEAL 180 tablet 3   Multiple Vitamins-Minerals (CENTRUM SILVER PO) Take by mouth.     nitrofurantoin, macrocrystal-monohydrate, (MACROBID) 100 MG capsule Take 1 capsule (100 mg total) by mouth 2 (two) times daily. (Patient not taking: Reported on 09/01/2021) 14 capsule 0   OLANZapine (ZYPREXA) 10 MG tablet Take 1 tablet (10 mg total) by mouth at bedtime. 30 tablet 0   pantoprazole (PROTONIX) 40 MG tablet TAKE 1 TABLET BY MOUTH DAILY AS  NEEDED 90 tablet 3   polyethylene glycol (MIRALAX / GLYCOLAX) 17 g packet Take 17 g by mouth daily as needed.     traZODone (DESYREL) 100 MG tablet Take 1-2 tablets (100-200 mg total) by mouth at bedtime as needed. for sleep 180 tablet 3   No current facility-administered medications for this visit.    Medication Side Effects: Other: Tremor  Allergies:  Allergies  Allergen Reactions   Lamictal [Lamotrigine]     Acute renal failure   Ropinirole Nausea And Vomiting   Tramadol    Codeine Swelling and Rash    Can take hydrocodone   Penicillins     Abdominal pain    Past Medical History:  Diagnosis Date   Adenomatous colon polyp    Allergy    Anxiety    Bipolar 1 disorder ([place])    Blood transfusion without reported diagnosis    Depression    Elevated LFTs    Hepatic steatosis 05/07/13   Hyperlipidemia    Seizures (H[facility])    Tardive dyskinesia     Past Medical History,  Surgical history, Social history, and Family history were reviewed and updated as appropriate.   Please see review of systems for further details on the patient's review from today.   Objective:   Physical Exam:  Wt 180 lb (81.6 kg)   BMI 29.05 kg/m   Physical Exam Constitutional:      General: She is not in acute distress. Musculoskeletal:        General: No deformity.  Neurological:  Mental Status: She is alert and oriented to person, place, and time.     Coordination: Coordination normal.  Psychiatric:        Attention and Perception: Attention and perception normal. She does not perceive auditory or visual hallucinations.        Mood and Affect: Mood is not anxious or depressed. Affect is not labile, blunt, angry or inappropriate.        Speech: Speech normal.        Behavior: Behavior normal.        Thought Content: Thought content normal. Thought content is not paranoid or delusional. Thought content does not include homicidal or suicidal ideation. Thought content does not include homicidal or suicidal plan.        Cognition and Memory: Cognition and memory normal.        Judgment: Judgment is impulsive.     Comments: Insight fair Mood is slightly elevated     Lab Review:     Component Value Date/Time   NA 135 12/04/2020 1107   K 4.8 12/04/2020 1107   CL 97 12/04/2020 1107   CO2 30 12/04/2020 1107   GLUCOSE 84 12/04/2020 1107   BUN 10 12/04/2020 1107   CREATININE 0.82 12/04/2020 1107   CREATININE 0.79 05/08/2020 1015   CALCIUM 9.1 12/04/2020 1107   PROT 6.8 12/04/2020 1107   ALBUMIN 4.3 12/04/2020 1107   AST 29 12/04/2020 1107   ALT 38 (H) 12/04/2020 1107   ALKPHOS 109 12/04/2020 1107   BILITOT 0.4 12/04/2020 1107       Component Value Date/Time   WBC 3.9 (L) 05/14/2021 0958   RBC 4.75 05/14/2021 0958   HGB 13.7 05/14/2021 0958   HCT 41.2 05/14/2021 0958   PLT 200.0 05/14/2021 0958   MCV 86.9 05/14/2021 0958   MCV 87.7 02/07/2013 1110   MCH 29.8  05/08/2020 1015   MCHC 33.2 05/14/2021 0958   RDW 13.5 05/14/2021 0958    No results found for: "POCLITH", "LITHIUM"   Lab Results  Component Value Date   CBMZ 5.2 02/01/2019     .res Assessment: Plan:    Pt seen for 30 minutes and time spent discussing increasing Olanzapine to 10 mg po QHS short term to stabilize manic symptoms and insomnia. Discussed that she has had some manic s/s in the past prior to trips and that she may want to continue Olanzapine 10 mg po QHS through her trip.  Continue Metformin 500 mg twice daily for antipsychotic-induced weight gain.  Continue Carbamazepine 300 mg in the morning and 600 mg at bedtime for mood stabilization. Continue Xanax XR 2 mg po BID for anxiety.  Continue Xanax 1 mg as needed for severe anxiety.  Continue Klonopin 1 mg at bedtime for insomnia.  Continue [place] 21 mg po BID for TD. Continue Trazodone 100-200 mg po QHS prn insomnia.  Continue Ambien 10 mg po QHS prn insomnia.  Recommend continuing therapy with Rinaldo Cloud, LCSW.  Pt to follow-up with this provider in 4-6 weeks or sooner if clinically indicated.  Patient advised to contact office with any questions, adverse effects, or acute worsening in signs and symptoms.   Theresa [name] was seen today for insomnia, sleeping problem and manic behavior.  Diagnoses and all orders for this visit:  Bipolar 1 disorder ([place]) -     OLANZapine (ZYPREXA) 10 MG tablet; Take 1 tablet (10 mg total) by mouth at bedtime.  Posttraumatic stress disorder  Primary insomnia     Please  see After Visit Summary for patient specific instructions.  Future Appointments  Date Time Provider [place]  04/27/2022 10:00 AM GI-BCG MM 3 GI-BCGMM GI-BREAST CE  05/17/2022 11:20 AM Copland, Gay Filler, MD LBPC-SW PEC  05/17/2022  1:30 PM LBGI-LEC PREVISIT RM 50 LBGI-LEC LBPCEndo  05/18/2022 11:00 AM Shanon Ace, LCSW CP-CP None  05/20/2022 10:15 AM [name], PMHNP CP-CP None  05/24/2022  3:20 PM  Copland, Gay Filler, MD LBPC-SW PEC  06/07/2022 11:00 AM Shanon Ace, LCSW CP-CP None  06/14/2022 10:30 AM Mauri Pole, MD LBGI-LEC LBPCEndo  06/28/2022 11:00 AM Shanon Ace, LCSW CP-CP None  07/26/2022 11:00 AM Shanon Ace, LCSW CP-CP None  09/30/2022 10:00 AM Cameron Sprang, MD LBN-LBNG None    No orders of the defined types were placed in this encounter.   -------------------------------

## 2022-04-27 ENCOUNTER — Ambulatory Visit
Admission: RE | Admit: 2022-04-27 | Discharge: 2022-04-27 | Disposition: A | Discharge status: Home / Self Care | Payer: Medicare Other | Source: Ambulatory Visit | Attending: Obstetrics and Gynecology | Admitting: Obstetrics and Gynecology

## 2022-04-27 DIAGNOSIS — Z1231 Encounter for screening mammogram for malignant neoplasm of breast: Secondary | ICD-10-CM

## 2022-05-15 NOTE — Patient Instructions (Incomplete)
Good to see you again today- I will be in touch with your labs asap  Please go to [place] at [facility] at your convenience for chest x-ray Flu and pneumonia vaccine today Recommend covid booster and RSV this fall at your pharmacy Try adding an antifungal cream some as clotrimazole to finish healing up the rash on your chest

## 2022-05-15 NOTE — Progress Notes (Unsigned)
[place] at [facility] [address] [place], [place] [address] [phone]  Date:  05/17/2022   Name:  Theresa [name]   DOB:  March 23, 1956   MRN:  [id number]  PCP:  Darreld Mclean, MD    Chief Complaint: No chief complaint on file.   History of Present Illness:  Theresa [name] is a 66 y.o. very pleasant female patient who presents with the following:  Pt seen today for a CPE- History of bipolar disorder and epilepsy, tardive dyskinesia, dyslipidemia Last seen by myself 12/22-   She is cared for by [facility]  Covid booster Flu shot  Pneumonia vaccine Update labs today Mammo UTD Colon Dexa one year ago - normal    Lovastatin Metformin protonix  Patient Active Problem List   Diagnosis Date Noted   Dyslipidemia 05/05/2020   Manic bipolar I disorder in partial remission ([place]) 07/25/2018   Posttraumatic stress disorder 07/25/2018   Generalized idiopathic epilepsy and epileptic syndromes, not intractable, without status epilepticus ([place]) 06/24/2017   Tardive dyskinesia 01/21/2017   Special screening for malignant neoplasms, colon 06/16/2012   Personal history of colonic polyps 06/16/2012   Bipolar disorder ([place]) 04/02/2012   Idiopathic generalized epilepsy ([place]) 10/15/2011    Past Medical History:  Diagnosis Date   Adenomatous colon polyp    Allergy    Anxiety    Bipolar 1 disorder ([place])    Blood transfusion without reported diagnosis    Depression    Elevated LFTs    Hepatic steatosis 05/07/13   Hyperlipidemia    Seizures ([place])    Tardive dyskinesia     Past Surgical History:  Procedure Laterality Date   APPENDECTOMY     COLONOSCOPY  06/16/2012   Procedure: COLONOSCOPY;  Surgeon: Lafayette Dragon, MD;  Location: [facility];  Service: Endoscopy;  Laterality: N/A;   DILATION AND CURETTAGE OF UTERUS     KNEE ARTHROSCOPY WITH MENISCAL REPAIR Left    NECK SURGERY      Social History    Tobacco Use   Smoking status: Never   Smokeless tobacco: Never  Vaping Use   Vaping Use: Never used  Substance Use Topics   Alcohol use: Yes    Comment: occasionaly 1 a month   Drug use: No    Family History  Problem Relation Age of Onset   Colon polyps Mother    Cancer Mother    Heart disease Mother    Alcoholism Mother    Cancer Father    Alcoholism Father    Bipolar disorder Father    Drug abuse Brother    Bipolar disorder Other    Colon cancer Neg Hx    Stomach cancer Neg Hx    Breast cancer Neg Hx     Allergies  Allergen Reactions   Lamictal [Lamotrigine]     Acute renal failure   Ropinirole Nausea And Vomiting   Tramadol    Codeine Swelling and Rash    Can take hydrocodone   Penicillins     Abdominal pain    Medication list has been reviewed and updated.  Current Outpatient Medications on File Prior to Visit  Medication Sig Dispense Refill   ALPRAZolam (XANAX XR) 2 MG 24 hr tablet TAKE 1 TABLET BY MOUTH IN THE  MORNING AND AT BEDTIME 180 tablet 0   zolpidem (AMBIEN) 10 MG tablet TAKE 1 TABLET BY MOUTH AT  BEDTIME AS NEEDED 30 tablet  5   ALPRAZolam (XANAX) 1 MG tablet TAKE 1 TABLET BY MOUTH DAILY AS  NEEDED FOR SEVERE ANXIETY 30 tablet 3   carbamazepine (CARBATROL) 300 MG 12 hr capsule TAKE 1 CAPSULE BY MOUTH IN  THE MORNING AND 2 CAPSULES  BY MOUTH AT BEDTIME 300 capsule 2   cetirizine (ZYRTEC) 10 MG tablet Take 10 mg by mouth daily as needed.      clonazePAM (KLONOPIN) 1 MG tablet TAKE 1 TABLET BY MOUTH AT  BEDTIME 30 tablet 3   COVID-19 mRNA bivalent vaccine, Pfizer, injection Inject into the muscle. 0.3 mL 0   COVID-19 mRNA vaccine, Moderna, (MODERNA COVID-19 VACCINE) 100 MCG/0.5ML injection Inject into the muscle. 0.25 mL 0   Deutetrabenazine (AUSTEDO) 12 MG TABS Take 1 tablet po BID with a 9 mg tablet to equal 21 mg po BID 60 tablet 5   Deutetrabenazine (AUSTEDO) 9 MG TABS Take 1 tab po BID with a 12 mg tab to equal 21 mg BID 60 tablet 5   docusate  sodium (COLACE) 50 MG capsule Take 50 mg by mouth daily as needed for mild constipation.     fluticasone (FLONASE) 50 MCG/ACT nasal spray Place into both nostrils daily as needed.      levETIRAcetam (KEPPRA) 500 MG tablet Take 1 tablet (500 mg total) by mouth 2 (two) times daily. 180 tablet 3   lovastatin (MEVACOR) 20 MG tablet TAKE 1 TABLET BY MOUTH DAILY 90 tablet 3   Melatonin 10 MG TABS Take 1 tablet by mouth at bedtime. Reported on 01/30/2016     metFORMIN (GLUCOPHAGE) 500 MG tablet TAKE 1 TABLET BY MOUTH TWICE  DAILY WITH A MEAL 180 tablet 3   Multiple Vitamins-Minerals (CENTRUM SILVER PO) Take by mouth.     nitrofurantoin, macrocrystal-monohydrate, (MACROBID) 100 MG capsule Take 1 capsule (100 mg total) by mouth 2 (two) times daily. (Patient not taking: Reported on 09/01/2021) 14 capsule 0   OLANZapine (ZYPREXA) 10 MG tablet Take 1 tablet (10 mg total) by mouth at bedtime. 30 tablet 0   pantoprazole (PROTONIX) 40 MG tablet TAKE 1 TABLET BY MOUTH DAILY AS  NEEDED 90 tablet 3   polyethylene glycol (MIRALAX / GLYCOLAX) 17 g packet Take 17 g by mouth daily as needed.     traZODone (DESYREL) 100 MG tablet Take 1-2 tablets (100-200 mg total) by mouth at bedtime as needed. for sleep 180 tablet 3   No current facility-administered medications on file prior to visit.    Review of Systems:  As per HPI- otherwise negative.   Physical Examination: There were no vitals filed for this visit. There were no vitals filed for this visit. There is no height or weight on file to calculate BMI. Ideal Body Weight:    GEN: no acute distress. HEENT: Atraumatic, Normocephalic.  Ears and Nose: No external deformity. CV: RRR, No M/G/R. No JVD. No thrill. No extra heart sounds. PULM: CTA B, no wheezes, crackles, rhonchi. No retractions. No resp. distress. No accessory muscle use. ABD: S, NT, ND, +BS. No rebound. No HSM. EXTR: No c/c/e PSYCH: Normally interactive. Conversant.    Assessment and  Plan: *** Physical exam today- Will plan further follow- up pending labs.  Signed Lamar Blinks, MD

## 2022-05-17 ENCOUNTER — Ambulatory Visit (AMBULATORY_SURGERY_CENTER): Payer: Self-pay

## 2022-05-17 ENCOUNTER — Ambulatory Visit (INDEPENDENT_AMBULATORY_CARE_PROVIDER_SITE_OTHER): Payer: Medicare Other | Admitting: Family Medicine

## 2022-05-17 ENCOUNTER — Encounter: Payer: Self-pay | Admitting: Family Medicine

## 2022-05-17 VITALS — BP 132/82 | HR 88 | Temp 97.8°F | Resp 18 | Ht 66.0 in | Wt 184.4 lb

## 2022-05-17 VITALS — Ht 66.0 in | Wt 185.0 lb

## 2022-05-17 DIAGNOSIS — Z0000 Encounter for general adult medical examination without abnormal findings: Secondary | ICD-10-CM

## 2022-05-17 DIAGNOSIS — R053 Chronic cough: Secondary | ICD-10-CM | POA: Diagnosis not present

## 2022-05-17 DIAGNOSIS — Z1329 Encounter for screening for other suspected endocrine disorder: Secondary | ICD-10-CM | POA: Diagnosis not present

## 2022-05-17 DIAGNOSIS — Z23 Encounter for immunization: Secondary | ICD-10-CM

## 2022-05-17 DIAGNOSIS — M545 Low back pain, unspecified: Secondary | ICD-10-CM

## 2022-05-17 DIAGNOSIS — Z131 Encounter for screening for diabetes mellitus: Secondary | ICD-10-CM | POA: Diagnosis not present

## 2022-05-17 DIAGNOSIS — Z130 Encounter for screening for diseases of the blood and blood-forming organs and certain disorders involving the immune mechanism: Secondary | ICD-10-CM | POA: Diagnosis not present

## 2022-05-17 DIAGNOSIS — E785 Hyperlipidemia, unspecified: Secondary | ICD-10-CM

## 2022-05-17 DIAGNOSIS — B372 Candidiasis of skin and nail: Secondary | ICD-10-CM | POA: Diagnosis not present

## 2022-05-17 DIAGNOSIS — Z1211 Encounter for screening for malignant neoplasm of colon: Secondary | ICD-10-CM

## 2022-05-17 LAB — COMPREHENSIVE METABOLIC PANEL
ALT: 31 U/L (ref 0–35)
AST: 19 U/L (ref 0–37)
Albumin: 4.6 g/dL (ref 3.5–5.2)
Alkaline Phosphatase: 123 U/L — ABNORMAL HIGH (ref 39–117)
BUN: 10 mg/dL (ref 6–23)
CO2: 28 mEq/L (ref 19–32)
Calcium: 9.4 mg/dL (ref 8.4–10.5)
Chloride: 97 mEq/L (ref 96–112)
Creatinine, Ser: 0.72 mg/dL (ref 0.40–1.20)
GFR: 87.31 mL/min (ref >60.00)
Glucose, Bld: 99 mg/dL (ref 70–99)
Potassium: 4.8 mEq/L (ref 3.5–5.1)
Sodium: 136 mEq/L (ref 135–145)
Total Bilirubin: 0.3 mg/dL (ref 0.2–1.2)
Total Protein: 7 g/dL (ref 6.0–8.3)

## 2022-05-17 LAB — LIPID PANEL
Cholesterol: 204 mg/dL — ABNORMAL HIGH (ref 0–200)
HDL: 74.5 mg/dL (ref >39.00)
NonHDL: 129.17
Total CHOL/HDL Ratio: 3
Triglycerides: 234 mg/dL — ABNORMAL HIGH (ref 0.0–149.0)
VLDL: 46.8 mg/dL — ABNORMAL HIGH (ref 0.0–40.0)

## 2022-05-17 LAB — CBC
HCT: 40.1 % (ref 36.0–46.0)
Hemoglobin: 13.5 g/dL (ref 12.0–15.0)
MCHC: 33.6 g/dL (ref 30.0–36.0)
MCV: 88 fl (ref 78.0–100.0)
Platelets: 247 10*3/uL (ref 150.0–400.0)
RBC: 4.56 Mil/uL (ref 3.87–5.11)
RDW: 13.9 % (ref 11.5–15.5)
WBC: 6.5 10*3/uL (ref 4.0–10.5)

## 2022-05-17 LAB — TSH: TSH: 2.31 u[IU]/mL (ref 0.35–5.50)

## 2022-05-17 LAB — LDL CHOLESTEROL, DIRECT: Direct LDL: 104 mg/dL

## 2022-05-17 MED ORDER — NA SULFATE-K SULFATE-MG SULF 17.5-3.13-1.6 GM/177ML PO SOLN: 1.0000 | Freq: Once | ORAL | 0 refills | Status: AC

## 2022-05-17 MED ORDER — METHOCARBAMOL 500 MG PO TABS: 500.0000 mg | ORAL_TABLET | Freq: Four times a day (QID) | ORAL | 1 refills | Status: DC

## 2022-05-17 MED ORDER — TIZANIDINE HCL 2 MG PO TABS: 2.0000 mg | ORAL_TABLET | Freq: Four times a day (QID) | ORAL | 1 refills | Status: DC | PRN

## 2022-05-17 NOTE — Progress Notes (Signed)
No egg or soy allergy known to patient    No issues known to pt with past sedation with any surgeries or procedures  Patient denies ever being told they had issues or difficulty with intubation   No FH of Malignant Hyperthermia Pt is not on diet pills Pt is not on  home 02  Pt is not on blood thinners   Pt has issues with constipation, states due to her medications, pt to take miralax 1 capful daily or twice daily for 5 days before the procedure to have a daily bm, pt and spouse verb understanding.   No A fib or A flutter  Have any cardiac testing pending--no  Pt instructed to use [email or web address] or GoodRx for a price reduction on prep

## 2022-05-18 ENCOUNTER — Encounter: Payer: Self-pay | Admitting: Family Medicine

## 2022-05-18 ENCOUNTER — Ambulatory Visit
Admission: RE | Admit: 2022-05-18 | Discharge: 2022-05-18 | Disposition: A | Discharge status: Home / Self Care | Payer: Medicare Other | Source: Ambulatory Visit | Attending: Family Medicine | Admitting: Family Medicine

## 2022-05-18 ENCOUNTER — Ambulatory Visit: Payer: Medicare Other | Admitting: Psychiatry

## 2022-05-18 DIAGNOSIS — R053 Chronic cough: Secondary | ICD-10-CM | POA: Diagnosis not present

## 2022-05-18 DIAGNOSIS — F319 Bipolar disorder, unspecified: Secondary | ICD-10-CM

## 2022-05-18 DIAGNOSIS — R748 Abnormal levels of other serum enzymes: Secondary | ICD-10-CM

## 2022-05-18 LAB — HEMOGLOBIN A1C: Hgb A1c MFr Bld: 5.8 % (ref 4.6–6.5)
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
## 2022-05-20 ENCOUNTER — Encounter: Payer: Self-pay | Admitting: Psychiatry

## 2022-05-20 ENCOUNTER — Ambulatory Visit (INDEPENDENT_AMBULATORY_CARE_PROVIDER_SITE_OTHER): Payer: Medicare Other | Admitting: Psychiatry

## 2022-05-20 DIAGNOSIS — F4310 Post-traumatic stress disorder, unspecified: Secondary | ICD-10-CM | POA: Diagnosis not present

## 2022-05-20 DIAGNOSIS — G40309 Generalized idiopathic epilepsy and epileptic syndromes, not intractable, without status epilepticus: Secondary | ICD-10-CM | POA: Diagnosis not present

## 2022-05-20 DIAGNOSIS — F319 Bipolar disorder, unspecified: Secondary | ICD-10-CM

## 2022-05-20 DIAGNOSIS — F5101 Primary insomnia: Secondary | ICD-10-CM

## 2022-05-20 DIAGNOSIS — G2401 Drug induced subacute dyskinesia: Secondary | ICD-10-CM

## 2022-05-20 MED ORDER — AUSTEDO 12 MG PO TABS: ORAL_TABLET | ORAL | 5 refills | Status: DC

## 2022-05-20 MED ORDER — ALPRAZOLAM ER 2 MG PO TB24: ORAL_TABLET | ORAL | 1 refills | Status: DC

## 2022-05-20 MED ORDER — ALPRAZOLAM 1 MG PO TABS: ORAL_TABLET | ORAL | 3 refills | Status: DC

## 2022-05-20 MED ORDER — OLANZAPINE 7.5 MG PO TABS: 7.5000 mg | ORAL_TABLET | Freq: Every day | ORAL | 0 refills | Status: DC

## 2022-05-20 MED ORDER — CLONAZEPAM 1 MG PO TABS: 1.0000 mg | ORAL_TABLET | Freq: Every day | ORAL | 5 refills | Status: DC

## 2022-05-20 MED ORDER — AUSTEDO 9 MG PO TABS: ORAL_TABLET | ORAL | 5 refills | Status: DC

## 2022-05-20 NOTE — Progress Notes (Signed)
Theresa [name] [id number] 05/03/1956 66 y.o.  Subjective:   Patient ID:  Theresa [name] is a 66 y.o. (DOB [birth year]) female.  Chief Complaint:  Chief Complaint  Patient presents with   Follow-up    Anxiety, Bipolar Disorder, and insomnia    HPI Theresa [name] presents to the office today for follow-up of anxiety, Bipolar D/O, and insomnia. Theresa [name] reports "I think I am evened out... I think the 7.5 mg is the right dose." Theresa [name] reports that her sleep has improved overall. Theresa [name] reports that Theresa [name] has 2-3 nights a week when her sleep is fragmented.   Her husband reports that her manic s/s are consistent with her baseline. He notices Theresa [name] will occasionally seem to get overly stimulated in grocery stores and other environments.   Theresa [name] went on a trip to [place] and enjoyed it. Theresa [name] reports "there was a night I didn't sleep much at all" and felt sick the next day and rested. Theresa [name] also used Xanax prn more during her trip.   Theresa [name] reports that her mood has been stable. Anxiety has been controlled. Energy and motivation are low and attributes this to coming back from trip this past weekend. Appetite has been good. Concentration has been ok. Theresa [name] has been able to concentrate on a book Theresa [name] is reading. Denies SI.   Theresa [name] reports that her tremor has been "not so great." Theresa [name] reports, "I'm trying to concentrate on what I can do and not on what I can't do."   Xanax last filled 04/29/22. Ambien last filled 04/21/22 Klonopin last filled 8/9 x 2  Past Psychiatric Medication Trials: Olanzapine Seroquel Saphris Depakote-self-injurious behavior Lamictal Keppra Carbamazepine Xanax Klonopin Ambien Trazodone Benztropine Ingrezza Propranolol- Ineffective  AIMS    Flowsheet Row Office Visit from 05/20/2022 in [place] Visit from 10/13/2021 in [place] Visit from 08/04/2021 in [place] Visit from 03/09/2021 in [place] Visit from 09/30/2020 in [place] Total Score '8 6 9 10 9      '$ [place] Office Visit from 02/28/2017 in [place] at [place]  Total Score (max 30 points ) 29      PHQ2-9    [place] Visit from 05/17/2022 in [place] at [place] Visit from 02/28/2017 in [place] at [place] Visit from 09/19/2015 in [facility] at [place] from 06/05/2015 in [place] at [place] from 03/10/2015 in [facility] at [facility] Total Score 1 0 0 0 0        Review of Systems:  Review of Systems  Musculoskeletal:  Negative for gait problem.  Neurological:  Positive for tremors.  Psychiatric/Behavioral:         Please refer to HPI     Had recent elevated Alkaline Phosphatase.   Medications: I have reviewed the patient's current medications.  Current Outpatient Medications  Medication Sig Dispense Refill   [START ON 07/14/2022] ALPRAZolam (XANAX XR) 2 MG 24 hr tablet TAKE 1 TABLET BY MOUTH IN THE  MORNING AND AT BEDTIME 180 tablet 1   [START ON 06/24/2022] ALPRAZolam (XANAX) 1 MG tablet TAKE 1 TABLET BY MOUTH DAILY AS  NEEDED FOR SEVERE ANXIETY 30 tablet 3   carbamazepine (CARBATROL) 300 MG 12 hr capsule TAKE 1 CAPSULE BY MOUTH IN  THE MORNING AND 2 CAPSULES  BY MOUTH AT BEDTIME 300 capsule 2   cetirizine (ZYRTEC) 10 MG tablet Take 10 mg by mouth daily as needed.      [START ON 06/17/2022] clonazePAM (KLONOPIN) 1 MG tablet Take 1 tablet (1 mg total) by mouth at bedtime. 30 tablet 5   Deutetrabenazine (AUSTEDO) 12 MG TABS Take 1 tablet po BID with a 9 mg tablet to equal 21 mg po BID 60 tablet 5   Deutetrabenazine (AUSTEDO) 9 MG TABS Take 1 tab po BID with a 12 mg tab to equal 21 mg BID 60 tablet 5   docusate sodium (COLACE) 50 MG capsule Take 50 mg by mouth daily as needed for mild constipation.      fluticasone (FLONASE) 50 MCG/ACT nasal spray Place into both nostrils daily as needed.      levETIRAcetam (KEPPRA) 500 MG tablet Take 1 tablet (500 mg total) by mouth 2 (two) times daily. 180 tablet 3   lovastatin (MEVACOR) 20 MG tablet TAKE 1 TABLET BY MOUTH DAILY 90 tablet 3   Melatonin 10 MG TABS Take 1 tablet by mouth at bedtime. Reported on 01/30/2016     metFORMIN (GLUCOPHAGE) 500 MG tablet TAKE 1 TABLET BY MOUTH TWICE  DAILY WITH A MEAL 180 tablet 3   Multiple Vitamins-Minerals (CENTRUM SILVER PO) Take by mouth.     OLANZapine (ZYPREXA) 7.5 MG tablet Take 1 tablet (7.5 mg total) by mouth at bedtime. 90 tablet 0   pantoprazole (PROTONIX) 40 MG tablet TAKE 1 TABLET BY MOUTH DAILY AS  NEEDED 90 tablet 3   polyethylene glycol (MIRALAX / GLYCOLAX) 17 g packet Take 17 g by mouth daily as needed.     tiZANidine (ZANAFLEX) 2 MG tablet Take 1 tablet (2 mg total) by mouth every 6 (six) hours as needed for muscle spasms. 30 tablet 1   traZODone (DESYREL) 100 MG tablet Take 1-2 tablets (100-200 mg total) by mouth at bedtime as needed. for sleep 180 tablet 3   zolpidem (AMBIEN) 10 MG tablet TAKE 1 TABLET BY MOUTH AT  BEDTIME AS NEEDED 30 tablet 5   No current facility-administered medications for this visit.    Medication Side Effects: Other: Tremor  Allergies:  Allergies  Allergen Reactions   Cyclobenzaprine     Blisters in mouth   Lamictal [Lamotrigine]     Acute renal failure   Ropinirole Nausea And Vomiting   Tramadol    Acetaminophen Itching, Other (See Comments), Rash and Swelling   Codeine Swelling and Rash    Can take hydrocodone   Penicillins     Abdominal pain    Past Medical History:  Diagnosis Date   Adenomatous colon polyp    Allergy    Anxiety    Bipolar 1 disorder ([place])    Blood transfusion without reported diagnosis    Depression    Elevated LFTs    GERD (gastroesophageal reflux disease)    Hepatic steatosis 05/07/2013   Hyperlipidemia    Seizures ([place]) 1977    Last seizure 01/2020   Tardive dyskinesia     Past Medical History, Surgical history, Social history, and Family history were reviewed and updated as appropriate.   Please see review of systems for further details on the patient's review from today.   Objective:   Physical Exam:  There were no vitals taken for this visit.  Physical Exam Constitutional:      General: Theresa [name] is not in acute distress. Musculoskeletal:        General: No  deformity.  Neurological:     Mental Status: Theresa [name] is alert and oriented to person, place, and time.     Coordination: Coordination normal.  Psychiatric:        Attention and Perception: Attention and perception normal. Theresa [name] does not perceive auditory or visual hallucinations.        Mood and Affect: Mood normal. Mood is not anxious or depressed. Affect is not labile, blunt, angry or inappropriate.        Speech: Speech normal.        Behavior: Behavior normal.        Thought Content: Thought content normal. Thought content is not paranoid or delusional. Thought content does not include homicidal or suicidal ideation. Thought content does not include homicidal or suicidal plan.        Cognition and Memory: Cognition normal.        Judgment: Judgment normal.     Comments: Insight intact Mild memory impairment     Lab Review:     Component Value Date/Time   NA 136 05/17/2022 1147   K 4.8 05/17/2022 1147   CL 97 05/17/2022 1147   CO2 28 05/17/2022 1147   GLUCOSE 99 05/17/2022 1147   BUN 10 05/17/2022 1147   CREATININE 0.72 05/17/2022 1147   CREATININE 0.79 05/08/2020 1015   CALCIUM 9.4 05/17/2022 1147   PROT 7.0 05/17/2022 1147   ALBUMIN 4.6 05/17/2022 1147   AST 19 05/17/2022 1147   ALT 31 05/17/2022 1147   ALKPHOS 123 (H) 05/17/2022 1147   BILITOT 0.3 05/17/2022 1147       Component Value Date/Time   WBC 6.5 05/17/2022 1147   RBC 4.56 05/17/2022 1147   HGB 13.5 05/17/2022 1147   HCT 40.1 05/17/2022 1147   PLT 247.0 05/17/2022 1147    MCV 88.0 05/17/2022 1147   MCV 87.7 02/07/2013 1110   MCH 29.8 05/08/2020 1015   MCHC 33.6 05/17/2022 1147   RDW 13.9 05/17/2022 1147    No results found for: "POCLITH", "LITHIUM"   Lab Results  Component Value Date   CBMZ 5.2 02/01/2019     .res Assessment: Plan:    Pt seen for 30 minutes and time spent discussing response to recent adjustments in Olanzapine. Pt reports that Olanzapine 7.5 mg po QHS seems to be the optimal dose in terms of balancing risks and benefits, particularly since Theresa [name] notices breakthrough manic s/s at lower dose.  Theresa [name] reports that her alkaline phosphatase was recently abnormal and that Theresa [name] has an ultrasound of her liver scheduled. Theresa [name] asks if Metformin could have contributed to this. Discussed that this seems unlikely since Theresa [name] has had abnormal alkaline phosphatase levels for several years prior to starting Metformin.  Continue Metformin 500 mg twice daily for antipsychotic-induced weight gain.  Continue Carbamazepine 300 mg in the morning and 600 mg at bedtime for mood stabilization. Continue Xanax XR 2 mg po BID for anxiety.  Continue Xanax 1 mg as needed for severe anxiety.  Continue Klonopin 1 mg at bedtime for insomnia.  Continue [place] 21 mg po BID for TD. Continue Trazodone 100-200 mg po QHS prn insomnia.  Continue Ambien 10 mg po QHS prn insomnia.  Recommend continuing therapy with Theresa Cloud, Theresa [name].  Pt to follow-up in 2 months or sooner if clinically indicated.  Patient advised to contact office with any questions, adverse effects, or acute worsening in signs and symptoms.   Theresa [name] was seen today for follow-up.  Diagnoses and all orders for this  visit:  Bipolar affective disorder, remission status unspecified (HCC) -     OLANZapine (ZYPREXA) 7.5 MG tablet; Take 1 tablet (7.5 mg total) by mouth at bedtime.  Generalized idiopathic epilepsy and epileptic syndromes, not intractable, without status epilepticus (HCC)  Posttraumatic stress  disorder -     ALPRAZolam (XANAX XR) 2 MG 24 hr tablet; TAKE 1 TABLET BY MOUTH IN THE  MORNING AND AT BEDTIME -     ALPRAZolam (XANAX) 1 MG tablet; TAKE 1 TABLET BY MOUTH DAILY AS  NEEDED FOR SEVERE ANXIETY -     clonazePAM (KLONOPIN) 1 MG tablet; Take 1 tablet (1 mg total) by mouth at bedtime.  Primary insomnia -     clonazePAM (KLONOPIN) 1 MG tablet; Take 1 tablet (1 mg total) by mouth at bedtime.  Tardive dyskinesia -     Deutetrabenazine (AUSTEDO) 12 MG TABS; Take 1 tablet po BID with a 9 mg tablet to equal 21 mg po BID -     Deutetrabenazine (AUSTEDO) 9 MG TABS; Take 1 tab po BID with a 12 mg tab to equal 21 mg BID     Please see After Visit Summary for patient specific instructions.  Future Appointments  Date Time Provider [place]  05/25/2022 10:30 AM [facility] [place] 1 [id number] [facility] [address] [facility]  05/27/2022  3:00 PM [facility] HEALTH COACH [facility]  06/07/2022 11:00 AM Shanon Ace, Theresa [name] CP-CP None  06/14/2022 10:30 AM Mauri Pole, MD [facility]  06/28/2022 11:00 AM Shanon Ace, Theresa [name] CP-CP None  07/13/2022 10:45 AM [name], PMHNP CP-CP None  07/26/2022 11:00 AM Shanon Ace, Theresa [name] CP-CP None  09/30/2022 10:00 AM Cameron Sprang, MD L[facility] None    No orders of the defined types were placed in this encounter.   -------------------------------

## 2022-05-24 ENCOUNTER — Encounter: Payer: Medicare Other | Admitting: Family Medicine

## 2022-05-24 ENCOUNTER — Telehealth: Payer: Self-pay | Admitting: *Deleted

## 2022-05-24 ENCOUNTER — Other Ambulatory Visit: Payer: Self-pay

## 2022-05-24 ENCOUNTER — Telehealth: Payer: Self-pay | Admitting: Psychiatry

## 2022-05-24 ENCOUNTER — Encounter: Payer: Self-pay | Admitting: *Deleted

## 2022-05-24 DIAGNOSIS — F4310 Post-traumatic stress disorder, unspecified: Secondary | ICD-10-CM

## 2022-05-24 DIAGNOSIS — F5101 Primary insomnia: Secondary | ICD-10-CM

## 2022-05-24 MED ORDER — ZOLPIDEM TARTRATE 10 MG PO TABS: 10.0000 mg | ORAL_TABLET | Freq: Every evening | ORAL | 0 refills | Status: DC | PRN

## 2022-05-24 NOTE — Telephone Encounter (Signed)
Pended.

## 2022-05-24 NOTE — Telephone Encounter (Signed)
Next visit is 07/13/22. [name] forgot to order her Zolpidem Tartrate and will run out of it in two days. Can she get a weeks worth of it called to:  [id number] [place] Phone:  [phone] Fax:  [phone]

## 2022-05-24 NOTE — Patient Outreach (Addendum)
  Care Coordination   Initial Visit Note   05/24/2022 Name: Theresa [name] MRN: [id number] DOB: Nov 26, 1955  Theresa [name] Staff is a 65 y.o. year old female who sees Copland, Gay Filler, MD for primary care. I spoke with  Theresa [name] by phone today.  What matters to the patients health and wellness today?  "I'm doing just fine; I will go have my ultrasound tomorrow; and then have my colonoscopy at the end of the month, and after that I should be caught up; I am doing everything the docs tell me to and things are going fine.  I don't have any questions or concerns or needs right now"  No further or ongoing care coordination needs identified today   Goals Addressed             This Visit's Progress    COMPLETED: Care Coordination Activities: No follow up required   On track    Care Coordination Interventions: Evaluation of current treatment plan related to epilepsy, HLD, bipolar disorder and patient's adherence to plan as established by provider Reviewed medications with patient and discussed adherence: patient endorses adherence to all prescribed medications Provided patient and/or caregiver with basic information about resources available to her in community: she denies need for resources today (community resource) Reviewed scheduled/upcoming provider appointments including 05/25/22- ultrasound liver; 05/27/22- Medicare Annual Wellness visit- by telephone; 06/14/22- colonoscopy  Assessed social determinant of health barriers Confirmed patient obtained flu vaccine for 2023-24 flu/ winter season; discussed upcoming colonoscopy- patient denies need for information/ education, reports has had a colonoscopy before and feels comfortable with procedure; denies questions           SDOH assessments and interventions completed:  Yes  SDOH Interventions Today    Flowsheet Row Most Recent Value  SDOH Interventions   Food Insecurity Interventions Intervention Not Indicated  Transportation  Interventions Intervention Not Indicated  [husband provides transportation]        Care Coordination Interventions Activated:  Yes  Care Coordination Interventions:  Yes, provided   Follow up plan: No further intervention required.   Encounter Outcome:  Pt. Visit Completed   Oneta Rack, RN, BSN, CCRN Alumnus RN CM Care Coordination/ Transition of [place] Management [phone]: direct office

## 2022-05-25 ENCOUNTER — Ambulatory Visit
Admission: RE | Admit: 2022-05-25 | Discharge: 2022-05-25 | Disposition: A | Discharge status: Home / Self Care | Payer: Medicare Other | Source: Ambulatory Visit | Attending: Family Medicine | Admitting: Family Medicine

## 2022-05-25 DIAGNOSIS — R748 Abnormal levels of other serum enzymes: Secondary | ICD-10-CM | POA: Diagnosis not present

## 2022-05-25 DIAGNOSIS — K7689 Other specified diseases of liver: Secondary | ICD-10-CM | POA: Diagnosis not present

## 2022-05-25 DIAGNOSIS — K760 Fatty (change of) liver, not elsewhere classified: Secondary | ICD-10-CM | POA: Diagnosis not present

## 2022-05-27 ENCOUNTER — Ambulatory Visit (INDEPENDENT_AMBULATORY_CARE_PROVIDER_SITE_OTHER): Payer: Medicare Other | Admitting: *Deleted

## 2022-05-27 ENCOUNTER — Encounter: Payer: Self-pay | Admitting: Family Medicine

## 2022-05-27 DIAGNOSIS — Z0000 Encounter for general adult medical examination without abnormal findings: Secondary | ICD-10-CM | POA: Diagnosis not present

## 2022-05-27 NOTE — Patient Instructions (Signed)
[name] , Thank you for taking time to come for your Medicare Wellness Visit. I appreciate your ongoing commitment to your health goals. Please review the following plan we discussed and let me know if I can assist you in the future.   These are the goals we discussed:  Goals      Increase physical activity        This is a list of the screening recommended for you and due dates:  Health Maintenance  Topic Date Due   COVID-19 Vaccine (4 - Moderna series) 09/13/2021   Colon Cancer Screening  06/16/2022   Mammogram  04/24/2023   Tetanus Vaccine  03/01/2027   Pneumonia Vaccine  Completed   Flu Shot  Completed   DEXA scan (bone density measurement)  Completed   Hepatitis C Screening: USPSTF Recommendation to screen - Ages 74-79 yo.  Completed   Zoster (Shingles) Vaccine  Completed   HPV Vaccine  Aged Out     Next appointment: Follow up in one year for your annual wellness visit    Preventive Care 65 Years and Older, Female Preventive care refers to lifestyle choices and visits with your health care provider that can promote health and wellness. What does preventive care include? A yearly physical exam. This is also called an annual well check. Dental exams once or twice a year. Routine eye exams. Ask your health care provider how often you should have your eyes checked. Personal lifestyle choices, including: Daily care of your teeth and gums. Regular physical activity. Eating a healthy diet. Avoiding tobacco and drug use. Limiting alcohol use. Practicing safe sex. Taking low-dose aspirin every day. Taking vitamin and mineral supplements as recommended by your health care provider. What happens during an annual well check? The services and screenings done by your health care provider during your annual well check will depend on your age, overall health, lifestyle risk factors, and family history of disease. Counseling  Your health care provider may ask you questions about  your: Alcohol use. Tobacco use. Drug use. Emotional well-being. Home and relationship well-being. Sexual activity. Eating habits. History of falls. Memory and ability to understand (cognition). Work and work Statistician. Reproductive health. Screening  You may have the following tests or measurements: Height, weight, and BMI. Blood pressure. Lipid and cholesterol levels. These may be checked every 5 years, or more frequently if you are over 59 years old. Skin check. Lung cancer screening. You may have this screening every year starting at age 81 if you have a 30-pack-year history of smoking and currently smoke or have quit within the past 15 years. Fecal occult blood test (FOBT) of the stool. You may have this test every year starting at age 54. Flexible sigmoidoscopy or colonoscopy. You may have a sigmoidoscopy every 5 years or a colonoscopy every 10 years starting at age 83. Hepatitis C blood test. Hepatitis B blood test. Sexually transmitted disease (STD) testing. Diabetes screening. This is done by checking your blood sugar (glucose) after you have not eaten for a while (fasting). You may have this done every 1-3 years. Bone density scan. This is done to screen for osteoporosis. You may have this done starting at age 68. Mammogram. This may be done every 1-2 years. Talk to your health care provider about how often you should have regular mammograms. Talk with your health care provider about your test results, treatment options, and if necessary, the need for more tests. Vaccines  Your health care provider may recommend  certain vaccines, such as: Influenza vaccine. This is recommended every year. Tetanus, diphtheria, and acellular pertussis (Tdap, Td) vaccine. You may need a Td booster every 10 years. Zoster vaccine. You may need this after age 55. Pneumococcal 13-valent conjugate (PCV13) vaccine. One dose is recommended after age 28. Pneumococcal polysaccharide (PPSV23) vaccine.  One dose is recommended after age 67. Talk to your health care provider about which screenings and vaccines you need and how often you need them. This information is not intended to replace advice given to you by your health care provider. Make sure you discuss any questions you have with your health care provider. Document Released: 09/05/2015 Document Revised: 04/28/2016 Document Reviewed: 06/10/2015 Elsevier Interactive Patient Education  2017 [place] Prevention in the Home Falls can cause injuries. They can happen to people of all ages. There are many things you can do to make your home safe and to help prevent falls. What can I do on the outside of my home? Regularly fix the edges of walkways and driveways and fix any cracks. Remove anything that might make you trip as you walk through a door, such as a raised step or threshold. Trim any bushes or trees on the path to your home. Use bright outdoor lighting. Clear any walking paths of anything that might make someone trip, such as rocks or tools. Regularly check to see if handrails are loose or broken. Make sure that both sides of any steps have handrails. Any raised decks and porches should have guardrails on the edges. Have any leaves, snow, or ice cleared regularly. Use sand or salt on walking paths during winter. Clean up any spills in your garage right away. This includes oil or grease spills. What can I do in the bathroom? Use night lights. Install grab bars by the toilet and in the tub and shower. Do not use towel bars as grab bars. Use non-skid mats or decals in the tub or shower. If you need to sit down in the shower, use a plastic, non-slip stool. Keep the floor dry. Clean up any water that spills on the floor as soon as it happens. Remove soap buildup in the tub or shower regularly. Attach bath mats securely with double-sided non-slip rug tape. Do not have throw rugs and other things on the floor that can make  you trip. What can I do in the bedroom? Use night lights. Make sure that you have a light by your bed that is easy to reach. Do not use any sheets or blankets that are too big for your bed. They should not hang down onto the floor. Have a firm chair that has side arms. You can use this for support while you get dressed. Do not have throw rugs and other things on the floor that can make you trip. What can I do in the kitchen? Clean up any spills right away. Avoid walking on wet floors. Keep items that you use a lot in easy-to-reach places. If you need to reach something above you, use a strong step stool that has a grab bar. Keep electrical cords out of the way. Do not use floor polish or wax that makes floors slippery. If you must use wax, use non-skid floor wax. Do not have throw rugs and other things on the floor that can make you trip. What can I do with my stairs? Do not leave any items on the stairs. Make sure that there are handrails on both sides of the  stairs and use them. Fix handrails that are broken or loose. Make sure that handrails are as long as the stairways. Check any carpeting to make sure that it is firmly attached to the stairs. Fix any carpet that is loose or worn. Avoid having throw rugs at the top or bottom of the stairs. If you do have throw rugs, attach them to the floor with carpet tape. Make sure that you have a light switch at the top of the stairs and the bottom of the stairs. If you do not have them, ask someone to add them for you. What else can I do to help prevent falls? Wear shoes that: Do not have high heels. Have rubber bottoms. Are comfortable and fit you well. Are closed at the toe. Do not wear sandals. If you use a stepladder: Make sure that it is fully opened. Do not climb a closed stepladder. Make sure that both sides of the stepladder are locked into place. Ask someone to hold it for you, if possible. Clearly mark and make sure that you can  see: Any grab bars or handrails. First and last steps. Where the edge of each step is. Use tools that help you move around (mobility aids) if they are needed. These include: Canes. Walkers. Scooters. Crutches. Turn on the lights when you go into a dark area. Replace any light bulbs as soon as they burn out. Set up your furniture so you have a clear path. Avoid moving your furniture around. If any of your floors are uneven, fix them. If there are any pets around you, be aware of where they are. Review your medicines with your doctor. Some medicines can make you feel dizzy. This can increase your chance of falling. Ask your doctor what other things that you can do to help prevent falls. This information is not intended to replace advice given to you by your health care provider. Make sure you discuss any questions you have with your health care provider. Document Released: 06/05/2009 Document Revised: 01/15/2016 Document Reviewed: 09/13/2014 Elsevier Interactive Patient Education  2017 [employer].

## 2022-05-27 NOTE — Progress Notes (Signed)
Subjective:   Theresa [name] is a 66 y.o. female who presents for Medicare Annual (Subsequent) preventive examination.  I connected with  Theresa [name] on 05/27/22 by a audio enabled telemedicine application and verified that I am speaking with the correct person using two identifiers.  Patient Location: Home  Provider Location: Office/Clinic  I discussed the limitations of evaluation and management by telemedicine. The patient expressed understanding and agreed to proceed.   Review of Systems    Defer to PCP Cardiac Risk Factors include: advanced age (>72mn, >>55women);dyslipidemia;sedentary lifestyle     Objective:    There were no vitals filed for this visit. There is no height or weight on file to calculate BMI.     05/27/2022    3:20 PM 09/29/2021    3:32 PM 09/29/2020    3:28 PM 09/26/2019    4:23 PM 02/19/2019    1:14 PM 02/28/2017    1:38 PM 06/16/2012    9:12 AM  Advanced Directives  Does Patient Have a Medical Advance Directive? Yes Yes Yes Yes Yes Yes Patient has advance directive, copy not in chart  Type of Advance Directive [place] will  [place] will;Out of facility DNR (pink MOST or yellow form) [place]Living will HMountain VillageLiving will HCedar Glen LakesLiving will Living will  Does patient want to make changes to medical advance directive? No - Patient declined        Copy of [place] Chart? No - copy requested     No - copy requested Copy requested from other (Comment)  Pre-existing out of facility DNR order (yellow form or pink MOST form)       No    Current Medications (verified) Outpatient Encounter Medications as of 05/27/2022  Medication Sig   [START ON 07/14/2022] ALPRAZolam (XANAX XR) 2 MG 24 hr tablet TAKE 1 TABLET BY MOUTH IN THE  MORNING AND AT BEDTIME   [START ON 06/24/2022] ALPRAZolam (XANAX) 1 MG tablet TAKE 1 TABLET BY  MOUTH DAILY AS  NEEDED FOR SEVERE ANXIETY   carbamazepine (CARBATROL) 300 MG 12 hr capsule TAKE 1 CAPSULE BY MOUTH IN  THE MORNING AND 2 CAPSULES  BY MOUTH AT BEDTIME   cetirizine (ZYRTEC) 10 MG tablet Take 10 mg by mouth daily as needed.    [START ON 06/17/2022] clonazePAM (KLONOPIN) 1 MG tablet Take 1 tablet (1 mg total) by mouth at bedtime.   Deutetrabenazine (AUSTEDO) 12 MG TABS Take 1 tablet po BID with a 9 mg tablet to equal 21 mg po BID   Deutetrabenazine (AUSTEDO) 9 MG TABS Take 1 tab po BID with a 12 mg tab to equal 21 mg BID   docusate sodium (COLACE) 50 MG capsule Take 50 mg by mouth daily as needed for mild constipation.   fluticasone (FLONASE) 50 MCG/ACT nasal spray Place into both nostrils daily as needed.    levETIRAcetam (KEPPRA) 500 MG tablet Take 1 tablet (500 mg total) by mouth 2 (two) times daily.   lovastatin (MEVACOR) 20 MG tablet TAKE 1 TABLET BY MOUTH DAILY   Melatonin 10 MG TABS Take 1 tablet by mouth at bedtime. Reported on 01/30/2016   metFORMIN (GLUCOPHAGE) 500 MG tablet TAKE 1 TABLET BY MOUTH TWICE  DAILY WITH A MEAL   Multiple Vitamins-Minerals (CENTRUM SILVER PO) Take by mouth.   OLANZapine (ZYPREXA) 7.5 MG tablet Take 1 tablet (7.5 mg total) by mouth at  bedtime.   pantoprazole (PROTONIX) 40 MG tablet TAKE 1 TABLET BY MOUTH DAILY AS  NEEDED   polyethylene glycol (MIRALAX / GLYCOLAX) 17 g packet Take 17 g by mouth daily as needed.   tiZANidine (ZANAFLEX) 2 MG tablet Take 1 tablet (2 mg total) by mouth every 6 (six) hours as needed for muscle spasms.   traZODone (DESYREL) 100 MG tablet Take 1-2 tablets (100-200 mg total) by mouth at bedtime as needed. for sleep   zolpidem (AMBIEN) 10 MG tablet Take 1 tablet (10 mg total) by mouth at bedtime as needed.   No facility-administered encounter medications on file as of 05/27/2022.    Allergies (verified) Cyclobenzaprine, Lamictal [lamotrigine], Ropinirole, Tramadol, Acetaminophen, Codeine, and Penicillins    History: Past Medical History:  Diagnosis Date   Adenomatous colon polyp    Allergy    Anxiety    Bipolar 1 disorder ([place])    Blood transfusion without reported diagnosis    Depression    Elevated LFTs    GERD (gastroesophageal reflux disease)    Hepatic steatosis 05/07/2013   Hyperlipidemia    Seizures ([place]) 1977   Last seizure 01/2020   Tardive dyskinesia    Past Surgical History:  Procedure Laterality Date   APPENDECTOMY     COLONOSCOPY  06/16/2012   Procedure: COLONOSCOPY;  Surgeon: Lafayette Dragon, MD;  Location: [facility];  Service: Endoscopy;  Laterality: N/A;   DILATION AND CURETTAGE OF UTERUS     KNEE ARTHROSCOPY WITH MENISCAL REPAIR Left    NECK SURGERY     plate with screws to 3 cervical spines   Family History  Problem Relation Age of Onset   Colon polyps Mother    Cancer Mother    Heart disease Mother    Alcoholism Mother    Colon polyps Father    Cancer Father    Alcoholism Father    Bipolar disorder Father    Drug abuse Brother    Bipolar disorder Other    Colon cancer Neg Hx    Stomach cancer Neg Hx    Breast cancer Neg Hx    Esophageal cancer Neg Hx    Rectal cancer Neg Hx    Social History   Socioeconomic History   Marital status: Married    Spouse name: Not on file   Number of children: Not on file   Years of education: Not on file   Highest education level: Not on file  Occupational History   Not on file  Tobacco Use   Smoking status: Never   Smokeless tobacco: Never  Vaping Use   Vaping Use: Never used  Substance and Sexual Activity   Alcohol use: Yes    Comment: occasionaly 1 a month   Drug use: Never   Sexual activity: Not Currently    Birth control/protection: Post-menopausal  Other Topics Concern   Not on file  Social History Narrative   Lives in 2 story home with her husband   Has 2 adult children   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  Strain: Low Risk  (03/06/2020)   Overall Financial Resource Strain (CARDIA)    Difficulty of Paying Living Expenses: Not very hard  Food Insecurity: No Food Insecurity (05/24/2022)   Hunger Vital Sign    Worried About Running Out of Food in the Last Year: Never true    Ran Out of Food in the Last  Year: Never true  Transportation Needs: No Transportation Needs (05/24/2022)   PRAPARE - Hydrologist (Medical): No    Lack of Transportation (Non-Medical): No  Physical Activity: Inactive (05/27/2022)   Exercise Vital Sign    Days of Exercise per Week: 0 days    Minutes of Exercise per Session: 0 min  Stress: No Stress Concern Present (05/27/2022)   [place]    Feeling of Stress : Not at all  Social Connections: Socially Isolated (05/27/2022)   Social Connection and Isolation Panel [NHANES]    Frequency of Communication with Friends and Family: Once a week    Frequency of Social Gatherings with Friends and Family: Once a week    Attends Religious Services: Never    Marine scientist or Organizations: No    Attends Music therapist: Never    Marital Status: Married    Tobacco Counseling Counseling given: Not Answered   Clinical Intake:  Pre-visit preparation completed: Yes  Pain : No/denies pain   How often do you need to have someone help you when you read instructions, pamphlets, or other written materials from your doctor or pharmacy?: 1 - Never  Diabetic? No  Activities of Daily Living    05/27/2022    3:09 PM 08/05/2021    3:42 PM  In your present state of health, do you have any difficulty performing the following activities:  Hearing? 0 0  Vision? 0 0  Difficulty concentrating or making decisions? 1 0  Walking or climbing stairs? 1 0  Dressing or bathing? 0 0  Doing errands, shopping? 1 0  Comment seizure disorder so husband drives her to appointments    Preparing Food and eating ? N   Using the Toilet? N   In the past six months, have you accidently leaked urine? N   Do you have problems with loss of bowel control? N   Managing your Medications? N   Managing your Finances? Y   Comment husband Clinical cytogeneticist or managing your Housekeeping? N     Patient Care Team: Copland, Gay Filler, MD as PCP - General (Family Medicine) Cameron Sprang, MD as Consulting Physician (Neurology)  Indicate any recent Medical Services you may have received from other than [facility] providers in the past year (date may be approximate).     Assessment:   This is a routine wellness examination for Anastacia.  Hearing/Vision screen No results found.  Dietary issues and exercise activities discussed: Current Exercise Habits: The patient does not participate in regular exercise at present, Exercise limited by: psychological condition(s)   Goals Addressed   None    Depression Screen    05/27/2022    3:09 PM 05/17/2022   11:31 AM 02/28/2017    1:38 PM 09/19/2015    9:35 AM 06/05/2015    7:07 PM 03/10/2015    9:35 AM  PHQ 2/9 Scores  PHQ - 2 Score 0 1 0 0 0 0    Fall Risk    05/27/2022    3:09 PM 05/17/2022   11:31 AM 09/29/2021    3:31 PM 09/29/2020    3:27 PM 09/26/2019    4:23 PM  [place] in the past year? '1 1 1 '$ 0 0  Number falls in past yr: '1 1 1 '$ 0 0  Injury with Fall? 0 0 0 0 0  Risk for fall due  to : History of fall(s)      Follow up Falls evaluation completed Education provided       [place]:  Any stairs in or around the home? Yes  If so, are there any without handrails? No  Home free of loose throw rugs in walkways, pet beds, electrical cords, etc? Yes  Adequate lighting in your home to reduce risk of falls? Yes   ASSISTIVE DEVICES UTILIZED TO PREVENT FALLS:  Life alert? No  Use of a cane, walker or w/c? No  Grab bars in the bathroom? No  Shower chair or bench in shower? Yes   Elevated toilet seat or a handicapped toilet? No   TIMED UP AND GO:  Was the test performed? No . Audio visit  Cognitive Function:    02/28/2017    1:38 PM  MMSE - Mini Mental State Exam  Orientation to time 5  Orientation to Place 5  Registration 3  Attention/ Calculation 5  Recall 2  Language- name 2 objects 2  Language- repeat 1  Language- follow 3 step command 3  Language- read & follow direction 1  Write a sentence 1  Copy design 1  Total score 29        05/27/2022    3:13 PM  6CIT Screen  What Year? 0 points  What month? 0 points  What time? 0 points  Count back from 20 0 points  Months in reverse 0 points  Repeat phrase 4 points  Total Score 4 points    Immunizations Immunization History  Administered Date(s) Administered   Fluad Quad(high Dose 65+) 05/14/2021, 05/17/2022   Hepatitis B 05/16/2007, 06/16/2007   Influenza, Seasonal, Injecte, Preservative Fre 08/19/2018   Influenza,inj,Quad PF,6+ Mos 08/19/2018, 04/05/2019, 05/08/2020   Influenza-Unspecified 08/19/2018, 04/09/2019   Moderna SARS-COV2 Booster Vaccination 12/04/2020   Moderna Sars-Covid-2 Vaccination 11/14/2019, 12/12/2019   PNEUMOCOCCAL CONJUGATE-20 05/17/2022   Pfizer Covid-19 Vaccine Bivalent Booster 81yr & up 05/14/2021   Td 08/23/1994, 09/24/1999, 02/28/2017   Tdap 05/16/2007   Zoster Recombinat (Shingrix) 05/08/2020, 07/10/2020    TDAP status: Up to date  Flu Vaccine status: Up to date  Pneumococcal vaccine status: Up to date  Covid-19 vaccine status: Information provided on how to obtain vaccines.   Qualifies for Shingles Vaccine? Yes   Zostavax completed No   Shingrix Completed?: Yes  Screening Tests Health Maintenance  Topic Date Due   COVID-19 Vaccine (4 - Moderna series) 09/13/2021   COLONOSCOPY (Pts [phone] coverage will need to be confirmed)  06/16/2022   MAMMOGRAM  04/24/2023   TETANUS/TDAP  03/01/2027   Pneumonia Vaccine 6579Years old  Completed    INFLUENZA VACCINE  Completed   DEXA SCAN  Completed   Hepatitis C Screening  Completed   Zoster Vaccines- Shingrix  Completed   HPV VACCINES  Aged Out    Health Maintenance  Health Maintenance Due  Topic Date Due   COVID-19 Vaccine (4 - Moderna series) 09/13/2021    Colorectal cancer screening: Type of screening: Colonoscopy. Completed 06/16/12. Repeat every 10 years  Mammogram status: Completed 04/23/21. Repeat every year  Bone Density status: Completed 01/12/21. Results reflect: Bone density results: NORMAL. Repeat every 2 years.  Lung Cancer Screening: (Low Dose CT Chest recommended if Age 66-80ears, 30 pack-year currently smoking OR have quit w/in 15years.) does not qualify.   Lung Cancer Screening Referral: N/a  Additional Screening:  Hepatitis C Screening: does qualify; Completed 04/09/13  Vision  Screening: Recommended annual ophthalmology exams for early detection of glaucoma and other disorders of the eye. Is the patient up to date with their annual eye exam?  Yes  Who is the provider or what is the name of the office in which the patient attends annual eye exams? Dr. Katy Fitch If pt is not established with a provider, would they like to be referred to a provider to establish care? No .   Dental Screening: Recommended annual dental exams for proper oral hygiene  Community Resource Referral / Chronic Care Management: CRR required this visit?  No   CCM required this visit?  No      Plan:     I have personally reviewed and noted the following in the patient's chart:   Medical and social history Use of alcohol, tobacco or illicit drugs  Current medications and supplements including opioid prescriptions. Patient is not currently taking opioid prescriptions. Functional ability and status Nutritional status Physical activity Advanced directives List of other physicians Hospitalizations, surgeries, and ER visits in previous 12 months Vitals Screenings to include  cognitive, depression, and falls Referrals and appointments  In addition, I have reviewed and discussed with patient certain preventive protocols, quality metrics, and best practice recommendations. A written personalized care plan for preventive services as well as general preventive health recommendations were provided to patient.   Due to this being a telephonic visit, the after visit summary with patients personalized plan was offered to patient via mail or my-chart.  Patient would like to access on my-chart.  Beatris Ship, [place]   05/27/2022   Nurse Notes: None

## 2022-06-04 ENCOUNTER — Encounter: Payer: Self-pay | Admitting: Gastroenterology

## 2022-06-07 ENCOUNTER — Ambulatory Visit (INDEPENDENT_AMBULATORY_CARE_PROVIDER_SITE_OTHER): Payer: Medicare Other | Admitting: Psychiatry

## 2022-06-07 DIAGNOSIS — F319 Bipolar disorder, unspecified: Secondary | ICD-10-CM | POA: Diagnosis not present

## 2022-06-07 NOTE — Progress Notes (Signed)
[facility] Counselor/Therapist Progress Note  Patient ID: Theresa [name], MRN: [id number],    Date: 06/07/2022  Time Spent: 50 minutes   Treatment Type: Individual Therapy  Reported Symptoms: "a little bipolar mania" which I tend to get when I have people coming to visit, anxiety especially around people  Mental Status Exam:  Appearance:   Casual     Behavior:  Appropriate, Sharing, and Motivated  Motor:  Normal  Speech/Language:   Clear and Coherent  Affect:  anxious  Mood:  anxious  Thought process:  goal directed  Thought content:    WNL  Sensory/Perceptual disturbances:    WNL  Orientation:  oriented to person, place, time/date, situation, day of week, month of year, year, and stated date of Oct. 16, 2023  Attention:  Good  Concentration:  Good and Fair  Memory:  [place] of knowledge:   Good  Insight:    Good  Judgment:   Good  Impulse Control:  Good and Fair   Risk Assessment: Danger to Self:  No Self-injurious Behavior: No Danger to Others: No Duty to Warn:no Physical Aggression / Violence:No  Access to Firearms a concern: No  Gang Involvement:No   Subjective: Patient today reporting anxiety and my "bipolar mania when I have it." Today states "I'm having anxiety and some of my bipolar which tends to happen anytime I have people coming to visit.  Has a friend who is coming to visit for the rest of the day.  Process her anxiety more in session today, and also needed to work on her" tardive dyskinesia as it is becoming more of an issue at times when it's stronger and  patient works to maintain it as it is causing her to not be able to paint "the realism painting like I used to do a lot and really liked." Worked today on her disappointment in not being able to paint as she used to, due to her tardive dyskinesia. Big disappointment for patient but is determined to paint and showed more confidence in herself being able to do this, towards end of session. Sleep has  been worse until last night but then last night got better sleep last night and hoping that will happen again tonight. Reports being better at setting limits with friend who tended to call her when friend has been drinking. Finds writing things down does help her process them some and plans to keep using the strategy in between sessions.  Reports no further thoughts regarding what she mentioned in session last visit in reference to pictures taken 15 years ago.  (Not all details included in this note due to patient privacy needs).  States that she is really not thought about it much since last session, and feels that might be good and has stated earlier that she feels she has worked through the worst part of her history and previous therapy when she was much younger.  Was noticeably calmer and more focused as session progressed and continued.  Interventions: Cognitive Behavioral Therapy and Ego-Supportive  Treatment goals: Treatment goals remain on treatment plan as patient works with strategies to achieve her goals. Progress is assessed each session and is documented in treatment note. Long term goal: Reduce overall level, frequency, and intensity of the anxiety so that daily functioning is not impaired. Short-term goal: Increase understanding of the beliefs and messages that produce anxiety, depression, and worry. Strategies: Identify , challenge, and replace anxious/negative/depressive self-talk with positive, realistic, and empowering  self talk  Diagnosis:   ICD-10-CM   1. Bipolar affective disorder, remission status unspecified ([place])  F31.9      Plan: Patient today reporting feeling "a little bipolar mania because I have a friend coming to visit later today and this always happens when I have somebody coming to visit".  States "I am also having some anxiety and some down feelings because I cannot paint like I used to or the type of paining that I liked the most".  As noted above patient  processed this in session today and was able to acknowledge and receive support about the loss of being able to do what she refers to as "realism painting" and discussed how this has been her favorite type of paining to do.  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  Seem to really appreciate being able to talk through her ambivalence and feelings of loss over doing her favorite type of painting and she said, one of the things I really do well.  Also showed good interest and a willingness to explore another type of painting that she is looking forward to trying.  Noticeably calmer towards end of session and also some understandable excitement about seeing her friend who is arriving for a visit a little later today.  Good attitude and good work in session. Encouraged patient in practicing more positive behaviors as noted in sessions including: Feeling good about the progress she has made and how she has managed significant challenges, refrain from assuming that things are going to go in a negative direction, follow-up with behaviors that can help her with more regular sleep as discussed in session and with her med provider, recognizing progress she has made over time, believing more in her ability to make significant changes and maintain them, focus more on the things she can change versus cannot, use of positive self talk, staying in the present and trying not to go back to the past or jumping too far into the future, maintaining good boundaries with others, staying in touch with people who are supportive of her, participating in activities that bring her pleasure including with her pets and grandchildren and painting, get outside daily and walk, look for the positives within herself, healthy nutrition and exercise, and  recognize the strength that she shows when working with her goal-directed behaviors to move in a direction that supports her improved emotional health, overall outlook, and wellbeing.  Goal review and progress/challenges noted with patient.  Next appointment within 3 to 4 weeks.  This record has been created using [employer].  Chart creation errors have been sought, but may not always have been located and corrected.  Such creation errors do not reflect on the standard of medical care provided.   Shanon Ace, LCSW

## 2022-06-11 ENCOUNTER — Other Ambulatory Visit: Payer: Self-pay

## 2022-06-11 ENCOUNTER — Telehealth: Payer: Self-pay | Admitting: Psychiatry

## 2022-06-11 DIAGNOSIS — F4310 Post-traumatic stress disorder, unspecified: Secondary | ICD-10-CM

## 2022-06-11 DIAGNOSIS — F5101 Primary insomnia: Secondary | ICD-10-CM

## 2022-06-11 MED ORDER — CLONAZEPAM 1 MG PO TABS: 1.0000 mg | ORAL_TABLET | Freq: Every day | ORAL | 0 refills | Status: DC

## 2022-06-11 NOTE — Telephone Encounter (Signed)
Pended.

## 2022-06-11 NOTE — Telephone Encounter (Signed)
Patient lvm stating that a prescription for Clonazepam was sent to [facility] but she hasn't seen it and unsure of exactly where it is. She is asking for a prescription to be sent [facility] [address]

## 2022-06-13 ENCOUNTER — Telehealth: Payer: Self-pay | Admitting: Internal Medicine

## 2022-06-13 NOTE — Telephone Encounter (Signed)
Vomited all of 1st dose of Suprep  I provided instructions to do MiraLax prep instead

## 2022-06-14 ENCOUNTER — Observation Stay (HOSPITAL_COMMUNITY)
Admission: EM | Admit: 2022-06-14 | Discharge: 2022-06-15 | Disposition: A | Discharge status: Home / Self Care | Payer: Medicare Other | Attending: Student | Admitting: Student

## 2022-06-14 ENCOUNTER — Emergency Department (HOSPITAL_COMMUNITY): Payer: Medicare Other

## 2022-06-14 ENCOUNTER — Ambulatory Visit: Payer: Medicare Other | Admitting: Gastroenterology

## 2022-06-14 ENCOUNTER — Encounter (HOSPITAL_COMMUNITY): Payer: Self-pay | Admitting: Radiology

## 2022-06-14 ENCOUNTER — Other Ambulatory Visit: Payer: Self-pay

## 2022-06-14 DIAGNOSIS — Z743 Need for continuous supervision: Secondary | ICD-10-CM | POA: Diagnosis not present

## 2022-06-14 DIAGNOSIS — F319 Bipolar disorder, unspecified: Secondary | ICD-10-CM | POA: Diagnosis present

## 2022-06-14 DIAGNOSIS — Z1211 Encounter for screening for malignant neoplasm of colon: Secondary | ICD-10-CM

## 2022-06-14 DIAGNOSIS — E871 Hypo-osmolality and hyponatremia: Secondary | ICD-10-CM

## 2022-06-14 DIAGNOSIS — F4310 Post-traumatic stress disorder, unspecified: Secondary | ICD-10-CM | POA: Diagnosis present

## 2022-06-14 DIAGNOSIS — R0602 Shortness of breath: Secondary | ICD-10-CM | POA: Diagnosis not present

## 2022-06-14 DIAGNOSIS — E785 Hyperlipidemia, unspecified: Secondary | ICD-10-CM | POA: Diagnosis present

## 2022-06-14 DIAGNOSIS — G40309 Generalized idiopathic epilepsy and epileptic syndromes, not intractable, without status epilepticus: Principal | ICD-10-CM | POA: Diagnosis present

## 2022-06-14 DIAGNOSIS — R531 Weakness: Secondary | ICD-10-CM | POA: Diagnosis not present

## 2022-06-14 DIAGNOSIS — R569 Unspecified convulsions: Secondary | ICD-10-CM | POA: Diagnosis not present

## 2022-06-14 DIAGNOSIS — R7401 Elevation of levels of liver transaminase levels: Secondary | ICD-10-CM | POA: Diagnosis not present

## 2022-06-14 DIAGNOSIS — R2689 Other abnormalities of gait and mobility: Secondary | ICD-10-CM | POA: Diagnosis not present

## 2022-06-14 DIAGNOSIS — Z7984 Long term (current) use of oral hypoglycemic drugs: Secondary | ICD-10-CM | POA: Insufficient documentation

## 2022-06-14 DIAGNOSIS — Z79899 Other long term (current) drug therapy: Secondary | ICD-10-CM | POA: Diagnosis not present

## 2022-06-14 DIAGNOSIS — E119 Type 2 diabetes mellitus without complications: Secondary | ICD-10-CM | POA: Diagnosis not present

## 2022-06-14 DIAGNOSIS — I499 Cardiac arrhythmia, unspecified: Secondary | ICD-10-CM | POA: Diagnosis not present

## 2022-06-14 DIAGNOSIS — G2401 Drug induced subacute dyskinesia: Secondary | ICD-10-CM | POA: Diagnosis present

## 2022-06-14 DIAGNOSIS — E876 Hypokalemia: Secondary | ICD-10-CM | POA: Diagnosis not present

## 2022-06-14 DIAGNOSIS — G40919 Epilepsy, unspecified, intractable, without status epilepticus: Secondary | ICD-10-CM | POA: Diagnosis present

## 2022-06-14 DIAGNOSIS — R58 Hemorrhage, not elsewhere classified: Secondary | ICD-10-CM | POA: Diagnosis not present

## 2022-06-14 DIAGNOSIS — I10 Essential (primary) hypertension: Secondary | ICD-10-CM | POA: Diagnosis not present

## 2022-06-14 DIAGNOSIS — R6889 Other general symptoms and signs: Secondary | ICD-10-CM | POA: Diagnosis not present

## 2022-06-14 DIAGNOSIS — R4182 Altered mental status, unspecified: Secondary | ICD-10-CM

## 2022-06-14 DIAGNOSIS — S01512A Laceration without foreign body of oral cavity, initial encounter: Secondary | ICD-10-CM

## 2022-06-14 LAB — BASIC METABOLIC PANEL
Anion gap: 13 (ref 5–15)
BUN: 10 mg/dL (ref 8–23)
CO2: 21 mmol/L — ABNORMAL LOW (ref 22–32)
Calcium: 9.2 mg/dL (ref 8.9–10.3)
Chloride: 95 mmol/L — ABNORMAL LOW (ref 98–111)
Creatinine, Ser: 0.83 mg/dL (ref 0.44–1.00)
GFR, Estimated: >60 mL/min (ref >60)
Glucose, Bld: 130 mg/dL — ABNORMAL HIGH (ref 70–99)
Potassium: 3.4 mmol/L — ABNORMAL LOW (ref 3.5–5.1)
Sodium: 129 mmol/L — ABNORMAL LOW (ref 135–145)

## 2022-06-14 LAB — CBC WITH DIFFERENTIAL/PLATELET
Abs Immature Granulocytes: 0.15 10*3/uL — ABNORMAL HIGH (ref 0.00–0.07)
Basophils Absolute: 0 10*3/uL (ref 0.0–0.1)
Basophils Relative: 0 %
Eosinophils Absolute: 0 10*3/uL (ref 0.0–0.5)
Eosinophils Relative: 0 %
HCT: 41.5 % (ref 36.0–46.0)
Hemoglobin: 14.5 g/dL (ref 12.0–15.0)
Immature Granulocytes: 1 %
Lymphocytes Relative: 6 %
Lymphs Abs: 1.1 10*3/uL (ref 0.7–4.0)
MCH: 29 pg (ref 26.0–34.0)
MCHC: 34.9 g/dL (ref 30.0–36.0)
MCV: 83 fL (ref 80.0–100.0)
Monocytes Absolute: 1.2 10*3/uL — ABNORMAL HIGH (ref 0.1–1.0)
Monocytes Relative: 6 %
Neutro Abs: 16.3 10*3/uL — ABNORMAL HIGH (ref 1.7–7.7)
Neutrophils Relative %: 87 %
Platelets: 275 10*3/uL (ref 150–400)
RBC: 5 MIL/uL (ref 3.87–5.11)
RDW: 12.4 % (ref 11.5–15.5)
WBC: 18.7 10*3/uL — ABNORMAL HIGH (ref 4.0–10.5)
nRBC: 0 % (ref 0.0–0.2)

## 2022-06-14 LAB — URINALYSIS, ROUTINE W REFLEX MICROSCOPIC
Bilirubin Urine: NEGATIVE
Glucose, UA: NEGATIVE mg/dL
Ketones, ur: 5 mg/dL — AB
Leukocytes,Ua: NEGATIVE
Nitrite: NEGATIVE
Protein, ur: 100 mg/dL — AB
Specific Gravity, Urine: 1.008 (ref 1.005–1.030)
pH: 6 (ref 5.0–8.0)

## 2022-06-14 LAB — CBG MONITORING, ED: Glucose-Capillary: 134 mg/dL — ABNORMAL HIGH (ref 70–99)

## 2022-06-14 LAB — LIPASE, BLOOD: Lipase: 30 U/L (ref 11–51)

## 2022-06-14 MED ORDER — CARBAMAZEPINE 200 MG PO TABS
400.0000 mg | ORAL_TABLET | Freq: Once | ORAL | Status: AC
  Administered 2022-06-14: 400 mg via ORAL
  Filled 2022-06-14: qty 2

## 2022-06-14 MED ORDER — LEVETIRACETAM 500 MG PO TABS
500.0000 mg | ORAL_TABLET | Freq: Two times a day (BID) | ORAL | Status: DC
  Administered 2022-06-14 – 2022-06-15 (×2): 500 mg via ORAL
  Filled 2022-06-14 (×2): qty 1

## 2022-06-14 MED ORDER — CARBAMAZEPINE ER 100 MG PO TB12
300.0000 mg | ORAL_TABLET | Freq: Two times a day (BID) | ORAL | Status: DC
  Administered 2022-06-15: 300 mg via ORAL
  Filled 2022-06-14: qty 3

## 2022-06-14 MED ORDER — LORAZEPAM 2 MG/ML IJ SOLN: 2.0000 mg | Freq: Once | INTRAMUSCULAR | Status: AC

## 2022-06-14 MED ORDER — CHLORHEXIDINE GLUCONATE 0.12% ORAL RINSE (MEDLINE KIT)
15.0000 mL | Freq: Two times a day (BID) | OROMUCOSAL | Status: DC
  Administered 2022-06-15: 15 mL via OROMUCOSAL

## 2022-06-14 MED ORDER — LORAZEPAM 2 MG/ML IJ SOLN
INTRAMUSCULAR | Status: AC
  Administered 2022-06-14: 2 mg via INTRAVENOUS
  Filled 2022-06-14: qty 1

## 2022-06-14 MED ORDER — PRAVASTATIN SODIUM 20 MG PO TABS: 20.0000 mg | ORAL_TABLET | Freq: Every day | ORAL | Status: DC

## 2022-06-14 MED ORDER — KETOROLAC TROMETHAMINE 30 MG/ML IJ SOLN
30.0000 mg | Freq: Once | INTRAMUSCULAR | Status: AC
  Administered 2022-06-14: 30 mg via INTRAVENOUS
  Filled 2022-06-14: qty 1

## 2022-06-14 MED ORDER — TRAZODONE HCL 100 MG PO TABS: 100.0000 mg | ORAL_TABLET | Freq: Every evening | ORAL | Status: DC | PRN

## 2022-06-14 MED ORDER — DEUTETRABENAZINE 12 MG PO TABS: ORAL_TABLET | Freq: Two times a day (BID) | ORAL | Status: DC

## 2022-06-14 MED ORDER — POLYETHYLENE GLYCOL 3350 17 G PO PACK: 17.0000 g | PACK | Freq: Every day | ORAL | Status: DC | PRN

## 2022-06-14 MED ORDER — OLANZAPINE 5 MG PO TABS
7.5000 mg | ORAL_TABLET | Freq: Every day | ORAL | Status: DC
  Administered 2022-06-14: 7.5 mg via ORAL
  Filled 2022-06-14: qty 2

## 2022-06-14 MED ORDER — LORAZEPAM 2 MG/ML IJ SOLN: 4.0000 mg | INTRAMUSCULAR | Status: DC | PRN

## 2022-06-14 MED ORDER — LACTATED RINGERS IV BOLUS
1000.0000 mL | Freq: Once | INTRAVENOUS | Status: AC
  Administered 2022-06-14: 1000 mL via INTRAVENOUS

## 2022-06-14 MED ORDER — ALPRAZOLAM 0.5 MG PO TABS
1.0000 mg | ORAL_TABLET | Freq: Every day | ORAL | Status: DC | PRN
  Administered 2022-06-14: 1 mg via ORAL
  Filled 2022-06-14: qty 2

## 2022-06-14 MED ORDER — ALPRAZOLAM ER 1 MG PO TB24: 2.0000 mg | ORAL_TABLET | Freq: Two times a day (BID) | ORAL | Status: DC

## 2022-06-14 MED ORDER — ORAL CARE MOUTH RINSE
15.0000 mL | OROMUCOSAL | Status: DC
  Administered 2022-06-14 – 2022-06-15 (×6): 15 mL via OROMUCOSAL

## 2022-06-14 MED ORDER — ENOXAPARIN SODIUM 40 MG/0.4ML IJ SOSY
40.0000 mg | PREFILLED_SYRINGE | INTRAMUSCULAR | Status: DC
  Administered 2022-06-14: 40 mg via SUBCUTANEOUS
  Filled 2022-06-14: qty 0.4

## 2022-06-14 MED ORDER — SODIUM CHLORIDE 0.9% FLUSH
3.0000 mL | Freq: Two times a day (BID) | INTRAVENOUS | Status: DC
  Administered 2022-06-14 – 2022-06-15 (×2): 3 mL via INTRAVENOUS

## 2022-06-14 MED ORDER — IBUPROFEN 200 MG PO TABS: 400.0000 mg | ORAL_TABLET | Freq: Four times a day (QID) | ORAL | Status: DC | PRN

## 2022-06-14 MED ORDER — INSULIN ASPART 100 UNIT/ML IJ SOLN
0.0000 [IU] | Freq: Three times a day (TID) | INTRAMUSCULAR | Status: DC
  Administered 2022-06-15: 1 [IU] via SUBCUTANEOUS
  Filled 2022-06-14: qty 0.09

## 2022-06-14 MED ORDER — LORAZEPAM 2 MG/ML IJ SOLN: 1.0000 mg | Freq: Once | INTRAMUSCULAR | Status: DC

## 2022-06-14 MED ORDER — LEVETIRACETAM IN NACL 1000 MG/100ML IV SOLN
1000.0000 mg | Freq: Once | INTRAVENOUS | Status: AC
  Administered 2022-06-14: 1000 mg via INTRAVENOUS
  Filled 2022-06-14: qty 100

## 2022-06-14 MED ORDER — DEUTETRABENAZINE 9 MG PO TABS: ORAL_TABLET | Freq: Two times a day (BID) | ORAL | Status: DC

## 2022-06-14 NOTE — H&P (Signed)
History and Physical   DIMPLES PROBUS ASN:[id number] DOB: 02/07/1956 DOA: 06/14/2022  PCP: Darreld Mclean, MD   Patient coming from: Endoscopy suite  Chief Complaint: Seizure  HPI: Theresa [name] is a 66 y.o. female with medical history significant of epilepsy, bipolar, PTSD, tardive dyskinesia, anxiety, depression, hyperlipidemia, GERD, diabetes presenting after seizure.  Patient had a witnessed seizure in the endoscopy suite.  Staff reports gram all type seizure.  She was due to have EGD today.  She was n.p.o. overnight except for her meds however she did not take her medications including her seizure medications nor her Xanax which she takes as much of 7 mg a day.  She has remained postictal in the ED.  She has had some improvement in her mentation and is now able to answer simple questions but is not back at baseline.  Denies fevers, chills, chest pain, shortness breath, dumping, constipation, diarrhea, nausea, vomiting.  ED Course: Vital signs in ED significant for heart rate in the 100s, blood pressure in the 193X to 902I systolic, respiratory rate in the teens to 20s.  Lab work-up included BMP with sodium 129, potassium 3.4, chloride 95, bicarb 21, glucose 130.  CBC with leukocytosis to 18.7.  Lipase normal.  Urinalysis with hemoglobin, ketones, protein, rare bacteria only.  CT head with no acute abnormality.  Chest x-ray showed no acute normality.  Patient received Toradol, Ativan, carbamazepine, Keppra, liter fluids in the ED.  Neurology consulted and will see the patient in consult in the morning, recommend admission to [facility].  Review of Systems: As per HPI otherwise all other systems reviewed and are negative.  Past Medical History:  Diagnosis Date   Adenomatous colon polyp    Allergy    Anxiety    Bipolar 1 disorder ([place])    Blood transfusion without reported diagnosis    Depression    Elevated LFTs    GERD (gastroesophageal reflux disease)    Hepatic steatosis  05/07/2013   Hyperlipidemia    Seizures ([place]) 1977   Last seizure 01/2020   Tardive dyskinesia     Past Surgical History:  Procedure Laterality Date   APPENDECTOMY     COLONOSCOPY  06/16/2012   Procedure: COLONOSCOPY;  Surgeon: Lafayette Dragon, MD;  Location: [facility];  Service: Endoscopy;  Laterality: N/A;   DILATION AND CURETTAGE OF UTERUS     KNEE ARTHROSCOPY WITH MENISCAL REPAIR Left    NECK SURGERY     plate with screws to 3 cervical spines    Social History  reports that she has never smoked. She has never used smokeless tobacco. She reports current alcohol use. She reports that she does not use drugs.  Allergies  Allergen Reactions   Cyclobenzaprine     Blisters in mouth   Lamictal [Lamotrigine]     Acute renal failure   Ropinirole Nausea And Vomiting   Tramadol    Acetaminophen Itching, Other (See Comments), Rash and Swelling   Codeine Swelling and Rash    Can take hydrocodone   Penicillins     Abdominal pain    Family History  Problem Relation Age of Onset   Colon polyps Mother    Cancer Mother    Heart disease Mother    Alcoholism Mother    Colon polyps Father    Cancer Father    Alcoholism Father    Bipolar disorder Father    Drug abuse Brother    Bipolar disorder Other    Colon  cancer Neg Hx    Stomach cancer Neg Hx    Breast cancer Neg Hx    Esophageal cancer Neg Hx    Rectal cancer Neg Hx   Reviewed on admission  Prior to Admission medications   Medication Sig Start Date End Date Taking? Authorizing Provider  ALPRAZolam (XANAX XR) 2 MG 24 hr tablet TAKE 1 TABLET BY MOUTH IN THE  MORNING AND AT BEDTIME 07/14/22   [name], PMHNP  ALPRAZolam Duanne Moron) 1 MG tablet TAKE 1 TABLET BY MOUTH DAILY AS  NEEDED FOR SEVERE ANXIETY 06/24/22   [name], PMHNP  carbamazepine (CARBATROL) 300 MG 12 hr capsule TAKE 1 CAPSULE BY MOUTH IN  THE MORNING AND 2 CAPSULES  BY MOUTH AT BEDTIME 02/26/22   Mozingo, Berdie Ogren, NP  cetirizine (ZYRTEC) 10 MG  tablet Take 10 mg by mouth daily as needed.     [provider]  clonazePAM (KLONOPIN) 1 MG tablet Take 1 tablet (1 mg total) by mouth at bedtime. 06/17/22   [name], PMHNP  Deutetrabenazine (AUSTEDO) 12 MG TABS Take 1 tablet po BID with a 9 mg tablet to equal 21 mg po BID 05/20/22   [name], PMHNP  Deutetrabenazine (AUSTEDO) 9 MG TABS Take 1 tab po BID with a 12 mg tab to equal 21 mg BID 05/20/22   [name], PMHNP  docusate sodium (COLACE) 50 MG capsule Take 50 mg by mouth daily as needed for mild constipation.    [provider]  fluticasone (FLONASE) 50 MCG/ACT nasal spray Place into both nostrils daily as needed.     [provider]  levETIRAcetam (KEPPRA) 500 MG tablet Take 1 tablet (500 mg total) by mouth 2 (two) times daily. 09/29/21   Cameron Sprang, MD  lovastatin (MEVACOR) 20 MG tablet TAKE 1 TABLET BY MOUTH DAILY 02/24/22   Copland, Gay Filler, MD  Melatonin 10 MG TABS Take 1 tablet by mouth at bedtime. Reported on 01/30/2016    [provider]  metFORMIN (GLUCOPHAGE) 500 MG tablet TAKE 1 TABLET BY MOUTH TWICE  DAILY WITH A MEAL 10/12/21   [name], PMHNP  Multiple Vitamins-Minerals (CENTRUM SILVER PO) Take by mouth.    [provider]  OLANZapine (ZYPREXA) 7.5 MG tablet Take 1 tablet (7.5 mg total) by mouth at bedtime. 05/20/22   [name], PMHNP  pantoprazole (PROTONIX) 40 MG tablet TAKE 1 TABLET BY MOUTH DAILY AS  NEEDED 02/15/22   Copland, Gay Filler, MD  polyethylene glycol (MIRALAX / GLYCOLAX) 17 g packet Take 17 g by mouth daily as needed.    [provider]  tiZANidine (ZANAFLEX) 2 MG tablet Take 1 tablet (2 mg total) by mouth every 6 (six) hours as needed for muscle spasms. 05/17/22   Copland, Gay Filler, MD  traZODone (DESYREL) 100 MG tablet Take 1-2 tablets (100-200 mg total) by mouth at bedtime as needed. for sleep 02/11/22   [name], PMHNP  zolpidem (AMBIEN) 10 MG tablet Take 1 tablet (10 mg  total) by mouth at bedtime as needed. 05/24/22   [name], PMHNP    Physical Exam: Vitals:   06/14/22 1630 06/14/22 1730 06/14/22 1830 06/14/22 1848  BP: (!) 153/92 (!) 141/91 (!) 157/90   Pulse: (!) 101 100 (!) 115   Resp: '18 18 18   '$ Temp:    98.6 F (37 C)  TempSrc:    Oral  SpO2: 98% 92% 97%   Weight:      Height:  Physical Exam Constitutional:      General: She is not in acute distress.    Appearance: Normal appearance.  HENT:     Head: Normocephalic and atraumatic.     Mouth/Throat:     Mouth: Mucous membranes are moist.     Pharynx: Oropharynx is clear.  Eyes:     Extraocular Movements: Extraocular movements intact.     Pupils: Pupils are equal, round, and reactive to light.  Cardiovascular:     Rate and Rhythm: Normal rate and regular rhythm.     Pulses: Normal pulses.     Heart sounds: Normal heart sounds.  Pulmonary:     Effort: Pulmonary effort is normal. No respiratory distress.     Breath sounds: Normal breath sounds.  Abdominal:     General: Bowel sounds are normal. There is no distension.     Palpations: Abdomen is soft.     Tenderness: There is no abdominal tenderness.  Musculoskeletal:        General: No swelling or deformity.  Skin:    General: Skin is warm and dry.  Neurological:     General: No focal deficit present.     Comments: Alert.  Answers some questions.  But does not answer everything appropriately.  Still confused.    Labs on Admission: I have personally reviewed following labs and imaging studies  CBC: Recent Labs  Lab 06/14/22 1229  WBC 18.7*  NEUTROABS 16.3*  HGB 14.5  HCT 41.5  MCV 83.0  PLT 485    Basic Metabolic Panel: Recent Labs  Lab 06/14/22 1229  NA 129*  K 3.4*  CL 95*  CO2 21*  GLUCOSE 130*  BUN 10  CREATININE 0.83  CALCIUM 9.2    GFR: Estimated Creatinine Clearance: 72.7 mL/min (by C-G formula based on SCr of 0.83 mg/dL).  Liver Function Tests: No results for input(s): "AST", "ALT",  "ALKPHOS", "BILITOT", "PROT", "ALBUMIN" in the last 168 hours.  Urine analysis:    Component Value Date/Time   COLORURINE STRAW (A) 06/14/2022 1409   APPEARANCEUR CLEAR 06/14/2022 1409   LABSPEC 1.008 06/14/2022 1409   PHURINE 6.0 06/14/2022 1409   GLUCOSEU NEGATIVE 06/14/2022 1409   HGBUR LARGE (A) 06/14/2022 1409   BILIRUBINUR NEGATIVE 06/14/2022 1409   BILIRUBINUR negative 08/05/2021 1550   KETONESUR 5 (A) 06/14/2022 1409   PROTEINUR 100 (A) 06/14/2022 1409   UROBILINOGEN 0.2 08/05/2021 1550   NITRITE NEGATIVE 06/14/2022 1409   LEUKOCYTESUR NEGATIVE 06/14/2022 1409    Radiological Exams on Admission: DG Chest Portable 1 View  Result Date: 06/14/2022 CLINICAL DATA:  Shortness of breath EXAM: PORTABLE CHEST 1 VIEW COMPARISON:  05/18/2022 FINDINGS: The heart size and mediastinal contours are within normal limits. Both lungs are clear. The visualized skeletal structures are unremarkable. IMPRESSION: No acute abnormality of the lungs in AP portable projection. Electronically Signed   By: Delanna Ahmadi M.D.   On: 06/14/2022 14:00   CT HEAD WO CONTRAST (5MM)  Result Date: 06/14/2022 CLINICAL DATA:  Seizure.  Head trauma, minor. EXAM: CT HEAD WITHOUT CONTRAST TECHNIQUE: Contiguous axial images were obtained from the base of the skull through the vertex without intravenous contrast. RADIATION DOSE REDUCTION: This exam was performed according to the departmental dose-optimization program which includes automated exposure control, adjustment of the mA and/or kV according to patient size and/or use of iterative reconstruction technique. COMPARISON:  None Available. FINDINGS: Brain: The brain shows a normal appearance without evidence of malformation, atrophy, old or acute small or large  vessel infarction, mass lesion, hemorrhage, hydrocephalus or extra-axial collection. Vascular: No hyperdense vessel. No evidence of atherosclerotic calcification. Skull: Normal.  No traumatic finding.  No focal  bone lesion. Sinuses/Orbits: Sinuses are clear. Orbits appear normal. Mastoids are clear. Other: None significant IMPRESSION: Normal head CT. Electronically Signed   By: Nelson Chimes M.D.   On: 06/14/2022 12:54    EKG: Not performed in the emergency department  Assessment/Plan Principal Problem:   Seizure [facility]) Active Problems:   Generalized idiopathic epilepsy and epileptic syndromes, not intractable, without status epilepticus ([place])   Posttraumatic stress disorder   Tardive dyskinesia   Dyslipidemia   Seizure Epilepsy > Patient with known history of epilepsy held home antiepileptics and home Xanax (which she takes up to 7 mg a day)  today in preparation for EGD. > Had seizure in the endoscopy suite which was witnessed by staff. > Has improved in mentation somewhat in the ED but remains confused/not back at baseline. > Neurology consulted and recommending admission to [facility].  Could be commendation of missed antiepileptics and early benzo withdrawal considering the medics that have been held. > Patient received Ativan, carbamazepine, Keppra in the ED. - Monitor on telemetry at [facility] - Neurology requesting reconsult in the morning to see the patient - Continue with Keppra and carbamazepine p.o. as patient is tolerating this at this time - Resume home Xanax - Seizure precautions - Supportive care  Bipolar disorder PTSD Anxiety Depression Tardive dyskinesia - Continue home [place], Zyprexa, trazodone - Continue home Xanax - Holding home Ambien for now  Hyperlipidemia - Could replace home lovastatin with formulary pravastatin  Diabetes - SSI  DVT prophylaxis: Lovenox Code Status:   Full Family Communication:  Updated at bedside Disposition Plan:   Patient is from:  Home  Anticipated DC to:  Home  Anticipated DC date:  1 to 2 days  Anticipated DC barriers: None  Consults called:  Neurology, will see the patient in the morning at [facility]. Admission status:   Observation, telemetry  Severity of Illness: The appropriate patient status for this patient is OBSERVATION. Observation status is judged to be reasonable and necessary in order to provide the required intensity of service to ensure the patient's safety. The patient's presenting symptoms, physical exam findings, and initial radiographic and laboratory data in the context of their medical condition is felt to place them at decreased risk for further clinical deterioration. Furthermore, it is anticipated that the patient will be medically stable for discharge from the hospital within 2 midnights of admission.    Marcelyn Bruins MD [facility]  How to contact the [facility] Attending or Consulting provider [place] or covering provider during after hours [place], for this patient?   Check the care team in [facility] and look for a) attending/consulting [facility] provider listed and b) the [facility] team listed Log into [email or web address] and use 's universal password to access. If you do not have the password, please contact the hospital operator. Locate the [facility] provider you are looking for under [facility] and page to a number that you can be directly reached. If you still have difficulty reaching the provider, please page the [facility] (Director on Call) for the Hospitalists listed on amion for assistance.  06/14/2022, 7:04 PM

## 2022-06-14 NOTE — Progress Notes (Signed)
Patient had an episode of generalized seizure with loss of consciousness in the admitting pre procedure area as soon as she walked into the facility.  HR 120's, O2 sat in 80's needed supplemental O2 and bag EMS was called and patient transferred to [facility] ER   She has h/o seizure disorder, last episode was >1 year ago per her husband  Will consider Cologaurd for colorectal cancer screening and defer colonoscopy or invasive procedures for now

## 2022-06-14 NOTE — ED Provider Notes (Signed)
[facility] DEPT Provider Note   CSN: [id number] Arrival date & time: 06/14/22  1042     History Chief Complaint  Patient presents with  . Seizures    HPI Theresa [name] is a 66 y.o. female presenting for witnessed seizure.  She is a 66 year old female who was supposed to be getting an endoscopy today.  She she was supposed to be n.p.o. for meds last night and this morning.  She has a history of epilepsy and is on Keppra.  Per staff, it was witnessed grand mal No medications with EMS, blood sugar 164, patient with slow return to baseline Also had a prolonged conversation with patient's husband.  He endorses a history of similar.  She has been on Keppra and has not had a seizure in approximately 1 year. Patient's recorded medical, surgical, social, medication list and allergies were reviewed in the Snapshot window as part of the initial history.   Review of Systems   Review of Systems  Constitutional:  Negative for chills and fever.  HENT:  Negative for ear pain and sore throat.   Eyes:  Negative for pain and visual disturbance.  Respiratory:  Negative for cough and shortness of breath.   Cardiovascular:  Negative for chest pain and palpitations.  Gastrointestinal:  Negative for abdominal pain and vomiting.  Genitourinary:  Negative for dysuria and hematuria.  Musculoskeletal:  Negative for arthralgias and back pain.  Skin:  Negative for color change and rash.  Neurological:  Positive for seizures. Negative for syncope.  All other systems reviewed and are negative.   Physical Exam Updated Vital Signs BP (!) 153/113   Pulse (!) 109   Temp 98 F (36.7 C) (Oral)   Resp (!) 24   Ht '5\' 6"'$  (1.676 m)   Wt 83.9 kg   SpO2 100%   BMI 29.86 kg/m  Physical Exam Vitals and nursing note reviewed.  Constitutional:      General: She is not in acute distress.    Appearance: She is well-developed.  HENT:     Head: Normocephalic and atraumatic.  Eyes:      Conjunctiva/sclera: Conjunctivae normal.  Cardiovascular:     Rate and Rhythm: Normal rate and regular rhythm.     Heart sounds: No murmur heard. Pulmonary:     Effort: Pulmonary effort is normal. No respiratory distress.     Breath sounds: Normal breath sounds.  Abdominal:     Palpations: Abdomen is soft.     Tenderness: There is no abdominal tenderness.  Musculoskeletal:        General: Signs of injury (Toungue laceration.) present. No swelling.     Cervical back: Neck supple.  Skin:    General: Skin is warm and dry.     Capillary Refill: Capillary refill takes less than 2 seconds.  Neurological:     Mental Status: She is alert.  Psychiatric:        Mood and Affect: Mood normal.     ED Course/ Medical Decision Making/ A&P    Procedures Procedures   Medications Ordered in ED Medications  LORazepam (ATIVAN) injection 1 mg (has no administration in time range)  levETIRAcetam (KEPPRA) IVPB 1000 mg/100 mL premix (1,000 mg Intravenous New Bag/Given 06/14/22 1227)  lactated ringers bolus 1,000 mL (1,000 mLs Intravenous New Bag/Given 06/14/22 1348)    Medical Decision Making:    Theresa [name] is a 66 y.o. female who presented to the ED today with seizure episode detailed above.  Patient's presentation is complicated by their history of psychiatric disease, underlying epilepsy.  Patient placed on continuous vitals and telemetry monitoring while in ED which was reviewed periodically.   Complete initial physical exam performed, notably the patient  was hemodynamically stable in no acute distress.  She does remain partially confused, she requires repeat prompting on questions and gets the date wrong even when reminded.      Reviewed and confirmed nursing documentation for past medical history, family history, social history.    Initial Assessment:   This is most consistent with an acute life/limb threatening illness complicated by underlying chronic  conditions. Patient history of present illness and physical exam findings are most likely consistent with epilepsy secondary to missed occasion doses in preparation for colonoscopy.  She is going to be loaded on IV Keppra in the emergency department and evaluated for alternative causes of her breakthrough seizures.  Alternative causes including intracranial mass, intracranial hemorrhage, infection, metabolic disturbances all considered on the differential.  initial Plan:  CT head to evaluate for structural etiology Screening labs including CBC and Metabolic panel to evaluate for infectious or metabolic etiology of disease.  Objective evaluation as below reviewed with plan for close reassessment  Initial Study Results:   Laboratory  All laboratory results reviewed without evidence of clinically relevant pathology.    EKG EKG was reviewed independently. Rate, rhythm, axis, intervals all examined and without medically relevant abnormality. ST segments without concerns for elevations.    Radiology  All images reviewed independently. Agree with radiology report at this time.   DG Chest Portable 1 View  Result Date: 06/14/2022 CLINICAL DATA:  Shortness of breath EXAM: PORTABLE CHEST 1 VIEW COMPARISON:  05/18/2022 FINDINGS: The heart size and mediastinal contours are within normal limits. Both lungs are clear. The visualized skeletal structures are unremarkable. IMPRESSION: No acute abnormality of the lungs in AP portable projection. Electronically Signed   By: Delanna Ahmadi M.D.   On: 06/14/2022 14:00   CT HEAD WO CONTRAST (5MM)  Result Date: 06/14/2022 CLINICAL DATA:  Seizure.  Head trauma, minor. EXAM: CT HEAD WITHOUT CONTRAST TECHNIQUE: Contiguous axial images were obtained from the base of the skull through the vertex without intravenous contrast. RADIATION DOSE REDUCTION: This exam was performed according to the departmental dose-optimization program which includes automated exposure control,  adjustment of the mA and/or kV according to patient size and/or use of iterative reconstruction technique. COMPARISON:  None Available. FINDINGS: Brain: The brain shows a normal appearance without evidence of malformation, atrophy, old or acute small or large vessel infarction, mass lesion, hemorrhage, hydrocephalus or extra-axial collection. Vascular: No hyperdense vessel. No evidence of atherosclerotic calcification. Skull: Normal.  No traumatic finding.  No focal bone lesion. Sinuses/Orbits: Sinuses are clear. Orbits appear normal. Mastoids are clear. Other: None significant IMPRESSION: Normal head CT. Electronically Signed   By: Nelson Chimes M.D.   On: 06/14/2022 12:54     Consults:  Case discussed with neurology.   Final Assessment and Plan:   On repeat evaluation, patient has remained intermittently confused.  Per her husband, her baseline is able to complete all of her own ADLs and since she has been here she has been urinating and defecating on herself.  She does have comorbid psychiatric disease which the husband thinks may be related but he is uncertain.  He denies any symptoms yesterday and patient was otherwise normal last night.  He does note that this morning she seemed vaguely confused had no other complaints. She  did skip all of her medications including her home Xanax which she uses 1 mg 3 times daily and 2 mg slow release dose.  She also skipped her Klonopin, Keppra, carbamazepine.  Clinical Impression: No diagnosis found.   Data Unavailable   Final Clinical Impression(s) / ED Diagnoses Final diagnoses:  None    Rx / DC Orders ED Discharge Orders     None         Tretha Sciara, MD 06/15/22 [phone]

## 2022-06-14 NOTE — ED Triage Notes (Signed)
Pt BIB EMS, Was at [facility] for pre admit Endoscopy. Witnessed grandmal seizure 8-10 mins, pt bit her tongue. AO x 1 to self only. Per EMS, pt did not take her seizure meds this morning and last night including psych meds.   150/90 HR 122 SPO2 96% CBG 164 Rt hand 20g

## 2022-06-14 NOTE — Progress Notes (Signed)
PT arrived to the [facility] and was called back to Pre op. Prior to sitting down for vital signs and check in she had an episode/Grand Mal seizure that lasted approximately 5 min. 911 was called. The patient was assisted to the floor and turned on her right side. Head was protected with a pillow. Pt bit her tongue during the episode. VSS, BP 114/76 HR was 122 and regular, O2 sat 81. PT was placed on O2 mask. Pt became responsive and assisted to a stretcher.  20 g IV placed in the Right hand and IV fluids with Normal Saline started.  Pt as alert but not oriented. Dr. Silverio Decamp was at the bedside with the spouse.  The Paramedics arrived, report given and patient transported to [facility] ED.

## 2022-06-14 NOTE — ED Provider Notes (Signed)
66 yo female presenting with seizure from endoscopy suite Holding meds 24 hours for her procedure Takes 7 mg xanax daily, also keppra and carbamazepine  Now several hours after seizure, patient appears confused, not fully oriented, no ongoing seizure  Pending neurology consult    Physical Exam  BP (!) 153/113   Pulse (!) 109   Temp 98 F (36.7 C) (Oral)   Resp (!) 24   Ht '5\' 6"'$  (1.676 m)   Wt 83.9 kg   SpO2 100%   BMI 29.86 kg/m   Physical Exam  Procedures  Procedures  ED Course / MDM    Medical Decision Making Amount and/or Complexity of Data Reviewed Labs: ordered. Radiology: ordered.  Risk Prescription drug management. Decision regarding hospitalization.   Patient assessed, I do not see any ongoing clonic activity, but she does remain confused according to her husband.  She can answer simple questions.  She reports she has a headache and she feels unwell.  She was ordered 1 mg of Ativan at the time of signout by Dr. Oswald Hillock; I have ordered an additional 1 mg of Ativan and advised nursing to give this as well.  This is based on her very high daily dosage of benzodiazepines.  She does have some tachycardia and hypertension which could be consistent with benzo withdrawal.  I am awaiting consultation callback from neurology  *  Update 6 pm - I spoke to Dr Quinn Axe, from neurology, regarding the patient's continued confusion.  She appears to be forgetting things according to her family and has very limited short-term memory.  The neurologist agrees that this could be related to medication side effect including benzo withdrawals, or other new may be an alternative cause.  She recommends that if the patient is not back to baseline state, we admit her for observation and transfer her to [facility].  We can continue dosing IV benzos overnight, to match the patient's preceding daily requirements.  We should also replenish her carbamazepine dose, which she has missed, and  now has been ordered.  I updated the patient's family which is her daughter at the bedside, and they are in agreement with staying in the hospital.  I will paged hospitalist for admission       Wyvonnia Dusky, MD 06/14/22 [phone]

## 2022-06-15 DIAGNOSIS — G40919 Epilepsy, unspecified, intractable, without status epilepticus: Secondary | ICD-10-CM | POA: Diagnosis not present

## 2022-06-15 DIAGNOSIS — E871 Hypo-osmolality and hyponatremia: Secondary | ICD-10-CM

## 2022-06-15 DIAGNOSIS — F319 Bipolar disorder, unspecified: Secondary | ICD-10-CM

## 2022-06-15 DIAGNOSIS — G2401 Drug induced subacute dyskinesia: Secondary | ICD-10-CM | POA: Diagnosis not present

## 2022-06-15 DIAGNOSIS — E876 Hypokalemia: Secondary | ICD-10-CM | POA: Diagnosis not present

## 2022-06-15 DIAGNOSIS — S01512A Laceration without foreign body of oral cavity, initial encounter: Secondary | ICD-10-CM | POA: Diagnosis not present

## 2022-06-15 DIAGNOSIS — R7401 Elevation of levels of liver transaminase levels: Secondary | ICD-10-CM

## 2022-06-15 LAB — COMPREHENSIVE METABOLIC PANEL
ALT: 38 U/L (ref 0–44)
AST: 113 U/L — ABNORMAL HIGH (ref 15–41)
Albumin: 4.4 g/dL (ref 3.5–5.0)
Alkaline Phosphatase: 81 U/L (ref 38–126)
Anion gap: 11 (ref 5–15)
BUN: 10 mg/dL (ref 8–23)
CO2: 21 mmol/L — ABNORMAL LOW (ref 22–32)
Calcium: 8.9 mg/dL (ref 8.9–10.3)
Chloride: 102 mmol/L (ref 98–111)
Creatinine, Ser: 0.77 mg/dL (ref 0.44–1.00)
GFR, Estimated: >60 mL/min (ref >60)
Glucose, Bld: 118 mg/dL — ABNORMAL HIGH (ref 70–99)
Potassium: 3.4 mmol/L — ABNORMAL LOW (ref 3.5–5.1)
Sodium: 134 mmol/L — ABNORMAL LOW (ref 135–145)
Total Bilirubin: 0.9 mg/dL (ref 0.3–1.2)
Total Protein: 7.3 g/dL (ref 6.5–8.1)

## 2022-06-15 LAB — CBG MONITORING, ED
Glucose-Capillary: 112 mg/dL — ABNORMAL HIGH (ref 70–99)
Glucose-Capillary: 129 mg/dL — ABNORMAL HIGH (ref 70–99)

## 2022-06-15 LAB — CBC
HCT: 41.4 % (ref 36.0–46.0)
Hemoglobin: 14.3 g/dL (ref 12.0–15.0)
MCH: 29.1 pg (ref 26.0–34.0)
MCHC: 34.5 g/dL (ref 30.0–36.0)
MCV: 84.3 fL (ref 80.0–100.0)
Platelets: 247 10*3/uL (ref 150–400)
RBC: 4.91 MIL/uL (ref 3.87–5.11)
RDW: 12.9 % (ref 11.5–15.5)
WBC: 14.7 10*3/uL — ABNORMAL HIGH (ref 4.0–10.5)
nRBC: 0 % (ref 0.0–0.2)

## 2022-06-15 MED ORDER — DEUTETRABENAZINE 12 MG PO TABS: 12.0000 mg | ORAL_TABLET | Freq: Two times a day (BID) | ORAL | Status: DC

## 2022-06-15 MED ORDER — POTASSIUM CHLORIDE CRYS ER 20 MEQ PO TBCR
40.0000 meq | EXTENDED_RELEASE_TABLET | Freq: Once | ORAL | Status: AC
  Administered 2022-06-15: 40 meq via ORAL
  Filled 2022-06-15: qty 2

## 2022-06-15 MED ORDER — CHLORHEXIDINE GLUCONATE 0.12 % MT SOLN: 15.0000 mL | Freq: Two times a day (BID) | OROMUCOSAL | 0 refills | Status: DC

## 2022-06-15 MED ORDER — LIP MEDEX EX OINT
TOPICAL_OINTMENT | Freq: Once | CUTANEOUS | Status: AC
  Filled 2022-06-15: qty 7

## 2022-06-15 MED ORDER — DEUTETRABENAZINE 9 MG PO TABS: 9.0000 mg | ORAL_TABLET | Freq: Two times a day (BID) | ORAL | Status: DC

## 2022-06-15 NOTE — ED Notes (Signed)
Pt keeps taking off monitoring equipment.

## 2022-06-15 NOTE — Discharge Summary (Signed)
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 for endoscopy.  She was in postictal state on admission.  She had significant right-sided tongue laceration.  She was given Ativan, Tegretol, Keppra and IV fluid bolus.  Neurology consulted and she was admitted for overnight observation.  Patient has not had further seizure.  Mental status back to baseline.  Discussed with on-call neurology who recommended discharge on home AED and seizure precaution including not driving for 6 months until cleared by her neurologist.  Patient to follow-up with PCP and her neurologist in 1 to 2 weeks or sooner if needed.  In regards to tongue laceration, prescribed chlorhexidine mouthwash to be used twice daily.  Reassess at follow-up.   Patient also had a mild hyponatremia, hypokalemia, leukocytosis and AST.  Hyponatremia and leukocytosis improved.  Hypokalemia replenished prior to discharge.  Mild AST elevation likely from seizure.  Recommend rechecking CMP and  CBC in 1 to 2 weeks.  Patient was evaluated and cleared by therapy prior to discharge.  Assessment and Plan: Principal Problem:   Breakthrough seizure (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[place])   Posttraumatic stress disorder   Tardive dyskinesia   Dyslipidemia   Hypokalemia   Hyponatremia   Elevated AST (SGOT)   Tongue laceration      Consultants: Neurology Procedures performed: None Disposition: Home Diet recommendation:  Discharge Diet Orders (From admission, onward)     Start     Ordered   06/15/22 0000  Diet general        06/15/22 1522           Regular diet DISCHARGE MEDICATION: Allergies as of 06/15/2022       Reactions   Cyclobenzaprine Other (See Comments)   Blisters in mouth   Lamictal [lamotrigine]    Acute renal failure   Ropinirole Nausea And Vomiting   Tramadol Other (See Comments)   Reaction??   Acetaminophen Itching, Swelling, Rash   Codeine Swelling, Rash, Other (See Comments)   Can take hydrocodone   Penicillins    Abdominal pain        Medication List     TAKE these medications    ALPRAZolam 1 MG tablet Commonly known as: XANAX TAKE 1 TABLET BY MOUTH DAILY AS  NEEDED FOR SEVERE ANXIETY Start taking on: June 24, 2022 What changed:  how much to take how to take this when to take this reasons to take this additional instructions   ALPRAZolam 2 MG  24 hr tablet Commonly known as: XANAX XR TAKE 1 TABLET BY MOUTH IN THE  MORNING AND AT BEDTIME Start taking on: July 14, 2022 What changed:  how much to take how to take this when to take this additional instructions   Austedo 12 MG Tabs Generic drug: Deutetrabenazine Take 1 tablet po BID with a 9 mg tablet to equal 21 mg po BID What changed:  how much to take how to take this when to take this additional instructions   Austedo 9 MG Tabs Generic  drug: Deutetrabenazine Take 1 tab po BID with a 12 mg tab to equal 21 mg BID What changed:  how much to take how to take this when to take this additional instructions   carbamazepine 300 MG 12 hr capsule Commonly known as: CARBATROL TAKE 1 CAPSULE BY MOUTH IN  THE MORNING AND 2 CAPSULES  BY MOUTH AT BEDTIME What changed: See the new instructions.   CENTRUM SILVER PO Take 1 tablet by mouth 2 (two) times a week.   cetirizine 10 MG tablet Commonly known as: ZYRTEC Take 10 mg by mouth daily as needed for allergies or rhinitis.   chlorhexidine 0.12 % solution Commonly known as: PERIDEX Use as directed 15 mLs in the mouth or throat 2 (two) times daily.   clonazePAM 1 MG tablet Commonly known as: KLONOPIN Take 1 tablet (1 mg total) by mouth at bedtime. Start taking on: June 17, 2022   docusate sodium 50 MG capsule Commonly known as: COLACE Take 50 mg by mouth daily as needed for mild constipation.   fluticasone 50 MCG/ACT nasal spray Commonly known as: FLONASE Place 1 spray into both nostrils daily as needed for allergies or rhinitis.   levETIRAcetam 500 MG tablet Commonly known as: KEPPRA Take 1 tablet (500 mg total) by mouth 2 (two) times daily.   lovastatin 20 MG tablet Commonly known as: MEVACOR TAKE 1 TABLET BY MOUTH DAILY What changed: when to take this   Melatonin 10 MG Tabs Take 10 mg by mouth at bedtime.   metFORMIN 500 MG tablet Commonly known as: GLUCOPHAGE TAKE 1 TABLET BY MOUTH TWICE  DAILY WITH A MEAL   OLANZapine 7.5 MG tablet Commonly known as: ZYPREXA Take 1 tablet (7.5 mg total) by mouth at bedtime. What changed:  when to take this additional instructions   pantoprazole 40 MG tablet Commonly known as: PROTONIX TAKE 1 TABLET BY MOUTH DAILY AS  NEEDED What changed: when to take this   polyethylene glycol 17 g packet Commonly known as: MIRALAX / GLYCOLAX Take 17 g by mouth daily as needed for mild constipation.   tiZANidine 2 MG  tablet Commonly known as: ZANAFLEX Take 1 tablet (2 mg total) by mouth every 6 (six) hours as needed for muscle spasms.   traZODone 100 MG tablet Commonly known as: DESYREL Take 1-2 tablets (100-200 mg total) by mouth at bedtime as needed. for sleep What changed:  when to take this additional instructions   zolpidem 10 MG tablet Commonly known as: AMBIEN Take 1 tablet (10 mg total) by mouth at bedtime as needed. What changed: when to take this        Follow-up Information     Connect with your PCP/Specialist as discussed. Schedule an appointment as soon as possible for a visit .   Contact information: [email or web address] Call our physician referral line at [phone].        Copland, Gay Filler, MD. Schedule an appointment as soon as possible  for a visit in 1 week(s).   Specialty: Family Medicine Contact information: [place] [id number] [phone]                Discharge Exam: Danley Danker Weights   06/14/22 1100  Weight: 83.9 kg   GENERAL: No apparent distress.  Nontoxic. HEENT: MMM.  Noted right-sided lung laceration.  No bleeding.  Vision and hearing grossly intact.  NECK: Supple.  No apparent JVD.  RESP:  No IWOB.  Fair aeration bilaterally. CVS:  RRR. Heart sounds normal.  ABD/GI/GU: BS+. Abd soft, NTND.  MSK/EXT:  Moves extremities. No apparent deformity. No edema.  SKIN: no apparent skin lesion or wound NEURO: Awake and alert. Oriented appropriately.  No apparent focal neuro deficit. PSYCH: Calm. Normal affect.   Condition at discharge: good  The results of significant diagnostics from this hospitalization (including imaging, microbiology, ancillary and laboratory) are listed below for reference.   Imaging Studies: DG Chest Portable 1 View  Result Date: 06/14/2022 CLINICAL DATA:  Shortness of breath EXAM: PORTABLE CHEST 1 VIEW COMPARISON:  05/18/2022 FINDINGS: The heart size and mediastinal contours are  within normal limits. Both lungs are clear. The visualized skeletal structures are unremarkable. IMPRESSION: No acute abnormality of the lungs in AP portable projection. Electronically Signed   By: Delanna Ahmadi M.D.   On: 06/14/2022 14:00   CT HEAD WO CONTRAST (5MM)  Result Date: 06/14/2022 CLINICAL DATA:  Seizure.  Head trauma, minor. EXAM: CT HEAD WITHOUT CONTRAST TECHNIQUE: Contiguous axial images were obtained from the base of the skull through the vertex without intravenous contrast. RADIATION DOSE REDUCTION: This exam was performed according to the departmental dose-optimization program which includes automated exposure control, adjustment of the mA and/or kV according to patient size and/or use of iterative reconstruction technique. COMPARISON:  None Available. FINDINGS: Brain: The brain shows a normal appearance without evidence of malformation, atrophy, old or acute small or large vessel infarction, mass lesion, hemorrhage, hydrocephalus or extra-axial collection. Vascular: No hyperdense vessel. No evidence of atherosclerotic calcification. Skull: Normal.  No traumatic finding.  No focal bone lesion. Sinuses/Orbits: Sinuses are clear. Orbits appear normal. Mastoids are clear. Other: None significant IMPRESSION: Normal head CT. Electronically Signed   By: Nelson Chimes M.D.   On: 06/14/2022 12:54   US Abdomen Limited RUQ (LIVER/GB)  Result Date: 05/25/2022 CLINICAL DATA:  Elevated alkaline phosphatase EXAM: ULTRASOUND ABDOMEN LIMITED RIGHT UPPER QUADRANT COMPARISON:  None Available. FINDINGS: Gallbladder: No gallstones or wall thickening visualized. No sonographic Murphy sign noted by sonographer. Common bile duct: Diameter: 6 mm Liver: Liver is diffusely increased in echogenicity. Within the right hepatic lobe there is a 9 x 7 x 7 mm complicated cyst. Fatty sparing adjacent to the gallbladder fossa. Portal vein is patent on color Doppler imaging with normal direction of blood flow towards the  liver. Other: None. IMPRESSION: 1. No cholelithiasis or sonographic evidence for acute cholecystitis. 2. Common bile duct upper limits of normal measuring 6 mm. Recommend correlation with LFTs. In the setting of abnormal LFTs, recommend further evaluation with MRI/MRCP. 3. Hepatic steatosis. 4. Complicated cyst within the right hepatic lobe. Electronically Signed   By: Lovey Newcomer M.D.   On: 05/25/2022 12:50   DG Chest 2 View  Result Date: 05/18/2022 CLINICAL DATA:  Chronic cough EXAM: CHEST - 2 VIEW COMPARISON:  06/13/2017 FINDINGS: Cardiac size is within normal limits. Lung fields are clear of any pulmonary edema or focal pulmonary consolidation small transverse linear densities in the  lateral aspect of left lower lung fields suggest scarring or subsegmental atelectasis there is no pleural effusion or pneumothorax. There is interval surgical fusion in lower cervical spine. IMPRESSION: There are no signs of pulmonary edema or focal pulmonary consolidation. Small transverse linear densities in lateral aspect of left lower lung fields suggest minimal scarring or minimal subsegmental atelectasis. Electronically Signed   By: Elmer Picker M.D.   On: 05/18/2022 10:04    Microbiology: Results for orders placed or performed in visit on 08/05/21  Urine Culture     Status: None   Collection Time: 08/05/21  3:55 PM   Specimen: Urine  Result Value Ref Range Status   MICRO NUMBER: [id number]  Final   SPECIMEN QUALITY: Adequate  Final   Sample Source NOT GIVEN  Final   STATUS: FINAL  Final   Result: No Growth  Final    Labs: CBC: Recent Labs  Lab 06/14/22 1229 06/15/22 0457  WBC 18.7* 14.7*  NEUTROABS 16.3*  --   HGB 14.5 14.3  HCT 41.5 41.4  MCV 83.0 84.3  PLT 275 808   Basic Metabolic Panel: Recent Labs  Lab 06/14/22 1229 06/15/22 0457  NA 129* 134*  K 3.4* 3.4*  CL 95* 102  CO2 21* 21*  GLUCOSE 130* 118*  BUN 10 10  CREATININE 0.83 0.77  CALCIUM 9.2 8.9   Liver Function  Tests: Recent Labs  Lab 06/15/22 0457  AST 113*  ALT 38  ALKPHOS 81  BILITOT 0.9  PROT 7.3  ALBUMIN 4.4   CBG: Recent Labs  Lab 06/14/22 2246 06/15/22 0818 06/15/22 1240  GLUCAP 134* 112* 129*    Discharge time spent: greater than 30 minutes.  Signed: Mercy Riding, MD [facility] 06/15/2022

## 2022-06-15 NOTE — ED Notes (Signed)
Patient discharged per MD

## 2022-06-15 NOTE — ED Notes (Signed)
Pt hooked up to monitor.

## 2022-06-15 NOTE — Evaluation (Signed)
Physical Therapy Evaluation Patient Details Name: Theresa [name] MRN: [id number] DOB: 1956/08/23 Today's Date: 06/15/2022  History of Present Illness  66 y/o in ED after seizure activity occurred when she arrived to recieve her colonscopy yesterday 06/14/2022. After seizre she was weak and disoriented. PMH: bipolar, depression, nexk surgery ( 3 screws)  Clinical Impression  Very pleasant patient reports having a seizure yesterday and  it "just takes time" she states for her to start recalling the events and getting back on her feet. Today during general assessment , all within functional limits able to ambulate in hallway with supervision to the bathroom. Pt reports being a little " wqoozy" but no evidence of dizziness or unsteady . She had not eaten prior to her colonoscopy and since this even still has not been able to eat due to a large gash in the right side of her tongue the entire length of the lateral side of her tongue that is very painful with swallowing or attempting to eat.   Husband at bedsie , she will have great support at home upon discharge and no mobility concerns at this time, no further PT or OT needs at this time  Main concern pt has is her tongue and making sure they understand why this occurred.      Recommendations for follow up therapy are one component of a multi-disciplinary discharge planning process, led by the attending physician.  Recommendations may be updated based on patient status, additional functional criteria and insurance authorization.  Follow Up Recommendations No PT follow up      Assistance Recommended at Discharge Intermittent Supervision/Assistance  Patient can return home with the following  A little help with bathing/dressing/bathroom;Direct supervision/assist for medications management    Equipment Recommendations None recommended by PT  Recommendations for Other Services       Functional Status Assessment Patient has had a recent decline  in their functional status and demonstrates the ability to make significant improvements in function in a reasonable and predictable amount of time.     Precautions / Restrictions Precautions Precautions: None      Mobility  Bed Mobility Overal bed mobility: Independent                  Transfers Overall transfer level: Modified independent                      Ambulation/Gait Ambulation/Gait assistance: Supervision Gait Distance (Feet): 60 Feet Assistive device: None Gait Pattern/deviations: WFL(Within Functional Limits)       General Gait Details: slightly slower due to not being up for lat 24 hours, but functional. small LOB in bathroom when turning but self corrected.  Stairs            Wheelchair Mobility    Modified Rankin (Stroke Patients Only)       Balance Overall balance assessment: Modified Independent                                           Pertinent Vitals/Pain Pain Assessment Pain Assessment: 0-10 Pain Score: 4  Pain Location: right side of tongue Pain Descriptors / Indicators: Burning, Sore    Home Living Family/patient expects to be discharged to:: Private residence Living Arrangements: Spouse/significant other Available Help at Discharge: Family             Home Equipment: None  Prior Function Prior Level of Function : Independent/Modified Independent                     Hand Dominance        Extremity/Trunk Assessment        Lower Extremity Assessment Lower Extremity Assessment: Overall WFL for tasks assessed       Communication      Cognition Arousal/Alertness: Awake/alert Behavior During Therapy: WFL for tasks assessed/performed Overall Cognitive Status: Within Functional Limits for tasks assessed                                          General Comments      Exercises     Assessment/Plan    PT Assessment Patient does not need any  further PT services  PT Problem List         PT Treatment Interventions      PT Goals (Current goals can be found in the Care Plan section)  Acute Rehab PT Goals PT Goal Formulation: All assessment and education complete, DC therapy    Frequency       Co-evaluation               AM-PAC PT "6 Clicks" Mobility  Outcome Measure Help needed turning from your back to your side while in a flat bed without using bedrails?: None Help needed moving from lying on your back to sitting on the side of a flat bed without using bedrails?: None Help needed moving to and from a bed to a chair (including a wheelchair)?: None Help needed standing up from a chair using your arms (e.g., wheelchair or bedside chair)?: None Help needed to walk in hospital room?: A Little Help needed climbing 3-5 steps with a railing? : A Little 6 Click Score: 22    End of Session   Activity Tolerance: Patient tolerated treatment well Patient left: in bed (stretcher in eD)   PT Visit Diagnosis: Other abnormalities of gait and mobility (R26.89)    Time: 1430-1453 PT Time Calculation (min) (ACUTE ONLY): 23 min   Charges:   PT Evaluation $PT Eval Low Complexity: 1 Low          Theresa [name], PT, MPT Acute Rehabilitation Services Office: [phone] If a weekend: [facility] w/e pager [phone] 06/15/2022   Theresa [name] 06/15/2022, 3:04 PM

## 2022-06-17 ENCOUNTER — Encounter: Payer: Self-pay | Admitting: Gastroenterology

## 2022-06-18 NOTE — Progress Notes (Unsigned)
[facility] [address] [place], [place] [address] [phone]  Date:  06/23/2022   Name:  Theresa [name]   DOB:  [birth year]   MRN:  [id number]  PCP:  Darreld Mclean, MD    Chief Complaint: No chief complaint on file.   History of Present Illness:  Theresa [name] is a 66 y.o. very pleasant female patient who presents with the following:  Patient seen today for follow-up visit. Most recent visit with myself was for her physical in September-10 her most recent seizure had been approximately June 2022 History of bipolar disorder and epilepsy, tardive dyskinesia, dyslipidemia  Last week she went in for a screening colonoscopy, and suffered a seizure potentially due to being n.p.o. and not taking her routine antiseizure medication and benzodiazepine EMS came and transferred her from endoscopy to the emergency department at [facility].  She was treated and released to home She sent me a message earlier this week, she did suffer a significant bite injury to her tongue which was causing pain and difficulty eating.  I called in pain medication for her Patient Active Problem List   Diagnosis Date Noted   Breakthrough seizure ([place]) 06/15/2022   Hypokalemia 06/15/2022   Hyponatremia 06/15/2022   Elevated AST (SGOT) 06/15/2022   Tongue laceration 06/15/2022   Dyslipidemia 05/05/2020   Manic bipolar I disorder in partial remission ([place]) 07/25/2018   Posttraumatic stress disorder 07/25/2018   Generalized idiopathic epilepsy and epileptic syndromes, not intractable, without status epilepticus ([place]) 06/24/2017   Tardive dyskinesia 01/21/2017   Special screening for malignant neoplasms, colon 06/16/2012   Personal history of colonic polyps 06/16/2012   Bipolar disorder ([place]) 04/02/2012    Past Medical History:  Diagnosis Date   Adenomatous colon polyp    Allergy    Anxiety    Bipolar 1 disorder ([place])    Blood  transfusion without reported diagnosis    Depression    Elevated LFTs    GERD (gastroesophageal reflux disease)    Hepatic steatosis 05/07/2013   Hyperlipidemia    Seizures ([place]) 1977   Last seizure 01/2020   Tardive dyskinesia     Past Surgical History:  Procedure Laterality Date   APPENDECTOMY     COLONOSCOPY  06/16/2012   Procedure: COLONOSCOPY;  Surgeon: Lafayette Dragon, MD;  Location: [facility];  Service: Endoscopy;  Laterality: N/A;   DILATION AND CURETTAGE OF UTERUS     KNEE ARTHROSCOPY WITH MENISCAL REPAIR Left    NECK SURGERY     plate with screws to 3 cervical spines    Social History   Tobacco Use   Smoking status: Never   Smokeless tobacco: Never  Vaping Use   Vaping Use: Never used  Substance Use Topics   Alcohol use: Yes    Comment: occasionaly 1 a month   Drug use: Never    Family History  Problem Relation Age of Onset   Colon polyps Mother    Cancer Mother    Heart disease Mother    Alcoholism Mother    Colon polyps Father    Cancer Father    Alcoholism Father    Bipolar disorder Father    Drug abuse Brother    Bipolar disorder Other    Colon cancer Neg Hx    Stomach cancer Neg Hx    Breast cancer Neg Hx    Esophageal cancer Neg Hx    Rectal  cancer Neg Hx     Allergies  Allergen Reactions   Cyclobenzaprine Other (See Comments)    Blisters in mouth   Lamictal [Lamotrigine]     Acute renal failure   Ropinirole Nausea And Vomiting   Tramadol Other (See Comments)    Reaction??   Acetaminophen Itching, Swelling and Rash   Codeine Swelling, Rash and Other (See Comments)    Can take hydrocodone   Penicillins     Abdominal pain    Medication list has been reviewed and updated.  Current Outpatient Medications on File Prior to Visit  Medication Sig Dispense Refill   [START ON 07/14/2022] ALPRAZolam (XANAX XR) 2 MG 24 hr tablet TAKE 1 TABLET BY MOUTH IN THE  MORNING AND AT BEDTIME (Patient taking differently: Take 2 mg by mouth in the  morning and at bedtime.) 180 tablet 1   [START ON 06/24/2022] ALPRAZolam (XANAX) 1 MG tablet TAKE 1 TABLET BY MOUTH DAILY AS  NEEDED FOR SEVERE ANXIETY (Patient taking differently: Take 1 mg by mouth daily as needed ("for severe anxiety").) 30 tablet 3   carbamazepine (CARBATROL) 300 MG 12 hr capsule TAKE 1 CAPSULE BY MOUTH IN  THE MORNING AND 2 CAPSULES  BY MOUTH AT BEDTIME (Patient taking differently: Take 300-600 mg by mouth See admin instructions. Take 300 mg by mouth in the morning and 600 mg at bedtime) 300 capsule 2   cetirizine (ZYRTEC) 10 MG tablet Take 10 mg by mouth daily as needed for allergies or rhinitis.     chlorhexidine (PERIDEX) 0.12 % solution Use as directed 15 mLs in the mouth or throat 2 (two) times daily. 473 mL 0   clonazePAM (KLONOPIN) 1 MG tablet Take 1 tablet (1 mg total) by mouth at bedtime. 7 tablet 0   Deutetrabenazine (AUSTEDO) 12 MG TABS Take 1 tablet po BID with a 9 mg tablet to equal 21 mg po BID (Patient taking differently: Take 12 mg by mouth 2 (two) times daily.) 60 tablet 5   Deutetrabenazine (AUSTEDO) 9 MG TABS Take 1 tab po BID with a 12 mg tab to equal 21 mg BID (Patient taking differently: Take 9 mg by mouth 2 (two) times daily.) 60 tablet 5   docusate sodium (COLACE) 50 MG capsule Take 50 mg by mouth daily as needed for mild constipation.     fluticasone (FLONASE) 50 MCG/ACT nasal spray Place 1 spray into both nostrils daily as needed for allergies or rhinitis.     levETIRAcetam (KEPPRA) 500 MG tablet Take 1 tablet (500 mg total) by mouth 2 (two) times daily. 180 tablet 3   lovastatin (MEVACOR) 20 MG tablet TAKE 1 TABLET BY MOUTH DAILY (Patient taking differently: Take 20 mg by mouth daily in the afternoon.) 90 tablet 3   Melatonin 10 MG TABS Take 10 mg by mouth at bedtime.     metFORMIN (GLUCOPHAGE) 500 MG tablet TAKE 1 TABLET BY MOUTH TWICE  DAILY WITH A MEAL (Patient taking differently: Take 500 mg by mouth 2 (two) times daily with a meal.) 180 tablet 3    Multiple Vitamins-Minerals (CENTRUM SILVER PO) Take 1 tablet by mouth 2 (two) times a week.     OLANZapine (ZYPREXA) 7.5 MG tablet Take 1 tablet (7.5 mg total) by mouth at bedtime. (Patient taking differently: Take 7.5 mg by mouth See admin instructions. Take 7.5 mg by mouth at bedtime and increase to 10 mg as directed for seasonal mood changes) 90 tablet 0   pantoprazole (PROTONIX) 40 MG  tablet TAKE 1 TABLET BY MOUTH DAILY AS  NEEDED (Patient taking differently: Take 40 mg by mouth daily.) 90 tablet 3   polyethylene glycol (MIRALAX / GLYCOLAX) 17 g packet Take 17 g by mouth daily as needed for mild constipation.     tiZANidine (ZANAFLEX) 2 MG tablet Take 1 tablet (2 mg total) by mouth every 6 (six) hours as needed for muscle spasms. 30 tablet 1   traZODone (DESYREL) 100 MG tablet Take 1-2 tablets (100-200 mg total) by mouth at bedtime as needed. for sleep (Patient taking differently: Take 100-200 mg by mouth See admin instructions. Take 100 mg by mouth at bedtime as needed for sleep and an additional 100 mg once nightly, if no relief from from sleeplessness) 180 tablet 3   zolpidem (AMBIEN) 10 MG tablet Take 1 tablet (10 mg total) by mouth at bedtime as needed. (Patient taking differently: Take 10 mg by mouth at bedtime.) 7 tablet 0   No current facility-administered medications on file prior to visit.    Review of Systems:  As per HPI- otherwise negative.   Physical Examination: There were no vitals filed for this visit. There were no vitals filed for this visit. There is no height or weight on file to calculate BMI. Ideal Body Weight:    GEN: no acute distress. HEENT: Atraumatic, Normocephalic.  Ears and Nose: No external deformity. CV: RRR, No M/G/R. No JVD. No thrill. No extra heart sounds. PULM: CTA B, no wheezes, crackles, rhonchi. No retractions. No resp. distress. No accessory muscle use. ABD: S, NT, ND, +BS. No rebound. No HSM. EXTR: No c/c/e PSYCH: Normally interactive.  Conversant.    Assessment and Plan: ***  Signed Lamar Blinks, MD

## 2022-06-21 ENCOUNTER — Encounter: Payer: Self-pay | Admitting: Family Medicine

## 2022-06-21 ENCOUNTER — Other Ambulatory Visit: Payer: Self-pay

## 2022-06-21 DIAGNOSIS — S01502A Unspecified open wound of oral cavity, initial encounter: Secondary | ICD-10-CM

## 2022-06-21 DIAGNOSIS — F5101 Primary insomnia: Secondary | ICD-10-CM

## 2022-06-21 DIAGNOSIS — F4310 Post-traumatic stress disorder, unspecified: Secondary | ICD-10-CM

## 2022-06-21 MED ORDER — CLONAZEPAM 1 MG PO TABS: 1.0000 mg | ORAL_TABLET | Freq: Every day | ORAL | 1 refills | Status: DC

## 2022-06-21 MED ORDER — HYDROCODONE-IBUPROFEN 5-200 MG PO TABS: 1.0000 | ORAL_TABLET | Freq: Three times a day (TID) | ORAL | 0 refills | Status: DC | PRN

## 2022-06-21 NOTE — Addendum Note (Signed)
Addended by: Lamar Blinks C on: 06/21/2022 09:24 AM   Modules accepted: Orders

## 2022-06-21 NOTE — Telephone Encounter (Signed)
Pt's husband LVM at 12:39.  After checking with mail order, they say they are backordered until Dec.  He wants the remaining script sent to [facility] on [address].  Next appt 11/21

## 2022-06-23 ENCOUNTER — Ambulatory Visit (INDEPENDENT_AMBULATORY_CARE_PROVIDER_SITE_OTHER): Payer: Medicare Other | Admitting: Family Medicine

## 2022-06-23 VITALS — BP 140/90 | HR 68 | Temp 97.8°F | Resp 18 | Ht 66.0 in | Wt 178.2 lb

## 2022-06-23 DIAGNOSIS — G40919 Epilepsy, unspecified, intractable, without status epilepticus: Secondary | ICD-10-CM

## 2022-06-23 DIAGNOSIS — S01502A Unspecified open wound of oral cavity, initial encounter: Secondary | ICD-10-CM

## 2022-06-23 NOTE — Patient Instructions (Addendum)
I am sorry you had this seizure but I think it was due to the situation Let me know if you don't continue to get better Your tongue seems to be healing ok

## 2022-06-24 ENCOUNTER — Other Ambulatory Visit: Payer: Self-pay | Admitting: Family Medicine

## 2022-06-24 ENCOUNTER — Encounter: Payer: Self-pay | Admitting: Family Medicine

## 2022-06-24 DIAGNOSIS — Z1211 Encounter for screening for malignant neoplasm of colon: Secondary | ICD-10-CM

## 2022-06-24 NOTE — Progress Notes (Signed)
I heard back from patient's gastroenterologist, okay to order Cologuard.  If this should be positive then inpatient colonoscopy can be done Will update patient

## 2022-06-28 ENCOUNTER — Ambulatory Visit (INDEPENDENT_AMBULATORY_CARE_PROVIDER_SITE_OTHER): Payer: Medicare Other | Admitting: Psychiatry

## 2022-06-28 DIAGNOSIS — F319 Bipolar disorder, unspecified: Secondary | ICD-10-CM

## 2022-06-28 NOTE — Progress Notes (Signed)
[facility] Counselor/Therapist Progress Note  Patient ID: Theresa [name], MRN: [id number],    Date: 06/28/2022  Time Spent: 50 minutes   Treatment Type: Individual Therapy       Reported Symptoms: anxiety("affected by grand mal seiure"), some depression, worried about having another seizure         Mental Status Exam:  Appearance:   Casual     Behavior:  Appropriate, Sharing, and Motivated  Motor:  Normal  Speech/Language:   Clear and Coherent  Affect:  Depressed and anxious  Mood:  anxious and depressed  Thought process:  goal directed  Thought content:    "A few obsessive thoughts mostly re: bit tongue from seizure recently"   Sensory/Perceptual disturbances:    WNL  Orientation:  oriented to person, place, time/date, situation, day of week, month of year, year, and stated date of Nov. 6, 2023  Attention:  Fair  Concentration:  Fair  Memory:  Some short term memory issues  Fund of knowledge:   Good  Insight:    Good and Fair  Judgment:   Good  Impulse Control:  Good   Risk Assessment: Danger to Self:  No Self-injurious Behavior: No Danger to Others: No Duty to Warn:no Physical Aggression / Violence:No  Access to Firearms a concern: No  Gang Involvement:No   Subjective:    Patient in today reporting anxiety and "I had a grand mal seizure about 2 wks ago when she had gone for a colonoscopy. Described this process and her concerns. Kept her a couple days in hospital and "ok now". Reports a few obsessive thoughts re: biting her tongue during seizure. 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 Still some disappointment that "my tardive dyskinesia interferes some with my painting" and processed this more today, and also planning to try different type of painting  that she feels she will be able to do. Sleep has been good. Still working at setting healthier limits with a friend who tends to call patient at times when friend has been drinking. Processed her setting limits with others as needed as being a significant skill for patient to have especially with some of her history. Feeling good about her history of not cutting herself for over 10 years "and I have no desire to cut". "Try to think about my gains to get an idea of where I'm at in my life, and don't want to dwell on all the negatives all the time, and just deal with what I need to deal with."   Interventions: Cognitive Behavioral Therapy and Ego-Supportive  Treatment goals: Treatment goals remain on treatment plan as patient works with strategies to achieve her goals. Progress is assessed each session and is documented in treatment note. Long term goal: Reduce overall level, frequency, and intensity of the anxiety so that daily functioning is not impaired. Short-term goal: Increase understanding of the beliefs and messages that produce anxiety, depression, and worry. Strategies: Identify , challenge, and replace anxious/negative/depressive self-talk with positive, realistic, and empowering self talk  Diagnosis:   ICD-10-CM   1. Bipolar affective disorder, remission status unspecified ([place])  F31.9      Plan:  Patient today showing active participation and good motivation in session. Does have some "down feelings"  when I can't  do some things I used to be able to do.  Also acknowledged that she is learning to find other things "that I can do" just like how she is substituting the types of paining that she is going to be doing going forward.  Encourage patient also in doing some journaling her note taking between sessions that might help guide some of her thoughts in sessions.  Feeling like she is "getting a little more back to normal" after having had the recent seizure and being in the hospital a  couple of days. Review of goal-directed behaviors and remains motivated. Encourage patient and practicing positive behaviors as noted in session including: Feeling encouraged by some of the progress she is making in spite of difficult challenges, refrain from assuming that things are going in a negative direction, follow-up with behaviors that can help her with more regular sleep as discussed in session and with her med provider, believing more in her ability to make changes and maintain them, focus more on the things she can change versus cannot, use of positive self talk, remain in the present rather than going to the past or too far into the future, maintaining good boundaries with others, stay in touch with people who are supportive, participate in activities that bring her pleasure as she is able with her pets and grandchildren and painting, look for positives within herself, get outside as she is able, healthy nutrition and exercise, and realize the strength she shows when working with goal-directed behaviors to move in a direction that supports her improved emotional health.  Goal review and progress/challenges noted with patient.  Next appointment within 2 to 3 weeks.  This record has been created using [employer].  Chart creation errors have been sought, but may not always have been located and corrected.  Such creation errors do not reflect on the standard of medical care provided.   Shanon Ace, LCSW

## 2022-06-29 ENCOUNTER — Encounter: Payer: Self-pay | Admitting: Family Medicine

## 2022-06-29 MED ORDER — HYDROCODONE-IBUPROFEN 5-200 MG PO TABS: 1.0000 | ORAL_TABLET | Freq: Three times a day (TID) | ORAL | 0 refills | Status: DC | PRN

## 2022-07-01 DIAGNOSIS — Z1211 Encounter for screening for malignant neoplasm of colon: Secondary | ICD-10-CM | POA: Diagnosis not present

## 2022-07-05 ENCOUNTER — Ambulatory Visit
Admission: RE | Admit: 2022-07-05 | Discharge: 2022-07-05 | Disposition: A | Discharge status: Home / Self Care | Payer: Medicare Other | Source: Ambulatory Visit | Attending: Obstetrics and Gynecology | Admitting: Obstetrics and Gynecology

## 2022-07-05 DIAGNOSIS — Z1231 Encounter for screening mammogram for malignant neoplasm of breast: Secondary | ICD-10-CM | POA: Diagnosis not present

## 2022-07-09 ENCOUNTER — Encounter: Payer: Self-pay | Admitting: Family Medicine

## 2022-07-09 LAB — COLOGUARD
COLOGUARD: NEGATIVE
Cologuard: NEGATIVE

## 2022-07-13 ENCOUNTER — Ambulatory Visit (INDEPENDENT_AMBULATORY_CARE_PROVIDER_SITE_OTHER): Payer: Medicare Other | Admitting: Psychiatry

## 2022-07-13 ENCOUNTER — Encounter: Payer: Self-pay | Admitting: Psychiatry

## 2022-07-13 DIAGNOSIS — F319 Bipolar disorder, unspecified: Secondary | ICD-10-CM | POA: Diagnosis not present

## 2022-07-13 DIAGNOSIS — F5101 Primary insomnia: Secondary | ICD-10-CM

## 2022-07-13 DIAGNOSIS — G40309 Generalized idiopathic epilepsy and epileptic syndromes, not intractable, without status epilepticus: Secondary | ICD-10-CM | POA: Diagnosis not present

## 2022-07-13 DIAGNOSIS — F4310 Post-traumatic stress disorder, unspecified: Secondary | ICD-10-CM

## 2022-07-13 MED ORDER — ZOLPIDEM TARTRATE 10 MG PO TABS: 10.0000 mg | ORAL_TABLET | Freq: Every evening | ORAL | 5 refills | Status: DC | PRN

## 2022-07-13 MED ORDER — OLANZAPINE 7.5 MG PO TABS: 7.5000 mg | ORAL_TABLET | Freq: Every day | ORAL | 0 refills | Status: DC

## 2022-07-13 MED ORDER — CLONAZEPAM 1 MG PO TABS: 1.0000 mg | ORAL_TABLET | Freq: Every day | ORAL | 2 refills | Status: DC

## 2022-07-13 MED ORDER — CARBAMAZEPINE ER 300 MG PO CP12: ORAL_CAPSULE | ORAL | 1 refills | Status: DC

## 2022-07-13 MED ORDER — ALPRAZOLAM ER 2 MG PO TB24: ORAL_TABLET | ORAL | 1 refills | Status: DC

## 2022-07-13 MED ORDER — ALPRAZOLAM 1 MG PO TABS: ORAL_TABLET | ORAL | 3 refills | Status: DC

## 2022-07-13 MED ORDER — TRAZODONE HCL 100 MG PO TABS: 100.0000 mg | ORAL_TABLET | Freq: Every evening | ORAL | 3 refills | Status: DC | PRN

## 2022-07-13 NOTE — Progress Notes (Signed)
[name] [id number] 11-Feb-1956 66 y.o.  Subjective:   Patient ID:  Theresa [name] is a 66 y.o. (DOB 1955-09-26) female.  Chief Complaint:  Chief Complaint  Patient presents with   Follow-up    Anxiety, Bipolar D/O, and insomnia    HPI Theresa [name] presents to the office today for follow-up of anxiety, Bipolar Disorder, and insomnia.   She had a seizure 06/14/22. Husband reports that it may have been related to prep for colonoscopy, vomiting, and not taking medications. She had a grand mal seizure when going in for colonoscopy. She reports that she chipped a tooth and bit her tongue due to seizure activity.   She reports that her mental status has not fully returned to baseline, and husband is in agreement with this.   Husband reports that she has had poor sleep, which has been typical for her following a seizure. They report that other times she is sleeping excessively and went to bed at 7 pm. Husband notices she has some increased goal-directed activity and some increased goal-directed activity. She reports that her energy has been low. She has tried to vacuumed a couple of times. Anxiety has been ok. Denies recent panic attacks. Appetite has been ok. She reports that she has been having to eat soft foods only after chipped tooth and tongue injury. Does not recall any suicidal thoughts.   Xanax 1 mg last filled 06/25/22. Xanax XR last filled 04/13/22. Klonopin 1 mg last filled 06/21/22. Ambien last filled 06/15/22 x 3  Past Psychiatric Medication Trials: Olanzapine Seroquel Saphris Depakote-self-injurious behavior Lamictal Keppra Carbamazepine Xanax Klonopin Ambien Trazodone Benztropine Ingrezza Propranolol- Ineffective    AIMS    Flowsheet Row Office Visit from 05/20/2022 in [place] Visit from 10/13/2021 in [place] Visit from 08/04/2021 in [place] Visit from 03/09/2021 in  [place] Visit from 09/30/2020 in [place] Total Score '8 6 9 10 9      '$ [place] Office Visit from 02/28/2017 in [place] at [facility]  Total Score (max 30 points ) 29      PHQ2-9    Terry from 05/27/2022 in [facility] at [place] Visit from 05/17/2022 in [facility] [place] Visit from 02/28/2017 in [facility] at [place] Visit from 09/19/2015 in [facility] at [place] from 06/05/2015 in [facility] at [facility] Total Score 0 1 0 0 0        Review of Systems:  Review of Systems  HENT:         Tongue remains sore after seizure  Musculoskeletal:  Negative for gait problem.  Neurological:  Positive for tremors.  Psychiatric/Behavioral:         Please refer to HPI    Medications: I have reviewed the patient's current medications.  Current Outpatient Medications  Medication Sig Dispense Refill   [START ON 07/14/2022] ALPRAZolam (XANAX XR) 2 MG 24 hr tablet TAKE 1 TABLET BY MOUTH IN THE  MORNING AND AT BEDTIME 180 tablet 1   [START ON 07/23/2022] ALPRAZolam (XANAX) 1 MG tablet TAKE 1 TABLET BY MOUTH DAILY AS  NEEDED FOR SEVERE ANXIETY 30 tablet 3   carbamazepine (CARBATROL) 300 MG 12 hr capsule Take 1 capsule (300 mg total) by mouth every morning AND  2 capsules (600 mg total) at bedtime. 270 capsule 1   cetirizine (ZYRTEC) 10 MG tablet Take 10 mg by mouth daily as needed for allergies or rhinitis.     chlorhexidine (PERIDEX) 0.12 % solution Use as directed 15 mLs in the mouth or throat 2 (two) times daily. 473 mL 0   [START ON 08/16/2022] clonazePAM (KLONOPIN) 1 MG tablet Take 1 tablet (1 mg total) by mouth at bedtime. 30 tablet 2   Deutetrabenazine (AUSTEDO) 12 MG TABS Take 1 tablet po BID with a 9 mg tablet to equal 21 mg po BID  (Patient taking differently: Take 12 mg by mouth 2 (two) times daily.) 60 tablet 5   Deutetrabenazine (AUSTEDO) 9 MG TABS Take 1 tab po BID with a 12 mg tab to equal 21 mg BID (Patient taking differently: Take 9 mg by mouth 2 (two) times daily.) 60 tablet 5   docusate sodium (COLACE) 50 MG capsule Take 50 mg by mouth daily as needed for mild constipation.     fluticasone (FLONASE) 50 MCG/ACT nasal spray Place 1 spray into both nostrils daily as needed for allergies or rhinitis.     hydrocodone-ibuprofen (VICOPROFEN) 5-200 MG tablet Take 1 tablet by mouth every 8 (eight) hours as needed for pain. 20 tablet 0   levETIRAcetam (KEPPRA) 500 MG tablet Take 1 tablet (500 mg total) by mouth 2 (two) times daily. 180 tablet 3   lovastatin (MEVACOR) 20 MG tablet TAKE 1 TABLET BY MOUTH DAILY (Patient taking differently: Take 20 mg by mouth daily in the afternoon.) 90 tablet 3   Melatonin 10 MG TABS Take 10 mg by mouth at bedtime.     metFORMIN (GLUCOPHAGE) 500 MG tablet TAKE 1 TABLET BY MOUTH TWICE  DAILY WITH A MEAL (Patient taking differently: Take 500 mg by mouth 2 (two) times daily with a meal.) 180 tablet 3   Multiple Vitamins-Minerals (CENTRUM SILVER PO) Take 1 tablet by mouth 2 (two) times a week.     OLANZapine (ZYPREXA) 7.5 MG tablet Take 1 tablet (7.5 mg total) by mouth at bedtime. 90 tablet 0   pantoprazole (PROTONIX) 40 MG tablet TAKE 1 TABLET BY MOUTH DAILY AS  NEEDED (Patient taking differently: Take 40 mg by mouth daily.) 90 tablet 3   polyethylene glycol (MIRALAX / GLYCOLAX) 17 g packet Take 17 g by mouth daily as needed for mild constipation.     tiZANidine (ZANAFLEX) 2 MG tablet Take 1 tablet (2 mg total) by mouth every 6 (six) hours as needed for muscle spasms. 30 tablet 1   traZODone (DESYREL) 100 MG tablet Take 1-2 tablets (100-200 mg total) by mouth at bedtime as needed. for sleep 180 tablet 3   zolpidem (AMBIEN) 10 MG tablet Take 1 tablet (10 mg total) by mouth at bedtime as needed. 30  tablet 5   No current facility-administered medications for this visit.    Medication Side Effects: Other: tremor  Allergies:  Allergies  Allergen Reactions   Cyclobenzaprine Other (See Comments)    Blisters in mouth   Lamictal [Lamotrigine]     Acute renal failure   Ropinirole Nausea And Vomiting   Tramadol Other (See Comments)    Reaction??   Acetaminophen Itching, Swelling and Rash   Codeine Swelling, Rash and Other (See Comments)    Can take hydrocodone   Penicillins     Abdominal pain    Past Medical History:  Diagnosis Date   Adenomatous colon polyp    Allergy  Anxiety    Bipolar 1 disorder ([place])    Blood transfusion without reported diagnosis    Depression    Elevated LFTs    GERD (gastroesophageal reflux disease)    Hepatic steatosis 05/07/2013   Hyperlipidemia    Seizures ([place]) 1977   Last seizure 01/2020   Tardive dyskinesia     Past Medical History, Surgical history, Social history, and Family history were reviewed and updated as appropriate.   Please see review of systems for further details on the patient's review from today.   Objective:   Physical Exam:  There were no vitals taken for this visit.  Physical Exam Constitutional:      General: She is not in acute distress. Musculoskeletal:        General: No deformity.  Neurological:     Mental Status: She is alert and oriented to person, place, and time.     Coordination: Coordination normal.  Psychiatric:        Attention and Perception: Attention and perception normal. She does not perceive auditory or visual hallucinations.        Mood and Affect: Mood normal. Mood is not anxious or depressed. Affect is not labile, blunt, angry or inappropriate.        Speech: Speech normal.        Behavior: Behavior normal.        Thought Content: Thought content normal. Thought content is not paranoid or delusional. Thought content does not include homicidal or suicidal ideation. Thought content does  not include homicidal or suicidal plan.        Cognition and Memory: Cognition normal.        Judgment: Judgment normal.     Comments: Insight intact She is unable to recall some recent events surrounding seizure activity     Lab Review:     Component Value Date/Time   NA 134 (L) 06/15/2022 0457   K 3.4 (L) 06/15/2022 0457   CL 102 06/15/2022 0457   CO2 21 (L) 06/15/2022 0457   GLUCOSE 118 (H) 06/15/2022 0457   BUN 10 06/15/2022 0457   CREATININE 0.77 06/15/2022 0457   CREATININE 0.79 05/08/2020 1015   CALCIUM 8.9 06/15/2022 0457   PROT 7.3 06/15/2022 0457   ALBUMIN 4.4 06/15/2022 0457   AST 113 (H) 06/15/2022 0457   ALT 38 06/15/2022 0457   ALKPHOS 81 06/15/2022 0457   BILITOT 0.9 06/15/2022 0457   GFRNONAA >60 06/15/2022 0457       Component Value Date/Time   WBC 14.7 (H) 06/15/2022 0457   RBC 4.91 06/15/2022 0457   HGB 14.3 06/15/2022 0457   HCT 41.4 06/15/2022 0457   PLT 247 06/15/2022 0457   MCV 84.3 06/15/2022 0457   MCV 87.7 02/07/2013 1110   MCH 29.1 06/15/2022 0457   MCHC 34.5 06/15/2022 0457   RDW 12.9 06/15/2022 0457   LYMPHSABS 1.1 06/14/2022 1229   MONOABS 1.2 (H) 06/14/2022 1229   EOSABS 0.0 06/14/2022 1229   BASOSABS 0.0 06/14/2022 1229    No results found for: "POCLITH", "LITHIUM"   Lab Results  Component Value Date   CBMZ 5.2 02/01/2019     .res Assessment: Plan:    Pt seen for 30 minutes and time spent discussing recent seizure activity and agreed that his was most likely caused by interruption in anticonvulsants and benzodiazepines and compounded by vomiting with colonoscopy prep. Discussed benzodiazepine withdrawal s/s and that this can also cause mental status changes.  Pt and her husband  report that her mental status has been improving and not fully returned to baseline. Agreed to continue current medications without changes while mental status is still returning to baseline. She and her husband would like to move all of her medications  from [facility] mail order to their local [facility]. Will request that staff cancel any remaining refills from this provider that are on file at [facility] to assist with this transition. Scripts sent to local pharmacy. Pt to follow-up with this provider in 6-8 weeks or sooner if clinically indicated.  Recommend continuing therapy with Rinaldo Cloud, LCSW.  Patient advised to contact office with any questions, adverse effects, or acute worsening in signs and symptoms.    Aliannah was seen today for follow-up.  Diagnoses and all orders for this visit:  Posttraumatic stress disorder -     ALPRAZolam (XANAX) 1 MG tablet; TAKE 1 TABLET BY MOUTH DAILY AS  NEEDED FOR SEVERE ANXIETY -     ALPRAZolam (XANAX XR) 2 MG 24 hr tablet; TAKE 1 TABLET BY MOUTH IN THE  MORNING AND AT BEDTIME -     clonazePAM (KLONOPIN) 1 MG tablet; Take 1 tablet (1 mg total) by mouth at bedtime.  Primary insomnia -     clonazePAM (KLONOPIN) 1 MG tablet; Take 1 tablet (1 mg total) by mouth at bedtime. -     traZODone (DESYREL) 100 MG tablet; Take 1-2 tablets (100-200 mg total) by mouth at bedtime as needed. for sleep -     zolpidem (AMBIEN) 10 MG tablet; Take 1 tablet (10 mg total) by mouth at bedtime as needed.  Bipolar affective disorder, remission status unspecified (HCC) -     OLANZapine (ZYPREXA) 7.5 MG tablet; Take 1 tablet (7.5 mg total) by mouth at bedtime.  Generalized idiopathic epilepsy and epileptic syndromes, not intractable, without status epilepticus (HCC) -     carbamazepine (CARBATROL) 300 MG 12 hr capsule; Take 1 capsule (300 mg total) by mouth every morning AND 2 capsules (600 mg total) at bedtime.     Please see After Visit Summary for patient specific instructions.  Future Appointments  Date Time Provider [place]  07/26/2022 11:00 AM Shanon Ace, LCSW [facility] None  08/26/2022 11:00 AM Shanon Ace, LCSW [facility] None  08/31/2022 11:00 AM [name], PMHNP [facility] None  09/23/2022 11:00 AM Shanon Ace,  LCSW [facility] None  09/30/2022 10:00 AM Cameron Sprang, MD [facility] None    No orders of the defined types were placed in this encounter.   -------------------------------

## 2022-07-14 ENCOUNTER — Other Ambulatory Visit: Payer: Self-pay | Admitting: Neurology

## 2022-07-14 DIAGNOSIS — G40309 Generalized idiopathic epilepsy and epileptic syndromes, not intractable, without status epilepticus: Secondary | ICD-10-CM

## 2022-07-17 ENCOUNTER — Other Ambulatory Visit: Payer: Self-pay | Admitting: Psychiatry

## 2022-07-17 DIAGNOSIS — F319 Bipolar disorder, unspecified: Secondary | ICD-10-CM

## 2022-07-26 ENCOUNTER — Ambulatory Visit: Payer: Medicare Other | Admitting: Psychiatry

## 2022-07-26 DIAGNOSIS — F319 Bipolar disorder, unspecified: Secondary | ICD-10-CM
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
## 2022-08-11 ENCOUNTER — Other Ambulatory Visit: Payer: Self-pay | Admitting: Psychiatry

## 2022-08-12 NOTE — Telephone Encounter (Signed)
Ok to send

## 2022-08-26 ENCOUNTER — Ambulatory Visit (INDEPENDENT_AMBULATORY_CARE_PROVIDER_SITE_OTHER): Payer: Medicare Other | Admitting: Psychiatry

## 2022-08-26 DIAGNOSIS — F319 Bipolar disorder, unspecified: Secondary | ICD-10-CM | POA: Diagnosis not present

## 2022-08-26 NOTE — Progress Notes (Signed)
[facility] Counselor/Therapist Progress Note  Patient ID: Theresa [name], MRN: [id number],    Date: 08/26/2022  Time Spent: 48 minutes   Treatment Type: Individual Therapy  Reported Symptoms: "more manic past 2 days", "happier, my mood is better".  States she's been told to call med provider if I've had 3 nights with 2 hrs or less sleep.   Mental Status Exam:  Appearance:   Casual and Neat     Behavior:  Sharing, Motivated, and some increased talking that she attributes to being "a little more manic."   Motor:  Some restlessness but not excessive  Speech/Language:   Rate of speech is about normal but states "I don't always think of my words since seizure"  Affect:  Congruent  Mood:  "Pretty good right now, but some mania within last day and don't always get worse  Thought process:  Some flight of ideas ; "no dangerous thoughts"  Thought content:    Mania began today "but is not my worst", "more of a medium day re: mania"  Sensory/Perceptual disturbances:    WNL  Orientation:  oriented to person, place, time/date, situation, day of week, month of year, year, and stated date of Jan. 4, 2024  Attention:  Good  Concentration:  Good and Fair  Memory:  "Not good" and that still relates to Oct 23 seizure "and the effects tend to last a long time for  me."   Fund of knowledge:   Good and Fair  Insight:    Good and Fair  Judgment:   Good  Impulse Control:  Fair   Risk Assessment: Danger to Self:  No Self-injurious Behavior: No Danger to Others: No Duty to Warn:no Physical Aggression / Violence:No  Access to Firearms a concern: No  Gang Involvement:No   Subjective:  Patient states "even though I've been manic this morning, wasn't this was yesterday,and I don't always get worse." "Sometimes I go back down."Trying to be more active, working out on stationery bike, walking my dog." Trying to get back to where she was prior to October seizure. Denies any SI. 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 "Sad about it, but definitely not depressed about it."  Interventions: Cognitive Behavioral Therapy and Ego-Supportive  Long term goal: Reduce overall level, frequency, and intensity of the anxiety so that daily functioning is not impaired. Short-term goal: Increase understanding of the beliefs and messages that produce anxiety, depression, and worry. Strategies: Identify , challenge, and replace anxious/negative/depressive self-talk with positive, realistic, and empowering self talk  Diagnosis:   ICD-10-CM   1. Bipolar 1 disorder ([place])  F31.9      Plan:  Continues moving beyond her seizure which has been a journey for patient and she is still working on it. Gets "frustrated at times when I can't come up with a specific word, when I want to do things that I really can't do yet." Some things that help are walking her dogs, reading certain books of interest, continued setting of limits with 2 of her friends who's contacts can at times be overwhelming, and continued boundary setting.  Encouraged her contact with  people who do not drain her and who are helpful for her in a supportive way.  Also encouraged her and her new venture of wanting to learn the "cold wax painting" as described above, especially since oil painting has been so therapeutic for her in the past and she is not able to do it physically at this point.  The other type of painting does not require a lot of hand and finger work as she describes it, so it would be more doable for patient.  Showing progress as she manages her ups and downs and tries to stay into with what she is experiencing,  knowing when to contact someone for help particularly her med provider, as well as strategies she can use that has proven to be helpful. Encouraged patient in practicing positive behaviors as discussed in session including: Feel more encouraged by some of the progress she is making with difficult challenges, refrain from assuming things are going in a negative direction, follow through with behaviors that can help her with more regular sleep as discussed in session, believe more in her ability to make changes and maintain them, focus more on the things she can change versus cannot, positive self talk, remain in the present rather than going to the past or too far into the future, good boundaries with others, staying in touch with people who are supportive, participate in more activities that bring her pleasure as she is able with her pets and grandchildren and in hobbies at home, looking for more positives within herself, getting outside some each day, healthy nutrition and exercise, and realize the strength she shows working with goal-directed behaviors to move in a direction that supports her improved emotional health and outlook.  Goal review and progress/challenges noted with patient.  Next appt within 3-4 weeks.  This record has been created using [employer].  Chart creation errors have been sought, but may not always have been located and corrected.  Such creation errors do not reflect on the standard of medical care provided.   Shanon Ace, LCSW

## 2022-08-31 ENCOUNTER — Ambulatory Visit: Payer: Medicare Other | Admitting: Psychiatry

## 2022-09-01 ENCOUNTER — Ambulatory Visit: Payer: Medicare Other | Admitting: Psychiatry

## 2022-09-01 ENCOUNTER — Encounter: Payer: Self-pay | Admitting: Psychiatry

## 2022-09-01 VITALS — Wt 181.0 lb

## 2022-09-01 DIAGNOSIS — F5101 Primary insomnia: Secondary | ICD-10-CM | POA: Diagnosis not present

## 2022-09-01 DIAGNOSIS — F319 Bipolar disorder, unspecified: Secondary | ICD-10-CM

## 2022-09-01 DIAGNOSIS — F4310 Post-traumatic stress disorder, unspecified: Secondary | ICD-10-CM | POA: Diagnosis not present

## 2022-09-01 MED ORDER — LYBALVI 10-10 MG PO TABS: 1.0000 | ORAL_TABLET | Freq: Every day | ORAL | 0 refills | Status: DC

## 2022-09-01 NOTE — Progress Notes (Signed)
Theresa [name] [id number] [birth year] 67 y.o.  Subjective:   Patient ID:  Theresa [name] is a 67 y.o. (DOB [birth year]) female.  Chief Complaint:  Chief Complaint  Patient presents with   Manic Behavior    HPI Theresa [name] presents to the office today for follow-up of Bipolar Disorder, anxiety, and insomnia. She reports, "I've been doing better since my seizure."   She reports possible GI side effects with Metformin. She reports that diarrhea seems to have improved after recently starting to take Metformin before meals instead of after meals. She reports, "I started feeling better after a few days." She reports that her weight has stayed fairly stable since starting Metformin and would like to try to continue Metformin.  She reports that she was awake until after 3 am last night. She reports that she was "revved up" after talking with college friend. She reports that she slept ok the night before last. She reports that she has not had several consecutive days of poor sleep. Husband reports, "she has been manic for about a week." Husband reports that she seems to have cycles of GI issues. She reports that her appetite fluctuates, often in response to GI issues. Denies depressed mood. Denies risky or impulsive behavior. Denies excessive laughter. "I still feel weak from the seizure." She is doing some painting. She is working up to getting on the stationary bike. She reports difficulty with focus. Denies anhedonia. She has had some anxiety. She reports that TD is worse when vacuuming and doing physical activities. She reports that she sometimes will use Xanax prn to help when her body feels restless. Denies SI.   Husband reports that her memory fluctuates and pt reports, "my memory is bad." She reports that her memory has been worse since the seizure. She reports that going to bed early and sleeping 9-10 hours is helpful for recovery.   Friend from college is coming to visit in April.    Alprazolam last filled 08/17/22 x 2.  Alprazolam ER last 07/14/22 Klonopin last filled 08/07/22 x 1. Ambien last filled 08/04/22   Past Psychiatric Medication Trials: Olanzapine Seroquel Saphris Depakote-self-injurious behavior Lamictal Keppra Carbamazepine Xanax Klonopin Ambien Trazodone Benztropine Ingrezza Propranolol- Ineffective    AIMS    Flowsheet Row Office Visit from 05/20/2022 in [place] Visit from 10/13/2021 in [place] Visit from 08/04/2021 in [place] Visit from 03/09/2021 in [place] Visit from 09/30/2020 in [place] Total Score '8 6 9 10 9      '$ [place] Office Visit from 02/28/2017 in [place] at [employer] [facility]  Total Score (max 30 points ) 29      PHQ2-9    [place] from 05/27/2022 in [place] at [place] Visit from 05/17/2022 in [place] at [place] Visit from 02/28/2017 in [place] at [place] Visit from 09/19/2015 in [facility] at [place] from 06/05/2015 in [facility] at [facility] Total Score 0 1 0 0 0        Review of Systems:  Review of Systems  Gastrointestinal:  Positive for constipation and diarrhea. Negative for nausea and vomiting.  Musculoskeletal:  Negative for gait problem.  Neurological:  Positive for tremors. Negative for seizures.  Psychiatric/Behavioral:  Please refer to HPI    Medications: I have reviewed the patient's current medications.  Current Outpatient Medications  Medication Sig Dispense Refill   OLANZapine-Samidorphan (LYBALVI) 10-10 MG TABS Take 1 tablet by mouth at bedtime. 35 tablet 0   ALPRAZolam (XANAX XR) 2 MG 24 hr tablet TAKE 1 TABLET BY MOUTH IN THE  MORNING AND AT BEDTIME  180 tablet 1   ALPRAZolam (XANAX) 1 MG tablet TAKE 1 TABLET BY MOUTH DAILY AS  NEEDED FOR SEVERE ANXIETY 30 tablet 3   carbamazepine (CARBATROL) 300 MG 12 hr capsule Take 1 capsule (300 mg total) by mouth every morning AND 2 capsules (600 mg total) at bedtime. 270 capsule 1   cetirizine (ZYRTEC) 10 MG tablet Take 10 mg by mouth daily as needed for allergies or rhinitis.     chlorhexidine (PERIDEX) 0.12 % solution Use as directed 15 mLs in the mouth or throat 2 (two) times daily. 473 mL 0   clonazePAM (KLONOPIN) 1 MG tablet Take 1 tablet (1 mg total) by mouth at bedtime. 30 tablet 2   Deutetrabenazine (AUSTEDO) 12 MG TABS Take 1 tablet po BID with a 9 mg tablet to equal 21 mg po BID (Patient taking differently: Take 12 mg by mouth 2 (two) times daily.) 60 tablet 5   Deutetrabenazine (AUSTEDO) 9 MG TABS Take 1 tab po BID with a 12 mg tab to equal 21 mg BID (Patient taking differently: Take 9 mg by mouth 2 (two) times daily.) 60 tablet 5   docusate sodium (COLACE) 50 MG capsule Take 50 mg by mouth daily as needed for mild constipation.     fluticasone (FLONASE) 50 MCG/ACT nasal spray Place 1 spray into both nostrils daily as needed for allergies or rhinitis.     hydrocodone-ibuprofen (VICOPROFEN) 5-200 MG tablet Take 1 tablet by mouth every 8 (eight) hours as needed for pain. 20 tablet 0   levETIRAcetam (KEPPRA) 500 MG tablet TAKE 1 TABLET BY MOUTH TWICE  DAILY 200 tablet 0   lovastatin (MEVACOR) 20 MG tablet TAKE 1 TABLET BY MOUTH DAILY (Patient taking differently: Take 20 mg by mouth daily in the afternoon.) 90 tablet 3   Melatonin 10 MG TABS Take 10 mg by mouth at bedtime.     metFORMIN (GLUCOPHAGE) 500 MG tablet TAKE 1 TABLET BY MOUTH TWICE  DAILY WITH MEALS 200 tablet 2   Multiple Vitamins-Minerals (CENTRUM SILVER PO) Take 1 tablet by mouth 2 (two) times a week.     pantoprazole (PROTONIX) 40 MG tablet TAKE 1 TABLET BY MOUTH DAILY AS  NEEDED (Patient taking differently: Take 40 mg by mouth daily.)  90 tablet 3   polyethylene glycol (MIRALAX / GLYCOLAX) 17 g packet Take 17 g by mouth daily as needed for mild constipation.     tiZANidine (ZANAFLEX) 2 MG tablet Take 1 tablet (2 mg total) by mouth every 6 (six) hours as needed for muscle spasms. 30 tablet 1   traZODone (DESYREL) 100 MG tablet Take 1-2 tablets (100-200 mg total) by mouth at bedtime as needed. for sleep 180 tablet 3   zolpidem (AMBIEN) 10 MG tablet Take 1 tablet (10 mg total) by mouth at bedtime as needed. 30 tablet 5   No current facility-administered medications for this visit.    Medication Side Effects: Other: Possible diarrhea  Allergies:  Allergies  Allergen Reactions   Cyclobenzaprine Other (See Comments)    Blisters in mouth   Lamictal [Lamotrigine]     Acute renal failure  Ropinirole Nausea And Vomiting   Tramadol Other (See Comments)    Reaction??   Acetaminophen Itching, Swelling and Rash   Codeine Swelling, Rash and Other (See Comments)    Can take hydrocodone   Penicillins     Abdominal pain    Past Medical History:  Diagnosis Date   Adenomatous colon polyp    Allergy    Anxiety    Bipolar 1 disorder ([place])    Blood transfusion without reported diagnosis    Depression    Elevated LFTs    GERD (gastroesophageal reflux disease)    Hepatic steatosis 05/07/2013   Hyperlipidemia    Seizures ([place]) 1977   Last seizure 01/2020   Tardive dyskinesia     Past Medical History, Surgical history, Social history, and Family history were reviewed and updated as appropriate.   Please see review of systems for further details on the patient's review from today.   Objective:   Physical Exam:  Wt 181 lb (82.1 kg)   BMI 29.21 kg/m   Physical Exam Constitutional:      General: She is not in acute distress. Musculoskeletal:        General: No deformity.  Neurological:     Mental Status: She is alert and oriented to person, place, and time.     Coordination: Coordination normal.  Psychiatric:         Attention and Perception: Attention and perception normal. She does not perceive auditory or visual hallucinations.        Mood and Affect: Mood is not anxious. Affect is not labile, blunt, angry or inappropriate.        Speech: Speech normal.        Behavior: Behavior normal.        Thought Content: Thought content normal. Thought content is not paranoid or delusional. Thought content does not include homicidal or suicidal ideation. Thought content does not include homicidal or suicidal plan.        Cognition and Memory: Cognition normal.        Judgment: Judgment normal.     Comments: Insight intact Mood is slightly elevated Some mild ST memory impairment.     Lab Review:     Component Value Date/Time   NA 134 (L) 06/15/2022 0457   K 3.4 (L) 06/15/2022 0457   CL 102 06/15/2022 0457   CO2 21 (L) 06/15/2022 0457   GLUCOSE 118 (H) 06/15/2022 0457   BUN 10 06/15/2022 0457   CREATININE 0.77 06/15/2022 0457   CREATININE 0.79 05/08/2020 1015   CALCIUM 8.9 06/15/2022 0457   PROT 7.3 06/15/2022 0457   ALBUMIN 4.4 06/15/2022 0457   AST 113 (H) 06/15/2022 0457   ALT 38 06/15/2022 0457   ALKPHOS 81 06/15/2022 0457   BILITOT 0.9 06/15/2022 0457   GFRNONAA >60 06/15/2022 0457       Component Value Date/Time   WBC 14.7 (H) 06/15/2022 0457   RBC 4.91 06/15/2022 0457   HGB 14.3 06/15/2022 0457   HCT 41.4 06/15/2022 0457   PLT 247 06/15/2022 0457   MCV 84.3 06/15/2022 0457   MCV 87.7 02/07/2013 1110   MCH 29.1 06/15/2022 0457   MCHC 34.5 06/15/2022 0457   RDW 12.9 06/15/2022 0457   LYMPHSABS 1.1 06/14/2022 1229   MONOABS 1.2 (H) 06/14/2022 1229   EOSABS 0.0 06/14/2022 1229   BASOSABS 0.0 06/14/2022 1229    No results found for: "POCLITH", "LITHIUM"   Lab Results  Component Value Date   CBMZ  5.2 02/01/2019     .res Assessment: Plan:    Pt seen for 35 minutes and time spent discussing concern about weight gain with Olanzapine. Pt reports that GI side effects with  Metformin have improved since she recently started taking Metformin before meals as opposed to after meals. She reports that she would prefer to continue Metformin since her weight has stabilized and weight gain has slowed. Discussed potential benefits, risks, and side effects of Lybalvi and pt agrees to trial of Lybalvi. Discussed that Lybalvi is not available with Olanzapine 7.5 mg/ Samidorphan 10 mg. Pt agrees to try Lybalvi 10 mg-10 mg po QHS, particularly since higher dose may be helpful for current hypomanic s/s.  Will continue all other medications as prescribed.  Recommend continuing therapy with Rinaldo Cloud, LCSW.   Pt to follow-up with this provider in 3-4 weeks or sooner if clinically indicated.  Patient advised to contact office with any questions, adverse effects, or acute worsening in signs and symptoms.   Awilda was seen today for manic behavior.  Diagnoses and all orders for this visit:  Bipolar 1 disorder ([place]) -     OLANZapine-Samidorphan (LYBALVI) 10-10 MG TABS; Take 1 tablet by mouth at bedtime.  Posttraumatic stress disorder  Primary insomnia     Please see After Visit Summary for patient specific instructions.  Future Appointments  Date Time Provider [place]  09/23/2022 11:00 AM Shanon Ace, LCSW [facility] None  09/30/2022 10:00 AM Cameron Sprang, MD [facility] None  09/30/2022  1:00 PM [name], PMHNP [facility] None  10/21/2022 11:00 AM Shanon Ace, LCSW [facility] None  11/18/2022 11:00 AM Shanon Ace, LCSW [facility] None    No orders of the defined types were placed in this encounter.   -------------------------------

## 2022-09-02 ENCOUNTER — Telehealth: Payer: Self-pay | Admitting: Psychiatry

## 2022-09-02 NOTE — Telephone Encounter (Signed)
Pt took lybalvi last night and she slept very well,however she woke up feeling groggy,dizzy and "stoned". They would prefer her to take regular olanzapine

## 2022-09-02 NOTE — Telephone Encounter (Signed)
Pt's husband LVM @ 2:05p.  I don't see that he is on DPR.  He said pt was seen yesterday by [name].  She took new med for the first time last night, which helped her sleep, but she feels dizzy and groggy.  Is it just a matter of her needing to get use to it or should they do something else.  Next appt 2/8

## 2022-09-02 NOTE — Telephone Encounter (Signed)
Pt would like to stay on the 7.5 mg

## 2022-09-07 ENCOUNTER — Other Ambulatory Visit: Payer: Self-pay | Admitting: Psychiatry

## 2022-09-07 DIAGNOSIS — F319 Bipolar disorder, unspecified: Secondary | ICD-10-CM

## 2022-09-09 ENCOUNTER — Ambulatory Visit: Payer: Medicare Other | Admitting: Psychiatry

## 2022-09-16 ENCOUNTER — Telehealth: Payer: Self-pay | Admitting: Psychiatry

## 2022-09-16 ENCOUNTER — Other Ambulatory Visit: Payer: Self-pay

## 2022-09-16 DIAGNOSIS — F319 Bipolar disorder, unspecified: Secondary | ICD-10-CM

## 2022-09-16 MED ORDER — LYBALVI 10-10 MG PO TABS: 1.0000 | ORAL_TABLET | Freq: Every day | ORAL | 0 refills | Status: DC

## 2022-09-16 NOTE — Telephone Encounter (Signed)
Rx sent 

## 2022-09-16 NOTE — Telephone Encounter (Signed)
Fyi,ok to send?

## 2022-09-16 NOTE — Telephone Encounter (Signed)
Pt LVM @ 9:32a.  She said the 7.'5mg'$  Olanzapine was not working  she was not sleeping well, so last night she increase it to '10mg'$ .  She will need a refill sent to [facility] in a couple of day.  Next appt 2/8

## 2022-09-18 ENCOUNTER — Other Ambulatory Visit: Payer: Self-pay | Admitting: Psychiatry

## 2022-09-18 DIAGNOSIS — F319 Bipolar disorder, unspecified: Secondary | ICD-10-CM

## 2022-09-19 ENCOUNTER — Encounter: Payer: Self-pay | Admitting: Family Medicine

## 2022-09-20 ENCOUNTER — Other Ambulatory Visit: Payer: Self-pay

## 2022-09-20 ENCOUNTER — Telehealth: Payer: Self-pay | Admitting: Psychiatry

## 2022-09-20 MED ORDER — LOVASTATIN 20 MG PO TABS: 20.0000 mg | ORAL_TABLET | Freq: Every day | ORAL | 3 refills | Status: DC

## 2022-09-20 MED ORDER — OLANZAPINE 10 MG PO TABS: 10.0000 mg | ORAL_TABLET | Freq: Every day | ORAL | 1 refills | Status: DC

## 2022-09-20 NOTE — Telephone Encounter (Signed)
Rx sent 1/29

## 2022-09-20 NOTE — Telephone Encounter (Signed)
Pt LVM reporting she requested Olanzapine  10  mg Rx. The Lybalvi made her sick. Contact Pt @ [phone]

## 2022-09-22 ENCOUNTER — Other Ambulatory Visit: Payer: Self-pay | Admitting: Neurology

## 2022-09-22 DIAGNOSIS — G40309 Generalized idiopathic epilepsy and epileptic syndromes, not intractable, without status epilepticus: Secondary | ICD-10-CM

## 2022-09-23 ENCOUNTER — Ambulatory Visit: Payer: Medicare Other | Admitting: Psychiatry

## 2022-09-23 DIAGNOSIS — F319 Bipolar disorder, unspecified: Secondary | ICD-10-CM | POA: Diagnosis not present

## 2022-09-23 NOTE — Progress Notes (Signed)
[facility] Counselor/Therapist Progress Note  Patient ID: Theresa [name], MRN: [id number],    Date: 09/23/2022  Time Spent: 50 minutes   Treatment Type: Individual Therapy  Reported Symptoms: "doing pretty well but still a little manic", motivation improving "with my her new painting style" of cold wax oil painting  Mental Status Exam:  Appearance:   Casual     Behavior:  Appropriate, Sharing, and Motivated  Motor:  Normal  Speech/Language:   Clear and Coherent  Affect:  anxious  Mood:  anxious  Thought process:  goal directed  Thought content:    Rumination  Sensory/Perceptual disturbances:    WNL  Orientation:  oriented to person, place, time/date, situation, day of week, month of year, year, and stated date of Feb. 1, 2024  Attention:  Fair  Concentration:  Fair  Memory:  Some short term memory issues and med provider is aware  Fund of knowledge:   Fair  Insight:    Good  Judgment:   Good  Impulse Control:  Fair   Risk Assessment: Danger to Self:  No Self-injurious Behavior: No Danger to Others: No Duty to Warn:no Physical Aggression / Violence:No  Access to Firearms a concern: No  Gang Involvement:No   Subjective: Patient in today stating she is doing pretty well currently but still feels a little manic. States I'm more animated and especially with the dogs. "Not too manic" and I sometimes talk more. Husband will yell more when I'm anxious and he is some better and working on not yelling so much. Motivation improving with her new style of painting, cold waxed oil painting. "What helps when I'm manic is : watch TV, read, get busy doing something, and that's how I manage it best."  Stated her mood is good today also because she's happy  to be having a long-time friend come to visit her later today. Have seen patient previously when she was actually more manic and had more difficulty being still and staying in conversation. States she continues to do well on more  positives and negatives.  Is glad that she is liking her new style of painting, the cold wax painting, and looking forward to continuing it  Interventions: Cognitive Behavioral Therapy and Ego-Supportive  Long term goal: Reduce overall level, frequency, and intensity of the anxiety so that daily functioning is not impaired. Short-term goal: Increase understanding of the beliefs and messages that produce anxiety, depression, and worry. Strategies: Identify , challenge, and replace anxious/negative/depressive self-talk with positive, realistic, and empowering self talk.  Diagnosis:   ICD-10-CM   1. Bipolar 1 disorder ([place])  F31.9      Plan:  Patient in today showing good motivation and active participation in session, stating early in session that "I am still manic but so bad."  Described how she has been involved in some typical activities at home and with others including her husband and that she feels "he does not like me when I am manic because I tend to talk more and he is more of a quiet person."  States she finds other activities to do oftentimes and that works out for both of them.  As noted above she is very excited today that she has a longtime friend from out of town coming to visit and should be arriving about the time she gets home from her appointment here.  They have a lot of mutual interests and enjoy getting together.  Sounds like a very healthy relationship for  patient. Encouraged her to remain on meds as prescribed and if she has any problems between now and next appointment to definitely be in touch with our office and talk with her med provider. Encouraged patient to use positive behaviors as discussed in session including: believe more in her ability to make changes and maintain them, focus more on the things she can change versus cannot, stay in the present rather than the past or too far in the future, positive self talk, good boundaries with others, remain in touch with people  who are supportive, participate in more activities that bring her pleasure as she is able including with her pets and grandchildren and painting/hobbies at home,  spend some time outside each day, healthy nutrition and exercise, and recognize the strength she shows working with goal-directed behaviors to move in a direction that supports her improved emotional health and overall wellbeing.  Goal review and progress/challenges noted with patient.  Next appointment within 3 to 4 weeks.  This record has been created using [employer].  Chart creation errors have been sought, but may not always have been located and corrected.  Such creation errors do not reflect on the standard of medical care provided.   Shanon Ace, LCSW

## 2022-09-30 ENCOUNTER — Encounter: Payer: Self-pay | Admitting: Neurology

## 2022-09-30 ENCOUNTER — Ambulatory Visit: Payer: Medicare Other | Admitting: Psychiatry

## 2022-09-30 ENCOUNTER — Ambulatory Visit: Payer: Medicare Other | Admitting: Neurology

## 2022-09-30 ENCOUNTER — Encounter: Payer: Self-pay | Admitting: Psychiatry

## 2022-09-30 DIAGNOSIS — F5101 Primary insomnia: Secondary | ICD-10-CM | POA: Diagnosis not present

## 2022-09-30 DIAGNOSIS — F4310 Post-traumatic stress disorder, unspecified: Secondary | ICD-10-CM

## 2022-09-30 DIAGNOSIS — G40309 Generalized idiopathic epilepsy and epileptic syndromes, not intractable, without status epilepticus: Secondary | ICD-10-CM | POA: Diagnosis not present

## 2022-09-30 DIAGNOSIS — G2401 Drug induced subacute dyskinesia: Secondary | ICD-10-CM

## 2022-09-30 DIAGNOSIS — F319 Bipolar disorder, unspecified: Secondary | ICD-10-CM | POA: Diagnosis not present

## 2022-09-30 MED ORDER — CLONAZEPAM 1 MG PO TABS: 1.0000 mg | ORAL_TABLET | Freq: Every day | ORAL | 5 refills | Status: DC

## 2022-09-30 MED ORDER — AUSTEDO 9 MG PO TABS: ORAL_TABLET | ORAL | 5 refills | Status: DC

## 2022-09-30 MED ORDER — OLANZAPINE 10 MG PO TABS: 10.0000 mg | ORAL_TABLET | Freq: Every day | ORAL | 1 refills | Status: DC

## 2022-09-30 MED ORDER — AUSTEDO 12 MG PO TABS: ORAL_TABLET | ORAL | 5 refills | Status: DC

## 2022-09-30 MED ORDER — LEVETIRACETAM 500 MG PO TABS: 500.0000 mg | ORAL_TABLET | Freq: Two times a day (BID) | ORAL | 3 refills | Status: DC

## 2022-09-30 NOTE — Patient Instructions (Signed)
Always a pleasure to see you. Continue all your medications. If BP still high, make a BP log daily at home and discuss with Dr. Lorelei Pont. Follow-up in 1 year, call for any changes.   Seizure Precautions: 1. If medication has been prescribed for you to prevent seizures, take it exactly as directed.  Do not stop taking the medicine without talking to your doctor first, even if you have not had a seizure in a long time.   2. Avoid activities in which a seizure would cause danger to yourself or to others.  Don't operate dangerous machinery, swim alone, or climb in high or dangerous places, such as on ladders, roofs, or girders.  Do not drive unless your doctor says you may.  3. If you have any warning that you may have a seizure, lay down in a safe place where you can't hurt yourself.    4.  No driving for 6 months from last seizure, as per [facility].   Please refer to the following link on the [facility] website for more information: http://www.epilepsyfoundation.org/answerplace/Social/driving/drivingu.cfm   5.  Maintain good sleep hygiene. Avoid alcohol.  6.  Contact your doctor if you have any problems that may be related to the medicine you are taking.  7.  Call 911 and bring the patient back to the ED if:        A.  The seizure lasts longer than 5 minutes.       B.  The patient doesn't awaken shortly after the seizure  C.  The patient has new problems such as difficulty seeing, speaking or moving  D.  The patient was injured during the seizure  E.  The patient has a temperature over 102 F (39C)  F.  The patient vomited and now is having trouble breathing

## 2022-09-30 NOTE — Progress Notes (Signed)
BECCI HRISTOV [id number] 11-Apr-1956 67 y.o.  Subjective:   Patient ID:  Theresa [name] is a 67 y.o. (DOB May 31, 1956) female.  Chief Complaint:  Chief Complaint  Patient presents with   Follow-up    Bipolar Disorder, anxiety, insomnia.     HPI Sharonne ALMAROSA CAHOON presents to the office today for follow-up of Bipolar Disorder, Anxiety, and Insomnia.   She reports that she felt "really sick...dizzy, nauseous" after taking Lybalvi. She reports that this resolved after one day.   She reports that she is taking Olanzapine 10 mg po QHS. She reports that she has several nights of adequate sleep and then a couple of nights with poor sleep. She typically falls asleep for about 20 minutes after taking her morning medications. She tried to reduce Melatonin to 10 mg po QHS and noticed she did not sleep as well and did not feel less tired the next day. She reports that she sleeps about 4 hours on nights of poor sleep. She has been going to bed around 8 pm, which is earlier than her usual bedtime. She reports going to bed as early as 7 pm sometimes.   Husband notices she is occ talking about several different topics that are unrelated without transitions. He reports that she will also send emails during the night when she has some hypomanic symptoms. She will periodically send and read emails around 3-4 am. She emails a friend daily and friend will notice changes in mood based on the length and context of emails (ie., switching topics frequently) He reports that she is not excessively talkative. Energy has been low and she describes motivation as "medium." Appetite has been ok. Concentration difficulties, especially with art. She reports that she is not having to read the same thing several times. Reading a chapter at a time. Now reading on a Kindle. Denies SI.   She denies any significant change in sleep or mood between Olanzapine 7.5 mg or 10 mg QHS.   Husband reports that he notices her memory is impaired  for months after seizure activity. She reports, "when I have a seizure, I get depressed." She reports that she has some depression initially after her seizure and denies current depression.   She reports that her anxiety is "the same." She reports that she took Xanax prn prior to artwork since she has some anxiety and frustration due to TD interfering with her artwork. She reports taking Xanax IR before social events. Taking Xanax IR prn about 3 times a week on average.   She reports TD is "the same." She reports that she is trying to be more physically active.   Past Psychiatric Medication Trials: Olanzapine Seroquel Saphris Depakote-self-injurious behavior Lamictal Keppra Carbamazepine Xanax Klonopin Ambien Trazodone Benztropine Ingrezza Propranolol- Ineffective  AIMS    Flowsheet Row Office Visit from 09/30/2022 in [facility] Office Visit from 05/20/2022 in [place] Office Visit from 10/13/2021 in [facility] Office Visit from 08/04/2021 in [place] Office Visit from 03/09/2021 in [place] Total Score 13 8 6 9 10      $ [facility] Office Visit from 02/28/2017 in [facility]  Total Score (max 30 points ) 29      [id number]    [place] [facility]  Visit from 05/17/2022 in [facility] [place] Visit from 02/28/2017 in [facility] at [place] Visit from 09/19/2015 in Primary Care at [place] from 06/05/2015 in Primary Care at [facility] Total Score 0 1 0 0 0        Review of Systems:  Review of Systems  Musculoskeletal:  Negative for gait problem.  Neurological:  Positive for  tremors.  Psychiatric/Behavioral:         Please refer to HPI    Medications: I have reviewed the patient's current medications.  Current Outpatient Medications  Medication Sig Dispense Refill   ALPRAZolam (XANAX) 1 MG tablet TAKE 1 TABLET BY MOUTH DAILY AS  NEEDED FOR SEVERE ANXIETY 30 tablet 3   Melatonin 10 MG TABS Take 20 mg by mouth at bedtime.     ALPRAZolam (XANAX XR) 2 MG 24 hr tablet TAKE 1 TABLET BY MOUTH IN THE  MORNING AND AT BEDTIME 180 tablet 1   carbamazepine (CARBATROL) 300 MG 12 hr capsule Take 1 capsule (300 mg total) by mouth every morning AND 2 capsules (600 mg total) at bedtime. 270 capsule 1   cetirizine (ZYRTEC) 10 MG tablet Take 10 mg by mouth daily as needed for allergies or rhinitis.     [START ON 10/30/2022] clonazePAM (KLONOPIN) 1 MG tablet Take 1 tablet (1 mg total) by mouth at bedtime. 30 tablet 5   Deutetrabenazine (AUSTEDO) 12 MG TABS Take 1 tablet po BID with a 9 mg tablet to equal 21 mg po BID 60 tablet 5   Deutetrabenazine (AUSTEDO) 9 MG TABS Take 1 tab po BID with a 12 mg tab to equal 21 mg BID 60 tablet 5   docusate sodium (COLACE) 50 MG capsule Take 50 mg by mouth daily as needed for mild constipation.     fluticasone (FLONASE) 50 MCG/ACT nasal spray Place 1 spray into both nostrils daily as needed for allergies or rhinitis.     levETIRAcetam (KEPPRA) 500 MG tablet Take 1 tablet (500 mg total) by mouth 2 (two) times daily. 200 tablet 3   lovastatin (MEVACOR) 20 MG tablet Take 1 tablet (20 mg total) by mouth daily. 90 tablet 3   Multiple Vitamins-Minerals (CENTRUM SILVER PO) Take 1 tablet by mouth 2 (two) times a week.     OLANZapine (ZYPREXA) 10 MG tablet Take 1 tablet (10 mg total) by mouth at bedtime. 90 tablet 1   pantoprazole (PROTONIX) 40 MG tablet TAKE 1 TABLET BY MOUTH DAILY AS  NEEDED (Patient taking differently: Take 40 mg by mouth daily.) 90 tablet 3   polyethylene glycol (MIRALAX / GLYCOLAX) 17 g packet Take 17 g by mouth daily as needed for mild  constipation.     tiZANidine (ZANAFLEX) 2 MG tablet Take 1 tablet (2 mg total) by mouth every 6 (six) hours as needed for muscle spasms. 30 tablet 1   traZODone (DESYREL) 100 MG tablet Take 1-2 tablets (100-200 mg total) by mouth at bedtime as needed. for sleep 180 tablet 3   zolpidem (AMBIEN) 10 MG tablet Take 1 tablet (10 mg total) by mouth at bedtime as needed. 30 tablet 5   No current facility-administered medications for this visit.    Medication Side Effects: Other: Tremor, involuntary movement  Allergies:  Allergies  Allergen Reactions   Cyclobenzaprine Other (See Comments)    Blisters in mouth   Lamictal [Lamotrigine]     Acute renal  failure   Lybalvi [Olanzapine-Samidorphan] Other (See Comments)    Nausea, dizziness   Ropinirole Nausea And Vomiting   Tramadol Other (See Comments)    Reaction??   Acetaminophen Itching, Swelling and Rash   Codeine Swelling, Rash and Other (See Comments)    Can take hydrocodone   Penicillins     Abdominal pain    Past Medical History:  Diagnosis Date   Adenomatous colon polyp    Allergy    Anxiety    Bipolar 1 disorder ([place])    Blood transfusion without reported diagnosis    Depression    Elevated LFTs    GERD (gastroesophageal reflux disease)    Hepatic steatosis 05/07/2013   Hyperlipidemia    Seizures ([place]) 1977   Last seizure 01/2020   Tardive dyskinesia     Past Medical History, Surgical history, Social history, and Family history were reviewed and updated as appropriate.   Please see review of systems for further details on the patient's review from today.   Objective:   Physical Exam:  There were no vitals taken for this visit.  Physical Exam Constitutional:      General: She is not in acute distress. Musculoskeletal:        General: No deformity.  Neurological:     Mental Status: She is alert and oriented to person, place, and time.     Coordination: Coordination normal.  Psychiatric:        Attention and  Perception: Attention and perception normal. She does not perceive auditory or visual hallucinations.        Mood and Affect: Mood normal. Mood is not anxious or depressed. Affect is not labile, blunt, angry or inappropriate.        Speech: Speech normal.        Behavior: Behavior normal.        Thought Content: Thought content normal. Thought content is not paranoid or delusional. Thought content does not include homicidal or suicidal ideation. Thought content does not include homicidal or suicidal plan.        Cognition and Memory: Cognition and memory normal.        Judgment: Judgment normal.     Comments: Insight intact     Lab Review:     Component Value Date/Time   NA 134 (L) 06/15/2022 0457   K 3.4 (L) 06/15/2022 0457   CL 102 06/15/2022 0457   CO2 21 (L) 06/15/2022 0457   GLUCOSE 118 (H) 06/15/2022 0457   BUN 10 06/15/2022 0457   CREATININE 0.77 06/15/2022 0457   CREATININE 0.79 05/08/2020 1015   CALCIUM 8.9 06/15/2022 0457   PROT 7.3 06/15/2022 0457   ALBUMIN 4.4 06/15/2022 0457   AST 113 (H) 06/15/2022 0457   ALT 38 06/15/2022 0457   ALKPHOS 81 06/15/2022 0457   BILITOT 0.9 06/15/2022 0457   GFRNONAA >60 06/15/2022 0457       Component Value Date/Time   WBC 14.7 (H) 06/15/2022 0457   RBC 4.91 06/15/2022 0457   HGB 14.3 06/15/2022 0457   HCT 41.4 06/15/2022 0457   PLT 247 06/15/2022 0457   MCV 84.3 06/15/2022 0457   MCV 87.7 02/07/2013 1110   MCH 29.1 06/15/2022 0457   MCHC 34.5 06/15/2022 0457   RDW 12.9 06/15/2022 0457   LYMPHSABS 1.1 06/14/2022 1229   MONOABS 1.2 (H) 06/14/2022 1229   EOSABS 0.0 06/14/2022 1229   BASOSABS 0.0 06/14/2022 1229    No results found for: "POCLITH", "LITHIUM"  Lab Results  Component Value Date   CBMZ 5.2 02/01/2019     .res Assessment: Plan:   Pt seen for 30 minutes and time spent reviewing recent changes in BP and discussing metformin. BP was elevated at apt today and during recent apt with Dr. Delice Lesch. Pt reports  that BP tends to read higher at medical apts compared to BP readings at home.  Discussed continuing to monitor BP and to follow-up with PCP regarding BP. Discussed that some medications that lower BP can occasionally improve tremor as well.  Will discontinue Metformin since it is unclear if there has been any significant benefit in terms of preventing antipsychotic induced weight gain. Discussed that her weight has remained in the same range while taking Metformin without any significant weight loss. Will continue to monitor weight and will consider re-starting Metformin if weight begins to increase.  Will continue all other medications as prescribed.  Recommend continuing therapy with Rinaldo Cloud, LCSW.  Pt to follow-up in 2 months or sooner if clinically indicated.  Patient advised to contact office with any questions, adverse effects, or acute worsening in signs and symptoms.   Kathy was seen today for follow-up.  Diagnoses and all orders for this visit:  Bipolar affective disorder, remission status unspecified ([place])  Posttraumatic stress disorder -     clonazePAM (KLONOPIN) 1 MG tablet; Take 1 tablet (1 mg total) by mouth at bedtime. -     OLANZapine (ZYPREXA) 10 MG tablet; Take 1 tablet (10 mg total) by mouth at bedtime.  Primary insomnia -     clonazePAM (KLONOPIN) 1 MG tablet; Take 1 tablet (1 mg total) by mouth at bedtime. -     OLANZapine (ZYPREXA) 10 MG tablet; Take 1 tablet (10 mg total) by mouth at bedtime.  Tardive dyskinesia -     Deutetrabenazine (AUSTEDO) 9 MG TABS; Take 1 tab po BID with a 12 mg tab to equal 21 mg BID -     Deutetrabenazine (AUSTEDO) 12 MG TABS; Take 1 tablet po BID with a 9 mg tablet to equal 21 mg po BID     Please see After Visit Summary for patient specific instructions.  Future Appointments  Date Time Provider [place]  10/18/2022 12:00 PM Shanon Ace, LCSW [facility] None  11/15/2022 11:00 AM Shanon Ace, LCSW [facility] None  11/29/2022   1:30 PM [name], PMHNP [facility] None  12/13/2022 11:00 AM Shanon Ace, LCSW [facility] None  09/26/2023 10:00 AM Cameron Sprang, MD [facility] None    No orders of the defined types were placed in this encounter.   -------------------------------
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
## 2022-10-05 ENCOUNTER — Telehealth: Payer: Self-pay | Admitting: Psychiatry

## 2022-10-05 NOTE — Telephone Encounter (Signed)
Pt lvm that her insurance will cover 100 pills on any 90 pills. Also the insurance will cover  an additional 30 pills for any controlled  substances

## 2022-10-05 NOTE — Telephone Encounter (Signed)
Noted  

## 2022-10-18 ENCOUNTER — Ambulatory Visit: Payer: Medicare Other | Admitting: Psychiatry

## 2022-10-18 DIAGNOSIS — F319 Bipolar disorder, unspecified: Secondary | ICD-10-CM | POA: Diagnosis not present

## 2022-10-18 NOTE — Progress Notes (Signed)
[facility] Counselor/Therapist Progress Note  Patient ID: Theresa [name], MRN: [id number],    Date: 10/18/2022  Time Spent: 48 minutes   Treatment Type: Individual Therapy  Reported Symptoms: "some depression, some mania"   Mental Status Exam:  Appearance:   Casual     Behavior:  Appropriate, Sharing, and Motivated  Motor:  Normal  Speech/Language:   Clear and Coherent  Affect:  Some depression, but also reporting some mania   Mood:  depressed and "in middle of depression and mania"  Thought process:  goal directed  Thought content:    WNL  Sensory/Perceptual disturbances:    WNL  Orientation:  oriented to person, place, time/date, situation, day of week, month of year, year, and stated date of Feb. 26, 2024  Attention:  Good  Concentration:  Good  Memory:  "Mild Cognitive issues per testing last yr"  Fund of knowledge:   Fair  Insight:    Good and Fair  Judgment:   Good  Impulse Control:  Good   Risk Assessment: Danger to Self:  No Self-injurious Behavior: No Danger to Others: No Duty to Warn:no Physical Aggression / Violence:No  Access to Firearms a concern: No  Gang Involvement:No   Subjective:  Patient in today reporting "some shifting in moods but my meds help keep me more balanced and not going to extremes." Some memory issues after testing last yr. Executive functioning also a problem at times. Feels she's been doing better recently, some anxiety earlier but not really now. Some depression but more manageable. 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 Motivation good. Knows what helps her when manic and  uses those skills/strategies to help herself. Still misses her former method of painting but it became difficult due to her hand shaking, and the cold wax painting  is a different method that patient has begun to work with. Continues using her stationary bike, enjoys her reading, and outdoors walking with her dogs. Staying connected to a few close friends and able to see the positive from that.   Interventions: Cognitive Behavioral Therapy and Ego-Supportive  Long term goal: Reduce overall level, frequency, and intensity of the anxiety so that daily functioning is not impaired. Short-term goal: Increase understanding of the beliefs and messages that produce anxiety, depression, and worry. Strategies: Identify , challenge, and replace anxious/negative/depressive self-talk with positive, realistic, and empowering self talk.  Diagnosis:   ICD-10-CM   1. Bipolar 1 disorder ([place])  F31.9      Plan:  Patient today participating well in session and is noticing definite progress.  Needs to continue with goal-directed behaviors to both maintain her progress and keep moving forward as she remains on her medication and also participates in activities that she enjoys and also brings her together with other people for socialization.  Saw a longtime friend recently and that was special for patient as she works to stay in touch with a few different people that she has good connections with. Encouraged patient in using positive and self affirming behaviors as discussed in session including: Believing more in her ability to make positive changes and maintain them, focusing more on the things she can change versus cannot, remain in the present rather than the past or too far into the future, use of  positive self talk, good boundaries with others, remain in touch with people who are supportive, participate in more activities that bring her pleasure including time spent with pets and grandchildren and hobbies such as painting, spending some time outside each day, healthy nutrition and exercise, and realize the strength she shows when working with goal-directed behaviors to move in a  direction that supports her improved emotional health and outlook.  Goal review and progress/challenges noted with patient.  Next appointment within 3 to 4 weeks.  This record has been created using [employer].  Chart creation errors have been sought, but may not always have been located and corrected.  Such creation errors do not reflect on the standard of medical care provided.   Shanon Ace, LCSW

## 2022-10-21 ENCOUNTER — Ambulatory Visit: Payer: Medicare Other | Admitting: Psychiatry

## 2022-10-23 ENCOUNTER — Other Ambulatory Visit: Payer: Self-pay | Admitting: Family Medicine

## 2022-10-23 DIAGNOSIS — K219 Gastro-esophageal reflux disease without esophagitis: Secondary | ICD-10-CM

## 2022-11-02 DIAGNOSIS — H02834 Dermatochalasis of left upper eyelid: Secondary | ICD-10-CM | POA: Diagnosis not present

## 2022-11-02 DIAGNOSIS — H02831 Dermatochalasis of right upper eyelid: Secondary | ICD-10-CM | POA: Diagnosis not present

## 2022-11-02 DIAGNOSIS — H25813 Combined forms of age-related cataract, bilateral: Secondary | ICD-10-CM | POA: Diagnosis not present

## 2022-11-02 DIAGNOSIS — H31093 Other chorioretinal scars, bilateral: Secondary | ICD-10-CM | POA: Diagnosis not present

## 2022-11-02 DIAGNOSIS — H40033 Anatomical narrow angle, bilateral: Secondary | ICD-10-CM | POA: Diagnosis not present

## 2022-11-15 ENCOUNTER — Other Ambulatory Visit: Payer: Self-pay | Admitting: Psychiatry

## 2022-11-15 ENCOUNTER — Ambulatory Visit: Payer: Medicare Other | Admitting: Psychiatry

## 2022-11-15 DIAGNOSIS — F319 Bipolar disorder, unspecified: Secondary | ICD-10-CM | POA: Diagnosis not present

## 2022-11-15 DIAGNOSIS — F4310 Post-traumatic stress disorder, unspecified: Secondary | ICD-10-CM

## 2022-11-15 NOTE — Progress Notes (Signed)
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  Fund of knowledge:   Good  Insight:    Good/Fair  Judgment:   Good  Impulse Control:  Good   Risk Assessment: Danger to Self:  No Self-injurious Behavior: No Danger to Others: No Duty to Warn:no Physical Aggression / Violence:No  Access to Firearms a concern: No  Gang Involvement:No   Subjective:   Patient in today reporting anxiety and some mania due to "me having friends come in April that I knew in college". Executive function continues to be an issue and Dr says it may not get better. "Not really going to extremes which is good." Anxiety currently higher. Depression "right now in the moment is about a 1, but over all since last visit is a "3" and anxiety is a 5. Her motivation is good. Explains how she is better at setting limits for herself "when manic such as not going online and blowing money." Hasn't been doing the cold wax painting past few weeks. States sometimes I use other activities that helps, including walking outside, and stationary bike. Got much shorter haircut which  she is enjoying.  Feels her motivation is good.  Continues to work on managing anxiety and especially the beliefs that she feels like contributes to her anxiety as noted in her goal-directed behaviors, which we discussed more in session today.  States that she feels sure that a lot of her current anxiety is related to the anticipation of her friends arriving in April not seen them in a while and is looking forward to this time being together "although it might be a little crowded at her house".  Continues to stay connected with a few friends and enjoys the time spent with her dogs especially taking walks.  Interventions: Cognitive Behavioral Therapy and Ego-Supportive  Long term goal: Reduce overall level, frequency, and intensity of the anxiety so that daily functioning is not impaired. Short-term goal: Increase understanding of the beliefs and messages that produce anxiety, depression, and worry. Strategies: Identify , challenge, and replace anxious/negative/depressive self-talk with positive, realistic, and empowering self talk.  Diagnosis:   ICD-10-CM   1. Bipolar 1 disorder ([place])  F31.9      Plan:   Patient in session today actively participating and working on her anxiety that she is experiencing some increase in currently which she reports is related to her having some friends that she knew in college come visit in April and she is already looking forward to it.  Does note that in special occasions like  this, her history has been that it does influence her level of anxiety and some mania.  To continue working with goal-directed behaviors that support her moving in a positive direction. Encouraged patient and her use of positive and self affirming behaviors as discussed in session including: Believing more in her ability to make positive changes and maintain them, focusing more on the things she can change versus cannot, stay in touch with people who are supportive, participate in more  activities that bring her pleasure including time spent with pets/grandchildren/hobbies, use of positive self talk, good boundaries with others, spending some time outside each day, healthy nutrition and exercise, and recognize the strengths she shows when working with goal-directed behaviors to move in a direction that supports her improved emotional health and overall wellbeing.  Self rating scales: 1-10 depression scale- 3 1-10 anxiety scale- 5  Goal review and progress/challenges noted with patient.  Next appointment within 3 to 4 weeks.  This record has been created using [employer].  Chart creation errors have been sought, but may not always have been located and corrected.  Such creation errors do not reflect on the standard of medical care provided.   Shanon Ace, LCSW

## 2022-11-18 ENCOUNTER — Ambulatory Visit: Payer: Medicare Other | Admitting: Psychiatry

## 2022-11-29 ENCOUNTER — Encounter: Payer: Self-pay | Admitting: Psychiatry

## 2022-11-29 ENCOUNTER — Ambulatory Visit: Payer: Medicare Other | Admitting: Psychiatry

## 2022-11-29 VITALS — BP 142/105 | HR 77 | Wt 180.0 lb

## 2022-11-29 DIAGNOSIS — F4310 Post-traumatic stress disorder, unspecified: Secondary | ICD-10-CM

## 2022-11-29 DIAGNOSIS — G40309 Generalized idiopathic epilepsy and epileptic syndromes, not intractable, without status epilepticus: Secondary | ICD-10-CM | POA: Diagnosis not present

## 2022-11-29 DIAGNOSIS — T50905A Adverse effect of unspecified drugs, medicaments and biological substances, initial encounter: Secondary | ICD-10-CM

## 2022-11-29 DIAGNOSIS — F319 Bipolar disorder, unspecified: Secondary | ICD-10-CM | POA: Diagnosis not present

## 2022-11-29 DIAGNOSIS — R635 Abnormal weight gain: Secondary | ICD-10-CM

## 2022-11-29 DIAGNOSIS — F5101 Primary insomnia: Secondary | ICD-10-CM

## 2022-11-29 MED ORDER — METFORMIN HCL 500 MG PO TABS: 500.0000 mg | ORAL_TABLET | Freq: Two times a day (BID) | ORAL | 2 refills | Status: DC

## 2022-11-29 MED ORDER — CARBAMAZEPINE ER 300 MG PO CP12: ORAL_CAPSULE | ORAL | 1 refills | Status: DC

## 2022-11-29 MED ORDER — ZOLPIDEM TARTRATE 10 MG PO TABS
10.0000 mg | ORAL_TABLET | Freq: Every evening | ORAL | 5 refills | Status: DC | PRN
Start: 2023-01-10 — End: 2023-06-06

## 2022-11-29 MED ORDER — ALPRAZOLAM ER 2 MG PO TB24: ORAL_TABLET | ORAL | 1 refills | Status: DC

## 2022-11-29 NOTE — Progress Notes (Signed)
Theresa [name] [id number] 1955-09-23 67 y.o.  Subjective:   Patient ID:  Theresa [name] is a 67 y.o. (DOB [birth year]) female.  Chief Complaint:  Chief Complaint  Patient presents with   Follow-up    Bipolar disorder, PTSD, and insomnia    HPI Carmell H Pelto presents to the office today for follow-up of Bipolar Disorder, anxiety, and insomnia. She reports that her mood has been "a little up and down, and mostly in the stable range." She reports that her anxiety is consistent with baseline and is "up and down." She reports occ anxiety and frustration when TD has interfered with painting. She reports that her concentration is fair. "Motivation has not been that great" and has not been as interested in some things, possibly because of TD and diminished vision. Denies SI.   She reports that she is sleeping well overall. She reports that she started gaining weight when she stopped Metformin and re-started it about 4 days later. She re-started Metformin and her weight has been stable. She reports that her appetite is low and denied any significant increase in food intake.   Husband reports that her BP was high when she went to the doctor and then BP has been normal since then.   She reports occ difficulty with TD. Reports that TD was more "pronounced" one morning and it improved about 30 minutes after taking [place].   Expecting guests in  about a week. Having other friends come to visit several weeks later. Having cataract surgery in a few weeks and will have cataract surgery on the other eye 2 weeks later. She is hopeful that her vision improves and she can start reading again.   She has 2 new puppies.   Xanax last filled 11/18/22. Xanax XR last filled 10/25/22.  Ambien last filled 11/13/22 x 4.  Klonopin last filled 11/06/22 x 3.  Past Psychiatric Medication Trials: Olanzapine Seroquel Saphris Depakote-self-injurious  behavior Lamictal Keppra Carbamazepine Xanax Klonopin Ambien Trazodone Benztropine Ingrezza Propranolol- Ineffective  AIMS    Flowsheet Row Office Visit from 09/30/2022 in [facility] Office Visit from 05/20/2022 in [facility] Office Visit from 10/13/2021 in [facility] Office Visit from 08/04/2021 in [facility] Office Visit from 03/09/2021 in [facility]  AIMS Total Score 13 8 6 9 10       Mini-Mental    Flowsheet Row Office Visit from 02/28/2017 in [facility]  Total Score (max 30 points ) 29      PHQ2-9    Flowsheet Row Clinical Support from 05/27/2022 in [facility] at [facility] Office Visit from 05/17/2022 in [facility] at [facility] Office Visit from 02/28/2017 in [facility] at [facility] Office Visit from 09/19/2015 in Primary Care at [facility] Visit from 06/05/2015 in Primary Care at [facility] Total Score 0 1 0 0 0        Review of Systems:  Review of Systems  Eyes:  Positive for itching.       Upcoming cataract surgery  Musculoskeletal:  Negative for gait problem.  Allergic/Immunologic: Positive for environmental allergies.  Neurological:  Positive for tremors.  Psychiatric/Behavioral:         Please refer to HPI    Medications: I have reviewed the patient's current medications.  Current Outpatient Medications  Medication Sig Dispense Refill   [START ON 01/17/2023] ALPRAZolam (XANAX XR) 2 MG 24 hr tablet TAKE 1 TABLET BY MOUTH IN THE  MORNING AND AT BEDTIME 180 tablet 1   ALPRAZolam (XANAX) 1 MG tablet TAKE 1 TABLET BY MOUTH DAILY AS NEEDED FOR SEVERE ANXIETY 30 tablet 3   carbamazepine (CARBATROL) 300 MG 12 hr capsule Take 1 capsule (300 mg total) by mouth every morning AND 2  capsules (600 mg total) at bedtime. 270 capsule 1   cetirizine (ZYRTEC) 10 MG tablet Take 10 mg by mouth daily as needed for allergies or rhinitis.     clonazePAM (KLONOPIN) 1 MG tablet Take 1 tablet (1 mg total) by mouth at bedtime. 30 tablet 5   Deutetrabenazine (AUSTEDO) 12 MG TABS Take 1 tablet po BID with a 9 mg tablet to equal 21 mg po BID 60 tablet 5   Deutetrabenazine (AUSTEDO) 9 MG TABS Take 1 tab po BID with a 12 mg tab to equal 21 mg BID 60 tablet 5   docusate sodium (COLACE) 50 MG capsule Take 50 mg by mouth daily as needed for mild constipation.     fluticasone (FLONASE) 50 MCG/ACT nasal spray Place 1 spray into both nostrils daily as needed for allergies or rhinitis.     levETIRAcetam (KEPPRA) 500 MG tablet Take 1 tablet (500 mg total) by mouth 2 (two) times daily. 200 tablet 3   lovastatin (MEVACOR) 20 MG tablet Take 1 tablet (20 mg total) by mouth daily. 90 tablet 3   Melatonin 10 MG TABS Take 20 mg by mouth at bedtime.     metFORMIN (GLUCOPHAGE) 500 MG tablet Take 1 tablet (500 mg total) by mouth 2 (two) times daily with a meal. 180 tablet 2   Multiple Vitamins-Minerals (CENTRUM SILVER PO) Take 1 tablet by mouth 2 (two) times a week.     OLANZapine (ZYPREXA) 10 MG tablet Take 1 tablet (10 mg total) by mouth at bedtime. 90 tablet 1   pantoprazole (PROTONIX) 40 MG tablet TAKE 1 TABLET BY MOUTH DAILY AS  NEEDED 100 tablet 2   polyethylene glycol (MIRALAX / GLYCOLAX) 17 g packet Take 17 g by mouth daily as needed for mild constipation.     tiZANidine (ZANAFLEX) 2 MG tablet Take 1 tablet (2 mg total) by mouth every 6 (six) hours as needed for muscle spasms. 30 tablet 1   traZODone (DESYREL) 100 MG tablet Take 1-2 tablets (100-200 mg total) by mouth at bedtime as needed. for sleep 180 tablet 3   [START ON 01/10/2023] zolpidem (AMBIEN) 10 MG tablet Take 1 tablet (10 mg total) by mouth at bedtime as needed. 30 tablet 5   No current facility-administered medications for this visit.     Medication Side Effects: Other: Tremor/TD  Allergies:  Allergies  Allergen Reactions   Cyclobenzaprine Other (See Comments)    Blisters in mouth   Lamictal [Lamotrigine]     Acute renal failure   Lybalvi [Olanzapine-Samidorphan] Other (See Comments)    Nausea, dizziness   Ropinirole Nausea And Vomiting   Tramadol Other (See Comments)    Reaction??   Acetaminophen Itching, Swelling and Rash   Codeine Swelling, Rash and Other (See Comments)    Can take hydrocodone   Penicillins     Abdominal pain    Past Medical History:  Diagnosis Date   Adenomatous colon polyp    Allergy    Anxiety    Bipolar 1 disorder    Blood transfusion without reported  diagnosis    Depression    Elevated LFTs    GERD (gastroesophageal reflux disease)    Hepatic steatosis 05/07/2013   Hyperlipidemia    Seizures 1977   Last seizure 01/2020   Tardive dyskinesia     Past Medical History, Surgical history, Social history, and Family history were reviewed and updated as appropriate.   Please see review of systems for further details on the patient's review from today.   Objective:   Physical Exam:  BP (!) 142/105   Pulse 77   Wt 180 lb (81.6 kg)   BMI 29.05 kg/m   Physical Exam Constitutional:      General: She is not in acute distress. Musculoskeletal:        General: No deformity.  Neurological:     Mental Status: She is alert and oriented to person, place, and time.     Coordination: Coordination normal.  Psychiatric:        Attention and Perception: Attention and perception normal. She does not perceive auditory or visual hallucinations.        Mood and Affect: Mood normal. Mood is not anxious or depressed. Affect is not labile, blunt, angry or inappropriate.        Speech: Speech normal.        Behavior: Behavior normal.        Thought Content: Thought content normal. Thought content is not paranoid or delusional. Thought content does not include homicidal or suicidal ideation.  Thought content does not include homicidal or suicidal plan.        Cognition and Memory: Cognition and memory normal.        Judgment: Judgment normal.     Comments: Insight intact     Lab Review:     Component Value Date/Time   NA 134 (L) 06/15/2022 0457   K 3.4 (L) 06/15/2022 0457   CL 102 06/15/2022 0457   CO2 21 (L) 06/15/2022 0457   GLUCOSE 118 (H) 06/15/2022 0457   BUN 10 06/15/2022 0457   CREATININE 0.77 06/15/2022 0457   CREATININE 0.79 05/08/2020 1015   CALCIUM 8.9 06/15/2022 0457   PROT 7.3 06/15/2022 0457   ALBUMIN 4.4 06/15/2022 0457   AST 113 (H) 06/15/2022 0457   ALT 38 06/15/2022 0457   ALKPHOS 81 06/15/2022 0457   BILITOT 0.9 06/15/2022 0457   GFRNONAA >60 06/15/2022 0457       Component Value Date/Time   WBC 14.7 (H) 06/15/2022 0457   RBC 4.91 06/15/2022 0457   HGB 14.3 06/15/2022 0457   HCT 41.4 06/15/2022 0457   PLT 247 06/15/2022 0457   MCV 84.3 06/15/2022 0457   MCV 87.7 02/07/2013 1110   MCH 29.1 06/15/2022 0457   MCHC 34.5 06/15/2022 0457   RDW 12.9 06/15/2022 0457   LYMPHSABS 1.1 06/14/2022 1229   MONOABS 1.2 (H) 06/14/2022 1229   EOSABS 0.0 06/14/2022 1229   BASOSABS 0.0 06/14/2022 1229    No results found for: "POCLITH", "LITHIUM"   Lab Results  Component Value Date   CBMZ 5.2 02/01/2019     .res Assessment: Plan:    Will re-start Metformin for antipsychotic induced weight gain since she reports immediately gaining weight after stopping Metformin for several days. She reports that she re-started Metformin and weight stabilized.  Will continue current plan of care since target signs and symptoms are well controlled without any tolerability issues. Pt to follow-up in 2 months or sooner if clinically indicated.  Recommend continuing therapy with  Rockne Menghiniebbie Dowd, LCSW.  Patient advised to contact office with any questions, adverse effects, or acute worsening in signs and symptoms.   Shanayah was seen today for follow-up.  Diagnoses  and all orders for this visit:  Bipolar 1 disorder -     carbamazepine (CARBATROL) 300 MG 12 hr capsule; Take 1 capsule (300 mg total) by mouth every morning AND 2 capsules (600 mg total) at bedtime.  Posttraumatic stress disorder -     ALPRAZolam (XANAX XR) 2 MG 24 hr tablet; TAKE 1 TABLET BY MOUTH IN THE  MORNING AND AT BEDTIME  Primary insomnia -     zolpidem (AMBIEN) 10 MG tablet; Take 1 tablet (10 mg total) by mouth at bedtime as needed.  Generalized idiopathic epilepsy and epileptic syndromes, not intractable, without status epilepticus  Weight gain due to medication Comments: Antipsychotic induced weight gain Orders: -     metFORMIN (GLUCOPHAGE) 500 MG tablet; Take 1 tablet (500 mg total) by mouth 2 (two) times daily with a meal.     Please see After Visit Summary for patient specific instructions.  Future Appointments  Date Time Provider Department Center  12/13/2022 11:00 AM Mathis FareDowd, Deborah, LCSW [facility] None  01/10/2023 12:00 PM Mathis Fareowd, Deborah, LCSW [facility] None  01/31/2023  1:00 PM Corie Chiquitoarter, Zakeria Kulzer, PMHNP [facility] None  09/26/2023 10:00 AM Van ClinesAquino, Karen M, MD [facility] None    No orders of the defined types were placed in this encounter.   -------------------------------

## 2022-12-09 DIAGNOSIS — H2512 Age-related nuclear cataract, left eye: Secondary | ICD-10-CM | POA: Diagnosis not present

## 2022-12-13 ENCOUNTER — Ambulatory Visit: Payer: Medicare Other | Admitting: Psychiatry

## 2022-12-13 DIAGNOSIS — F319 Bipolar disorder, unspecified: Secondary | ICD-10-CM

## 2022-12-13 NOTE — Progress Notes (Signed)
[facility] Counselor/Therapist Progress Note  Patient ID: Theresa [name], MRN: [id number],    Date: 12/13/2022  Time Spent: 50 minutes  Treatment Type: Individual Therapy  Reported Symptoms: anxiety, tardive dyskinesia (legs moving, hands also move but not so bad, "moderate"  Mental Status Exam:  Appearance:   Casual     Behavior:  Appropriate, Sharing, and Motivated  Motor:  Normal  Speech/Language:   Clear and Coherent  Affect:  anxiety  Mood:  Anxious "but not bad"  Thought process:  goal directed  Thought content:    WNL  Sensory/Perceptual disturbances:    WNL  Orientation:  oriented to person, place, time/date, situation, day of week, month of year, year, and stated date of December 13, 2022  Attention:  Good  Concentration:  Fair  Memory:  Short term memory and Dr is aware  Fund of knowledge:   Good  Insight:    Good and Fair  Judgment:   Good  Impulse Control:  Good   Risk Assessment: Danger to Self:  No Self-injurious Behavior: No Danger to Others: No Duty to Warn:no Physical Aggression / Violence:No  Access to Firearms a concern: No  Gang Involvement:No   Subjective:  Patient in today reporting anxiety "but not bad and it's not been bad as I've been real tired." Friends visited recently but have gone home. Visit "overall went well, just tiring, but had some fun." "I don't feel manic and I don't feel depressed" which is not usually the case for me. "In general I've don't pretty well and maybe better this year." Comparatively "my sleep is better even though I still wake up some during the night. Reports little anxiety, no mania, not interrupting people talking, not depressed, and sleep is better than when I'm manic. Motivation is somewhat challenged in some activity including painting, but does enjoy walking the dogs and rides her stationery bike. Trying to increased physical stamina. Staying connected with her friends by phone and setting appropriate limits when  needed.   Interventions: Cognitive Behavioral Therapy and Ego-Supportive  Long term goal: Reduce overall level, frequency, and intensity of the anxiety so that daily functioning is not impaired. Short-term goal: Increase understanding of the beliefs and messages that produce anxiety, depression, and worry. Strategies: Identify , challenge, and replace anxious/negative/depressive self-talk with positive, realistic, and empowering self talk.  Diagnosis:   ICD-10-CM   1. Bipolar 1 disorder  F31.9      Plan:  Patient in session today participating well and reports having had some good time since last appt. Denies mania and depression. Some anxiety, but mild. Is making progress and needs to continues with goal directed behaviors to keep moving in forward direction. Encouraged patient in using more positive and self affirming behaviors as discussed in session including: Believing more in her ability to make positive changes and maintain them, focusing more on the things she can change versus cannot, stay in touch with people who are supportive, participate in more activities that bring her pleasure including spending time with pets/grandchildren/hobbies, use of positive self talk, good boundaries with others, spending some time outside each day, healthy nutrition and exercise, and realize the strength she shows when working with goal-directed behaviors to move in a direction that supports her improved emotional health and overall outlook.  Goal review and progress/challenges noted with patient.  Next appointment within 4 to 5 weeks.  This record has been created using [employer].  Chart creation errors have been sought,  but may not always have been located and corrected.  Such creation errors do not reflect on the standard of medical care provided.   Mathis Fare, LCSW

## 2022-12-19 ENCOUNTER — Other Ambulatory Visit: Payer: Self-pay | Admitting: Psychiatry

## 2022-12-19 DIAGNOSIS — F319 Bipolar disorder, unspecified: Secondary | ICD-10-CM

## 2022-12-20 DIAGNOSIS — H2511 Age-related nuclear cataract, right eye: Secondary | ICD-10-CM | POA: Diagnosis not present

## 2022-12-23 DIAGNOSIS — H2511 Age-related nuclear cataract, right eye: Secondary | ICD-10-CM | POA: Diagnosis not present

## 2023-01-10 ENCOUNTER — Encounter: Payer: Self-pay | Admitting: Family Medicine

## 2023-01-10 ENCOUNTER — Ambulatory Visit: Payer: Medicare Other | Admitting: Psychiatry

## 2023-01-10 ENCOUNTER — Encounter: Payer: Self-pay | Admitting: Neurology

## 2023-01-11 ENCOUNTER — Encounter: Payer: Self-pay | Admitting: Family Medicine

## 2023-01-11 MED ORDER — MAGIC MOUTHWASH W/LIDOCAINE: ORAL | 1 refills | Status: DC

## 2023-01-12 ENCOUNTER — Other Ambulatory Visit: Payer: Self-pay

## 2023-01-12 MED ORDER — MAGIC MOUTHWASH W/LIDOCAINE: ORAL | 1 refills | Status: DC

## 2023-01-21 ENCOUNTER — Encounter (INDEPENDENT_AMBULATORY_CARE_PROVIDER_SITE_OTHER): Payer: Medicare Other | Admitting: Family Medicine

## 2023-01-21 ENCOUNTER — Other Ambulatory Visit: Payer: Self-pay | Admitting: Psychiatry

## 2023-01-21 DIAGNOSIS — F5101 Primary insomnia: Secondary | ICD-10-CM

## 2023-01-21 DIAGNOSIS — I10 Essential (primary) hypertension: Secondary | ICD-10-CM

## 2023-01-21 DIAGNOSIS — F319 Bipolar disorder, unspecified: Secondary | ICD-10-CM

## 2023-01-21 DIAGNOSIS — F4310 Post-traumatic stress disorder, unspecified: Secondary | ICD-10-CM

## 2023-01-21 MED ORDER — LOSARTAN POTASSIUM 25 MG PO TABS
25.0000 mg | ORAL_TABLET | Freq: Every day | ORAL | 3 refills | Status: DC
Start: 2023-01-21 — End: 2023-02-07

## 2023-01-21 NOTE — Telephone Encounter (Signed)
Please see the MyChart message reply(ies) for my assessment and plan.  The patient gave consent for this Medical Advice Message and is aware that it may result in a bill to their insurance company as well as the possibility that this may result in a co-payment or deductible. They are an established patient, but are not seeking medical advice exclusively about a problem treated during an in person or video visit in the last 7 days. I did not recommend an in person or video visit within 7 days of my reply.  I spent a total of 10 minutes cumulative time within 7 days through MyChart messaging Tracer Gutridge, MD  

## 2023-01-21 NOTE — Addendum Note (Signed)
Addended by: Abbe Amsterdam C on: 01/21/2023 04:58 PM   Modules accepted: Orders

## 2023-01-24 ENCOUNTER — Ambulatory Visit: Payer: Medicare Other | Admitting: Psychiatry

## 2023-01-24 DIAGNOSIS — F319 Bipolar disorder, unspecified: Secondary | ICD-10-CM | POA: Diagnosis not present

## 2023-01-24 NOTE — Progress Notes (Signed)
[facility] Counselor/Therapist Progress Note  Patient ID: Theresa [name], MRN: [id number],    Date: 01/24/2023  Time Spent: 50 minutes   Treatment Type: Individual Therapy  Reported Symptoms: depressed, "no mania today", anxiety, denies current SI  Mental Status Exam:  Appearance:   Casual     Behavior:  Appropriate, Sharing, and Motivated  Motor:  Normal  Speech/Language:   Clear and Coherent  Affect:  Depressed and some anxiety  Mood:  anxious and depressed  Thought process:  normal  Thought content:    WNL  Sensory/Perceptual disturbances:    WNL  Orientation:  oriented to person, place, time/date, situation, day of week, month of year, year, and stated date of January 24, 2023  Attention:  Good  Concentration:  Fair  Memory:  Some issues and she feels they are related some to my seizures  Fund of knowledge:   Good  Insight:    Good and Fair  Judgment:   Good  Impulse Control:  Good and Fair   Risk Assessment: Danger to Self:  No Self-injurious Behavior: No Danger to Others: No Duty to Warn:no Physical Aggression / Violence:No  Access to Firearms a concern: No  Gang Involvement:No   Subjective:  Patient in today reporting depressed and anxious. Needed session to talk through her anxiety and depression, more depression than anxious today. Denies any current SI. Shared today how she has in past sometimes had these thoughts but denies any currently. Recognizing her thoughts today are not "as up" as on some occasions but states she finds the following helpful: getting outside and walking, being with my dogs, watch TV, successfully getting her cataract surgery done, "if I need to to do I will take a Xanax and that helps." Smiling appropriately as she speaks. Recognizes some of her strengths, and trying to accept the things I can't change.  "Sleep is about the same".  Bothered by her memory concerns stating "it's not getting worse but it is there."  Shared how this impacts her  in daily life, but also how she recognizes it not getting worse and feels encouraged about that.  Was more positive overall towards herself by end of session.  Reports staying in touch with friends.  Interventions: Cognitive Behavioral Therapy and Ego-Supportive  Long term goal: Reduce overall level, frequency, and intensity of the anxiety so that daily functioning is not impaired. Short-term goal: Increase understanding of the beliefs and messages that produce anxiety, depression, and worry. Strategies: Identify , challenge, and replace anxious/negative/depressive self-talk with positive, realistic, and empowering self talk.   Diagnosis:   ICD-10-CM   1. Bipolar 1 disorder (HCC)  F31.9      Plan: Patient in session today actively participating and working further on her depression and self-care (rates herself a "6" on self-care), trying to understand her confusion about what I need and what I don't need.  Processed this morning session and also acknowledged how she realized was depending on how her mood is, her "thoughts can be different at different times".  Has shown some progress and needs to continue on her medications and also working with treatment goals to continue going in a forward direction. direction. Encouraged patient in her use of positive and self affirming behaviors as noted in session including: Focusing more on the things she can change versus cannot, believe more in herself and her ability to make positive changes and maintain them, stay in touch with supportive people, participate more activities that  bring her pleasure including spending time with her pets/grandchildren/hobbies, good boundaries with others, spending some time outside each day, positive self talk, and recognize the strengths she shows when working with goal-directed behaviors to move in a direction that supports her improved emotional health and overall wellbeing.  Self rating scales: 1-10 depression  scale-5 1-10 anxiety scale-8 1-10 self-esteem scale-2/3 1-10 motivation scale-6 1-10 hopefulness scale-6   Goal review and progress/challenges noted with patient.  Next appointment within 4 to 5 weeks.   Mathis Fare, LCSW

## 2023-01-27 ENCOUNTER — Encounter: Payer: Self-pay | Admitting: Family Medicine

## 2023-01-31 ENCOUNTER — Encounter: Payer: Self-pay | Admitting: Psychiatry

## 2023-01-31 ENCOUNTER — Ambulatory Visit: Payer: Medicare Other | Admitting: Psychiatry

## 2023-01-31 DIAGNOSIS — G2401 Drug induced subacute dyskinesia: Secondary | ICD-10-CM | POA: Diagnosis not present

## 2023-01-31 DIAGNOSIS — F5101 Primary insomnia: Secondary | ICD-10-CM

## 2023-01-31 DIAGNOSIS — F4310 Post-traumatic stress disorder, unspecified: Secondary | ICD-10-CM

## 2023-01-31 MED ORDER — AUSTEDO 9 MG PO TABS
ORAL_TABLET | ORAL | 5 refills | Status: DC
Start: 2023-01-31 — End: 2023-08-01

## 2023-01-31 MED ORDER — AUSTEDO 12 MG PO TABS
ORAL_TABLET | ORAL | 5 refills | Status: DC
Start: 2023-01-31 — End: 2023-08-01

## 2023-01-31 MED ORDER — OLANZAPINE 7.5 MG PO TABS
7.5000 mg | ORAL_TABLET | Freq: Every day | ORAL | Status: DC
Start: 2023-01-31 — End: 2023-04-22

## 2023-01-31 NOTE — Progress Notes (Signed)
Theresa [name] [id number] 1956/07/24 67 y.o.  Subjective:   Patient ID:  Theresa [name] is a 67 y.o. (DOB 05-04-1956) female.  Chief Complaint:  Chief Complaint  Patient presents with  . Depression    HPI Theresa [name] presents to the office today for follow-up of Bipolar Disorder, Anxiety, and insomnia.   She reports, "I think I have been more on the depressed side" and that her husband has noticed some manic s/s. She reports that her mood has been slightly low. She reports that she has experienced some fatigue with BP medication.   She reduced Olanzapine to 7.5 mg po QHS "about a week ago" and has continued to sleep ok. Reports going to bed early and "getting enough sleep." Her husband reports that her sleep has not been consistent, and she disagrees with this. He reports that he is often awakened by her getting up in the middle of the night.   Her husband notices "ping-pong thinking" where her thoughts bounce around in subject. He reports that she will says she has to say something before she forgets it. Husband reports "it's like she is being driven" to say certain things or initiate a task. He reports that it is "multiple times" she will come to talk with him about things. Denies increased goal-directed activity. She reports some anxiety and is working on differentiating between anxiety and mania. She reports that she has recently been taking more Xanax prn than she typically does. She reports that she has occasionally been taking Xanax prn twice daily. She reports that "with anxiety I have a feeling in the pit of my stomach." She reports low energy and motivation. Concentration and focus "go in and out." She sometimes has difficulty completing art work because of concentration. Denies SI at time of exam. She reports that she has occasional suicidal thoughts that she has discussed with her therapist and her husband. She reports that she had thoughts of self-harm with taking Depakote in  the past and this resolved when Depakote was switched to Keppra. She reports that she has occasional suicidal thoughts- "I think it is part of my Bipolar Disorder." She contracts for safety and reports that she would talk to husband if she had thoughts of suicide.   She reports TD interferes with art work. Husband does most of the cooking since it is difficult to stand still.   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   Past Psychiatric Medication Trials: Olanzapine Seroquel Saphris Depakote-self-injurious behavior Lamictal Keppra Carbamazepine Xanax Klonopin Ambien Trazodone Benztropine Ingrezza Propranolol- Ineffective  AIMS    Flowsheet Row Office Visit from 01/31/2023 in [facility] Office Visit from 09/30/2022 in [facility] Office Visit from 05/20/2022 in [facility] Office Visit from 10/13/2021 in [facility] Office Visit from 08/04/2021 in [facility]  AIMS Total Score 10 13 8 6 9       Mini-Mental    Flowsheet Row Office Visit from 02/28/2017 in [facility]  Total Score (max 30 points ) 29      [id number]    Flowsheet Row Clinical Support from 05/27/2022 in [facility] at [facility] Office Visit from 05/17/2022 in [facility] at [facility] Office Visit from 02/28/2017 in [facility] at [facility]  Visit from 09/19/2015 in Primary Care at [facility] Visit from 06/05/2015 in Primary Care at [facility] Total Score 0 1 0 0 0        Review of Systems:  Review of Systems  Cardiovascular:        She reports increased BP, possibly since seizure.   Musculoskeletal:  Negative for gait problem.  Neurological:        Possible seizure activity in May (bit her tongue in  her sleep).  Psychiatric/Behavioral:         Please refer to HPI    Medications: I have reviewed the patient's current medications.  Current Outpatient Medications  Medication Sig Dispense Refill  . ALPRAZolam (XANAX XR) 2 MG 24 hr tablet TAKE 1 TABLET BY MOUTH IN THE  MORNING AND AT BEDTIME 180 tablet 1  . ALPRAZolam (XANAX) 1 MG tablet TAKE 1 TABLET BY MOUTH DAILY AS NEEDED FOR SEVERE ANXIETY 30 tablet 3  . carbamazepine (CARBATROL) 300 MG 12 hr capsule Take 1 capsule (300 mg total) by mouth every morning AND 2 capsules (600 mg total) at bedtime. 270 capsule 1  . cetirizine (ZYRTEC) 10 MG tablet Take 10 mg by mouth daily as needed for allergies or rhinitis.    . clonazePAM (KLONOPIN) 1 MG tablet Take 1 tablet (1 mg total) by mouth at bedtime. 30 tablet 5  . Deutetrabenazine (AUSTEDO) 12 MG TABS Take 1 tablet po BID with a 9 mg tablet to equal 21 mg po BID 60 tablet 5  . Deutetrabenazine (AUSTEDO) 9 MG TABS Take 1 tab po BID with a 12 mg tab to equal 21 mg BID 60 tablet 5  . docusate sodium (COLACE) 50 MG capsule Take 50 mg by mouth daily as needed for mild constipation.    . fluticasone (FLONASE) 50 MCG/ACT nasal spray Place 1 spray into both nostrils daily as needed for allergies or rhinitis.    Marland Kitchen levETIRAcetam (KEPPRA) 500 MG tablet Take 1 tablet (500 mg total) by mouth 2 (two) times daily. 200 tablet 3  . losartan (COZAAR) 25 MG tablet Take 1 tablet (25 mg total) by mouth daily. (Patient taking differently: Take 50 mg by mouth daily.) 30 tablet 3  . lovastatin (MEVACOR) 20 MG tablet Take 1 tablet (20 mg total) by mouth daily. 90 tablet 3  . magic mouthwash w/lidocaine SOLN Diphenhydramine 12.5 mg/5 mL 1 part Viscous lidocaine 2% 1 part Maalox 1 part  Swish, hold, and spit or swallow 30 mL TID 900 mL 1  . Melatonin 10 MG TABS Take 20 mg by mouth at bedtime.    . metFORMIN (GLUCOPHAGE) 500 MG tablet Take 1 tablet (500 mg total) by mouth 2 (two) times daily with a meal. 180 tablet 2  .  Multiple Vitamins-Minerals (CENTRUM SILVER PO) Take 1 tablet by mouth 2 (two) times a week.    Marland Kitchen OLANZapine (ZYPREXA) 10 MG tablet TAKE 1 TABLET BY MOUTH EVERYDAY AT BEDTIME (Patient taking differently: Take 7.5 mg by mouth daily.) 30 tablet 1  . pantoprazole (PROTONIX) 40 MG tablet TAKE 1 TABLET BY MOUTH DAILY AS  NEEDED 100 tablet 2  . polyethylene glycol (MIRALAX / GLYCOLAX) 17 g packet Take 17 g by mouth daily as needed for mild constipation.    Marland Kitchen tiZANidine (ZANAFLEX) 2 MG tablet Take 1 tablet (2 mg total) by mouth every 6 (six) hours as needed for muscle spasms. 30 tablet 1  . traZODone (DESYREL) 100 MG tablet Take 1-2 tablets (100-200  mg total) by mouth at bedtime as needed. for sleep 180 tablet 3  . zolpidem (AMBIEN) 10 MG tablet Take 1 tablet (10 mg total) by mouth at bedtime as needed. 30 tablet 5   No current facility-administered medications for this visit.    Medication Side Effects: Other: Tremor, TD  Allergies:  Allergies  Allergen Reactions  . Cyclobenzaprine Other (See Comments)    Blisters in mouth  . Lamictal [Lamotrigine]     Acute renal failure  . Lybalvi [Olanzapine-Samidorphan] Other (See Comments)    Nausea, dizziness  . Ropinirole Nausea And Vomiting  . Tramadol Other (See Comments)    Reaction??  . Acetaminophen Itching, Swelling and Rash  . Codeine Swelling, Rash and Other (See Comments)    Can take hydrocodone  . Penicillins     Abdominal pain    Past Medical History:  Diagnosis Date  . Adenomatous colon polyp   . Allergy   . Anxiety   . Bipolar 1 disorder (HCC)   . Blood transfusion without reported diagnosis   . Depression   . Elevated LFTs   . GERD (gastroesophageal reflux disease)   . Hepatic steatosis 05/07/2013  . Hyperlipidemia   . Seizures (HCC) 1977   Last seizure 01/2020  . Tardive dyskinesia     Past Medical History, Surgical history, Social history, and Family history were reviewed and updated as appropriate.   Please see  review of systems for further details on the patient's review from today.   Objective:   Physical Exam:  BP (!) 155/105   Pulse 100   Physical Exam Constitutional:      General: She is not in acute distress. Musculoskeletal:        General: No deformity.  Neurological:     Mental Status: She is alert and oriented to person, place, and time.     Coordination: Coordination normal.  Psychiatric:        Attention and Perception: Attention and perception normal. She does not perceive auditory or visual hallucinations.        Mood and Affect: Mood is not anxious. Affect is not labile, blunt, angry or inappropriate.        Speech: Speech normal.        Behavior: Behavior normal.        Thought Content: Thought content normal. Thought content is not paranoid or delusional. Thought content does not include homicidal or suicidal ideation. Thought content does not include homicidal or suicidal plan.        Cognition and Memory: Cognition and memory normal.        Judgment: Judgment normal.     Comments: Insight intact Mood is mildly depressed    Lab Review:     Component Value Date/Time   NA 134 (L) 06/15/2022 0457   K 3.4 (L) 06/15/2022 0457   CL 102 06/15/2022 0457   CO2 21 (L) 06/15/2022 0457   GLUCOSE 118 (H) 06/15/2022 0457   BUN 10 06/15/2022 0457   CREATININE 0.77 06/15/2022 0457   CREATININE 0.79 05/08/2020 1015   CALCIUM 8.9 06/15/2022 0457   PROT 7.3 06/15/2022 0457   ALBUMIN 4.4 06/15/2022 0457   AST 113 (H) 06/15/2022 0457   ALT 38 06/15/2022 0457   ALKPHOS 81 06/15/2022 0457   BILITOT 0.9 06/15/2022 0457   GFRNONAA >60 06/15/2022 0457       Component Value Date/Time   WBC 14.7 (H) 06/15/2022 0457   RBC 4.91 06/15/2022 0457   HGB  14.3 06/15/2022 0457   HCT 41.4 06/15/2022 0457   PLT 247 06/15/2022 0457   MCV 84.3 06/15/2022 0457   MCV 87.7 02/07/2013 1110   MCH 29.1 06/15/2022 0457   MCHC 34.5 06/15/2022 0457   RDW 12.9 06/15/2022 0457   LYMPHSABS 1.1  06/14/2022 1229   MONOABS 1.2 (H) 06/14/2022 1229   EOSABS 0.0 06/14/2022 1229   BASOSABS 0.0 06/14/2022 1229    No results found for: "POCLITH", "LITHIUM"   Lab Results  Component Value Date   CBMZ 5.2 02/01/2019     .res Assessment: Plan:    There are no diagnoses linked to this encounter.   Please see After Visit Summary for patient specific instructions.  Future Appointments  Date Time Provider Department Center  02/07/2023 11:00 AM Copland, [name], MD [facility]  02/28/2023  3:00 PM Mathis Fare, LCSW [facility] None  09/26/2023 10:00 AM Van Clines, MD [facility] None    No orders of the defined types were placed in this encounter.   -------------------------------

## 2023-02-03 ENCOUNTER — Encounter: Payer: Self-pay | Admitting: Family Medicine

## 2023-02-05 NOTE — Patient Instructions (Signed)
It was great to see you again today, I will be in touch with your lab work 

## 2023-02-05 NOTE — Progress Notes (Signed)
[facility] [address], [address] [place], [place] [address] [phone]  Date:  02/07/2023   Name:  Theresa [name]   DOB:  Dec 23, 1955   MRN:  [id number]  PCP:  Pearline Cables, MD    Chief Complaint: follow up blood pressure (Pt was started on Losartan 25 on 01/21/23, then increased to 50 mg on 02/04/23./Concerns/ questions: 1. Fatigue, wonders if this could be due to medications. 2. Grandma seizure at the end of October, normal recovery for her is about 6 months.  Pt asks if the recent sizure could have had any effect on her BP. 3. Frequent urination in small amounts for a few years now.)   History of Present Illness:  Theresa [name] is a 67 y.o. very pleasant female patient who presents with the following:  Patient seen today with blood pressure concern Patient is seen virtually because MD is with COVID.  Patient identity from with 2 factors, she is consent for virtual visit today.  Patient identity confirmed with 2 factors.  Patient, myself and patient's husband are present on visit today History of bipolar disorder and epilepsy, tardive dyskinesia, dyslipidemia -recently elevated blood pressures consistent with essential hypertension Most recent visit with myself was in November although we have exchanged several MyChart messages since that time At her last visit she had recently had a seizure during colonoscopy procedure, thought potentially due to not taking her benzodiazepines prior to her procedure  She contacted me last month with concern of elevated blood pressures noted by her neurologist She continue checking her blood pressure for a week or 2 after neurology visit and noted persistent elevation We started her on losartan 25, eventually went up to losartan 50mg  daily with good results Can update blood work today and get updated EKG-most recent EKG on chart from 2020  She notes her home BP has still been high although  improved She took her losartan this am like usual  They have noted SBP 150ish DBP 90- 100 They have checked their cuff on husband and it seems to be accurate She has not had issues with BP  She has no symptoms except she is feeling fatigued "for a long time" No CP, no SOB.  She will get an occasional headache She is trying to exercise   She notes her mother did have HTN- she is not sure if she took any medication. 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) 07/25/2018   Posttraumatic stress disorder 07/25/2018   Generalized idiopathic epilepsy and epileptic syndromes, not intractable, without status epilepticus (HCC) 06/24/2017   Tardive dyskinesia 01/21/2017   Special screening for malignant neoplasms, colon 06/16/2012   Personal history of colonic polyps 06/16/2012   Bipolar disorder (HCC) 04/02/2012    Past Medical History:  Diagnosis Date   Adenomatous colon polyp  Allergy    Anxiety    Bipolar 1 disorder (HCC)    Blood transfusion without reported diagnosis    Depression    Elevated LFTs    GERD (gastroesophageal reflux disease)    Hepatic steatosis 05/07/2013   Hyperlipidemia    Seizures (HCC) 1977   Last seizure 01/2020   Tardive dyskinesia     Past Surgical History:  Procedure Laterality Date   APPENDECTOMY     COLONOSCOPY  06/16/2012   Procedure: COLONOSCOPY;   Surgeon: Hart Carwin, MD;  Location: [facility];  Service: Endoscopy;  Laterality: N/A;   DILATION AND CURETTAGE OF UTERUS     KNEE ARTHROSCOPY WITH MENISCAL REPAIR Left    NECK SURGERY     plate with screws to 3 cervical spines    Social History   Tobacco Use   Smoking status: Never   Smokeless tobacco: Never  Vaping Use   Vaping Use: Never used  Substance Use Topics   Alcohol use: Yes    Comment: occasionaly 1 a month   Drug use: Never    Family History  Problem Relation Age of Onset   Colon polyps Mother    Cancer Mother    Heart disease Mother    Alcoholism Mother    Hypertension Mother    Colon polyps Father    Cancer Father    Alcoholism Father    Bipolar disorder Father    Drug abuse Brother    Bipolar disorder Other    Colon cancer Neg Hx    Stomach cancer Neg Hx    Breast cancer Neg Hx    Esophageal cancer Neg Hx    Rectal cancer Neg Hx     Allergies  Allergen Reactions   Cyclobenzaprine Other (See Comments)    Blisters in mouth   Lamictal [Lamotrigine]     Acute renal failure   Lybalvi [Olanzapine-Samidorphan] Other (See Comments)    Nausea, dizziness   Ropinirole Nausea And Vomiting   Tramadol Other (See Comments)    Reaction??   Acetaminophen Itching, Swelling and Rash   Codeine Swelling, Rash and Other (See Comments)    Can take hydrocodone   Penicillins     Abdominal pain    Medication list has been reviewed and updated.  Current Outpatient Medications on File Prior to Visit  Medication Sig Dispense Refill   ALPRAZolam (XANAX XR) 2 MG 24 hr tablet TAKE 1 TABLET BY MOUTH IN THE  MORNING AND AT BEDTIME 180 tablet 1   ALPRAZolam (XANAX) 1 MG tablet TAKE 1 TABLET BY MOUTH DAILY AS NEEDED FOR SEVERE ANXIETY 30 tablet 3   carbamazepine (CARBATROL) 300 MG 12 hr capsule Take 1 capsule (300 mg total) by mouth every morning AND 2 capsules (600 mg total) at bedtime. 270 capsule 1   cetirizine (ZYRTEC) 10 MG tablet Take 10 mg by mouth daily as  needed for allergies or rhinitis.     clonazePAM (KLONOPIN) 1 MG tablet Take 1 tablet (1 mg total) by mouth at bedtime. 30 tablet 5   Deutetrabenazine (AUSTEDO) 12 MG TABS Take 1 tablet po BID with a 9 mg tablet to equal 21 mg po BID 60 tablet 5   Deutetrabenazine (AUSTEDO) 9 MG TABS Take 1 tab po BID with a 12 mg tab to equal 21 mg BID 60 tablet 5   docusate sodium (COLACE) 50 MG capsule Take 50 mg by mouth daily as needed for mild constipation.     fluticasone (FLONASE) 50  MCG/ACT nasal spray Place 1 spray into both nostrils daily as needed for allergies or rhinitis.     levETIRAcetam (KEPPRA) 500 MG tablet Take 1 tablet (500 mg total) by mouth 2 (two) times daily. 200 tablet 3   losartan (COZAAR) 25 MG tablet Take 1 tablet (25 mg total) by mouth daily. (Patient taking differently: Take 50 mg by mouth daily.) 30 tablet 3   lovastatin (MEVACOR) 20 MG tablet Take 1 tablet (20 mg total) by mouth daily. 90 tablet 3   magic mouthwash w/lidocaine SOLN Diphenhydramine 12.5 mg/5 mL 1 part Viscous lidocaine 2% 1 part Maalox 1 part  Swish, hold, and spit or swallow 30 mL TID 900 mL 1   Melatonin 10 MG TABS Take 20 mg by mouth at bedtime.     metFORMIN (GLUCOPHAGE) 500 MG tablet Take 1 tablet (500 mg total) by mouth 2 (two) times daily with a meal. 180 tablet 2   Multiple Vitamins-Minerals (CENTRUM SILVER PO) Take 1 tablet by mouth 2 (two) times a week.     OLANZapine (ZYPREXA) 7.5 MG tablet Take 1 tablet (7.5 mg total) by mouth at bedtime.     pantoprazole (PROTONIX) 40 MG tablet TAKE 1 TABLET BY MOUTH DAILY AS  NEEDED 100 tablet 2   polyethylene glycol (MIRALAX / GLYCOLAX) 17 g packet Take 17 g by mouth daily as needed for mild constipation.     tiZANidine (ZANAFLEX) 2 MG tablet Take 1 tablet (2 mg total) by mouth every 6 (six) hours as needed for muscle spasms. 30 tablet 1   traZODone (DESYREL) 100 MG tablet Take 1-2 tablets (100-200 mg total) by mouth at bedtime as needed. for sleep 180 tablet 3    zolpidem (AMBIEN) 10 MG tablet Take 1 tablet (10 mg total) by mouth at bedtime as needed. 30 tablet 5   No current facility-administered medications on file prior to visit.    Review of Systems:  As per HPI- otherwise negative.   Physical Examination: Vitals:   02/07/23 1039  BP: (!) 152/98  Pulse: 84  Resp: 18  Temp: 97.6 F (36.4 C)  SpO2: 95%   Vitals:   02/07/23 1039  Weight: 184 lb 6.4 oz (83.6 kg)  Height: 5\' 6"  (1.676 m)   Body mass index is 29.76 kg/m. Ideal Body Weight: Weight in (lb) to have BMI = 25: 154.6 Spoke with pt on phone as they were not able to obtain video access   EKG: Sinus rhythm, T wave changes in V3, otherwise no change noted compared with EKG June 2020  Wt Readings from Last 3 Encounters:  02/07/23 184 lb 6.4 oz (83.6 kg)  09/30/22 183 lb 9.6 oz (83.3 kg)  06/23/22 178 lb 3.2 oz (80.8 kg)   Spoke with patient via telephone due to video not working.  Patient sounds well, no distress or shortness of breath Results for orders placed or performed in visit on 02/07/23  POCT urinalysis dipstick  Result Value Ref Range   Color, UA yellow yellow   Clarity, UA clear clear   Glucose, UA negative negative mg/dL   Bilirubin, UA negative negative   Ketones, POC UA negative negative mg/dL   Spec Grav, UA 9.604 5.409 - 1.025   Blood, UA negative negative   pH, UA 6.5 5.0 - 8.0   Protein Ur, POC negative negative mg/dL   Urobilinogen, UA 0.2 0.2 or 1.0 E.U./dL   Nitrite, UA Negative Negative   Leukocytes, UA Trace (A) Negative  Assessment and Plan: Essential hypertension - Plan: losartan (COZAAR) 100 MG tablet, EKG 12-Lead, CBC, Comprehensive metabolic panel, CANCELED: CBC, CANCELED: Comprehensive metabolic panel  Screening for diabetes mellitus - Plan: Hemoglobin A1c, CANCELED: Hemoglobin A1c  Screening for deficiency anemia - Plan: CBC, CANCELED: CBC  Dyslipidemia - Plan: Lipid panel, CANCELED: Lipid panel  Screening for thyroid disorder  - Plan: TSH, CANCELED: TSH  Vitamin D deficiency - Plan: VITAMIN D 25 Hydroxy (Vit-D Deficiency, Fractures), CANCELED: VITAMIN D 25 Hydroxy (Vit-D Deficiency, Fractures)  Gastroesophageal reflux disease, unspecified whether esophagitis present - Plan: pantoprazole (PROTONIX) 40 MG tablet  Urinary frequency - Plan: POCT urinalysis dipstick, Urine Culture, CANCELED: Urine Culture  Weight gain Virtual visit today to discuss a few concerns Will increase losartan to 100 mg- she will let me know how her blood pressure responds EKG today- stable Labs pending to monitor her lipids, screen for thyroid disorder, anemia, diabetes No urinary frequency, urine culture is pending Will plan further follow- up pending labs.  Spent 13 minutes on phone with patient today  Signed Abbe Amsterdam, MD

## 2023-02-06 ENCOUNTER — Encounter: Payer: Self-pay | Admitting: Family Medicine

## 2023-02-07 ENCOUNTER — Other Ambulatory Visit (INDEPENDENT_AMBULATORY_CARE_PROVIDER_SITE_OTHER): Payer: Medicare Other

## 2023-02-07 ENCOUNTER — Other Ambulatory Visit: Payer: Self-pay | Admitting: Psychiatry

## 2023-02-07 ENCOUNTER — Telehealth (INDEPENDENT_AMBULATORY_CARE_PROVIDER_SITE_OTHER): Payer: Medicare Other | Admitting: Family Medicine

## 2023-02-07 ENCOUNTER — Telehealth: Payer: Self-pay | Admitting: Psychiatry

## 2023-02-07 VITALS — BP 160/108 | HR 84 | Temp 97.6°F | Resp 18 | Ht 66.0 in | Wt 184.4 lb

## 2023-02-07 DIAGNOSIS — K219 Gastro-esophageal reflux disease without esophagitis: Secondary | ICD-10-CM

## 2023-02-07 DIAGNOSIS — E559 Vitamin D deficiency, unspecified: Secondary | ICD-10-CM

## 2023-02-07 DIAGNOSIS — Z131 Encounter for screening for diabetes mellitus: Secondary | ICD-10-CM

## 2023-02-07 DIAGNOSIS — E785 Hyperlipidemia, unspecified: Secondary | ICD-10-CM

## 2023-02-07 DIAGNOSIS — Z130 Encounter for screening for diseases of the blood and blood-forming organs and certain disorders involving the immune mechanism: Secondary | ICD-10-CM | POA: Diagnosis not present

## 2023-02-07 DIAGNOSIS — R635 Abnormal weight gain: Secondary | ICD-10-CM

## 2023-02-07 DIAGNOSIS — R350 Frequency of micturition: Secondary | ICD-10-CM

## 2023-02-07 DIAGNOSIS — Z1329 Encounter for screening for other suspected endocrine disorder: Secondary | ICD-10-CM

## 2023-02-07 DIAGNOSIS — I10 Essential (primary) hypertension: Secondary | ICD-10-CM

## 2023-02-07 DIAGNOSIS — F4310 Post-traumatic stress disorder, unspecified: Secondary | ICD-10-CM

## 2023-02-07 DIAGNOSIS — F5101 Primary insomnia: Secondary | ICD-10-CM

## 2023-02-07 LAB — LIPID PANEL
Cholesterol: 196 mg/dL (ref 0–200)
HDL: 72.9 mg/dL (ref >39.00)
LDL Cholesterol: 97 mg/dL (ref 0–99)
NonHDL: 123.12
Total CHOL/HDL Ratio: 3
Triglycerides: 130 mg/dL (ref 0.0–149.0)
VLDL: 26 mg/dL (ref 0.0–40.0)

## 2023-02-07 LAB — COMPREHENSIVE METABOLIC PANEL
ALT: 24 U/L (ref 0–35)
AST: 19 U/L (ref 0–37)
Albumin: 4.6 g/dL (ref 3.5–5.2)
Alkaline Phosphatase: 107 U/L (ref 39–117)
BUN: 10 mg/dL (ref 6–23)
CO2: 29 mEq/L (ref 19–32)
Calcium: 9.5 mg/dL (ref 8.4–10.5)
Chloride: 98 mEq/L (ref 96–112)
Creatinine, Ser: 0.75 mg/dL (ref 0.40–1.20)
GFR: 82.71 mL/min (ref >60.00)
Glucose, Bld: 95 mg/dL (ref 70–99)
Potassium: 5.6 mEq/L — ABNORMAL HIGH (ref 3.5–5.1)
Sodium: 136 mEq/L (ref 135–145)
Total Bilirubin: 0.4 mg/dL (ref 0.2–1.2)
Total Protein: 7.1 g/dL (ref 6.0–8.3)

## 2023-02-07 LAB — TSH: TSH: 1.81 u[IU]/mL (ref 0.35–5.50)

## 2023-02-07 LAB — CBC
HCT: 40.5 % (ref 36.0–46.0)
Hemoglobin: 13.3 g/dL (ref 12.0–15.0)
MCHC: 32.8 g/dL (ref 30.0–36.0)
MCV: 87.4 fl (ref 78.0–100.0)
Platelets: 240 10*3/uL (ref 150.0–400.0)
RBC: 4.63 Mil/uL (ref 3.87–5.11)
RDW: 14.1 % (ref 11.5–15.5)
WBC: 4.6 10*3/uL (ref 4.0–10.5)

## 2023-02-07 LAB — POCT URINALYSIS DIP (MANUAL ENTRY)
Bilirubin, UA: NEGATIVE
Blood, UA: NEGATIVE
Glucose, UA: NEGATIVE mg/dL
Ketones, POC UA: NEGATIVE mg/dL
Nitrite, UA: NEGATIVE
Protein Ur, POC: NEGATIVE mg/dL
Spec Grav, UA: 1.01 (ref 1.010–1.025)
Urobilinogen, UA: 0.2 E.U./dL
pH, UA: 6.5 (ref 5.0–8.0)

## 2023-02-07 LAB — VITAMIN D 25 HYDROXY (VIT D DEFICIENCY, FRACTURES): VITD: 18.41 ng/mL — ABNORMAL LOW (ref 30.00–100.00)

## 2023-02-07 LAB — HEMOGLOBIN A1C: Hgb A1c MFr Bld: 5.7 % (ref 4.6–6.5)

## 2023-02-07 MED ORDER — PANTOPRAZOLE SODIUM 40 MG PO TBEC
40.0000 mg | DELAYED_RELEASE_TABLET | Freq: Every day | ORAL | 3 refills | Status: DC | PRN
Start: 2023-02-07 — End: 2023-06-16

## 2023-02-07 MED ORDER — LOSARTAN POTASSIUM 100 MG PO TABS: 100.0000 mg | ORAL_TABLET | Freq: Every day | ORAL | 3 refills | Status: DC

## 2023-02-07 NOTE — Telephone Encounter (Signed)
Pt LVM @ 3:49p.  She wanted to advise Shanda Bumps that she is going back to take 10mg  of Olanzapine because she is having issues with sleep at the 7.5mg  dose.  If that is a problem, pls let her know.  Next appt 8/6

## 2023-02-08 ENCOUNTER — Encounter: Payer: Self-pay | Admitting: Family Medicine

## 2023-02-08 ENCOUNTER — Other Ambulatory Visit: Payer: Self-pay | Admitting: Family Medicine

## 2023-02-08 DIAGNOSIS — E875 Hyperkalemia: Secondary | ICD-10-CM

## 2023-02-08 DIAGNOSIS — E559 Vitamin D deficiency, unspecified: Secondary | ICD-10-CM

## 2023-02-08 LAB — URINE CULTURE
MICRO NUMBER:: 15090607
Result:: NO GROWTH
SPECIMEN QUALITY:: ADEQUATE

## 2023-02-08 NOTE — Progress Notes (Signed)
Received labs as below, message to pt    Chemistry      Component Value Date/Time   NA 136 02/07/2023 1141   K 5.6 No hemolysis seen (H) 02/07/2023 1141   CL 98 02/07/2023 1141   CO2 29 02/07/2023 1141   BUN 10 02/07/2023 1141   CREATININE 0.75 02/07/2023 1141   CREATININE 0.79 05/08/2020 1015      Component Value Date/Time   CALCIUM 9.5 02/07/2023 1141   ALKPHOS 107 02/07/2023 1141   AST 19 02/07/2023 1141   ALT 24 02/07/2023 1141   BILITOT 0.4 02/07/2023 1141     Lab Results  Component Value Date   WBC 4.6 02/07/2023   HGB 13.3 02/07/2023   HCT 40.5 02/07/2023   MCV 87.4 02/07/2023   PLT 240.0 02/07/2023   Lipids:    Component Value Date/Time   CHOL 196 02/07/2023 1141   TRIG 130.0 02/07/2023 1141   HDL 72.90 02/07/2023 1141   LDLDIRECT 104.0 05/17/2022 1147   VLDL 26.0 02/07/2023 1141   CHOLHDL 3 02/07/2023 1141   Lab Results  Component Value Date   HGBA1C 5.7 02/07/2023   Vitamin D low at 18.41 Lab Results  Component Value Date   TSH 1.81 02/07/2023

## 2023-02-09 ENCOUNTER — Encounter: Payer: Self-pay | Admitting: Family Medicine

## 2023-02-09 MED ORDER — VITAMIN D3 1.25 MG (50000 UT) PO CAPS
ORAL_CAPSULE | ORAL | 0 refills | Status: DC
Start: 2023-02-09 — End: 2024-07-05

## 2023-02-10 ENCOUNTER — Encounter: Payer: Self-pay | Admitting: Family Medicine

## 2023-02-11 ENCOUNTER — Encounter: Payer: Self-pay | Admitting: Family Medicine

## 2023-02-11 ENCOUNTER — Other Ambulatory Visit (INDEPENDENT_AMBULATORY_CARE_PROVIDER_SITE_OTHER): Payer: Medicare Other

## 2023-02-11 DIAGNOSIS — E875 Hyperkalemia: Secondary | ICD-10-CM | POA: Diagnosis not present

## 2023-02-11 DIAGNOSIS — Z5181 Encounter for therapeutic drug level monitoring: Secondary | ICD-10-CM

## 2023-02-11 LAB — POTASSIUM: Potassium: 5.4 mEq/L — ABNORMAL HIGH (ref 3.5–5.1)

## 2023-02-12 MED ORDER — LOSARTAN POTASSIUM-HCTZ 50-12.5 MG PO TABS
1.0000 | ORAL_TABLET | Freq: Every day | ORAL | 3 refills | Status: DC
Start: 2023-02-12 — End: 2023-03-08

## 2023-02-16 ENCOUNTER — Encounter: Payer: Self-pay | Admitting: Family Medicine

## 2023-02-21 ENCOUNTER — Encounter: Payer: Self-pay | Admitting: Family Medicine

## 2023-02-21 ENCOUNTER — Other Ambulatory Visit (INDEPENDENT_AMBULATORY_CARE_PROVIDER_SITE_OTHER): Payer: Medicare Other

## 2023-02-21 DIAGNOSIS — Z5181 Encounter for therapeutic drug level monitoring: Secondary | ICD-10-CM | POA: Diagnosis not present

## 2023-02-21 LAB — BASIC METABOLIC PANEL
BUN: 11 mg/dL (ref 6–23)
CO2: 30 mEq/L (ref 19–32)
Calcium: 9.8 mg/dL (ref 8.4–10.5)
Chloride: 90 mEq/L — ABNORMAL LOW (ref 96–112)
Creatinine, Ser: 0.73 mg/dL (ref 0.40–1.20)
GFR: 85.41 mL/min (ref >60.00)
Glucose, Bld: 111 mg/dL — ABNORMAL HIGH (ref 70–99)
Potassium: 4.4 mEq/L (ref 3.5–5.1)
Sodium: 131 mEq/L — ABNORMAL LOW (ref 135–145)

## 2023-02-28 ENCOUNTER — Ambulatory Visit: Payer: Medicare Other | Admitting: Psychiatry

## 2023-02-28 DIAGNOSIS — F319 Bipolar disorder, unspecified: Secondary | ICD-10-CM | POA: Diagnosis not present

## 2023-02-28 NOTE — Progress Notes (Signed)
[facility] Counselor/Therapist Progress Note  Patient ID: Theresa [name], MRN: [id number],    Date: 02/28/2023  Time Spent: 53 minutes   Treatment Type: Individual Therapy  Reported Symptoms: anxiety, noticing some memory issues "but not any worse", my tardive dyskinesia is still present and her med provider is aware and has added med for patient  Mental Status Exam:  Appearance:   Casual     Behavior:  Appropriate, Sharing, and Motivated  Motor:  Some lingering tardive dyskinesia  Speech/Language:   Clear and Coherent  Affect:  anxiouos  Mood:  anxious  Thought process:  goal directed  Thought content:    WNL  Sensory/Perceptual disturbances:    WNL  Orientation:  oriented to person, place, time/date, situation, day of week, month of year, year, and stated date of February 28, 2023  Attention:  Fair  Concentration:  Fair  Memory:  Some short and long term memory issues and states Dr is aware  [employer] of knowledge:   Fair  Insight:    Fair  Judgment:   Good  Impulse Control:  Good and Fair   Risk Assessment: Danger to Self:  No Self-injurious Behavior: No Danger to Others: No Duty to Warn:no Physical Aggression / Violence:No  Access to Firearms a concern: No  Gang Involvement:No   Subjective:  Patient in session today reporting anxiety, some continued memory issues (her Dr is aware), and tardive dyskinesia (some worse after exercising). Feels anxiety is currently her main symptom and talked through this more today, "but also feel calm at times". Reports" having mania at times and is a quick cycler"; adds that she doesn't tend to "go down very quickly unless someone says something that hurts my feelings. States her direct and indirect contact with friends who mostly people she has met through dog events.  Shared that she get lonesome sometimes and likes phone contact although sometimes some nervousness in in-person contacts. Discussed this in detail with patient along with some  options of area activities designed for senior adults that might be appropriate for patient and not be a threatening environment which we discussed and she seemed interested.  Gave her the information on how to contact them and she seemed to be looking forward to following through on this.  And commented about her memory not being as good but does not feel "that it is getting any worse at this point".  Denies any SI and actually showed more interest in the senior adult center than expected, including questions and comments.  Continues to stay in touch with friends but mostly "not in person".  Interventions: Cognitive Behavioral Therapy and Ego-Supportive  Long term goal: Reduce overall level, frequency, and intensity of the anxiety so that daily functioning is not impaired. Short-term goal: Increase understanding of the beliefs and messages that produce anxiety, depression, and worry. Strategies: Identify , challenge, and replace anxious/negative/depressive self-talk with positive, realistic, and empowering self talk.  Diagnosis:   ICD-10-CM   1. Bipolar 1 disorder (HCC)  F31.9      Plan:  Patient and for session today and participated well as she focused more on her anxiety, some memory issues, or tardive dyskinesia, but centered more on her anxiety and not having much time around other people outside the home.  As noted above, she responded well to some information I shared with her about a senior adult center locally that offers a variety of activities, many of which are free or at a very  low cost and they focus on ages 66 and up.  Patient is showing progress and needs to continue working with goal-directed behaviors in order to keep moving in a forward direction. Encouraged patient in using more positive and self affirming behaviors as noted in session including: Focusing more on the things she can change rather than cannot, believe more in herself and her ability to make positive changes and  maintain them, stay in touch with supportive people, participate more in activities that bring her pleasure including spending time with her pets/grandchildren/hobbies, good boundaries with others, spending time outside each day, positive self talk, and recognize the strength she shows when working with goal-directed behaviors to move in a direction that supports her improved emotional health and outlook.  Self rating scales: February 28, 2023 1-10 depression scale-4 1-10 anxiety scale-8 1-10 self-esteem scale-3 1-10 motivation scale-6 1-10 hopefulness scale-6  Goal review and progress/challenges noted with patient.  Next appointment within 3 to 4 weeks.   Mathis Fare, LCSW

## 2023-03-07 ENCOUNTER — Encounter: Payer: Self-pay | Admitting: Family Medicine

## 2023-03-08 MED ORDER — LOSARTAN POTASSIUM-HCTZ 50-12.5 MG PO TABS: 1.0000 | ORAL_TABLET | Freq: Every day | ORAL | 3 refills | Status: DC

## 2023-03-08 NOTE — Addendum Note (Signed)
Addended by: Abbe Amsterdam C on: 03/08/2023 01:20 AM   Modules accepted: Orders

## 2023-03-20 NOTE — Progress Notes (Unsigned)
[facility] [address], [address] [place], [place] [address] [phone]  Date:  03/21/2023   Name:  Theresa [name]   DOB:  [birth year]   MRN:  [id number]  PCP:  Pearline Cables, MD    Chief Complaint: No chief complaint on file.   History of Present Illness:  Theresa [name] is a 67 y.o. very pleasant female patient who presents with the following:  Patient seen today with concern of persistent cough- History of bipolar disorder and epilepsy, tardive dyskinesia, dyslipidemia -recently elevated blood pressures consistent with essential hypertension  Most recent visit with myself was a virtual visit in June-patient was seen virtually because I myself had COVID-19  At her last visit blood pressures were still running high, we increased her dose of losartan and eventually added HCTZ  Colon cancer screening Needs COVID booster this fall Can update DEXA scan She had elevated potassium last month, normal on recheck with mild hyponatremia which we see frequently for her  Patient Active Problem List   Diagnosis Date Noted   Breakthrough seizure (HCC) 06/15/2022   Hypokalemia 06/15/2022   Hyponatremia 06/15/2022   Elevated AST (SGOT) 06/15/2022   Tongue laceration 06/15/2022   Dyslipidemia 05/05/2020   Manic bipolar I disorder in partial remission (HCC) 07/25/2018   Posttraumatic stress disorder 07/25/2018   Generalized idiopathic epilepsy and epileptic syndromes, not intractable, without status epilepticus (HCC) 06/24/2017   Tardive dyskinesia 01/21/2017   Special screening for malignant neoplasms, colon 06/16/2012   Personal history of colonic polyps 06/16/2012   Bipolar disorder (HCC) 04/02/2012    Past Medical History:  Diagnosis Date   Adenomatous colon polyp    Allergy    Anxiety    Bipolar 1 disorder (HCC)    Blood transfusion without reported diagnosis    Depression    Elevated LFTs    GERD (gastroesophageal  reflux disease)    Hepatic steatosis 05/07/2013   Hyperlipidemia    Seizures (HCC) 1977   Last seizure 01/2020   Tardive dyskinesia     Past Surgical History:  Procedure Laterality Date   APPENDECTOMY     COLONOSCOPY  06/16/2012   Procedure: COLONOSCOPY;  Surgeon: Hart Carwin, MD;  Location: [facility];  Service: Endoscopy;  Laterality: N/A;   DILATION AND CURETTAGE OF UTERUS     KNEE ARTHROSCOPY WITH MENISCAL REPAIR Left    NECK SURGERY     plate with screws to 3 cervical spines    Social History   Tobacco Use   Smoking status: Never   Smokeless tobacco: Never  Vaping Use   Vaping status: Never Used  Substance Use Topics   Alcohol use: Yes    Comment: occasionaly 1 a month   Drug use: Never    Family History  Problem Relation Age of Onset   Colon polyps Mother    Cancer Mother    Heart disease Mother    Alcoholism Mother    Hypertension Mother    Colon polyps Father    Cancer Father    Alcoholism Father    Bipolar disorder Father    Drug abuse Brother    Bipolar disorder Other    Colon cancer Neg Hx    Stomach cancer Neg Hx    Breast cancer Neg Hx    Esophageal cancer Neg Hx    Rectal cancer Neg Hx     Allergies  Allergen Reactions   Cyclobenzaprine Other (  See Comments)    Blisters in mouth   Lamictal [Lamotrigine]     Acute renal failure   Lybalvi [Olanzapine-Samidorphan] Other (See Comments)    Nausea, dizziness   Ropinirole Nausea And Vomiting   Tramadol Other (See Comments)    Reaction??   Acetaminophen Itching, Swelling and Rash   Codeine Swelling, Rash and Other (See Comments)    Can take hydrocodone   Penicillins     Abdominal pain    Medication list has been reviewed and updated.  Current Outpatient Medications on File Prior to Visit  Medication Sig Dispense Refill   ALPRAZolam (XANAX XR) 2 MG 24 hr tablet TAKE 1 TABLET BY MOUTH IN THE  MORNING AND AT BEDTIME 180 tablet 1   ALPRAZolam (XANAX) 1 MG tablet TAKE 1 TABLET BY MOUTH  DAILY AS NEEDED FOR SEVERE ANXIETY 30 tablet 3   carbamazepine (CARBATROL) 300 MG 12 hr capsule Take 1 capsule (300 mg total) by mouth every morning AND 2 capsules (600 mg total) at bedtime. 270 capsule 1   cetirizine (ZYRTEC) 10 MG tablet Take 10 mg by mouth daily as needed for allergies or rhinitis.     Cholecalciferol (VITAMIN D3) 1.25 MG (50000 UT) CAPS Take 1 weekly for 12 weeks 12 capsule 0   clonazePAM (KLONOPIN) 1 MG tablet TAKE 1 TABLET BY MOUTH AT BEDTIME. 30 tablet 2   Deutetrabenazine (AUSTEDO) 12 MG TABS Take 1 tablet po BID with a 9 mg tablet to equal 21 mg po BID 60 tablet 5   Deutetrabenazine (AUSTEDO) 9 MG TABS Take 1 tab po BID with a 12 mg tab to equal 21 mg BID 60 tablet 5   docusate sodium (COLACE) 50 MG capsule Take 50 mg by mouth daily as needed for mild constipation.     fluticasone (FLONASE) 50 MCG/ACT nasal spray Place 1 spray into both nostrils daily as needed for allergies or rhinitis.     levETIRAcetam (KEPPRA) 500 MG tablet Take 1 tablet (500 mg total) by mouth 2 (two) times daily. 200 tablet 3   losartan-hydrochlorothiazide (HYZAAR) 50-12.5 MG tablet Take 1 tablet by mouth daily. 90 tablet 3   lovastatin (MEVACOR) 20 MG tablet Take 1 tablet (20 mg total) by mouth daily. 90 tablet 3   magic mouthwash w/lidocaine SOLN Diphenhydramine 12.5 mg/5 mL 1 part Viscous lidocaine 2% 1 part Maalox 1 part  Swish, hold, and spit or swallow 30 mL TID 900 mL 1   Melatonin 10 MG TABS Take 20 mg by mouth at bedtime.     metFORMIN (GLUCOPHAGE) 500 MG tablet Take 1 tablet (500 mg total) by mouth 2 (two) times daily with a meal. 180 tablet 2   Multiple Vitamins-Minerals (CENTRUM SILVER PO) Take 1 tablet by mouth 2 (two) times a week.     OLANZapine (ZYPREXA) 7.5 MG tablet Take 1 tablet (7.5 mg total) by mouth at bedtime. (Patient not taking: Reported on 02/07/2023)     pantoprazole (PROTONIX) 40 MG tablet Take 1 tablet (40 mg total) by mouth daily as needed. 90 tablet 3   polyethylene  glycol (MIRALAX / GLYCOLAX) 17 g packet Take 17 g by mouth daily as needed for mild constipation.     tiZANidine (ZANAFLEX) 2 MG tablet Take 1 tablet (2 mg total) by mouth every 6 (six) hours as needed for muscle spasms. (Patient not taking: Reported on 02/07/2023) 30 tablet 1   traZODone (DESYREL) 100 MG tablet Take 1-2 tablets (100-200 mg total) by mouth at bedtime  as needed. for sleep 180 tablet 3   zolpidem (AMBIEN) 10 MG tablet Take 1 tablet (10 mg total) by mouth at bedtime as needed. 30 tablet 5   No current facility-administered medications on file prior to visit.    Review of Systems:  As per HPI- otherwise negative.   Physical Examination: There were no vitals filed for this visit. There were no vitals filed for this visit. There is no height or weight on file to calculate BMI. Ideal Body Weight:    GEN: no acute distress. HEENT: Atraumatic, Normocephalic.  Ears and Nose: No external deformity. CV: RRR, No M/G/R. No JVD. No thrill. No extra heart sounds. PULM: CTA B, no wheezes, crackles, rhonchi. No retractions. No resp. distress. No accessory muscle use. ABD: S, NT, ND, +BS. No rebound. No HSM. EXTR: No c/c/e PSYCH: Normally interactive. Conversant.    Assessment and Plan: ***  Signed Abbe Amsterdam, MD

## 2023-03-21 ENCOUNTER — Ambulatory Visit: Payer: Medicare Other | Admitting: Family Medicine

## 2023-03-21 VITALS — BP 124/80 | HR 81 | Temp 98.2°F | Resp 18 | Ht 66.0 in | Wt 182.8 lb

## 2023-03-21 DIAGNOSIS — R0982 Postnasal drip: Secondary | ICD-10-CM | POA: Diagnosis not present

## 2023-03-21 DIAGNOSIS — R053 Chronic cough: Secondary | ICD-10-CM

## 2023-03-21 DIAGNOSIS — I10 Essential (primary) hypertension: Secondary | ICD-10-CM

## 2023-03-21 DIAGNOSIS — E785 Hyperlipidemia, unspecified: Secondary | ICD-10-CM

## 2023-03-21 MED ORDER — IPRATROPIUM BROMIDE 0.06 % NA SOLN
2.0000 | Freq: Three times a day (TID) | NASAL | 12 refills | Status: DC
Start: 2023-03-21 — End: 2024-07-05

## 2023-03-21 NOTE — Patient Instructions (Signed)
It was great to see you again today  I am sorry you are dealing with this cough, as we discussed there are many things that can cause a chronic mild cough  Lets have you try the Atrovent nasal spray for potential postnasal drainage.  You can use this 2 or 3 times a day; try to use on a scheduled basis for a couple of weeks and see if it helps  Please let me know what you think, if this does not work we will try another avenue  Please see me in about 6 months for your physical

## 2023-03-24 ENCOUNTER — Other Ambulatory Visit: Payer: Self-pay | Admitting: Psychiatry

## 2023-03-24 DIAGNOSIS — F4310 Post-traumatic stress disorder, unspecified: Secondary | ICD-10-CM

## 2023-03-26 ENCOUNTER — Encounter: Payer: Self-pay | Admitting: Family Medicine

## 2023-03-28 ENCOUNTER — Ambulatory Visit: Payer: Medicare Other | Admitting: Psychiatry

## 2023-03-28 DIAGNOSIS — F319 Bipolar disorder, unspecified: Secondary | ICD-10-CM | POA: Diagnosis not present

## 2023-03-28 NOTE — Progress Notes (Signed)
[facility] Counselor/Therapist Progress Note  Patient ID: Theresa [name], MRN: [id number],    Date: 03/28/2023  Time Spent: 53 minutes   Treatment Type: Individual Therapy  Reported Symptoms:  states she is doing some better, Less anxiety, "no ptsd feelings", some issues with tardive dyskinesia (sometimes Xanax helps but didn't take any today, sees her med provider tomorrow)   Mental Status Exam:  Appearance:   Casual     Behavior:  Appropriate, Sharing, and Motivated  Motor:  Normal  Speech/Language:   Clear and Coherent  Affect:  Some anxiety "but better"  Mood:  Some anxiety  Thought process:  goal directed  Thought content:    WNL  Sensory/Perceptual disturbances:    WNL  Orientation:  oriented to person, place, time/date, situation, day of week, month of year, year, and stated date of Aug. 5, 2024  Attention:  Good  Concentration:  Good  Memory:  Does have memory issues "that affect my executive functioning"   Fund of knowledge:   Fair  Insight:    Good  Judgment:   Good  Impulse Control:  Good   Risk Assessment: Danger to Self:  No Self-injurious Behavior: No Danger to Others: No Duty to Warn:no Physical Aggression / Violence:No  Access to Firearms a concern: No  Gang Involvement:No   Subjective:  Patient in session today reporting anxiety "but it is better" and "I feel better, I feel calmer". Noticeably less anxious. Reports some "occasional bipolar ups and downs but nothing extreme at all." Has enjoyed watching the Olympics recently  and "my sleep pattern off some." [facility] with her last session, and reports enjoying activity twice per week there especially the water aerobics classes and her husband works out in their [employer]. Thinking some more in the past especially as she shares today hurtful comment mother made "over 30 yrs ago, it doesn't bother me as much as it used to, but it still pops  up every now and then" and the comment was that my mom "wished that it was me that died instead of my brother who died over 6yrs ago due to overdose". Processed some of the hurtful feelings about mom, and "finally reached a point where I realized I couldn't really help her with her issues of alcoholism." Reports being "better over all re: my mom but it still pops up every now and then especially when I think of negatives from the past, but not happening as often now."  Micah Flesher out to lunch with a friend last week and enjoyed it.   Interventions: Cognitive Behavioral Therapy and Ego-Supportive  Long term goal: Reduce overall level, frequency, and intensity of the anxiety so that daily functioning is not impaired. Short-term goal: Increase understanding of the beliefs and messages that produce anxiety, depression, and worry. Strategies: Identify , challenge, and replace anxious/negative/depressive self-talk with positive, realistic, and empowering self talk.   Diagnosis:   ICD-10-CM   1. Bipolar 1 disorder (HCC)  F31.9      Plan:  Patient in for session today sharing that she has been doing better recently with much fewer bipolar symptoms. Sleep has been good except about once per week I might have trouble sleeping. Using the [place] diet as she and husband are trying to lose some weight. Good contact with friends, including "a lot of emails." Is making progress with her goals, and needs to continue working with goal-directed behaviors to move  in a positive direction. Reminded and encouraged patient and her use of more positive and self affirming thoughts and behaviors as noted in session including: Focusing more on the things she can change rather than cannot change, believing more in herself and her ability to make positive changes and maintain them, remain in touch with supportive people, participate more in activities that bring her pleasure including spending time with her  pets/grandchildren/hobbies, good boundaries with others, spending time outside each day, positive self talk, and realize the strengths she shows when working with goal-directed behaviors to move in a direction that supports her improved emotional health and overall wellbeing.  Goal review and progress/challenges noted with patient.  Next appointment within 3 to 4 weeks.   Mathis Fare, LCSW

## 2023-03-29 ENCOUNTER — Ambulatory Visit: Payer: Medicare Other | Admitting: Psychiatry

## 2023-03-29 ENCOUNTER — Encounter: Payer: Self-pay | Admitting: Psychiatry

## 2023-03-29 DIAGNOSIS — G2401 Drug induced subacute dyskinesia: Secondary | ICD-10-CM | POA: Diagnosis not present

## 2023-03-29 DIAGNOSIS — R635 Abnormal weight gain: Secondary | ICD-10-CM

## 2023-03-29 DIAGNOSIS — F319 Bipolar disorder, unspecified: Secondary | ICD-10-CM | POA: Diagnosis not present

## 2023-03-29 DIAGNOSIS — F4310 Post-traumatic stress disorder, unspecified: Secondary | ICD-10-CM

## 2023-03-29 DIAGNOSIS — T50905A Adverse effect of unspecified drugs, medicaments and biological substances, initial encounter: Secondary | ICD-10-CM

## 2023-03-29 MED ORDER — METFORMIN HCL 500 MG PO TABS
ORAL_TABLET | ORAL | Status: DC
Start: 2023-03-29 — End: 2023-06-06

## 2023-03-29 NOTE — Progress Notes (Signed)
Theresa [name] [id number] [birth year] 67 y.o.  Subjective:   Patient ID:  Theresa [name] is a 67 y.o. (DOB [birth year]) female.  Chief Complaint:  Chief Complaint  Patient presents with   Follow-up    Bipolar Disorder, Anxiety, and insomnia    HPI Theresa [name] presents to the office today for follow-up of Bipolar Disorder, Anxiety, and insomnia.   She reports that she tried taking Metformin 2 tabs twice daily for 3 days and reports that she tolerated this without difficulty. She asks if she can increase Metformin to further help with antipsychotic induced weight gain. Denies any change in appetite. She reports that her weight has been stable overall.   She reports that she feels sleepy during the day. Husband reports that she is sleepy during the day. She reports that most nights she is sleeping well. She reports that she has been taking Olanzapine 10 mg at bedtime and forgot about decreasing to 7.5 mg at bedtime. She reports that her mood has been "pretty even." She denies any significant depression.   She reports that she has word finding difficulties. She reports that she has difficulty remembering things. She reports difficulty sometimes remembering what occurred the day before. Husband reports that he often needs to remind her of things. She reports concentration is ok. She reports that she is starting to read gain since her vision has improved. Denies SI.   They report that she has anxiety about being late and wants to leave early.   She reports that TD is severe at times and notices some relief after taking Xanax. She reports, "I've been working on my anxiety and I haven't been having as much anxiety around people."   She has been taking water aerobics twice a week and enjoying this.  She reports that she had a low Vitamin D level and has started Vitamin D.   Xanax 1 mg filled 03/28/23.  Klonopin last filled 03/12/23 x2 Ambien last filled 03/09/23 x 2.   Past Psychiatric  Medication Trials: Olanzapine Seroquel Saphris Depakote-self-injurious behavior Lamictal Keppra Carbamazepine Xanax Klonopin Ambien Trazodone Benztropine Ingrezza Propranolol- Ineffective  AIMS    Flowsheet Row Office Visit from 03/29/2023 in [facility] Office Visit from 01/31/2023 in [facility] Office Visit from 09/30/2022 in [facility] Office Visit from 05/20/2022 in [facility] Office Visit from 10/13/2021 in [facility]  AIMS Total Score 10 10 13 8 6       Mini-Mental    Flowsheet Row Office Visit from 02/28/2017 in [facility]  Total Score (max 30 points ) 29      [id number]    Flowsheet Row Clinical Support from 05/27/2022 in [facility] at [facility] Office Visit from 05/17/2022 in [facility] at [facility] Office Visit from 02/28/2017 in [facility] at [facility] Office Visit from 09/19/2015 in Primary Care at [facility] Visit from 06/05/2015 in Primary Care at [facility] Total Score 0 1 0 0 0        Review of Systems:  Review of Systems  Respiratory:  Positive for cough.   Musculoskeletal:  Negative for gait problem.  Neurological:  Negative for tremors.       She questions if she may have had some nocturnal seizures recently.   Psychiatric/Behavioral:  Please refer to HPI    Medications: I have reviewed the patient's current medications.  Current Outpatient Medications  Medication Sig Dispense Refill   Cholecalciferol (VITAMIN D3) 1.25 MG (50000 UT) CAPS Take 1 weekly for 12 weeks 12 capsule 0   losartan-hydrochlorothiazide (HYZAAR) 50-12.5 MG tablet Take 1 tablet by mouth daily. 90 tablet 3   OLANZapine (ZYPREXA) 7.5 MG tablet Take 1 tablet (7.5 mg total) by mouth at bedtime.  (Patient taking differently: Take 10 mg by mouth at bedtime.)     ALPRAZolam (XANAX XR) 2 MG 24 hr tablet TAKE 1 TABLET BY MOUTH IN THE  MORNING AND AT BEDTIME 180 tablet 1   ALPRAZolam (XANAX) 1 MG tablet TAKE 1 TABLET BY MOUTH DAILY AS NEEDED FOR SEVERE ANXIETY 30 tablet 0   carbamazepine (CARBATROL) 300 MG 12 hr capsule Take 1 capsule (300 mg total) by mouth every morning AND 2 capsules (600 mg total) at bedtime. 270 capsule 1   cetirizine (ZYRTEC) 10 MG tablet Take 10 mg by mouth daily as needed for allergies or rhinitis.     clonazePAM (KLONOPIN) 1 MG tablet TAKE 1 TABLET BY MOUTH AT BEDTIME. 30 tablet 2   Deutetrabenazine (AUSTEDO) 12 MG TABS Take 1 tablet po BID with a 9 mg tablet to equal 21 mg po BID 60 tablet 5   Deutetrabenazine (AUSTEDO) 9 MG TABS Take 1 tab po BID with a 12 mg tab to equal 21 mg BID 60 tablet 5   docusate sodium (COLACE) 50 MG capsule Take 50 mg by mouth daily as needed for mild constipation.     fluticasone (FLONASE) 50 MCG/ACT nasal spray Place 1 spray into both nostrils daily as needed for allergies or rhinitis.     ipratropium (ATROVENT) 0.06 % nasal spray Place 2 sprays into both nostrils 3 (three) times daily. Use as needed for PND 15 mL 12   levETIRAcetam (KEPPRA) 500 MG tablet Take 1 tablet (500 mg total) by mouth 2 (two) times daily. 200 tablet 3   lovastatin (MEVACOR) 20 MG tablet Take 1 tablet (20 mg total) by mouth daily. 90 tablet 3   magic mouthwash w/lidocaine SOLN Diphenhydramine 12.5 mg/5 mL 1 part Viscous lidocaine 2% 1 part Maalox 1 part  Swish, hold, and spit or swallow 30 mL TID 900 mL 1   Melatonin 10 MG TABS Take 20 mg by mouth at bedtime.     metFORMIN (GLUCOPHAGE) 500 MG tablet Take 1-2 tablets twice daily     Multiple Vitamins-Minerals (CENTRUM SILVER PO) Take 1 tablet by mouth 2 (two) times a week.     pantoprazole (PROTONIX) 40 MG tablet Take 1 tablet (40 mg total) by mouth daily as needed. 90 tablet 3   traZODone (DESYREL) 100 MG tablet  Take 1-2 tablets (100-200 mg total) by mouth at bedtime as needed. for sleep 180 tablet 3   zolpidem (AMBIEN) 10 MG tablet Take 1 tablet (10 mg total) by mouth at bedtime as needed. 30 tablet 5   No current facility-administered medications for this visit.    Medication Side Effects: Other: Drowsiness  Allergies:  Allergies  Allergen Reactions   Cyclobenzaprine Other (See Comments)    Blisters in mouth   Lamictal [Lamotrigine]     Acute renal failure   Lybalvi [Olanzapine-Samidorphan] Other (See Comments)    Nausea, dizziness   Ropinirole Nausea And Vomiting   Tramadol Other (See Comments)    Reaction??   Acetaminophen Itching, Swelling and Rash   Codeine Swelling,  Rash and Other (See Comments)    Can take hydrocodone   Penicillins     Abdominal pain    Past Medical History:  Diagnosis Date   Adenomatous colon polyp    Allergy    Anxiety    Bipolar 1 disorder (HCC)    Blood transfusion without reported diagnosis    Depression    Elevated LFTs    GERD (gastroesophageal reflux disease)    Hepatic steatosis 05/07/2013   Hyperlipidemia    Seizures (HCC) 1977   Last seizure 01/2020   Tardive dyskinesia     Past Medical History, Surgical history, Social history, and Family history were reviewed and updated as appropriate.   Please see review of systems for further details on the patient's review from today.   Objective:   Physical Exam:  There were no vitals taken for this visit.  Physical Exam Vitals reviewed.  Constitutional:      General: She is not in acute distress. Musculoskeletal:        General: No deformity.  Neurological:     Mental Status: She is alert and oriented to person, place, and time.     Coordination: Coordination normal.  Psychiatric:        Attention and Perception: Attention and perception normal. She does not perceive auditory or visual hallucinations.        Mood and Affect: Affect is not labile, blunt, angry or inappropriate.         Speech: Speech normal.        Behavior: Behavior normal.        Thought Content: Thought content normal. Thought content is not paranoid or delusional. Thought content does not include homicidal or suicidal ideation. Thought content does not include homicidal or suicidal plan.        Cognition and Memory: Cognition normal. Memory is impaired.        Judgment: Judgment normal.     Comments: Insight intact Mood is mildly anxious     Lab Review:     Component Value Date/Time   NA 131 (L) 02/21/2023 1056   K 4.4 02/21/2023 1056   CL 90 (L) 02/21/2023 1056   CO2 30 02/21/2023 1056   GLUCOSE 111 (H) 02/21/2023 1056   BUN 11 02/21/2023 1056   CREATININE 0.73 02/21/2023 1056   CREATININE 0.79 05/08/2020 1015   CALCIUM 9.8 02/21/2023 1056   PROT 7.1 02/07/2023 1141   ALBUMIN 4.6 02/07/2023 1141   AST 19 02/07/2023 1141   ALT 24 02/07/2023 1141   ALKPHOS 107 02/07/2023 1141   BILITOT 0.4 02/07/2023 1141   GFRNONAA >60 06/15/2022 0457       Component Value Date/Time   WBC 4.6 02/07/2023 1141   RBC 4.63 02/07/2023 1141   HGB 13.3 02/07/2023 1141   HCT 40.5 02/07/2023 1141   PLT 240.0 02/07/2023 1141   MCV 87.4 02/07/2023 1141   MCV 87.7 02/07/2013 1110   MCH 29.1 06/15/2022 0457   MCHC 32.8 02/07/2023 1141   RDW 14.1 02/07/2023 1141   LYMPHSABS 1.1 06/14/2022 1229   MONOABS 1.2 (H) 06/14/2022 1229   EOSABS 0.0 06/14/2022 1229   BASOSABS 0.0 06/14/2022 1229    No results found for: "POCLITH", "LITHIUM"   Lab Results  Component Value Date   CBMZ 5.2 02/01/2019     .res Assessment: Plan:   Will decrease Olanzapine to 7.5 mg po at bedtime since 10 mg dose may be causing increased drowsiness at this time and pt  has not had any recent manic symptoms.  Discussed that pt could try to increase Metformin up to 1000 mg twice daily as tolerated to possibly improve antipsychotic induced weight gain. Discussed that 500 mg twice daily has been the dose used in studies regarding  antipsychotic induced weight gain and that higher doses may not have increased benefit. Discussed that script could be changed to 1000 mg tabs if pt tolerates 1000 mg BID.  Will continue Xanax XR 2 mg twice daily for anxiety.  Continue Xanax 1 mg as needed for panic.  Continue Austedo 21 mg twice daily for TD.  Continue Carbamazepine 300 mg in the morning and 600 mg at bedtime for mood stabilization.  Continue Trazodone as needed for insomnia.  Continue Ambien 10 mg at bedtime for insomnia.  Continue Klonopin 1 mg po at bedtime for insomnia and anxiety.   Theresa "[name]" was seen today for follow-up.  Diagnoses and all orders for this visit:  Bipolar 1 disorder (HCC)  Weight gain due to medication Comments: Antipsychotic induced weight gain Orders: -     metFORMIN (GLUCOPHAGE) 500 MG tablet; Take 1-2 tablets twice daily  Posttraumatic stress disorder  Tardive dyskinesia     Please see After Visit Summary for patient specific instructions.  Future Appointments  Date Time Provider Department Center  05/02/2023 11:00 AM Mathis Fare, LCSW CP-[facility] None  06/06/2023 11:00 AM Corie Chiquito, PMHNP [facility] None  09/26/2023 10:00 AM Van Clines, MD [facility] None  09/26/2023  1:00 PM Copland, [name], MD [facility]    No orders of the defined types were placed in this encounter.   -------------------------------

## 2023-04-04 ENCOUNTER — Encounter: Payer: Self-pay | Admitting: Family Medicine

## 2023-04-04 DIAGNOSIS — R053 Chronic cough: Secondary | ICD-10-CM

## 2023-04-04 NOTE — Addendum Note (Signed)
Addended by: Pearline Cables on: 04/04/2023 08:34 PM   Modules accepted: Orders

## 2023-04-06 ENCOUNTER — Encounter: Payer: Self-pay | Admitting: Family Medicine

## 2023-04-06 ENCOUNTER — Ambulatory Visit (HOSPITAL_BASED_OUTPATIENT_CLINIC_OR_DEPARTMENT_OTHER)
Admission: RE | Admit: 2023-04-06 | Discharge: 2023-04-06 | Disposition: A | Discharge status: Home / Self Care | Payer: Medicare Other | Source: Ambulatory Visit | Attending: Family Medicine | Admitting: Family Medicine

## 2023-04-06 DIAGNOSIS — J984 Other disorders of lung: Secondary | ICD-10-CM | POA: Diagnosis not present

## 2023-04-06 DIAGNOSIS — R053 Chronic cough: Secondary | ICD-10-CM | POA: Insufficient documentation

## 2023-04-06 DIAGNOSIS — Z981 Arthrodesis status: Secondary | ICD-10-CM | POA: Diagnosis not present

## 2023-04-08 ENCOUNTER — Telehealth: Payer: Self-pay | Admitting: *Deleted

## 2023-04-08 NOTE — Telephone Encounter (Signed)
===  View-only below this line=== ----- Message ----- From: Napoleon Form, MD Sent: 04/08/2023   4:32 PM EDT To: Marlowe Kays, CMA; Pearline Cables, MD; *  Yes, we will bring her in for a visit soon either with me or APP.  Thank you ----- Message ----- From: Pearline Cables, MD Sent: 04/04/2023   8:34 PM EDT To: Napoleon Form, MD  Hi Kavitha-you may remember this nice lady, she had a seizure after her colonoscopy prep and you guys had to rush her to [facility] never made it to the colonoscopy you had planned unfortunately!  She is doing fine, I think she may have some chronic GERD causing a cough.  Already on a PPI  Could you ask your staff to get her scheduled for a visit with you at your convenience?  We were not sure if she could just call and schedule because she has not actually seen a GI provider yet  Thanks so much! Jess

## 2023-04-11 NOTE — Telephone Encounter (Signed)
Dr Angus Palms patient is scheduled for an appointment already to see you in [place]. Is this ok ? Or does she need to see an APP sooner ?

## 2023-04-11 NOTE — Telephone Encounter (Signed)
Ok, we can try to get her in sooner if we have any cancellations. Thanks

## 2023-04-11 NOTE — Telephone Encounter (Signed)
Noted will keep an eye out for any cancellations I also put her on the wait list

## 2023-04-17 ENCOUNTER — Encounter: Payer: Self-pay | Admitting: Family Medicine

## 2023-04-21 ENCOUNTER — Telehealth: Payer: Self-pay | Admitting: Psychiatry

## 2023-04-21 DIAGNOSIS — F4310 Post-traumatic stress disorder, unspecified: Secondary | ICD-10-CM

## 2023-04-21 DIAGNOSIS — F5101 Primary insomnia: Secondary | ICD-10-CM

## 2023-04-21 NOTE — Telephone Encounter (Signed)
Next visit is 06/06/23. She has not been sleeping well on the Olanzapine 7.5 mg. Can she get the 10 mg instead of the 7.6?  [id number] [place], [place] - [address]   Phone: [phone]  Fax: [phone]

## 2023-04-21 NOTE — Telephone Encounter (Signed)
Patient asking to increase olanzapine back to 10 mg.  She reports being more manic currently and that is when you usually increase it.

## 2023-04-21 NOTE — Telephone Encounter (Signed)
From last note:  Will decrease Olanzapine to 7.5 mg po at bedtime since 10 mg dose may be causing increased drowsiness at this time and pt has not had any recent manic symptoms.

## 2023-04-22 MED ORDER — OLANZAPINE 10 MG PO TABS
10.0000 mg | ORAL_TABLET | Freq: Every day | ORAL | 1 refills | Status: DC
Start: 2023-04-22 — End: 2023-06-06

## 2023-04-22 NOTE — Telephone Encounter (Signed)
Please let her know script has been sent for Olanzapine 10 mg.

## 2023-04-22 NOTE — Telephone Encounter (Signed)
Patient notified

## 2023-05-01 ENCOUNTER — Other Ambulatory Visit: Payer: Self-pay | Admitting: Family Medicine

## 2023-05-01 DIAGNOSIS — E559 Vitamin D deficiency, unspecified: Secondary | ICD-10-CM

## 2023-05-02 ENCOUNTER — Ambulatory Visit: Payer: Medicare Other | Admitting: Psychiatry

## 2023-05-02 ENCOUNTER — Encounter: Payer: Self-pay | Admitting: Family Medicine

## 2023-05-05 ENCOUNTER — Other Ambulatory Visit: Payer: Self-pay | Admitting: Psychiatry

## 2023-05-05 DIAGNOSIS — F5101 Primary insomnia: Secondary | ICD-10-CM

## 2023-05-05 DIAGNOSIS — F4310 Post-traumatic stress disorder, unspecified: Secondary | ICD-10-CM

## 2023-05-11 ENCOUNTER — Other Ambulatory Visit: Payer: Self-pay | Admitting: Psychiatry

## 2023-05-11 DIAGNOSIS — F4310 Post-traumatic stress disorder, unspecified: Secondary | ICD-10-CM

## 2023-05-12 ENCOUNTER — Other Ambulatory Visit: Payer: Self-pay

## 2023-05-12 ENCOUNTER — Telehealth: Payer: Self-pay | Admitting: Psychiatry

## 2023-05-12 DIAGNOSIS — F4310 Post-traumatic stress disorder, unspecified: Secondary | ICD-10-CM

## 2023-05-12 NOTE — Telephone Encounter (Deleted)
It is too early. Last filled 8/24, due 9/21.

## 2023-05-12 NOTE — Telephone Encounter (Signed)
Pt called to see why we denied her Xanax 1.0mg  RF? Please call.

## 2023-05-12 NOTE — Telephone Encounter (Signed)
Pended.

## 2023-05-13 MED ORDER — ALPRAZOLAM 1 MG PO TABS
ORAL_TABLET | ORAL | 0 refills | Status: DC
Start: 2023-05-13 — End: 2023-06-06

## 2023-05-14 ENCOUNTER — Other Ambulatory Visit: Payer: Self-pay | Admitting: Psychiatry

## 2023-05-14 DIAGNOSIS — F319 Bipolar disorder, unspecified: Secondary | ICD-10-CM

## 2023-05-14 DIAGNOSIS — F5101 Primary insomnia: Secondary | ICD-10-CM

## 2023-05-14 DIAGNOSIS — F4310 Post-traumatic stress disorder, unspecified: Secondary | ICD-10-CM

## 2023-05-23 ENCOUNTER — Ambulatory Visit: Payer: Medicare Other | Admitting: Psychiatry

## 2023-05-23 ENCOUNTER — Other Ambulatory Visit: Payer: Self-pay | Admitting: Family Medicine

## 2023-05-23 DIAGNOSIS — F319 Bipolar disorder, unspecified: Secondary | ICD-10-CM | POA: Diagnosis not present

## 2023-05-23 DIAGNOSIS — Z1231 Encounter for screening mammogram for malignant neoplasm of breast: Secondary | ICD-10-CM

## 2023-05-23 NOTE — Progress Notes (Signed)
[facility] Counselor/Therapist Progress Note  Patient ID: GHAZAL PEVEY, MRN: [id number],    Date: 05/23/2023  Time Spent: 50 minutes   Treatment Type: Individual Therapy  Reported Symptoms: less anxiety, more active with more people, "and I just feel better and haven't had any emergencies, haven't been depressed, working with her tardive dyskinesia along with her med provider  Mental Status Exam:  Appearance:   Casual     Behavior:  Appropriate, Sharing, and Motivated  Motor:  Normal  Speech/Language:   Clear and Coherent  Affect:  anxious  Mood:  anxious  Thought process:  goal directed  Thought content:    Sometimes obsessive  Sensory/Perceptual disturbances:    WNL  Orientation:  oriented to person, place, time/date, situation, day of week, month of year, year, and stated date of Sept. 30, 2024  Attention:  Good  Concentration:  Good  Memory:  "Some forgetting"  Fund of knowledge:   Good and Fair  Insight:    Good and Fair  Judgment:   Good  Impulse Control:  Good   Risk Assessment: Danger to Self:  No Self-injurious Behavior: No Danger to Others: No Duty to Warn:no Physical Aggression / Violence:No  Access to Firearms a concern: No  Gang Involvement:No   Subjective:  Patient in session and reporting anxiety has decreased further and this was confirmed with her husband. Feels that she has made progress on goals, feeling reduction in anxiety and mood instability and is managing anxiety better, continues to feel calmer. Continues involvement in my referral of her to [facility] and finding the group activities fun and helpful. Patient stated she feels interactions there are helping her physically and emotionally. States there is more distance between her and issues with mom in the past and is "not feeling affected by them at this point." Spoke with husband who brought her to session today and he confirms patient's more positive report.Encouraged  patient's involvement at [facility] and in reaching out to other friends and making connections. Feeling that she has "done better and wondered about not continuing therapy right now as she doesn't feel she needs it due to progress made. Stated if she began to feel  more unstable and that she needed to return "then I certainly would return for therapy."  Commits to remaining on her prescribed medication with her med provider and will call if/whey needed to return to therapy. Husband also supporting this decision.  Interventions: Cognitive Behavioral Therapy and Ego-Supportive  Long term goal: Reduce overall level, frequency, and intensity of the anxiety so that daily functioning is not impaired. Short-term goal: Increase understanding of the beliefs and messages that produce anxiety, depression, and worry. Strategies: Identify , challenge, and replace anxious/negative/depressive self-talk with positive, realistic, and empowering self talk.  Diagnosis:   ICD-10-CM   1. Bipolar 1 disorder (HCC)  F31.9      Plan: Patient today sharing progress and stabilization she has been achieving. Not scheduling for therapy right now as noted above, based on her progress, and report of patient and husband. Will call if/when needed for therapy in the future. Encouraged patient in her use of more positive and self affirming thoughts and behaviors as noted in sessions including: Believing more in herself and making positive changes, focus more on the things she can change versus cannot, use of more positive and self affirming thoughts and behaviors, stay in touch with supportive people, participate more in activities that bring her  pleasure including spending time with her pets/grandchildren/hobbies, healthy boundaries with others, positive self talk, and recognize the strength she shows when working with goal-directed behaviors to move in a direction that supports her improved emotional health and outlook into the  future.  Goal review and progress/challenges noted with patient.  Not re-scheduling at this time due to progress made, as noted above.   Mathis Fare, LCSW

## 2023-05-24 ENCOUNTER — Encounter: Payer: Self-pay | Admitting: Physician Assistant

## 2023-05-24 ENCOUNTER — Encounter: Payer: Self-pay | Admitting: Family Medicine

## 2023-05-24 ENCOUNTER — Ambulatory Visit: Payer: Medicare Other | Admitting: Physician Assistant

## 2023-05-24 VITALS — BP 150/98 | HR 84 | Ht 66.0 in | Wt 182.2 lb

## 2023-05-24 DIAGNOSIS — K219 Gastro-esophageal reflux disease without esophagitis: Secondary | ICD-10-CM | POA: Diagnosis not present

## 2023-05-24 DIAGNOSIS — R059 Cough, unspecified: Secondary | ICD-10-CM

## 2023-05-24 DIAGNOSIS — G40309 Generalized idiopathic epilepsy and epileptic syndromes, not intractable, without status epilepticus: Secondary | ICD-10-CM

## 2023-05-24 DIAGNOSIS — Z1211 Encounter for screening for malignant neoplasm of colon: Secondary | ICD-10-CM | POA: Diagnosis not present

## 2023-05-24 MED ORDER — FAMOTIDINE 40 MG PO TABS: 40.0000 mg | ORAL_TABLET | Freq: Every day | ORAL | 5 refills | Status: DC

## 2023-05-24 NOTE — Patient Instructions (Signed)
We have sent the following medications to your pharmacy for you to pick up at your convenience: Pepcid  You will be contacted by [facility] Scheduling in the next 2 days to arrange a Barium Swallow with Tablet.  The number on your caller ID will be [phone], please answer when they call.  If you have not heard from them in 2 days please call [phone] to schedule.    Due to recent changes in healthcare laws, you may see the results of your imaging and laboratory studies on MyChart before your provider has had a chance to review them.  We understand that in some cases there may be results that are confusing or concerning to you. Not all laboratory results come back in the same time frame and the provider may be waiting for multiple results in order to interpret others.  Please give [place] 48 hours in order for your provider to thoroughly review all the results before contacting the office for clarification of your results.    _______________________________________________________  If your blood pressure at your visit was 140/90 or greater, please contact your primary care physician to follow up on this.  _______________________________________________________  If you are age 67 or older, your body mass index should be between 23-30. Your Body mass index is 29.41 kg/m. If this is out of the aforementioned range listed, please consider follow up with your Primary Care Provider.  If you are age 73 or younger, your body mass index should be between 19-25. Your Body mass index is 29.41 kg/m. If this is out of the aformentioned range listed, please consider follow up with your Primary Care Provider.   ________________________________________________________  The [place] [facility] providers would like to encourage you to use [facility] to communicate with providers for non-urgent requests or questions.  Due to long hold times on the telephone, sending your provider a message by [facility] may be a  faster and more efficient way to get a response.  Please allow 48 business hours for a response.  Please remember that this is for non-urgent requests.  _______________________________________________________   I appreciate the  opportunity to care for you  Thank You   Jacelyn Grip

## 2023-05-24 NOTE — Progress Notes (Signed)
Chief Complaint: Cough  HPI:    Theresa [name] is a 67 y/o female with a past medical history as listed below including bipolar disorder and seizure disorder, known to Dr. Lavon Paganini, who was referred to me by Copland, Theresa Found, MD for a complaint of chronic cough.      06/16/2012 colonoscopy normal.  Repeat recommended 10 years.    06/14/2022 patient presented to the [facility] for a colonoscopy but had a generalized seizure and was sent to the ER at [facility].    07/01/2022 Cologuard negative.    Today, the patient presents to clinic accompanied by her husband who does assist with her history.  He tells me that she has had a chronic cough for many years which seemed to increase recently, they initially thought this may be related to allergies and started her on Zyrtec, but it did not really change, finally went to see PCP about a month or 2 ago and her Pantoprazole was increased to 40 mg twice daily, this helped maybe a little bit but she continues to cough.  Also has some issues where she swallows and it "goes down the wrong hole", as well as regurg.  They are still uncertain if symptoms are related to allergies or if it is her reflux.  Otherwise patient feels fine.    She discusses today that the problem with the colonoscopy was a bowel prep, apparently had episodes of vomiting and did not take her regular scheduled medicines that night or that morning.    Denies fever, chills, weight loss, blood in her stool, nausea or vomiting.  Past Medical History:  Diagnosis Date   Adenomatous colon polyp    Allergy    Anxiety    Bipolar 1 disorder (HCC)    Blood transfusion without reported diagnosis    Depression    Elevated LFTs    GERD (gastroesophageal reflux disease)    Hepatic steatosis 05/07/2013   Hyperlipidemia    Seizures (HCC) 1977   last seizure 05/2022   Tardive dyskinesia     Past Surgical History:  Procedure Laterality Date   APPENDECTOMY     COLONOSCOPY  06/16/2012   Procedure:  COLONOSCOPY;  Surgeon: Hart Carwin, MD;  Location: [facility];  Service: Endoscopy;  Laterality: N/A;   DILATION AND CURETTAGE OF UTERUS     KNEE ARTHROSCOPY WITH MENISCAL REPAIR Left    NECK SURGERY     plate with screws to 3 cervical spines    Current Outpatient Medications  Medication Sig Dispense Refill   ALPRAZolam (XANAX XR) 2 MG 24 hr tablet TAKE 1 TABLET BY MOUTH IN THE  MORNING AND AT BEDTIME 180 tablet 1   ALPRAZolam (XANAX) 1 MG tablet TAKE 1 TABLET BY MOUTH DAILY AS  NEEDED FOR SEVERE ANXIETY 30 tablet 0   carbamazepine (CARBATROL) 300 MG 12 hr capsule Take 1 capsule (300 mg total) by mouth every morning AND 2 capsules (600 mg total) at bedtime. 270 capsule 1   cetirizine (ZYRTEC) 10 MG tablet Take 10 mg by mouth daily as needed for allergies or rhinitis.     Cholecalciferol (VITAMIN D3) 1.25 MG (50000 UT) CAPS Take 1 weekly for 12 weeks 12 capsule 0   clonazePAM (KLONOPIN) 1 MG tablet TAKE 1 TABLET BY MOUTH EVERYDAY AT BEDTIME 30 tablet 0   Deutetrabenazine (AUSTEDO) 12 MG TABS Take 1 tablet po BID with a 9 mg tablet to equal 21 mg po BID 60 tablet 5   Deutetrabenazine (  AUSTEDO) 9 MG TABS Take 1 tab po BID with a 12 mg tab to equal 21 mg BID 60 tablet 5   docusate sodium (COLACE) 50 MG capsule Take 50 mg by mouth daily as needed for mild constipation.     fluticasone (FLONASE) 50 MCG/ACT nasal spray Place 1 spray into both nostrils daily as needed for allergies or rhinitis.     ipratropium (ATROVENT) 0.06 % nasal spray Place 2 sprays into both nostrils 3 (three) times daily. Use as needed for PND 15 mL 12   levETIRAcetam (KEPPRA) 500 MG tablet Take 1 tablet (500 mg total) by mouth 2 (two) times daily. 200 tablet 3   losartan-hydrochlorothiazide (HYZAAR) 50-12.5 MG tablet Take 1 tablet by mouth daily. 90 tablet 3   lovastatin (MEVACOR) 20 MG tablet Take 1 tablet (20 mg total) by mouth daily. 90 tablet 3   magic mouthwash w/lidocaine SOLN Diphenhydramine 12.5 mg/5 mL 1  part Viscous lidocaine 2% 1 part Maalox 1 part  Swish, hold, and spit or swallow 30 mL TID 900 mL 1   Melatonin 10 MG TABS Take 20 mg by mouth at bedtime.     metFORMIN (GLUCOPHAGE) 500 MG tablet Take 1-2 tablets twice daily     Multiple Vitamins-Minerals (CENTRUM SILVER PO) Take 1 tablet by mouth 2 (two) times a week.     OLANZapine (ZYPREXA) 10 MG tablet Take 1 tablet (10 mg total) by mouth at bedtime. 30 tablet 1   pantoprazole (PROTONIX) 40 MG tablet Take 1 tablet (40 mg total) by mouth daily as needed. (Patient taking differently: Take 40 mg by mouth 2 (two) times daily.) 90 tablet 3   traZODone (DESYREL) 100 MG tablet TAKE 1-2 TABLETS (100-200 MG TOTAL) BY MOUTH AT BEDTIME AS NEEDED. FOR SLEEP 180 tablet 3   zolpidem (AMBIEN) 10 MG tablet Take 1 tablet (10 mg total) by mouth at bedtime as needed. 30 tablet 5   No current facility-administered medications for this visit.    Allergies as of 05/24/2023 - Review Complete 03/29/2023  Allergen Reaction Noted   Cyclobenzaprine Other (See Comments) 05/17/2022   Lamictal [lamotrigine]  10/15/2011   Lybalvi [olanzapine-samidorphan] Other (See Comments) 09/30/2022   Ropinirole Nausea And Vomiting 02/01/2019   Tramadol Other (See Comments) 05/27/2020   Acetaminophen Itching, Swelling, and Rash 03/01/2022   Codeine Swelling, Rash, and Other (See Comments) 10/15/2011   Penicillins  10/15/2011    Family History  Problem Relation Age of Onset   Colon polyps Mother    Cancer Mother    Heart disease Mother    Alcoholism Mother    Hypertension Mother    Colon polyps Father    Cancer Father    Alcoholism Father    Bipolar disorder Father    Drug abuse Brother    Bipolar disorder Other    Colon cancer Neg Hx    Stomach cancer Neg Hx    Breast cancer Neg Hx    Esophageal cancer Neg Hx    Rectal cancer Neg Hx     Social History   Socioeconomic History   Marital status: Married    Spouse name: Not on file   Number of children: Not  on file   Years of education: Not on file   Highest education level: Bachelor's degree (e.g., BA, AB, BS)  Occupational History   Not on file  Tobacco Use   Smoking status: Never   Smokeless tobacco: Never  Vaping Use   Vaping status: Never Used  Substance and Sexual Activity   Alcohol use: Yes    Comment: occasionaly 1 a month   Drug use: Never   Sexual activity: Not Currently    Birth control/protection: Post-menopausal  Other Topics Concern   Not on file  Social History Narrative   Lives in 2 story home with her husband   Has 2 adult children   Radiographer, 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 Strain: Low Risk  (02/02/2023)   Overall Financial Resource Strain (CARDIA)    Difficulty of Paying Living Expenses: Not very hard  Food Insecurity: No Food Insecurity (02/02/2023)   Hunger Vital Sign    Worried About Running Out of Food in the Last Year: Never true    Ran Out of Food in the Last Year: Never true  Transportation Needs: Unmet Transportation Needs (02/02/2023)   PRAPARE - Administrator, Civil Service (Medical): Yes    Lack of Transportation (Non-Medical): No  Physical Activity: Inactive (05/27/2022)   Exercise Vital Sign    Days of Exercise per Week: 0 days    Minutes of Exercise per Session: 0 min  Stress: Stress Concern Present (02/02/2023)   [employer] of Occupational Health - Occupational Stress Questionnaire    Feeling of Stress : To some extent  Social Connections: Socially Isolated (02/02/2023)   Social Connection and Isolation Panel [NHANES]    Frequency of Communication with Friends and Family: Once a week    Frequency of Social Gatherings with Friends and Family: Once a week    Attends Religious Services: Never    Database administrator or Organizations: No    Attends Banker Meetings: Never    Marital Status: Married  Catering manager Violence: Not At Risk  (03/06/2020)   Humiliation, Afraid, Rape, and Kick questionnaire    Fear of Current or Ex-Partner: No    Emotionally Abused: No    Physically Abused: No    Sexually Abused: No    Review of Systems:    Constitutional: No weight loss, fever or chills Skin: No rash or itching Cardiovascular: No chest pain Respiratory: No SOB  Gastrointestinal: See HPI and otherwise negative Genitourinary: No dysuria or change in urinary frequency Neurological: No headache, dizziness or syncope Musculoskeletal: No new muscle or joint pain Hematologic: No bleeding  Psychiatric: No history of depression or anxiety   Physical Exam:  Vital signs: BP (!) 150/98   Pulse 84   Ht 5\' 6"  (1.676 m)   Wt 182 lb 3.2 oz (82.6 kg)   BMI 29.41 kg/m   Constitutional:   Pleasant Caucasian female appears to be in NAD, Well developed, Well nourished, alert and cooperative Head:  Normocephalic and atraumatic. Eyes:   PEERL, EOMI. No icterus. Conjunctiva pink. Ears:  Normal auditory acuity. Neck:  Supple Throat: Oral cavity and pharynx without inflammation, swelling or lesion.  Respiratory: Respirations even and unlabored. Lungs clear to auscultation bilaterally.   No wheezes, crackles, or rhonchi.  Cardiovascular: Normal S1, S2. No MRG. Regular rate and rhythm. No peripheral edema, cyanosis or pallor.  Gastrointestinal:  Soft, nondistended, nontender. No rebound or guarding. Normal bowel sounds. No appreciable masses or hepatomegaly. Rectal:  Not performed.  Msk:  Symmetrical without gross deformities. Without edema, no deformity or joint abnormality.  Neurologic:  Alert and  oriented x4;  grossly normal neurologically.  Skin:   Dry and intact without significant lesions or  rashes. Psychiatric: Husband reports trouble with short-term memory and some executive brain function. Demonstrates good judgement and reason without abnormal affect or behaviors.  RELEVANT LABS AND IMAGING: CBC    Component Value Date/Time    WBC 4.6 02/07/2023 1141   RBC 4.63 02/07/2023 1141   HGB 13.3 02/07/2023 1141   HCT 40.5 02/07/2023 1141   PLT 240.0 02/07/2023 1141   MCV 87.4 02/07/2023 1141   MCV 87.7 02/07/2013 1110   MCH 29.1 06/15/2022 0457   MCHC 32.8 02/07/2023 1141   RDW 14.1 02/07/2023 1141   LYMPHSABS 1.1 06/14/2022 1229   MONOABS 1.2 (H) 06/14/2022 1229   EOSABS 0.0 06/14/2022 1229   BASOSABS 0.0 06/14/2022 1229    CMP     Component Value Date/Time   NA 131 (L) 02/21/2023 1056   K 4.4 02/21/2023 1056   CL 90 (L) 02/21/2023 1056   CO2 30 02/21/2023 1056   GLUCOSE 111 (H) 02/21/2023 1056   BUN 11 02/21/2023 1056   CREATININE 0.73 02/21/2023 1056   CREATININE 0.79 05/08/2020 1015   CALCIUM 9.8 02/21/2023 1056   PROT 7.1 02/07/2023 1141   ALBUMIN 4.6 02/07/2023 1141   AST 19 02/07/2023 1141   ALT 24 02/07/2023 1141   ALKPHOS 107 02/07/2023 1141   BILITOT 0.4 02/07/2023 1141   GFRNONAA >60 06/15/2022 0457    Assessment: 1.  Chronic cough: For the past few years, thought possibly related to allergies and placed on Zyrtec with no real change, then PPI increased to twice daily with minimal decrease in symptoms; consider GERD versus postnasal drip versus other 2.  Screening for colon cancer: Cologuard negative in November 2023 3.  GERD: Chronic for the patient, some regurgitation, better after PPI increase to twice daily 4.  Seizure disorder  Plan: 1.  Patient did have a negative Cologuard about a month after her recommended colonoscopy, she is good for the next 3 years for colon cancer screening. 2.  Discussed chronic cough, currently on Zyrtec and Pantoprazole 40 twice daily.  Continues with some coughing.  Will add Pepcid 40 mg nightly #30 with 5 refills. 3.  Will try not to put the patient to sleep if not necessary, though likely it was the bowel prep, vomiting and missing of regular meds that caused her to have issues last time, has been under anesthesia before and done fine. 4.  Ordered a  barium esophagram with tablet for further evaluation of cough and dysphagia. 5.  Pending results from above would recommend first referral to ENT to have them rule out postnasal drip excetra, if that workup is negative then would recommend an EGD, may need to consider doing this at the hospital. 6.  Patient to follow in clinic per recommendations after imaging above.  Hyacinth Meeker, PA-C [facility] 05/24/2023, 10:55 AM  Cc: Pearline Cables, MD

## 2023-06-02 ENCOUNTER — Other Ambulatory Visit: Payer: Self-pay | Admitting: Physician Assistant

## 2023-06-02 ENCOUNTER — Encounter: Payer: Self-pay | Admitting: Family Medicine

## 2023-06-02 ENCOUNTER — Ambulatory Visit (HOSPITAL_COMMUNITY)
Admission: RE | Admit: 2023-06-02 | Discharge: 2023-06-02 | Disposition: A | Discharge status: Home / Self Care | Payer: Medicare Other | Source: Ambulatory Visit | Attending: Physician Assistant | Admitting: Physician Assistant

## 2023-06-02 DIAGNOSIS — K2100 Gastro-esophageal reflux disease with esophagitis, without bleeding: Secondary | ICD-10-CM | POA: Diagnosis not present

## 2023-06-02 DIAGNOSIS — Z1211 Encounter for screening for malignant neoplasm of colon: Secondary | ICD-10-CM

## 2023-06-02 DIAGNOSIS — K219 Gastro-esophageal reflux disease without esophagitis: Secondary | ICD-10-CM | POA: Diagnosis not present

## 2023-06-02 DIAGNOSIS — K449 Diaphragmatic hernia without obstruction or gangrene: Secondary | ICD-10-CM | POA: Diagnosis not present

## 2023-06-02 DIAGNOSIS — R059 Cough, unspecified: Secondary | ICD-10-CM

## 2023-06-02 DIAGNOSIS — G40309 Generalized idiopathic epilepsy and epileptic syndromes, not intractable, without status epilepticus: Secondary | ICD-10-CM

## 2023-06-06 ENCOUNTER — Encounter: Payer: Self-pay | Admitting: Psychiatry

## 2023-06-06 ENCOUNTER — Ambulatory Visit: Payer: Medicare Other | Admitting: Psychiatry

## 2023-06-06 DIAGNOSIS — R635 Abnormal weight gain: Secondary | ICD-10-CM | POA: Diagnosis not present

## 2023-06-06 DIAGNOSIS — F319 Bipolar disorder, unspecified: Secondary | ICD-10-CM | POA: Diagnosis not present

## 2023-06-06 DIAGNOSIS — F4310 Post-traumatic stress disorder, unspecified: Secondary | ICD-10-CM

## 2023-06-06 DIAGNOSIS — T50905A Adverse effect of unspecified drugs, medicaments and biological substances, initial encounter: Secondary | ICD-10-CM

## 2023-06-06 DIAGNOSIS — F5101 Primary insomnia: Secondary | ICD-10-CM | POA: Diagnosis not present

## 2023-06-06 MED ORDER — ALPRAZOLAM ER 2 MG PO TB24: ORAL_TABLET | ORAL | 1 refills | Status: AC

## 2023-06-06 MED ORDER — OLANZAPINE 10 MG PO TABS: 10.0000 mg | ORAL_TABLET | Freq: Every day | ORAL | 1 refills | Status: AC

## 2023-06-06 MED ORDER — ALPRAZOLAM 1 MG PO TABS: ORAL_TABLET | ORAL | 5 refills | Status: AC

## 2023-06-06 MED ORDER — CARBAMAZEPINE ER 300 MG PO CP12: ORAL_CAPSULE | ORAL | 1 refills | Status: AC

## 2023-06-06 MED ORDER — CLONAZEPAM 1 MG PO TABS
1.0000 mg | ORAL_TABLET | Freq: Every day | ORAL | 1 refills | Status: DC
Start: 2023-06-13 — End: 2024-07-05

## 2023-06-06 MED ORDER — METFORMIN HCL 500 MG PO TABS: ORAL_TABLET | ORAL | 1 refills | Status: DC

## 2023-06-06 MED ORDER — ZOLPIDEM TARTRATE 10 MG PO TABS: 10.0000 mg | ORAL_TABLET | Freq: Every evening | ORAL | 1 refills | Status: AC | PRN

## 2023-06-06 NOTE — Progress Notes (Unsigned)
Theresa [name] [id number] 1955-10-21 67 y.o.  Subjective:   Patient ID:  Theresa [name] is a 67 y.o. (DOB [birth year]) female.  Chief Complaint: No chief complaint on file.   HPI Theresa [name] presents to the office today for follow-up of Bipolar Disorder, anxiety, and insomnia.  She is accompanied by her husband.   She reports that she is no longer in therapy  She reports, "I've been feeling pretty good." She reports that TD is bothersome at times. She reports that she notices worsening TD today and that she has difficulty staying still at times. She reports that her mood has been stable. She reports that she usually will take Xanax prn before going out to eat. She reports that Xanax prn seems to reduce TD. "Anxiety has been pretty good." Husband reports that he notices that she has occasional manic symptoms, such as starting multiple conversations after a night of poor sleep. He reports that these changes typically last for a couple of days and then resolve. She reports that her energy has been "ok, but I don't do much anyhow." She reports that her motivation is lower due to TD. Denies SI. Denies any self-injurious ideation in about 10 years.  She reports that she will have 1-2 nights of poor sleep a week, which is consistent with her baseline.   Ambien last filled 05/07/23 x2 Klonopin last filled 05/16/23 Alprazolam 1 mg last filled 05/13/23 x1 Alprazolam ER 2 mg last filled 04/26/23 x2   Past Psychiatric Medication Trials: Olanzapine Seroquel Saphris Depakote-self-injurious behavior Lamictal Keppra Carbamazepine Xanax Klonopin Ambien Trazodone Benztropine Ingrezza Propranolol- Ineffective  AIMS    Flowsheet Row Office Visit from 03/29/2023 in [facility] Office Visit from 01/31/2023 in [facility] Office Visit from 09/30/2022 in [facility] Office Visit from 05/20/2022 in [facility] Office Visit from 10/13/2021 in [facility]  AIMS Total Score 10 10 13 8 6       Mini-Mental    Flowsheet Row Office Visit from 02/28/2017 in [facility]  Total Score (max 30 points ) 29      [id number]    Flowsheet Row Clinical Support from 05/27/2022 in [facility] at [facility] Office Visit from 05/17/2022 in [facility] at [facility] Office Visit from 02/28/2017 in [facility] at [facility] Office Visit from 09/19/2015 in Primary Care at [facility] Visit from 06/05/2015 in Primary Care at [facility] Total Score 0 1 0 0 0        Review of Systems:  Review of Systems  Respiratory:  Positive for cough.   Musculoskeletal:  Negative for gait problem.  Allergic/Immunologic: Positive for environmental allergies.  Neurological:  Positive for tremors.  Psychiatric/Behavioral:         Please refer to HPI    Medications: I have reviewed the patient's current medications.  Current Outpatient Medications  Medication Sig Dispense Refill   ALPRAZolam (XANAX XR) 2 MG 24 hr tablet TAKE 1 TABLET BY MOUTH IN THE  MORNING AND AT BEDTIME 180 tablet 1   ALPRAZolam (XANAX) 1 MG tablet TAKE 1 TABLET BY MOUTH DAILY AS  NEEDED FOR SEVERE ANXIETY 30 tablet 0   carbamazepine (CARBATROL) 300 MG 12 hr capsule Take 1 capsule (300 mg total) by mouth every morning AND 2 capsules (  600 mg total) at bedtime. 270 capsule 1   cetirizine (ZYRTEC) 10 MG tablet Take 10 mg by mouth daily as needed for allergies or rhinitis.     Cholecalciferol (VITAMIN D3) 1.25 MG (50000 UT) CAPS Take 1 weekly for 12 weeks 12 capsule 0   clonazePAM (KLONOPIN) 1 MG tablet TAKE 1 TABLET BY MOUTH EVERYDAY AT BEDTIME 30 tablet 0   Deutetrabenazine (AUSTEDO) 12 MG TABS Take 1 tablet po BID with a 9 mg tablet to equal 21 mg po BID 60 tablet 5    Deutetrabenazine (AUSTEDO) 9 MG TABS Take 1 tab po BID with a 12 mg tab to equal 21 mg BID 60 tablet 5   docusate sodium (COLACE) 50 MG capsule Take 50 mg by mouth daily as needed for mild constipation.     famotidine (PEPCID) 40 MG tablet Take 1 tablet (40 mg total) by mouth at bedtime. 30 tablet 5   fluticasone (FLONASE) 50 MCG/ACT nasal spray Place 1 spray into both nostrils daily as needed for allergies or rhinitis.     ipratropium (ATROVENT) 0.06 % nasal spray Place 2 sprays into both nostrils 3 (three) times daily. Use as needed for PND 15 mL 12   levETIRAcetam (KEPPRA) 500 MG tablet Take 1 tablet (500 mg total) by mouth 2 (two) times daily. 200 tablet 3   losartan-hydrochlorothiazide (HYZAAR) 50-12.5 MG tablet Take 1 tablet by mouth daily. 90 tablet 3   lovastatin (MEVACOR) 20 MG tablet Take 1 tablet (20 mg total) by mouth daily. 90 tablet 3   magic mouthwash w/lidocaine SOLN Diphenhydramine 12.5 mg/5 mL 1 part Viscous lidocaine 2% 1 part Maalox 1 part  Swish, hold, and spit or swallow 30 mL TID 900 mL 1   Melatonin 10 MG TABS Take 20 mg by mouth at bedtime.     metFORMIN (GLUCOPHAGE) 500 MG tablet Take 1-2 tablets twice daily     Multiple Vitamins-Minerals (CENTRUM SILVER PO) Take 1 tablet by mouth 2 (two) times a week.     OLANZapine (ZYPREXA) 10 MG tablet Take 1 tablet (10 mg total) by mouth at bedtime. 30 tablet 1   pantoprazole (PROTONIX) 40 MG tablet Take 1 tablet (40 mg total) by mouth daily as needed. (Patient taking differently: Take 40 mg by mouth 2 (two) times daily.) 90 tablet 3   traZODone (DESYREL) 100 MG tablet TAKE 1-2 TABLETS (100-200 MG TOTAL) BY MOUTH AT BEDTIME AS NEEDED. FOR SLEEP 180 tablet 3   zolpidem (AMBIEN) 10 MG tablet Take 1 tablet (10 mg total) by mouth at bedtime as needed. 30 tablet 5   No current facility-administered medications for this visit.    Medication Side Effects: Other: Tremor, TD  Allergies:  Allergies  Allergen Reactions    Cyclobenzaprine Other (See Comments)    Blisters in mouth   Lamictal [Lamotrigine]     Acute renal failure   Lybalvi [Olanzapine-Samidorphan] Other (See Comments)    Nausea, dizziness   Ropinirole Nausea And Vomiting   Tramadol Other (See Comments)    Reaction??   Acetaminophen Itching, Swelling and Rash   Codeine Swelling, Rash and Other (See Comments)    Can take hydrocodone   Penicillins     Abdominal pain    Past Medical History:  Diagnosis Date   Adenomatous colon polyp    Allergy    Anxiety    Bipolar 1 disorder (HCC)    Blood transfusion without reported diagnosis    Depression    Elevated LFTs  GERD (gastroesophageal reflux disease)    Hepatic steatosis 05/07/2013   Hyperlipidemia    Seizures (HCC) 1977   last seizure 05/2022   Tardive dyskinesia     Past Medical History, Surgical history, Social history, and Family history were reviewed and updated as appropriate.   Please see review of systems for further details on the patient's review from today.   Objective:   Physical Exam:  There were no vitals taken for this visit.  Physical Exam  Lab Review:     Component Value Date/Time   NA 131 (L) 02/21/2023 1056   K 4.4 02/21/2023 1056   CL 90 (L) 02/21/2023 1056   CO2 30 02/21/2023 1056   GLUCOSE 111 (H) 02/21/2023 1056   BUN 11 02/21/2023 1056   CREATININE 0.73 02/21/2023 1056   CREATININE 0.79 05/08/2020 1015   CALCIUM 9.8 02/21/2023 1056   PROT 7.1 02/07/2023 1141   ALBUMIN 4.6 02/07/2023 1141   AST 19 02/07/2023 1141   ALT 24 02/07/2023 1141   ALKPHOS 107 02/07/2023 1141   BILITOT 0.4 02/07/2023 1141   GFRNONAA >60 06/15/2022 0457       Component Value Date/Time   WBC 4.6 02/07/2023 1141   RBC 4.63 02/07/2023 1141   HGB 13.3 02/07/2023 1141   HCT 40.5 02/07/2023 1141   PLT 240.0 02/07/2023 1141   MCV 87.4 02/07/2023 1141   MCV 87.7 02/07/2013 1110   MCH 29.1 06/15/2022 0457   MCHC 32.8 02/07/2023 1141   RDW 14.1 02/07/2023 1141    LYMPHSABS 1.1 06/14/2022 1229   MONOABS 1.2 (H) 06/14/2022 1229   EOSABS 0.0 06/14/2022 1229   BASOSABS 0.0 06/14/2022 1229    No results found for: "POCLITH", "LITHIUM"   Lab Results  Component Value Date   CBMZ 5.2 02/01/2019     .res Assessment: Plan:    There are no diagnoses linked to this encounter.   Please see After Visit Summary for patient specific instructions.  Future Appointments  Date Time Provider Department Center  07/08/2023  1:10 PM [facility] MM 2 [facility]  09/26/2023 10:00 AM Van Clines, MD [facility] None  09/26/2023  1:00 PM Copland, [name], MD [facility]    No orders of the defined types were placed in this encounter.   -------------------------------

## 2023-06-07 ENCOUNTER — Ambulatory Visit: Payer: Medicare Other | Admitting: Psychiatry

## 2023-06-11 ENCOUNTER — Other Ambulatory Visit: Payer: Self-pay | Admitting: Psychiatry

## 2023-06-11 ENCOUNTER — Encounter: Payer: Self-pay | Admitting: Family Medicine

## 2023-06-11 DIAGNOSIS — F319 Bipolar disorder, unspecified: Secondary | ICD-10-CM

## 2023-06-15 ENCOUNTER — Other Ambulatory Visit: Payer: Self-pay

## 2023-06-15 ENCOUNTER — Encounter: Payer: Self-pay | Admitting: Family Medicine

## 2023-06-15 DIAGNOSIS — K219 Gastro-esophageal reflux disease without esophagitis: Secondary | ICD-10-CM

## 2023-06-15 MED ORDER — FAMOTIDINE 40 MG PO TABS: 40.0000 mg | ORAL_TABLET | Freq: Every day | ORAL | 5 refills | Status: DC

## 2023-06-16 MED ORDER — PANTOPRAZOLE SODIUM 40 MG PO TBEC
40.0000 mg | DELAYED_RELEASE_TABLET | Freq: Every day | ORAL | 3 refills | Status: DC
Start: 2023-06-16 — End: 2023-07-15

## 2023-06-16 NOTE — Addendum Note (Signed)
Addended by: Abbe Amsterdam C on: 06/16/2023 06:30 AM   Modules accepted: Orders

## 2023-06-19 IMAGING — MG MM DIGITAL SCREENING BILAT W/ TOMO AND CAD
6 of 10 series · 6 of 30 positions shown · non-contrast
Comparison: Previous exam(s).

CLINICAL DATA: Screening.

EXAM:
DIGITAL SCREENING BILATERAL MAMMOGRAM WITH TOMOSYNTHESIS AND CAD
TECHNIQUE: Bilateral screening digital craniocaudal and mediolateral oblique
mammograms were obtained. Bilateral screening digital breast
tomosynthesis was performed. The images were evaluated with
computer-aided detection.

[L CC synth-2D (1 of 2)]
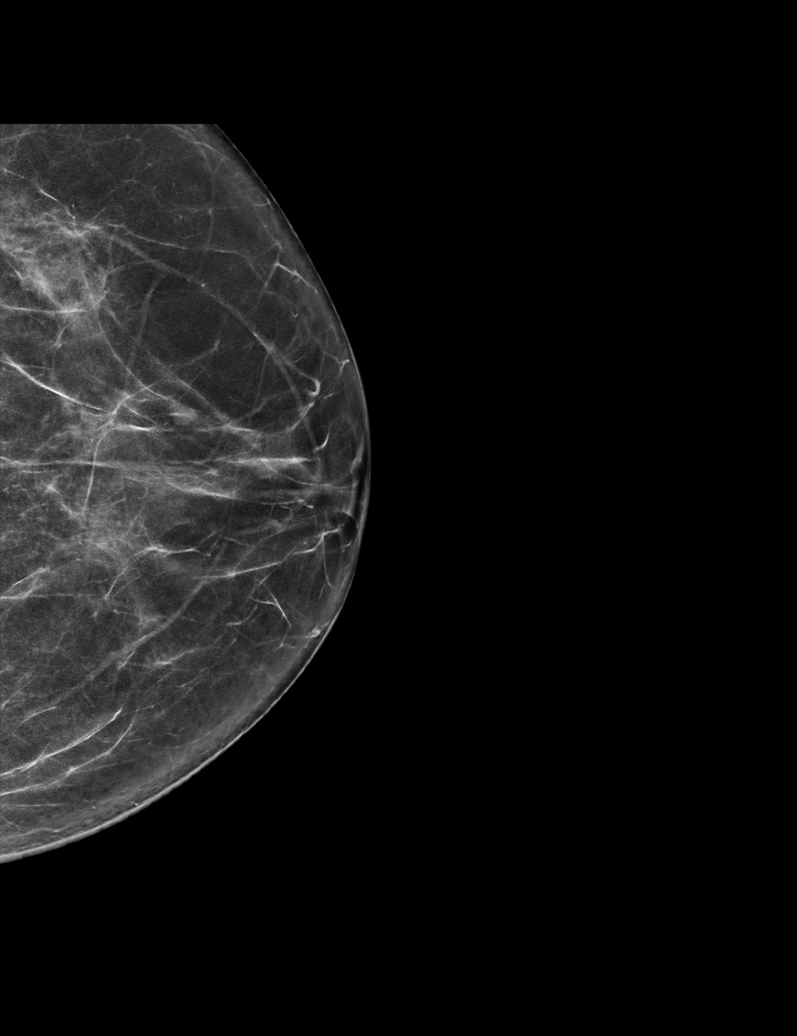

[R CC synth-2D]
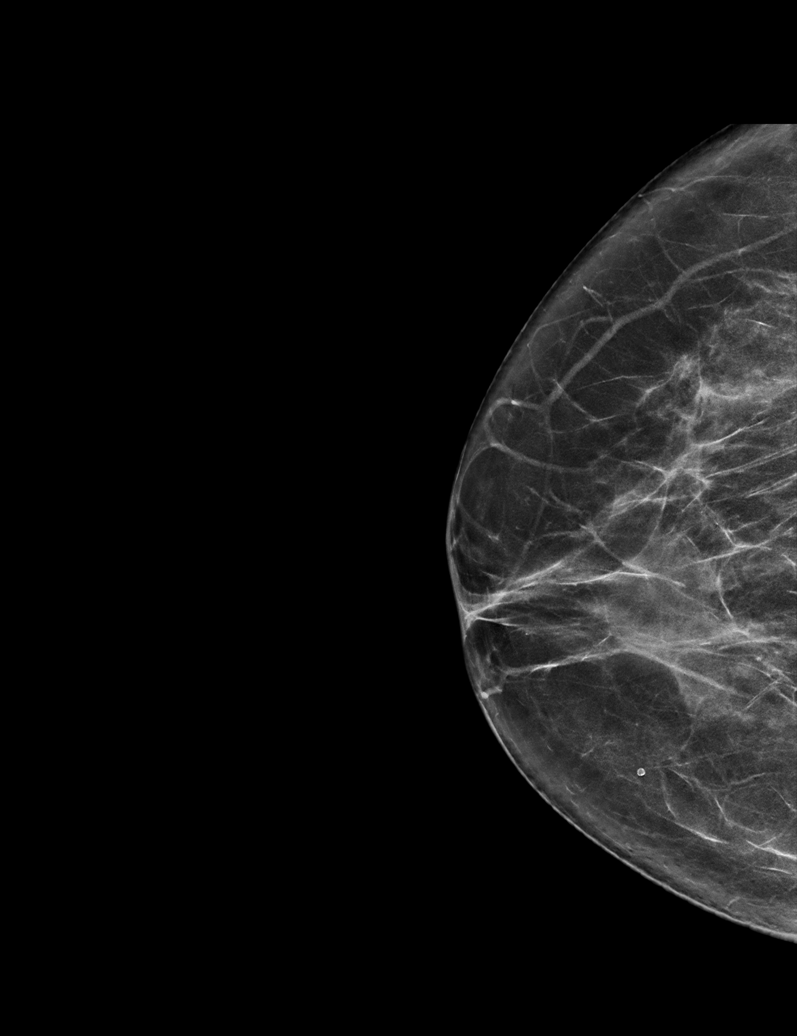

[R MLO synth-2D]
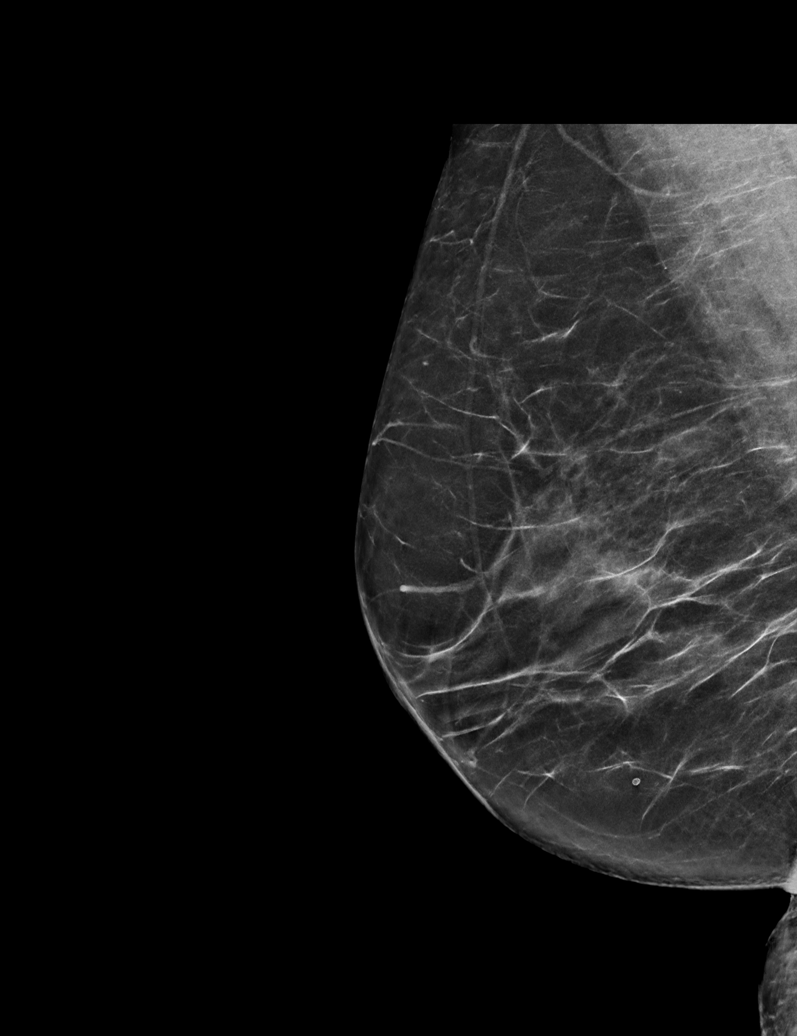

[L MLO synth-2D]
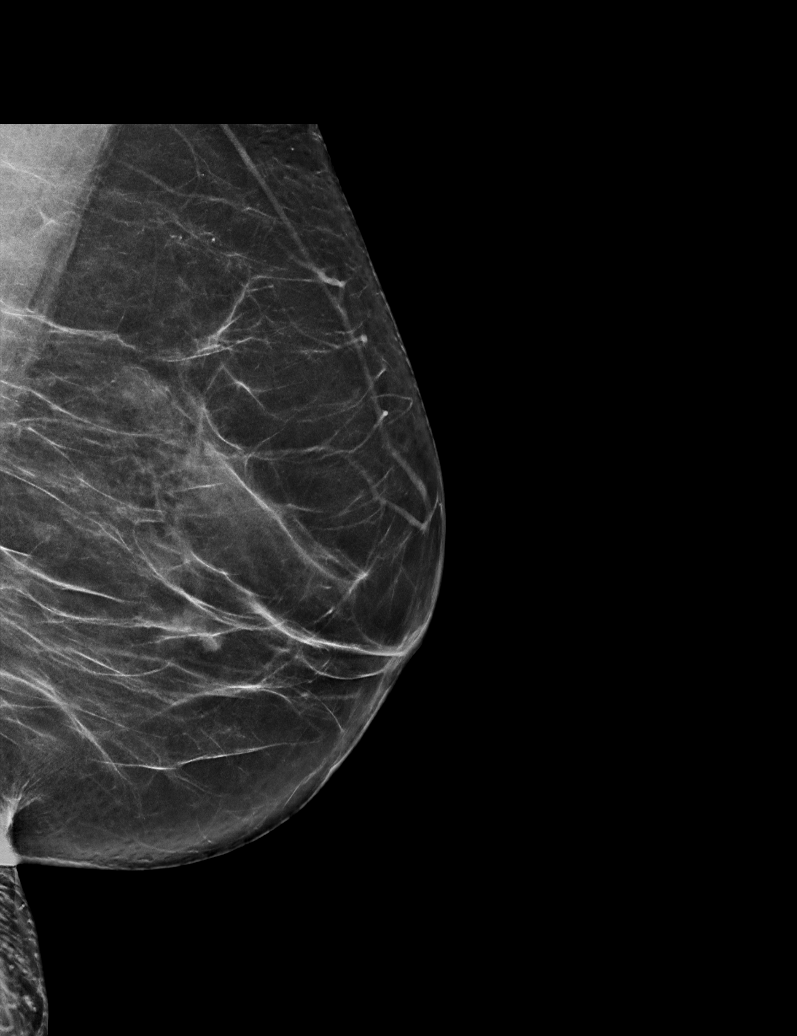

[L CC synth-2D (2 of 2)]
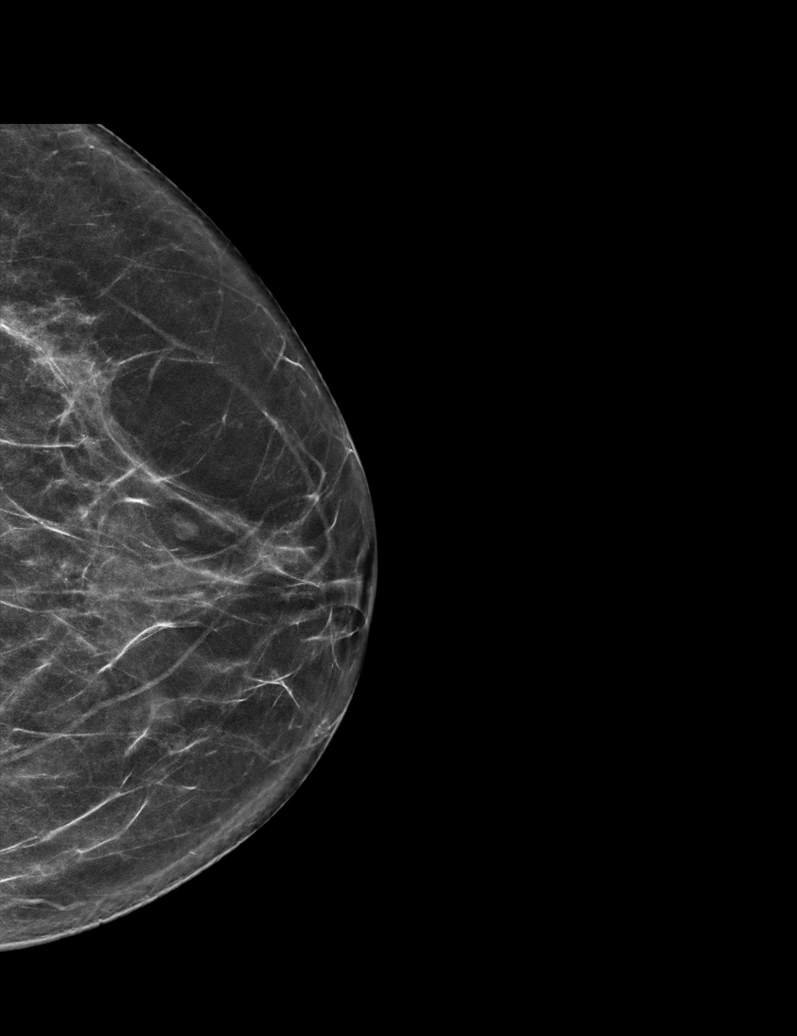

[L CC tomo · tomo slice 29/57.0]
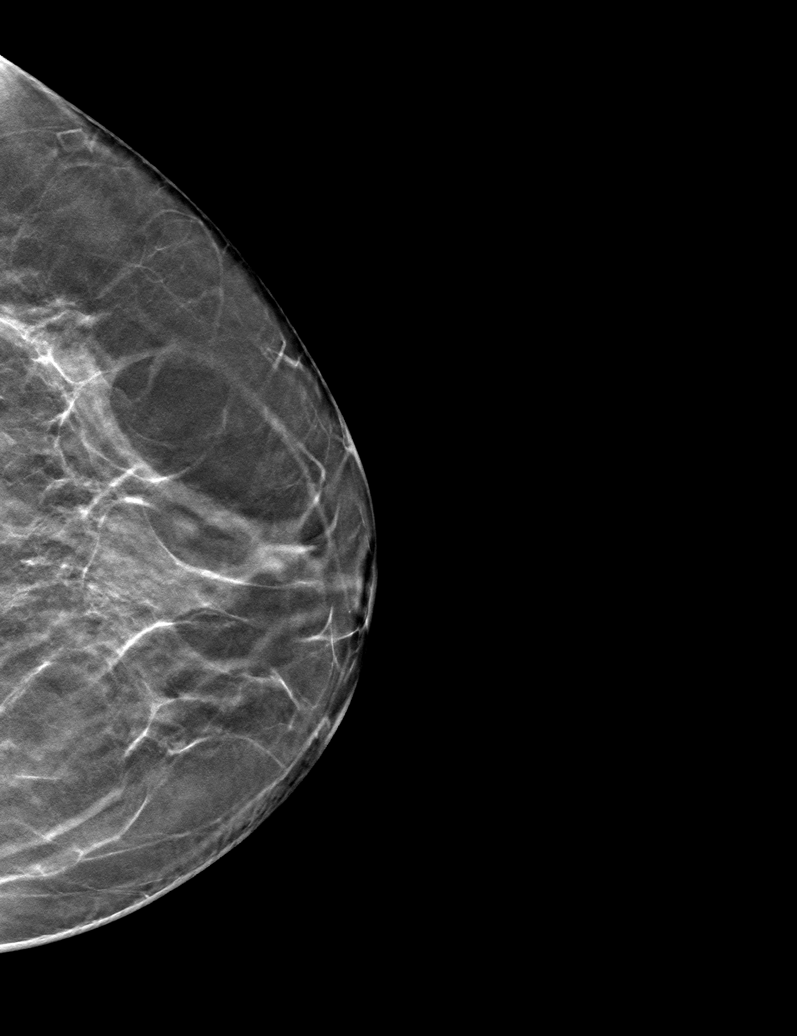

[6 of 30 positions shown; findings below may reference images not displayed]

ACR Breast Density Category b: There are scattered areas of
fibroglandular density.
FINDINGS: There are no findings suspicious for malignancy.
IMPRESSION: No mammographic evidence of malignancy. A result letter of this
screening mammogram will be mailed directly to the patient.

RECOMMENDATION:
Screening mammogram in one year. (Code:51-O-LD2)

BI-RADS CATEGORY  1: Negative.

## 2023-06-21 ENCOUNTER — Other Ambulatory Visit: Payer: Self-pay | Admitting: *Deleted

## 2023-06-21 NOTE — Telephone Encounter (Signed)
Called patient to inform about continuing the medication Pepcid as well as the Pantoprazole as prescribed. Patient already has appt with Dr. Lavon Paganini scheduled on 07/12/23. Patient called back and informed of the already scheduled appt. Patient pleased to be able to see provider sooner.

## 2023-06-24 ENCOUNTER — Telehealth: Payer: Self-pay | Admitting: Psychiatry

## 2023-06-24 NOTE — Telephone Encounter (Signed)
Patient agreeable to taking 15 mg for the next 3 days and will update [place] on Monday.

## 2023-06-24 NOTE — Telephone Encounter (Signed)
Pt LVM @ 6:19a stating that she is not sleeping.  She would like to know what she can do.  Next appt 12/9

## 2023-06-24 NOTE — Telephone Encounter (Signed)
Patient reporting she is only sleeping about an hour and does go back to sleep for about another hour. She gets to sleep ok, is on multiple sleep meds, but she doesn't stay asleep. [place] Spaniel this has been for the past 4-5 days.   No new stressors, discussed sleep hygiene. Asked if she thought the change of season with it getting dark earlier might be affecting her. She said she thought it might since she is bipolar. She reports you usually increase her olanzapine but she doesn't want to do that. She is not tearful and her affect is not flat

## 2023-06-24 NOTE — Telephone Encounter (Signed)
She has had periods of sleeplessness in the past with mood changes. She is already taking multiple medications for insomnia to include Trazodone, Ambien, and Xanax XR. An increase in Olanzapine has typically been what has broken cycles of sleeplessness in the past. Would she be ok with increasing Olanzapine to 15 mg the next 3 nights and calling back on Monday with an update? She may not need to stay on higher dose long-term and even increasing dose for a few days may help stabilize sleep and mood.

## 2023-07-03 ENCOUNTER — Other Ambulatory Visit: Payer: Self-pay | Admitting: Psychiatry

## 2023-07-03 ENCOUNTER — Other Ambulatory Visit: Payer: Self-pay | Admitting: Family Medicine

## 2023-07-03 DIAGNOSIS — T50905A Adverse effect of unspecified drugs, medicaments and biological substances, initial encounter: Secondary | ICD-10-CM

## 2023-07-06 ENCOUNTER — Encounter: Payer: Self-pay | Admitting: Psychiatry

## 2023-07-08 ENCOUNTER — Ambulatory Visit
Admission: RE | Admit: 2023-07-08 | Discharge: 2023-07-08 | Disposition: A | Discharge status: Home / Self Care | Payer: Medicare Other | Source: Ambulatory Visit | Attending: Family Medicine | Admitting: Family Medicine

## 2023-07-08 DIAGNOSIS — Z1231 Encounter for screening mammogram for malignant neoplasm of breast: Secondary | ICD-10-CM | POA: Diagnosis not present

## 2023-07-09 ENCOUNTER — Other Ambulatory Visit: Payer: Self-pay | Admitting: Psychiatry

## 2023-07-09 DIAGNOSIS — F319 Bipolar disorder, unspecified: Secondary | ICD-10-CM

## 2023-07-10 NOTE — Telephone Encounter (Signed)
Call to verify dose. Changes based on sx.

## 2023-07-12 ENCOUNTER — Telehealth: Payer: Self-pay | Admitting: Gastroenterology

## 2023-07-12 ENCOUNTER — Ambulatory Visit: Payer: Medicare Other | Admitting: Gastroenterology

## 2023-07-13 ENCOUNTER — Encounter: Payer: Self-pay | Admitting: Family Medicine

## 2023-07-13 DIAGNOSIS — R053 Chronic cough: Secondary | ICD-10-CM

## 2023-07-13 DIAGNOSIS — R059 Cough, unspecified: Secondary | ICD-10-CM

## 2023-07-13 DIAGNOSIS — K219 Gastro-esophageal reflux disease without esophagitis: Secondary | ICD-10-CM

## 2023-07-14 NOTE — Telephone Encounter (Signed)
Patient called and informed that Ms. Lemmon, PA would like to refer patient to an ENT. Left message on VM and to call back if needed, at the [phone] number. Was not able to speak to the patient, lmtcb. Also informed Ms. Zerita Boers, [place] that patient is interested in knowing the results for the barium swallow: noted reading was for Dr. Lavon Paganini and need to know if I should read what was for MD to also be read to the patient? Order placed for ENT due to cough.

## 2023-07-14 NOTE — Telephone Encounter (Signed)
Patient called and stated that she would like a referral to an ENT. Patient is also requesting a call back. Please advise.

## 2023-07-15 MED ORDER — PANTOPRAZOLE SODIUM 40 MG PO TBEC
40.0000 mg | DELAYED_RELEASE_TABLET | Freq: Two times a day (BID) | ORAL | 1 refills | Status: DC
Start: 2023-07-15 — End: 2024-02-09

## 2023-07-15 NOTE — Telephone Encounter (Addendum)
Called the patient to inform of the information per Ms. Lemmon, PA due to the Barium swallow. Patient was informed at the GE junction, where the esophagus meets the stomach, there is mild inflammation noted at the esophagus causing the esophagitis. Also noted is a tiny hiatal hernia, and a trace of left pleural effusion, which  could be adding to the cough. Patient was informed that Ms. Lemmon, PA has ordered a ENT referral and she should be receiving a call to schedule an appt. Patient understood and greed.

## 2023-07-15 NOTE — Addendum Note (Signed)
Addended by: Abbe Amsterdam C on: 07/15/2023 06:26 AM   Modules accepted: Orders

## 2023-07-25 ENCOUNTER — Other Ambulatory Visit: Payer: Self-pay | Admitting: *Deleted

## 2023-07-25 DIAGNOSIS — Z961 Presence of intraocular lens: Secondary | ICD-10-CM | POA: Diagnosis not present

## 2023-07-25 DIAGNOSIS — H02831 Dermatochalasis of right upper eyelid: Secondary | ICD-10-CM | POA: Diagnosis not present

## 2023-07-25 DIAGNOSIS — H02834 Dermatochalasis of left upper eyelid: Secondary | ICD-10-CM | POA: Diagnosis not present

## 2023-07-25 DIAGNOSIS — H31093 Other chorioretinal scars, bilateral: Secondary | ICD-10-CM | POA: Diagnosis not present

## 2023-07-25 NOTE — Telephone Encounter (Signed)
Called patient to see if she has been contacted via the ENT Specialist and nurse was informed that she has not been contacted. Called the ENT Specialist today and was also notified via Aniyah, that she received the referral on 07/14/23, but did not receive the referral fax information., although the fax information states it was faxed successfully on 07/14/23. Notified Aniyah that I will fax the information again. ENT gave me the information to give to the patient for her scheduled appt. The patient was called and given the appt for 08/18/2023 at 11:00 with Dr. Allena Katz. Mosetta Pigeon states she has the order for the referral dated 07/14/23. Fax information sent again and patient notified of the address and the appt with Dr. Allena Katz. Patient agreeable.

## 2023-07-28 DIAGNOSIS — Z9189 Other specified personal risk factors, not elsewhere classified: Secondary | ICD-10-CM | POA: Diagnosis not present

## 2023-08-01 ENCOUNTER — Ambulatory Visit: Payer: Medicare Other | Admitting: Psychiatry

## 2023-08-01 ENCOUNTER — Encounter: Payer: Self-pay | Admitting: Psychiatry

## 2023-08-01 VITALS — BP 139/89 | HR 84

## 2023-08-01 DIAGNOSIS — F319 Bipolar disorder, unspecified: Secondary | ICD-10-CM

## 2023-08-01 DIAGNOSIS — G2401 Drug induced subacute dyskinesia: Secondary | ICD-10-CM

## 2023-08-01 DIAGNOSIS — F4310 Post-traumatic stress disorder, unspecified: Secondary | ICD-10-CM

## 2023-08-01 DIAGNOSIS — F5101 Primary insomnia: Secondary | ICD-10-CM

## 2023-08-01 MED ORDER — AUSTEDO XR 48 MG PO TB24: 48.0000 mg | ORAL_TABLET | Freq: Every day | ORAL | 3 refills | Status: AC

## 2023-08-01 NOTE — Progress Notes (Unsigned)
Theresa [name] [id number] June 04, 1956 67 y.o.  Subjective:   Patient ID:  Theresa [name] is a 67 y.o. (DOB Feb 08, 1956) female.  Chief Complaint: No chief complaint on file.   HPI Theresa [name] presents to the office today for follow-up of Bipolar Disorder, Anxiety, and insomnia. She reports, "my tardive dyskinesia has been really bad" and has been using Xanax IR 1 mg to help manage this. She reports that Xanax will relieve tardive dyskinesia for several hours. She reports that she feels very restless. She notices involuntary movements in her legs, trunk, and tongue movements. She notices some improvement in TD after swimming. She has just eliminated caffeine to try to reduce TD. She has not been able to paint recently. She reports that vacuuming and some physical activity makes TD worse.   Husband reports that he has noticed some manic symptoms a few weeks ago. He reports that she has some impulsivity at times, such as interrupting him to ask questions. She has not been sleeping in the morning after taking her medications. She reports poor concentration. Appetite has been somewhat decreased. She reports that her weight stays in the same range. She reports that she had a brief moment of depression and had passive death wishes. Denies SI.   She reports, "anxiety is better than it was years ago." She reports that at times, "I am not sure if it is tardive dyskinesia or anxiety... I think the tardive dyskinesia is making me anxious."   She reports that she has been sleeping well. Husband reports that she was taking 2 Trazodone tablets nightly and decreased to 1 tablet nightly.   She reduced Olanzapine from 15 mg to 10 mg around 07/20/23.  They have 5 grandchildren.   Past Psychiatric Medication Trials: Olanzapine Seroquel Saphris Depakote-self-injurious behavior Lamictal Keppra Carbamazepine Xanax Klonopin Ambien Trazodone Benztropine Ingrezza Propranolol- Ineffective  AIMS     Flowsheet Row Office Visit from 08/01/2023 in [facility] Office Visit from 06/06/2023 in [facility] Office Visit from 03/29/2023 in [facility] Office Visit from 01/31/2023 in [facility] Office Visit from 09/30/2022 in [facility]  AIMS Total Score 10 7 10 10 13       Mini-Mental    Flowsheet Row Office Visit from 02/28/2017 in [facility]  Total Score (max 30 points ) 29      [id number]    Flowsheet Row Clinical Support from 05/27/2022 in [facility] Primary [facility] at [facility] Office Visit from 05/17/2022 in [facility] at [facility] Office Visit from 02/28/2017 in [facility] Care at [facility] Office Visit from 09/19/2015 in Primary Care at [facility] Visit from 06/05/2015 in Primary Care at [facility] Total Score 0 1 0 0 0        Review of Systems:  Review of Systems  Respiratory:  Positive for cough.   Musculoskeletal:  Negative for gait problem.  Neurological:  Positive for tremors.  Psychiatric/Behavioral:         Please refer to HPI    Medications: I have reviewed the patient's current medications.  Current Outpatient Medications  Medication Sig Dispense Refill   losartan-hydrochlorothiazide (HYZAAR) 50-12.5 MG tablet Take 1 tablet by mouth daily. 90 tablet 3   OLANZapine (ZYPREXA) 10 MG tablet Take 1 tablet (10 mg total) by mouth at  bedtime. 90 tablet 1   ALPRAZolam (XANAX XR) 2 MG 24 hr tablet TAKE 1 TABLET BY MOUTH IN THE  MORNING AND AT BEDTIME 180 tablet 1   ALPRAZolam (XANAX) 1 MG tablet TAKE 1 TABLET BY MOUTH DAILY AS  NEEDED FOR SEVERE ANXIETY 30 tablet 5   carbamazepine (CARBATROL) 300 MG 12 hr capsule Take 1 capsule (300 mg total) by mouth every morning AND 2 capsules (600 mg total) at bedtime. 270 capsule  1   cetirizine (ZYRTEC) 10 MG tablet Take 10 mg by mouth daily as needed for allergies or rhinitis. (Patient not taking: Reported on 06/06/2023)     Cholecalciferol (VITAMIN D3) 1.25 MG (50000 UT) CAPS Take 1 weekly for 12 weeks 12 capsule 0   clonazePAM (KLONOPIN) 1 MG tablet Take 1 tablet (1 mg total) by mouth at bedtime. 90 tablet 1   Deutetrabenazine (AUSTEDO) 12 MG TABS Take 1 tablet po BID with a 9 mg tablet to equal 21 mg po BID 60 tablet 5   Deutetrabenazine (AUSTEDO) 9 MG TABS Take 1 tab po BID with a 12 mg tab to equal 21 mg BID 60 tablet 5   docusate sodium (COLACE) 50 MG capsule Take 50 mg by mouth daily as needed for mild constipation.     famotidine (PEPCID) 40 MG tablet Take 1 tablet (40 mg total) by mouth at bedtime. 30 tablet 5   fluticasone (FLONASE) 50 MCG/ACT nasal spray Place 1 spray into both nostrils daily as needed for allergies or rhinitis.     ipratropium (ATROVENT) 0.06 % nasal spray Place 2 sprays into both nostrils 3 (three) times daily. Use as needed for PND 15 mL 12   levETIRAcetam (KEPPRA) 500 MG tablet Take 1 tablet (500 mg total) by mouth 2 (two) times daily. 200 tablet 3   lovastatin (MEVACOR) 20 MG tablet TAKE 1 TABLET BY MOUTH EVERY DAY 90 tablet 3   magic mouthwash w/lidocaine SOLN Diphenhydramine 12.5 mg/5 mL 1 part Viscous lidocaine 2% 1 part Maalox 1 part  Swish, hold, and spit or swallow 30 mL TID 900 mL 1   Melatonin 10 MG TABS Take 20 mg by mouth at bedtime.     metFORMIN (GLUCOPHAGE) 500 MG tablet TAKE 1 TABLET BY MOUTH TWICE A DAY WITH MEALS 180 tablet 0   Multiple Vitamins-Minerals (CENTRUM SILVER PO) Take 1 tablet by mouth 2 (two) times a week.     pantoprazole (PROTONIX) 40 MG tablet Take 1 tablet (40 mg total) by mouth 2 (two) times daily. 180 tablet 1   traZODone (DESYREL) 100 MG tablet TAKE 1-2 TABLETS (100-200 MG TOTAL) BY MOUTH AT BEDTIME AS NEEDED. FOR SLEEP 180 tablet 3   zolpidem (AMBIEN) 10 MG tablet Take 1 tablet (10 mg total) by mouth  at bedtime as needed. 90 tablet 1   No current facility-administered medications for this visit.    Medication Side Effects: Other: TD  Allergies:  Allergies  Allergen Reactions   Cyclobenzaprine Other (See Comments)    Blisters in mouth   Lamictal [Lamotrigine]     Acute renal failure   Lybalvi [Olanzapine-Samidorphan] Other (See Comments)    Nausea, dizziness   Ropinirole Nausea And Vomiting   Tramadol Other (See Comments)    Reaction??   Acetaminophen Itching, Swelling and Rash   Codeine Swelling, Rash and Other (See Comments)    Can take hydrocodone   Penicillins     Abdominal pain    Past Medical History:  Diagnosis Date  Adenomatous colon polyp    Allergy    Anxiety    Bipolar 1 disorder (HCC)    Blood transfusion without reported diagnosis    Depression    Elevated LFTs    GERD (gastroesophageal reflux disease)    Hepatic steatosis 05/07/2013   Hyperlipidemia    Seizures (HCC) 1977   last seizure 05/2022   Tardive dyskinesia     Past Medical History, Surgical history, Social history, and Family history were reviewed and updated as appropriate.   Please see review of systems for further details on the patient's review from today.   Objective:   Physical Exam:  BP 139/89   Pulse 84   Physical Exam  Lab Review:     Component Value Date/Time   NA 131 (L) 02/21/2023 1056   K 4.4 02/21/2023 1056   CL 90 (L) 02/21/2023 1056   CO2 30 02/21/2023 1056   GLUCOSE 111 (H) 02/21/2023 1056   BUN 11 02/21/2023 1056   CREATININE 0.73 02/21/2023 1056   CREATININE 0.79 05/08/2020 1015   CALCIUM 9.8 02/21/2023 1056   PROT 7.1 02/07/2023 1141   ALBUMIN 4.6 02/07/2023 1141   AST 19 02/07/2023 1141   ALT 24 02/07/2023 1141   ALKPHOS 107 02/07/2023 1141   BILITOT 0.4 02/07/2023 1141   GFRNONAA >60 06/15/2022 0457       Component Value Date/Time   WBC 4.6 02/07/2023 1141   RBC 4.63 02/07/2023 1141   HGB 13.3 02/07/2023 1141   HCT 40.5 02/07/2023 1141    PLT 240.0 02/07/2023 1141   MCV 87.4 02/07/2023 1141   MCV 87.7 02/07/2013 1110   MCH 29.1 06/15/2022 0457   MCHC 32.8 02/07/2023 1141   RDW 14.1 02/07/2023 1141   LYMPHSABS 1.1 06/14/2022 1229   MONOABS 1.2 (H) 06/14/2022 1229   EOSABS 0.0 06/14/2022 1229   BASOSABS 0.0 06/14/2022 1229    No results found for: "POCLITH", "LITHIUM"   Lab Results  Component Value Date   CBMZ 5.2 02/01/2019     .res Assessment: Plan:    There are no diagnoses linked to this encounter.   Please see After Visit Summary for patient specific instructions.  Future Appointments  Date Time Provider Department Center  08/18/2023 11:00 AM [address] [facility] None  09/26/2023 10:00 AM Van Clines, MD [facility] None  09/26/2023  1:00 PM Copland, [name], MD [facility]    No orders of the defined types were placed in this encounter.   -------------------------------

## 2023-08-18 ENCOUNTER — Ambulatory Visit (INDEPENDENT_AMBULATORY_CARE_PROVIDER_SITE_OTHER): Payer: Medicare Other | Admitting: Otolaryngology

## 2023-08-18 ENCOUNTER — Encounter (INDEPENDENT_AMBULATORY_CARE_PROVIDER_SITE_OTHER): Payer: Self-pay

## 2023-08-18 VITALS — BP 138/86 | HR 96 | Resp 18 | Ht 66.0 in | Wt 182.0 lb

## 2023-08-18 DIAGNOSIS — K2100 Gastro-esophageal reflux disease with esophagitis, without bleeding: Secondary | ICD-10-CM

## 2023-08-18 DIAGNOSIS — R0982 Postnasal drip: Secondary | ICD-10-CM

## 2023-08-18 DIAGNOSIS — K219 Gastro-esophageal reflux disease without esophagitis: Secondary | ICD-10-CM

## 2023-08-18 DIAGNOSIS — R053 Chronic cough: Secondary | ICD-10-CM

## 2023-08-18 MED ORDER — FLUTICASONE PROPIONATE 50 MCG/ACT NA SUSP: 1.0000 | Freq: Every day | NASAL | 5 refills | Status: DC | PRN

## 2023-08-18 NOTE — Progress Notes (Signed)
Dear Dr. Zerita Boers, Here is my assessment for our mutual patient, Theresa [name]. Thank you for allowing me the opportunity to care for your patient. Please do not hesitate to contact me should you have any other questions. Sincerely, Dr. Jovita Kussmaul  Otolaryngology Clinic Note Referring provider: Dr. Zerita Boers HPI:  Theresa [name] is a 67 y.o. female kindly referred by Dr. Zerita Boers for evaluation of cough.   Initial (07/2023): Patient reports: started may 18 months ago, started with a dry cough, slowly getting worse and more frequent. No antecedent event such as URI or intubation. She reports that initially thought it was allergies, started on allergy meds but no significant help. Given lack of improvement, she was then seen by GI who ordered an esophagram and increased her reflux Meds. She reports that she has bumped up her reflux regimen which has helped, and she is coughing less but persists. She reports that she continues to have a dry cough, no specific triggers such as smell/perfumes/temp/pressure change. Sometimes there is a tickle. Does have xerostomia due to her psych medication use. She takes lozenges when going to start coughing, and that helps abort it. No significant throat clearing. She does not have dysphagia  Of note, She does report atrovent use and intermittent zyrtec also helps with the cough. She denies any current GERD sx but used to have epigastric burning.   She is on Losartan  Poor historian as memory has declined.  Patient otherwise denies: - dysphagia, odynophagia, aspiration of solid foods, need for Heimlich, unintentional weight loss - changes in voice, shortness of breath, hemoptysis, tobacco or significant alcohol history - ear pain, neck masses  PMHx: Bipolar, Anxiety, Insomnia  H&N Surgery: ACDF (~2022) Personal or FHx of bleeding dz or anesthesia difficulty: no  AP/AC: no  Tobacco: no. Lives in [place], [place]  Independent Review of Additional Tests or  Records:  Theresa [name] - GI (05/24/2023): Chronic cough for many years, maybe thought related to allergies and started on Zyrtec but did not change. Some concern for regurg and aspiration. Rx: Zyrtec, PPI BID and added Pepcid. Barium esopahgram for dysphagia, and ENT eval for PND Theresa [name] (04/2022): Reports cough x4 months, dry cough. Never smoker, Tried lozenges, flonase and zyrtec; continue conservative mgmt Theresa [name] 02/2023: Coughing for "almost a year", generally dry, some post nasal drip. Cough off and on all day, no specific pattern to it. On zyrtec, PPI; Thought due to PND, try atrovent, Ref to GI Esophagram 06/02/2023 was independently reviewed showing: agree with patulous GE junction with a small hiatal hernia and some reflux. Motility reported as normal but there is some evidence of dysmotility and reflux on series #5, img #[id number]. On other series, however, motility does appear normal.  PMH/Meds/All/SocHx/FamHx/ROS:   Past Medical History:  Diagnosis Date   Adenomatous colon polyp    Allergy    Anxiety    Bipolar 1 disorder (HCC)    Blood transfusion without reported diagnosis    Depression    Elevated LFTs    GERD (gastroesophageal reflux disease)    Hepatic steatosis 05/07/2013   Hyperlipidemia    Seizures (HCC) 1977   last seizure 05/2022   Tardive dyskinesia      Past Surgical History:  Procedure Laterality Date   APPENDECTOMY     COLONOSCOPY  06/16/2012   Procedure: COLONOSCOPY;  Surgeon: Hart Carwin, MD;  Location: [facility];  Service: Endoscopy;  Laterality: N/A;   DILATION AND CURETTAGE OF UTERUS     KNEE  ARTHROSCOPY WITH MENISCAL REPAIR Left    NECK SURGERY     plate with screws to 3 cervical spines    Family History  Problem Relation Age of Onset   Colon polyps Mother    Cancer Mother    Heart disease Mother    Alcoholism Mother    Hypertension Mother    Colon polyps Father    Cancer Father    Alcoholism Father    Bipolar disorder Father     Drug abuse Brother    Bipolar disorder Other    Colon cancer Neg Hx    Stomach cancer Neg Hx    Breast cancer Neg Hx    Esophageal cancer Neg Hx    Rectal cancer Neg Hx      Social Connections: Socially Isolated (02/02/2023)   Social Connection and Isolation Panel [NHANES]    Frequency of Communication with Friends and Family: Once a week    Frequency of Social Gatherings with Friends and Family: Once a week    Attends Religious Services: Never    Database administrator or Organizations: No    Attends Engineer, structural: Not on file    Marital Status: Married      Current Outpatient Medications:    ALPRAZolam (XANAX XR) 2 MG 24 hr tablet, TAKE 1 TABLET BY MOUTH IN THE  MORNING AND AT BEDTIME, Disp: 180 tablet, Rfl: 1   ALPRAZolam (XANAX) 1 MG tablet, TAKE 1 TABLET BY MOUTH DAILY AS  NEEDED FOR SEVERE ANXIETY, Disp: 30 tablet, Rfl: 5   carbamazepine (CARBATROL) 300 MG 12 hr capsule, Take 1 capsule (300 mg total) by mouth every morning AND 2 capsules (600 mg total) at bedtime., Disp: 270 capsule, Rfl: 1   cetirizine (ZYRTEC) 10 MG tablet, Take 10 mg by mouth daily as needed for allergies or rhinitis., Disp: , Rfl:    Cholecalciferol (VITAMIN D3) 1.25 MG (50000 UT) CAPS, Take 1 weekly for 12 weeks, Disp: 12 capsule, Rfl: 0   clonazePAM (KLONOPIN) 1 MG tablet, Take 1 tablet (1 mg total) by mouth at bedtime., Disp: 90 tablet, Rfl: 1   Deutetrabenazine ER (AUSTEDO XR) 48 MG TB24, Take 48 mg by mouth daily., Disp: 90 tablet, Rfl: 3   docusate sodium (COLACE) 50 MG capsule, Take 50 mg by mouth daily as needed for mild constipation., Disp: , Rfl:    famotidine (PEPCID) 40 MG tablet, Take 1 tablet (40 mg total) by mouth at bedtime., Disp: 30 tablet, Rfl: 5   ipratropium (ATROVENT) 0.06 % nasal spray, Place 2 sprays into both nostrils 3 (three) times daily. Use as needed for PND, Disp: 15 mL, Rfl: 12   levETIRAcetam (KEPPRA) 500 MG tablet, Take 1 tablet (500 mg total) by mouth 2 (two)  times daily., Disp: 200 tablet, Rfl: 3   losartan-hydrochlorothiazide (HYZAAR) 50-12.5 MG tablet, Take 1 tablet by mouth daily., Disp: 90 tablet, Rfl: 3   lovastatin (MEVACOR) 20 MG tablet, TAKE 1 TABLET BY MOUTH EVERY DAY, Disp: 90 tablet, Rfl: 3   magic mouthwash w/lidocaine SOLN, Diphenhydramine 12.5 mg/5 mL 1 part Viscous lidocaine 2% 1 part Maalox 1 part  Swish, hold, and spit or swallow 30 mL TID, Disp: 900 mL, Rfl: 1   Melatonin 10 MG TABS, Take 20 mg by mouth at bedtime., Disp: , Rfl:    metFORMIN (GLUCOPHAGE) 500 MG tablet, TAKE 1 TABLET BY MOUTH TWICE A DAY WITH MEALS, Disp: 180 tablet, Rfl: 0   Multiple Vitamins-Minerals (CENTRUM SILVER  PO), Take 1 tablet by mouth 2 (two) times a week., Disp: , Rfl:    OLANZapine (ZYPREXA) 10 MG tablet, Take 1 tablet (10 mg total) by mouth at bedtime., Disp: 90 tablet, Rfl: 1   pantoprazole (PROTONIX) 40 MG tablet, Take 1 tablet (40 mg total) by mouth 2 (two) times daily., Disp: 180 tablet, Rfl: 1   traZODone (DESYREL) 100 MG tablet, TAKE 1-2 TABLETS (100-200 MG TOTAL) BY MOUTH AT BEDTIME AS NEEDED. FOR SLEEP, Disp: 180 tablet, Rfl: 3   zolpidem (AMBIEN) 10 MG tablet, Take 1 tablet (10 mg total) by mouth at bedtime as needed., Disp: 90 tablet, Rfl: 1   fluticasone (FLONASE) 50 MCG/ACT nasal spray, Place 1 spray into both nostrils daily as needed for allergies or rhinitis., Disp: 11 mL, Rfl: 5   Physical Exam:   BP 138/86 (BP Location: Right Arm, Patient Position: Sitting, Cuff Size: Normal)   Pulse 96   Resp 18   Ht 5\' 6"  (1.676 m)   Wt 182 lb (82.6 kg)   SpO2 94%   BMI 29.38 kg/m   Salient findings:  CN II-XII intact Bilateral EAC clear and TM intact with well pneumatized middle ear spaces Anterior rhinoscopy: Septum intact; bilateral inferior turbinates without significant hypertrophy No lesions of oral cavity/oropharynx No obviously palpable neck masses/lymphadenopathy/thyromegaly No respiratory distress or stridor; voice quality class 1.5;  no significant coughing episodes on exam today; TFL was indicated to better evaluate the proximal airway, given the patient's history and exam findings, and is detailed below.   Seprately Identifiable Procedures:  Procedure Note Pre-procedure diagnosis:  Chronic cough, GERD, LPR Post-procedure diagnosis: Same Procedure: Transnasal Fiberoptic Laryngoscopy, CPT 31575 - Mod 25 Indication: chronic cough, GERD, LPR Complications: None apparent EBL: 0 mL  The procedure was undertaken to further evaluate the patient's complaint of chronic cough, GERD, LPR, with mirror exam inadequate for appropriate examination due to gag reflex and poor patient tolerance  Procedure:  Patient was identified as correct patient. Verbal consent was obtained. The nose was sprayed with oxymetazoline and 4% lidocaine. The The flexible laryngoscope was passed through the nose to view the nasal cavity, pharynx (oropharynx, hypopharynx) and larynx.  The larynx was examined at rest and during multiple phonatory tasks. Documentation was obtained and reviewed with patient. The scope was removed. The patient tolerated the procedure well.  Findings: The nasal cavity and nasopharynx did not reveal any masses or lesions, mucosa appeared to be without obvious lesions. The tongue base, pharyngeal walls, piriform sinuses, vallecula, epiglottis and postcricoid region are normal in appearance with minimal retained pyriform secretions and mild post-cricoid edema. The visualized portion of the subglottis and proximal trachea is widely patent. The vocal folds are mobile bilaterally. There are no lesions on the free edge of the vocal folds nor elsewhere in the larynx worrisome for malignancy.      Electronically signed by: Read Drivers, MD 08/21/2023 2:11 PM   Impression & Plans:  Theresa [name] is a 67 y.o. female with:  1. Chronic cough   2. Gastroesophageal reflux disease with esophagitis without hemorrhage   3. Laryngopharyngeal  reflux (LPR)   4. Post-nasal drip    TFL reassuring and cough has been helped significantly after improving reflux regimen. No dysphagia. Esophagram shows a hiatal hernia, some reflux. I do wonder about some dysmotility based on the available imaging. She does have some PND but this is minor. Medications could also contribute (Losartan can cause cough as well - less likely but still  possible). Neurogenic also a possibility but less likely currently given improvement after reflux regimen.  Ultimately, this can be multifactorial We discussed options including R/B/A and she has decided to proceed with: Can empirically try to Rx PND if contributing; will continue zyrtec; start flonase two puffs BID Continue reflux mgmt per GI - PPI BID currently and H2 blocker We discussed voice eval/rx for chronic cough and improving hydration - will try water sipping technique Discussed gabapentin if all of this fails Would touch base with GI (she will do this) to see if dysmotility could be contributing and her hiatal hernia/reflux Consider switching losartan - she'll d/w PCP  We discussed f/u - she opted for PRN given reassuring TFL and did not wish to add another med (especially given her other medical problems which is reasonable). Happy to see her if issues  See below regarding exact medications prescribed this encounter including dosages and route: Meds ordered this encounter  Medications   fluticasone (FLONASE) 50 MCG/ACT nasal spray    Sig: Place 1 spray into both nostrils daily as needed for allergies or rhinitis.    Dispense:  11 mL    Refill:  5      Thank you for allowing me the opportunity to care for your patient. Please do not hesitate to contact me should you have any other questions.  Sincerely, Jovita Kussmaul, MD Otolarynoglogist (ENT), [facility] Phone: [phone] Fax: [phone]  08/21/2023, 2:11 PM   MDM:  Level 4 Complexity/Problems addressed: mod - chronic  problem, multiple Data complexity: mod - independent review of notes, imaging (esophagram) - Morbidity: mod   - Prescription Drug prescribed or managed: yes

## 2023-09-06 ENCOUNTER — Encounter: Payer: Self-pay | Admitting: Family Medicine

## 2023-09-23 NOTE — Progress Notes (Unsigned)
[facility] [address], [address] [place], [place] [address] [phone]  Date:  09/26/2023   Name:  Theresa [name]   DOB:  08-Oct-1955   MRN:  [id number]  PCP:  Pearline Cables, MD    Chief Complaint: No chief complaint on file.   History of Present Illness:  Theresa [name] is a 68 y.o. very pleasant female patient who presents with the following:  Patient seen today for periodic follow-up-most recent visit with myself was in July when we discussed her persistent cough History of bipolar disorder and epilepsy, tardive dyskinesia, dyslipidemia -recently elevated blood pressures consistent with essential hypertension  We adjusted her blood pressure medication last year and were able to get her numbers in range I had her try Atrovent nasal for possible postnasal drainage, would also get a chest x-ray which was normal I referred her most recently about 2 weeks ago at which point she noted her cough was better with increased dose of pantoprazole and Pepcid  Flu vaccine Colon cancer screening COVID booster Mammogram up-to-date Can update bone density  Alprazolam twice daily Carbamazepine 300 a.m., 600 p.m. Austedo XR 48 Zyprexa Trazodone Ambien Lovastatin losartan/hydrochlorothiazide  We did BMP in July-additional blood work in June which showed vitamin D deficiency  Patient Active Problem List   Diagnosis Date Noted   Breakthrough seizure (HCC) 06/15/2022   Hypokalemia 06/15/2022   Hyponatremia 06/15/2022   Elevated AST (SGOT) 06/15/2022   Tongue laceration 06/15/2022   Dyslipidemia 05/05/2020   Manic bipolar I disorder in partial remission (HCC) 07/25/2018   Posttraumatic stress disorder 07/25/2018   Generalized idiopathic epilepsy and epileptic syndromes, not intractable, without status epilepticus (HCC) 06/24/2017   Tardive dyskinesia 01/21/2017   Special screening for malignant neoplasms, colon 06/16/2012    History of colonic polyps 06/16/2012   Bipolar disorder (HCC) 04/02/2012    Past Medical History:  Diagnosis Date   Adenomatous colon polyp    Allergy    Anxiety    Bipolar 1 disorder (HCC)    Blood transfusion without reported diagnosis    Depression    Elevated LFTs    GERD (gastroesophageal reflux disease)    Hepatic steatosis 05/07/2013   Hyperlipidemia    Seizures (HCC) 1977   last seizure 05/2022   Tardive dyskinesia     Past Surgical History:  Procedure Laterality Date   APPENDECTOMY     COLONOSCOPY  06/16/2012   Procedure: COLONOSCOPY;  Surgeon: Hart Carwin, MD;  Location: [facility];  Service: Endoscopy;  Laterality: N/A;   DILATION AND CURETTAGE OF UTERUS     KNEE ARTHROSCOPY WITH MENISCAL REPAIR Left    NECK SURGERY     plate with screws to 3 cervical spines    Social History   Tobacco Use   Smoking status: Never   Smokeless tobacco: Never  Vaping Use   Vaping status: Never Used  Substance Use Topics   Alcohol use: Yes    Comment: occasionaly 1 a month   Drug use: Never    Family History  Problem Relation Age of Onset   Colon polyps Mother    Cancer Mother    Heart disease Mother    Alcoholism Mother    Hypertension Mother    Colon polyps Father    Cancer Father    Alcoholism Father    Bipolar disorder Father    Drug abuse Brother    Bipolar disorder Other  Colon cancer Neg Hx    Stomach cancer Neg Hx    Breast cancer Neg Hx    Esophageal cancer Neg Hx    Rectal cancer Neg Hx     Allergies  Allergen Reactions   Cyclobenzaprine Other (See Comments)    Blisters in mouth   Lamictal [Lamotrigine]     Acute renal failure   Lybalvi [Olanzapine-Samidorphan] Other (See Comments)    Nausea, dizziness   Ropinirole Nausea And Vomiting   Tramadol Other (See Comments)    Reaction??   Acetaminophen Itching, Swelling and Rash   Codeine Swelling, Rash and Other (See Comments)    Can take hydrocodone   Penicillins     Abdominal pain     Medication list has been reviewed and updated.  Current Outpatient Medications on File Prior to Visit  Medication Sig Dispense Refill   ALPRAZolam (XANAX XR) 2 MG 24 hr tablet TAKE 1 TABLET BY MOUTH IN THE  MORNING AND AT BEDTIME 180 tablet 1   ALPRAZolam (XANAX) 1 MG tablet TAKE 1 TABLET BY MOUTH DAILY AS  NEEDED FOR SEVERE ANXIETY 30 tablet 5   carbamazepine (CARBATROL) 300 MG 12 hr capsule Take 1 capsule (300 mg total) by mouth every morning AND 2 capsules (600 mg total) at bedtime. 270 capsule 1   cetirizine (ZYRTEC) 10 MG tablet Take 10 mg by mouth daily as needed for allergies or rhinitis.     Cholecalciferol (VITAMIN D3) 1.25 MG (50000 UT) CAPS Take 1 weekly for 12 weeks 12 capsule 0   clonazePAM (KLONOPIN) 1 MG tablet Take 1 tablet (1 mg total) by mouth at bedtime. 90 tablet 1   Deutetrabenazine ER (AUSTEDO XR) 48 MG TB24 Take 48 mg by mouth daily. 90 tablet 3   docusate sodium (COLACE) 50 MG capsule Take 50 mg by mouth daily as needed for mild constipation.     famotidine (PEPCID) 40 MG tablet Take 1 tablet (40 mg total) by mouth at bedtime. 30 tablet 5   fluticasone (FLONASE) 50 MCG/ACT nasal spray Place 1 spray into both nostrils daily as needed for allergies or rhinitis. 11 mL 5   ipratropium (ATROVENT) 0.06 % nasal spray Place 2 sprays into both nostrils 3 (three) times daily. Use as needed for PND 15 mL 12   levETIRAcetam (KEPPRA) 500 MG tablet Take 1 tablet (500 mg total) by mouth 2 (two) times daily. 200 tablet 3   losartan-hydrochlorothiazide (HYZAAR) 50-12.5 MG tablet Take 1 tablet by mouth daily. 90 tablet 3   lovastatin (MEVACOR) 20 MG tablet TAKE 1 TABLET BY MOUTH EVERY DAY 90 tablet 3   magic mouthwash w/lidocaine SOLN Diphenhydramine 12.5 mg/5 mL 1 part Viscous lidocaine 2% 1 part Maalox 1 part  Swish, hold, and spit or swallow 30 mL TID 900 mL 1   Melatonin 10 MG TABS Take 20 mg by mouth at bedtime.     metFORMIN (GLUCOPHAGE) 500 MG tablet TAKE 1 TABLET BY MOUTH  TWICE A DAY WITH MEALS 180 tablet 0   Multiple Vitamins-Minerals (CENTRUM SILVER PO) Take 1 tablet by mouth 2 (two) times a week.     OLANZapine (ZYPREXA) 10 MG tablet Take 1 tablet (10 mg total) by mouth at bedtime. 90 tablet 1   pantoprazole (PROTONIX) 40 MG tablet Take 1 tablet (40 mg total) by mouth 2 (two) times daily. 180 tablet 1   traZODone (DESYREL) 100 MG tablet TAKE 1-2 TABLETS (100-200 MG TOTAL) BY MOUTH AT BEDTIME AS NEEDED. FOR SLEEP 180  tablet 3   zolpidem (AMBIEN) 10 MG tablet Take 1 tablet (10 mg total) by mouth at bedtime as needed. 90 tablet 1   No current facility-administered medications on file prior to visit.    Review of Systems:  As per HPI- otherwise negative.   Physical Examination: There were no vitals filed for this visit. There were no vitals filed for this visit. There is no height or weight on file to calculate BMI. Ideal Body Weight:    GEN: no acute distress. HEENT: Atraumatic, Normocephalic.  Ears and Nose: No external deformity. CV: RRR, No M/G/R. No JVD. No thrill. No extra heart sounds. PULM: CTA B, no wheezes, crackles, rhonchi. No retractions. No resp. distress. No accessory muscle use. ABD: S, NT, ND, +BS. No rebound. No HSM. EXTR: No c/c/e PSYCH: Normally interactive. Conversant.    Assessment and Plan: ***  Signed Abbe Amsterdam, MD

## 2023-09-23 NOTE — Patient Instructions (Incomplete)
It was great to see you again today!  

## 2023-09-26 ENCOUNTER — Encounter: Payer: Self-pay | Admitting: Neurology

## 2023-09-26 ENCOUNTER — Encounter: Payer: Self-pay | Admitting: Family Medicine

## 2023-09-26 ENCOUNTER — Ambulatory Visit (INDEPENDENT_AMBULATORY_CARE_PROVIDER_SITE_OTHER): Payer: Medicare Other | Admitting: Family Medicine

## 2023-09-26 ENCOUNTER — Ambulatory Visit: Payer: Medicare Other | Admitting: Neurology

## 2023-09-26 VITALS — BP 130/76 | HR 101 | Ht 66.0 in | Wt 185.8 lb

## 2023-09-26 VITALS — BP 128/72 | HR 94 | Temp 97.7°F | Resp 18 | Ht 66.0 in | Wt 185.0 lb

## 2023-09-26 DIAGNOSIS — R053 Chronic cough: Secondary | ICD-10-CM | POA: Diagnosis not present

## 2023-09-26 DIAGNOSIS — R413 Other amnesia: Secondary | ICD-10-CM

## 2023-09-26 DIAGNOSIS — Z131 Encounter for screening for diabetes mellitus: Secondary | ICD-10-CM | POA: Diagnosis not present

## 2023-09-26 DIAGNOSIS — R400 Somnolence: Secondary | ICD-10-CM

## 2023-09-26 DIAGNOSIS — E2839 Other primary ovarian failure: Secondary | ICD-10-CM

## 2023-09-26 DIAGNOSIS — E785 Hyperlipidemia, unspecified: Secondary | ICD-10-CM | POA: Diagnosis not present

## 2023-09-26 DIAGNOSIS — K219 Gastro-esophageal reflux disease without esophagitis: Secondary | ICD-10-CM

## 2023-09-26 DIAGNOSIS — G40309 Generalized idiopathic epilepsy and epileptic syndromes, not intractable, without status epilepticus: Secondary | ICD-10-CM

## 2023-09-26 DIAGNOSIS — I10 Essential (primary) hypertension: Secondary | ICD-10-CM

## 2023-09-26 DIAGNOSIS — E559 Vitamin D deficiency, unspecified: Secondary | ICD-10-CM | POA: Diagnosis not present

## 2023-09-26 DIAGNOSIS — E663 Overweight: Secondary | ICD-10-CM

## 2023-09-26 DIAGNOSIS — Z5181 Encounter for therapeutic drug level monitoring: Secondary | ICD-10-CM

## 2023-09-26 MED ORDER — LEVETIRACETAM 500 MG PO TABS
ORAL_TABLET | ORAL | 3 refills | Status: DC
Start: 2023-09-26 — End: 2024-04-20

## 2023-09-26 NOTE — Patient Instructions (Addendum)
Always good to see you.  Have bloodwork done with PCP for B12  2. Schedule brain MRI with and without contrast  3. Schedule home sleep study  4. Increase Levetiracetam (Keppra) 500mg : Take 1 tablet in AM, 1 and 1/2 tablets in PM  5. Continue Carbatrol 300mg : take 1 cap in AM, 2 caps in PM  6. Follow-up in 6 months or earlier if needed, call for any changes   Seizure Precautions: 1. If medication has been prescribed for you to prevent seizures, take it exactly as directed.  Do not stop taking the medicine without talking to your doctor first, even if you have not had a seizure in a long time.   2. Avoid activities in which a seizure would cause danger to yourself or to others.  Don't operate dangerous machinery, swim alone, or climb in high or dangerous places, such as on ladders, roofs, or girders.  Do not drive unless your doctor says you may.  3. If you have any warning that you may have a seizure, lay down in a safe place where you can't hurt yourself.    4.  No driving for 6 months from last seizure, as per [facility].   Please refer to the following link on the Epilepsy Foundation of America's website for more information: http://www.epilepsyfoundation.org/answerplace/Social/driving/drivingu.cfm   5.  Maintain good sleep hygiene.  6.  Contact your doctor if you have any problems that may be related to the medicine you are taking.  7.  Call 911 and bring the patient back to the ED if:        A.  The seizure lasts longer than 5 minutes.       B.  The patient doesn't awaken shortly after the seizure  C.  The patient has new problems such as difficulty seeing, speaking or moving  D.  The patient was injured during the seizure  E.  The patient has a temperature over 102 F (39C)  F.  The patient vomited and now is having trouble breathing
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
## 2023-09-27 ENCOUNTER — Encounter: Payer: Self-pay | Admitting: Family Medicine

## 2023-09-27 DIAGNOSIS — E538 Deficiency of other specified B group vitamins: Secondary | ICD-10-CM | POA: Insufficient documentation

## 2023-09-27 LAB — CBC
HCT: 40.8 % (ref 36.0–46.0)
Hemoglobin: 13.6 g/dL (ref 12.0–15.0)
MCHC: 33.3 g/dL (ref 30.0–36.0)
MCV: 88.1 fL (ref 78.0–100.0)
Platelets: 290 10*3/uL (ref 150.0–400.0)
RBC: 4.63 Mil/uL (ref 3.87–5.11)
RDW: 14.3 % (ref 11.5–15.5)
WBC: 6.9 10*3/uL (ref 4.0–10.5)

## 2023-09-27 LAB — COMPREHENSIVE METABOLIC PANEL
ALT: 23 U/L (ref 0–35)
AST: 21 U/L (ref 0–37)
Albumin: 4.7 g/dL (ref 3.5–5.2)
Alkaline Phosphatase: 104 U/L (ref 39–117)
BUN: 16 mg/dL (ref 6–23)
CO2: 30 meq/L (ref 19–32)
Calcium: 9.8 mg/dL (ref 8.4–10.5)
Chloride: 97 meq/L (ref 96–112)
Creatinine, Ser: 0.91 mg/dL (ref 0.40–1.20)
GFR: 65.29 mL/min (ref >60.00)
Glucose, Bld: 96 mg/dL (ref 70–99)
Potassium: 5.1 meq/L (ref 3.5–5.1)
Sodium: 140 meq/L (ref 135–145)
Total Bilirubin: 0.4 mg/dL (ref 0.2–1.2)
Total Protein: 7.2 g/dL (ref 6.0–8.3)

## 2023-09-27 LAB — VITAMIN D 25 HYDROXY (VIT D DEFICIENCY, FRACTURES): VITD: 38.98 ng/mL (ref 30.00–100.00)

## 2023-09-27 LAB — VITAMIN B12: Vitamin B-12: 140 pg/mL — ABNORMAL LOW (ref 211–911)

## 2023-09-27 LAB — HEMOGLOBIN A1C: Hgb A1c MFr Bld: 6.1 % (ref 4.6–6.5)

## 2023-09-29 ENCOUNTER — Ambulatory Visit: Payer: Medicare Other | Admitting: Gastroenterology

## 2023-09-29 ENCOUNTER — Encounter: Payer: Self-pay | Admitting: Family Medicine

## 2023-10-14 ENCOUNTER — Ambulatory Visit: Payer: Medicare Other | Admitting: *Deleted

## 2023-10-14 VITALS — BP 150/95 | HR 78 | Ht 66.0 in | Wt 192.8 lb

## 2023-10-14 DIAGNOSIS — Z0000 Encounter for general adult medical examination without abnormal findings: Secondary | ICD-10-CM

## 2023-10-14 NOTE — Progress Notes (Signed)
 Subjective:   Theresa [name] is a 68 y.o. female who presents for Medicare Annual (Subsequent) preventive examination.  Visit Complete: In person  Cardiac Risk Factors include: advanced age (>72men, >41 women);dyslipidemia;hypertension;obesity (BMI >30kg/m2)     Objective:    Today's Vitals   10/14/23 1307 10/14/23 1331  BP: (!) 150/87 (!) 150/95  Pulse: 79 78  SpO2: 97%   Weight: 192 lb 12.8 oz (87.5 kg)   Height: 5\' 6"  (1.676 m)    Body mass index is 31.12 kg/m.     10/14/2023    1:24 PM 09/26/2023   10:09 AM 09/30/2022   10:10 AM 06/14/2022    7:30 PM 06/14/2022   11:00 AM 05/27/2022    3:20 PM 09/29/2021    3:32 PM  Advanced Directives  Does Patient Have a Medical Advance Directive? Yes Yes Yes Yes Unable to assess, patient is non-responsive or altered mental status Yes Yes  Type of Advance Directive Healthcare Power of [place];Living will Healthcare Power of [place];Living will;Out of facility DNR (pink MOST or yellow form) Healthcare Power of [place];Living will;Out of facility DNR (pink MOST or yellow form) Healthcare Power of [place];Living will  Healthcare Power of [place];Living will   Does patient want to make changes to medical advance directive? No - Patient declined   No - Patient declined  No - Patient declined   Copy of Healthcare Power of Attorney in Chart? No - copy requested   No - copy requested  No - copy requested     Current Medications (verified) Outpatient Encounter Medications as of 10/14/2023  Medication Sig   ALPRAZolam (XANAX XR) 2 MG 24 hr tablet TAKE 1 TABLET BY MOUTH IN THE  MORNING AND AT BEDTIME   ALPRAZolam (XANAX) 1 MG tablet TAKE 1 TABLET BY MOUTH DAILY AS  NEEDED FOR SEVERE ANXIETY   carbamazepine (CARBATROL) 300 MG 12 hr capsule Take 1 capsule (300 mg total) by mouth every morning AND 2 capsules (600 mg total) at bedtime.   cetirizine (ZYRTEC) 10 MG tablet Take 10 mg by mouth daily as needed for allergies or rhinitis.    Cholecalciferol (VITAMIN D3) 1.25 MG (50000 UT) CAPS Take 1 weekly for 12 weeks   clonazePAM (KLONOPIN) 1 MG tablet Take 1 tablet (1 mg total) by mouth at bedtime.   Deutetrabenazine ER (AUSTEDO XR) 48 MG TB24 Take 48 mg by mouth daily.   docusate sodium (COLACE) 50 MG capsule Take 50 mg by mouth daily as needed for mild constipation.   famotidine (PEPCID) 40 MG tablet Take 1 tablet (40 mg total) by mouth at bedtime.   fluticasone (FLONASE) 50 MCG/ACT nasal spray Place 1 spray into both nostrils daily as needed for allergies or rhinitis.   ipratropium (ATROVENT) 0.06 % nasal spray Place 2 sprays into both nostrils 3 (three) times daily. Use as needed for PND   levETIRAcetam (KEPPRA) 500 MG tablet Take 1 tablet in AM, 1 and 1/2 tablets in PM   losartan-hydrochlorothiazide (HYZAAR) 50-12.5 MG tablet Take 1 tablet by mouth daily.   lovastatin (MEVACOR) 20 MG tablet TAKE 1 TABLET BY MOUTH EVERY DAY   magic mouthwash w/lidocaine SOLN Diphenhydramine 12.5 mg/5 mL 1 part Viscous lidocaine 2% 1 part Maalox 1 part  Swish, hold, and spit or swallow 30 mL TID   Melatonin 10 MG TABS Take 20 mg by mouth at bedtime.   metFORMIN (GLUCOPHAGE) 500 MG tablet TAKE 1 TABLET BY MOUTH TWICE A DAY WITH MEALS   Multiple  Vitamins-Minerals (CENTRUM SILVER PO) Take 1 tablet by mouth 2 (two) times a week.   OLANZapine (ZYPREXA) 10 MG tablet Take 1 tablet (10 mg total) by mouth at bedtime.   pantoprazole (PROTONIX) 40 MG tablet Take 1 tablet (40 mg total) by mouth 2 (two) times daily.   polyethylene glycol (MIRALAX / GLYCOLAX) 17 g packet Take 17 g by mouth daily.   traZODone (DESYREL) 100 MG tablet TAKE 1-2 TABLETS (100-200 MG TOTAL) BY MOUTH AT BEDTIME AS NEEDED. FOR SLEEP   zolpidem (AMBIEN) 10 MG tablet Take 1 tablet (10 mg total) by mouth at bedtime as needed.   No facility-administered encounter medications on file as of 10/14/2023.    Allergies (verified) Cyclobenzaprine, Lamictal [lamotrigine], Lybalvi  [olanzapine-samidorphan], Ropinirole, Tramadol, Acetaminophen, Codeine, and Penicillins   History: Past Medical History:  Diagnosis Date   Adenomatous colon polyp    Allergy    Anxiety    Bipolar 1 disorder (HCC)    Blood transfusion without reported diagnosis    Cancer (HCC) years ago   a skin basil cell bump   Depression    Elevated LFTs    GERD (gastroesophageal reflux disease)    Hepatic steatosis 05/07/2013   Hyperlipidemia    Hypertension    Seizures (HCC) 1977   last seizure 05/2022   Tardive dyskinesia    Past Surgical History:  Procedure Laterality Date   APPENDECTOMY     COLONOSCOPY  06/16/2012   Procedure: COLONOSCOPY;  Surgeon: Hart Carwin, MD;  Location: [facility];  Service: Endoscopy;  Laterality: N/A;   DILATION AND CURETTAGE OF UTERUS     EYE SURGERY     KNEE ARTHROSCOPY WITH MENISCAL REPAIR Left    NECK SURGERY     plate with screws to 3 cervical spines   SPINE SURGERY     Family History  Problem Relation Age of Onset   Colon polyps Mother    Cancer Mother    Heart disease Mother    Alcoholism Mother    Hypertension Mother    Alcohol abuse Mother    Early death Mother    Colon polyps Father    Cancer Father    Alcoholism Father    Bipolar disorder Father    Alcohol abuse Father    Drug abuse Brother    Alcohol abuse Brother    Depression Brother    Bipolar disorder Other    Alcohol abuse Sister    Drug abuse Sister    Colon cancer Neg Hx    Stomach cancer Neg Hx    Breast cancer Neg Hx    Esophageal cancer Neg Hx    Rectal cancer Neg Hx    Social History   Socioeconomic History   Marital status: Married    Spouse name: Not on file   Number of children: Not on file   Years of education: Not on file   Highest education level: Bachelor's degree (e.g., BA, AB, BS)  Occupational History   Not on file  Tobacco Use   Smoking status: Never   Smokeless tobacco: Never  Vaping Use   Vaping status: Never Used  Substance and Sexual  Activity   Alcohol use: Yes    Comment: Maybe several times per year   Drug use: Never   Sexual activity: Not Currently    Birth control/protection: Post-menopausal  Other Topics Concern   Not on file  Social History Narrative   Lives in 2 story home with her husband  Has 2 adult children   Radiographer, 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    Social Drivers of Corporate investment banker Strain: Low Risk  (10/10/2023)   Overall Financial Resource Strain (CARDIA)    Difficulty of Paying Living Expenses: Not very hard  Food Insecurity: No Food Insecurity (10/10/2023)   Hunger Vital Sign    Worried About Running Out of Food in the Last Year: Never true    Ran Out of Food in the Last Year: Never true  Transportation Needs: No Transportation Needs (10/10/2023)   PRAPARE - Administrator, Civil Service (Medical): No    Lack of Transportation (Non-Medical): No  Physical Activity: Sufficiently Active (10/10/2023)   Exercise Vital Sign    Days of Exercise per Week: 5 days    Minutes of Exercise per Session: 30 min  Stress: No Stress Concern Present (10/10/2023)   [employer] of Occupational Health - Occupational Stress Questionnaire    Feeling of Stress : Not at all  Social Connections: Socially Isolated (10/10/2023)   Social Connection and Isolation Panel [NHANES]    Frequency of Communication with Friends and Family: Never    Frequency of Social Gatherings with Friends and Family: Once a week    Attends Religious Services: Never    Database administrator or Organizations: No    Attends Engineer, structural: Not on file    Marital Status: Married    Tobacco Counseling Counseling given: Not Answered   Clinical Intake:  Pre-visit preparation completed: Yes  Pain : No/denies pain  BMI - recorded: 31.12 Nutritional Status: BMI > 30  Obese Nutritional Risks: None Diabetes: No  How often do you need to have someone help you when  you read instructions, pamphlets, or other written materials from your doctor or pharmacy?: 1 - Never  Interpreter Needed?: No  Information entered by :: [employer], CMA   Activities of Daily Living    10/14/2023    1:15 PM  In your present state of health, do you have any difficulty performing the following activities:  Hearing? 0  Vision? 0  Comment just wears readers  Difficulty concentrating or making decisions? 1  Walking or climbing stairs? 0  Dressing or bathing? 0  Doing errands, shopping? 1  Comment husband Insurance claims handler and eating ? Y  Comment preparing meals  Using the Toilet? N  In the past six months, have you accidently leaked urine? N  Do you have problems with loss of bowel control? N  Managing your Medications? N  Managing your Finances? Y  Housekeeping or managing your Housekeeping? Y    Patient Care Team: Copland, [name], MD as PCP - General (Family Medicine) Van Clines, MD as Consulting Physician (Neurology)  Indicate any recent Medical Services you may have received from other than [facility] providers in the past year (date may be approximate).     Assessment:   This is a routine wellness examination for Makayle.  Hearing/Vision screen No results found.   Goals Addressed   None    Depression Screen    10/14/2023    1:25 PM 05/27/2022    3:09 PM 05/17/2022   11:31 AM 02/28/2017    1:38 PM 09/19/2015    9:35 AM 06/05/2015    7:07 PM 03/10/2015    9:35 AM  PHQ 2/9 Scores  PHQ - 2 Score 0 0 1 0 0  0 0    Fall Risk    10/14/2023    1:21 PM 09/26/2023   10:09 AM 09/30/2022   10:10 AM 05/27/2022    3:09 PM 05/17/2022   11:31 AM  Fall Risk   Falls in the past year? 1 1 1 1 1   Number falls in past yr: 1 1 1 1 1   Injury with Fall? 1 0 0 0 0  Risk for fall due to : History of fall(s)   History of fall(s)   Follow up Falls evaluation completed Falls evaluation completed Falls evaluation completed Falls evaluation completed Education  provided    MEDICARE RISK AT HOME: Medicare Risk at Home Any stairs in or around the home?: Yes If so, are there any without handrails?: No Home free of loose throw rugs in walkways, pet beds, electrical cords, etc?: Yes Adequate lighting in your home to reduce risk of falls?: Yes Life alert?: No Use of a cane, walker or w/c?: No Grab bars in the bathroom?: No Shower chair or bench in shower?: Yes Elevated toilet seat or a handicapped toilet?: No  TIMED UP AND GO:  Was the test performed?  Yes  Length of time to ambulate 10 feet: 7 sec Gait steady and fast without use of assistive device    Cognitive Function:    09/26/2023   10:00 AM 02/28/2017    1:38 PM  MMSE - Mini Mental State Exam  Orientation to time 5 5  Orientation to Place 5 5  Registration 3 3  Attention/ Calculation 5 5  Recall 2 2  Language- name 2 objects 2 2  Language- repeat 1 1  Language- follow 3 step command 3 3  Language- read & follow direction 1 1  Write a sentence 1 1  Copy design 1 1  Total score 29 29        10/14/2023    1:26 PM 05/27/2022    3:13 PM  6CIT Screen  What Year? 0 points 0 points  What month? 0 points 0 points  What time? 0 points 0 points  Count back from 20 2 points 0 points  Months in reverse 0 points 0 points  Repeat phrase 0 points 4 points  Total Score 2 points 4 points    Immunizations Immunization History  Administered Date(s) Administered   Fluad Quad(high Dose 65+) 05/14/2021, 05/17/2022   Hepatitis B 05/16/2007, 06/16/2007   Influenza, Seasonal, Injecte, Preservative Fre 08/19/2018   Influenza,inj,Quad PF,6+ Mos 08/19/2018, 04/05/2019, 05/08/2020   Influenza-Unspecified 08/19/2018, 04/09/2019, 05/24/2023   Moderna SARS-COV2 Booster Vaccination 12/04/2020   Moderna Sars-Covid-2 Vaccination 11/14/2019, 12/12/2019   PNEUMOCOCCAL CONJUGATE-20 05/17/2022   Pfizer Covid-19 Vaccine Bivalent Booster 37yrs & up 05/14/2021   Td 08/23/1994, 09/24/1999, 02/28/2017    Tdap 05/16/2007   Zoster Recombinant(Shingrix) 05/08/2020, 07/10/2020    TDAP status: Up to date  Flu Vaccine status: Up to date  Pneumococcal vaccine status: Up to date  Covid-19 vaccine status: Information provided on how to obtain vaccines.   Qualifies for Shingles Vaccine? Yes   Zostavax completed No   Shingrix Completed?: Yes  Screening Tests Health Maintenance  Topic Date Due   COVID-19 Vaccine (5 - 2024-25 season) 04/24/2023   Medicare Annual Wellness (AWV)  05/28/2023   MAMMOGRAM  07/07/2025   Fecal DNA (Cologuard)  07/09/2025   DTaP/Tdap/Td (5 - Td or Tdap) 03/01/2027   Pneumonia Vaccine 25+ Years old  Completed   INFLUENZA VACCINE  Completed   DEXA SCAN  Completed   Hepatitis C Screening  Completed   Zoster Vaccines- Shingrix  Completed   HPV VACCINES  Aged Out   Colonoscopy  Discontinued    Health Maintenance  Health Maintenance Due  Topic Date Due   COVID-19 Vaccine (5 - 2024-25 season) 04/24/2023   Medicare Annual Wellness (AWV)  05/28/2023    Colorectal cancer screening: Type of screening: Cologuard. Completed 07/09/22. Repeat every 3 years  Mammogram status: Completed 07/08/23. Repeat every year  Bone Density status: Completed 01/12/21. Results reflect: Bone density results: NORMAL. Repeat every 2 years.  Next dexa scheduled for 01/26/24  Lung Cancer Screening: (Low Dose CT Chest recommended if Age 38-80 years, 20 pack-year currently smoking OR have quit w/in 15years.) does not qualify.   Additional Screening:  Hepatitis C Screening: does qualify; Completed 04/09/13  Vision Screening: Recommended annual ophthalmology exams for early detection of glaucoma and other disorders of the eye. Is the patient up to date with their annual eye exam?  Yes  Who is the provider or what is the name of the office in which the patient attends annual eye exams? Dr. Dione Booze If pt is not established with a provider, would they like to be referred to a provider to  establish care? No .   Dental Screening: Recommended annual dental exams for proper oral hygiene  Diabetic Foot Exam: N/a  Community Resource Referral / Chronic Care Management: CRR required this visit?  No   CCM required this visit?  No     Plan:     I have personally reviewed and noted the following in the patient's chart:   Medical and social history Use of alcohol, tobacco or illicit drugs  Current medications and supplements including opioid prescriptions. Patient is not currently taking opioid prescriptions. Functional ability and status Nutritional status Physical activity Advanced directives List of other physicians Hospitalizations, surgeries, and ER visits in previous 12 months Vitals Screenings to include cognitive, depression, and falls Referrals and appointments  In addition, I have reviewed and discussed with patient certain preventive protocols, quality metrics, and best practice recommendations. A written personalized care plan for preventive services as well as general preventive health recommendations were provided to patient.     Donne Anon, CMA   10/14/2023   After Visit Summary: (In Person-Declined) Patient declined AVS at this time.  Nurse Notes: None

## 2023-10-14 NOTE — Patient Instructions (Signed)
 Theresa [name] , Thank you for taking time to come for your Medicare Wellness Visit. I appreciate your ongoing commitment to your health goals. Please review the following plan we discussed and let me know if I can assist you in the future.     This is a list of the screening recommended for you and due dates:  Health Maintenance  Topic Date Due   COVID-19 Vaccine (5 - 2024-25 season) 04/24/2023   Medicare Annual Wellness Visit  10/13/2024   Mammogram  07/07/2025   Cologuard (Stool DNA test)  07/09/2025   DTaP/Tdap/Td vaccine (5 - Td or Tdap) 03/01/2027   Pneumonia Vaccine  Completed   Flu Shot  Completed   DEXA scan (bone density measurement)  Completed   Hepatitis C Screening  Completed   Zoster (Shingles) Vaccine  Completed   HPV Vaccine  Aged Out   Colon Cancer Screening  Discontinued    Next appointment: Follow up in one year for your annual wellness visit.   Preventive Care 68 Years and Older, Female Preventive care refers to lifestyle choices and visits with your health care provider that can promote health and wellness. What does preventive care include? A yearly physical exam. This is also called an annual well check. Dental exams once or twice a year. Routine eye exams. Ask your health care provider how often you should have your eyes checked. Personal lifestyle choices, including: Daily care of your teeth and gums. Regular physical activity. Eating a healthy diet. Avoiding tobacco and drug use. Limiting alcohol use. Practicing safe sex. Taking low-dose aspirin every day. Taking vitamin and mineral supplements as recommended by your health care provider. What happens during an annual well check? The services and screenings done by your health care provider during your annual well check will depend on your age, overall health, lifestyle risk factors, and family history of disease. Counseling  Your health care provider may ask you questions about your: Alcohol  use. Tobacco use. Drug use. Emotional well-being. Home and relationship well-being. Sexual activity. Eating habits. History of falls. Memory and ability to understand (cognition). Work and work Astronomer. Reproductive health. Screening  You may have the following tests or measurements: Height, weight, and BMI. Blood pressure. Lipid and cholesterol levels. These may be checked every 5 years, or more frequently if you are over 51 years old. Skin check. Lung cancer screening. You may have this screening every year starting at age 11 if you have a 30-pack-year history of smoking and currently smoke or have quit within the past 15 years. Fecal occult blood test (FOBT) of the stool. You may have this test every year starting at age 51. Flexible sigmoidoscopy or colonoscopy. You may have a sigmoidoscopy every 5 years or a colonoscopy every 10 years starting at age 75. Hepatitis C blood test. Hepatitis B blood test. Sexually transmitted disease (STD) testing. Diabetes screening. This is done by checking your blood sugar (glucose) after you have not eaten for a while (fasting). You may have this done every 1-3 years. Bone density scan. This is done to screen for osteoporosis. You may have this done starting at age 11. Mammogram. This may be done every 1-2 years. Talk to your health care provider about how often you should have regular mammograms. Talk with your health care provider about your test results, treatment options, and if necessary, the need for more tests. Vaccines  Your health care provider may recommend certain vaccines, such as: Influenza vaccine. This is recommended every year.  Tetanus, diphtheria, and acellular pertussis (Tdap, Td) vaccine. You may need a Td booster every 10 years. Zoster vaccine. You may need this after age 28. Pneumococcal 13-valent conjugate (PCV13) vaccine. One dose is recommended after age 65. Pneumococcal polysaccharide (PPSV23) vaccine. One dose is  recommended after age 60. Talk to your health care provider about which screenings and vaccines you need and how often you need them. This information is not intended to replace advice given to you by your health care provider. Make sure you discuss any questions you have with your health care provider. Document Released: 09/05/2015 Document Revised: 04/28/2016 Document Reviewed: 06/10/2015 Elsevier Interactive Patient Education  2017 [employer].  Fall Prevention in the Home Falls can cause injuries. They can happen to people of all ages. There are many things you can do to make your home safe and to help prevent falls. What can I do on the outside of my home? Regularly fix the edges of walkways and driveways and fix any cracks. Remove anything that might make you trip as you walk through a door, such as a raised step or threshold. Trim any bushes or trees on the path to your home. Use bright outdoor lighting. Clear any walking paths of anything that might make someone trip, such as rocks or tools. Regularly check to see if handrails are loose or broken. Make sure that both sides of any steps have handrails. Any raised decks and porches should have guardrails on the edges. Have any leaves, snow, or ice cleared regularly. Use sand or salt on walking paths during winter. Clean up any spills in your garage right away. This includes oil or grease spills. What can I do in the bathroom? Use night lights. Install grab bars by the toilet and in the tub and shower. Do not use towel bars as grab bars. Use non-skid mats or decals in the tub or shower. If you need to sit down in the shower, use a plastic, non-slip stool. Keep the floor dry. Clean up any water that spills on the floor as soon as it happens. Remove soap buildup in the tub or shower regularly. Attach bath mats securely with double-sided non-slip rug tape. Do not have throw rugs and other things on the floor that can make you  trip. What can I do in the bedroom? Use night lights. Make sure that you have a light by your bed that is easy to reach. Do not use any sheets or blankets that are too big for your bed. They should not hang down onto the floor. Have a firm chair that has side arms. You can use this for support while you get dressed. Do not have throw rugs and other things on the floor that can make you trip. What can I do in the kitchen? Clean up any spills right away. Avoid walking on wet floors. Keep items that you use a lot in easy-to-reach places. If you need to reach something above you, use a strong step stool that has a grab bar. Keep electrical cords out of the way. Do not use floor polish or wax that makes floors slippery. If you must use wax, use non-skid floor wax. Do not have throw rugs and other things on the floor that can make you trip. What can I do with my stairs? Do not leave any items on the stairs. Make sure that there are handrails on both sides of the stairs and use them. Fix handrails that are broken or loose.  Make sure that handrails are as long as the stairways. Check any carpeting to make sure that it is firmly attached to the stairs. Fix any carpet that is loose or worn. Avoid having throw rugs at the top or bottom of the stairs. If you do have throw rugs, attach them to the floor with carpet tape. Make sure that you have a light switch at the top of the stairs and the bottom of the stairs. If you do not have them, ask someone to add them for you. What else can I do to help prevent falls? Wear shoes that: Do not have high heels. Have rubber bottoms. Are comfortable and fit you well. Are closed at the toe. Do not wear sandals. If you use a stepladder: Make sure that it is fully opened. Do not climb a closed stepladder. Make sure that both sides of the stepladder are locked into place. Ask someone to hold it for you, if possible. Clearly mark and make sure that you can  see: Any grab bars or handrails. First and last steps. Where the edge of each step is. Use tools that help you move around (mobility aids) if they are needed. These include: Canes. Walkers. Scooters. Crutches. Turn on the lights when you go into a dark area. Replace any light bulbs as soon as they burn out. Set up your furniture so you have a clear path. Avoid moving your furniture around. If any of your floors are uneven, fix them. If there are any pets around you, be aware of where they are. Review your medicines with your doctor. Some medicines can make you feel dizzy. This can increase your chance of falling. Ask your doctor what other things that you can do to help prevent falls. This information is not intended to replace advice given to you by your health care provider. Make sure you discuss any questions you have with your health care provider. Document Released: 06/05/2009 Document Revised: 01/15/2016 Document Reviewed: 09/13/2014 Elsevier Interactive Patient Education  2017 [employer].

## 2023-10-24 ENCOUNTER — Encounter: Payer: Self-pay | Admitting: Family Medicine

## 2023-10-28 ENCOUNTER — Ambulatory Visit
Admission: RE | Admit: 2023-10-28 | Discharge: 2023-10-28 | Disposition: A | Discharge status: Home / Self Care | Payer: Medicare Other | Source: Ambulatory Visit | Attending: Neurology | Admitting: Neurology

## 2023-10-28 DIAGNOSIS — G40309 Generalized idiopathic epilepsy and epileptic syndromes, not intractable, without status epilepticus: Secondary | ICD-10-CM

## 2023-10-28 DIAGNOSIS — R413 Other amnesia: Secondary | ICD-10-CM

## 2023-10-28 MED ORDER — GADOPICLENOL 0.5 MMOL/ML IV SOLN
9.0000 mL | Freq: Once | INTRAVENOUS | Status: AC | PRN
  Administered 2023-10-28: 9 mL via INTRAVENOUS

## 2023-11-03 ENCOUNTER — Encounter: Payer: Self-pay | Admitting: Family Medicine

## 2023-11-03 ENCOUNTER — Telehealth: Payer: Self-pay

## 2023-11-03 NOTE — Telephone Encounter (Signed)
 Message sent to the pt via mychart with appointment time/date.

## 2023-11-03 NOTE — Telephone Encounter (Signed)
 Copied from CRM [id number]. Topic: Clinical - Request for Lab/Test Order >> Nov 03, 2023 10:48 AM Deaijah H wrote: Reason for CRM: Patient husband called in and stated shw would like a B12 test done.

## 2023-11-07 ENCOUNTER — Encounter: Payer: Self-pay | Admitting: Family Medicine

## 2023-11-07 ENCOUNTER — Other Ambulatory Visit (INDEPENDENT_AMBULATORY_CARE_PROVIDER_SITE_OTHER)

## 2023-11-07 DIAGNOSIS — E538 Deficiency of other specified B group vitamins: Secondary | ICD-10-CM | POA: Diagnosis not present

## 2023-11-07 LAB — VITAMIN B12: Vitamin B-12: 400 pg/mL (ref 211–911)

## 2023-11-08 ENCOUNTER — Ambulatory Visit (HOSPITAL_BASED_OUTPATIENT_CLINIC_OR_DEPARTMENT_OTHER): Payer: Medicare Other | Attending: Neurology | Admitting: Internal Medicine

## 2023-11-08 ENCOUNTER — Telehealth: Payer: Self-pay

## 2023-11-08 DIAGNOSIS — R413 Other amnesia: Secondary | ICD-10-CM | POA: Diagnosis not present

## 2023-11-08 DIAGNOSIS — G4731 Primary central sleep apnea: Secondary | ICD-10-CM

## 2023-11-08 DIAGNOSIS — R400 Somnolence: Secondary | ICD-10-CM | POA: Diagnosis not present

## 2023-11-08 DIAGNOSIS — G4733 Obstructive sleep apnea (adult) (pediatric): Secondary | ICD-10-CM | POA: Diagnosis not present

## 2023-11-08 DIAGNOSIS — R0683 Snoring: Secondary | ICD-10-CM | POA: Insufficient documentation

## 2023-11-08 NOTE — Telephone Encounter (Signed)
-----   Message from Theresa [name] sent at 11/04/2023 10:52 AM EDT ----- Can we let patient know that MRI brain showed no significant abnormalities? Thank you.

## 2023-11-08 NOTE — Telephone Encounter (Signed)
 Pt called an informed that MRI brain showed no significant abnormalities

## 2023-11-11 ENCOUNTER — Encounter (HOSPITAL_BASED_OUTPATIENT_CLINIC_OR_DEPARTMENT_OTHER): Payer: Medicare Other | Admitting: Internal Medicine

## 2023-11-12 ENCOUNTER — Other Ambulatory Visit: Payer: Self-pay | Admitting: Family Medicine

## 2023-11-13 DIAGNOSIS — R413 Other amnesia: Secondary | ICD-10-CM | POA: Diagnosis not present

## 2023-11-13 NOTE — Procedures (Signed)
 [facility] [address] [place], [place] [address] Tel: [phone]   Fax: [phone]  Home Sleep Test Interpretation  Patient Name: Theresa [name], Theresa [name] Date: 11/08/2023  Date of Birth: 1956-06-03 Study Type: HST  Age: 68 year MRN #: [id number]  Sex: Female Interpreting Physician: Jetty Duhamel [id number]  Height: 5\' 6"  Referring Physician: Patrcia Dolly, MD  Weight: 185.0 lbs Recording Tech: Elaina Pattee RPSGT RST  BMI: 30.1 Scoring Tech: Holly Neeriemer RPSGT RST   Indications for Polysomnography The patient is a 68 year-old Female who is 5\' 6"  and weighs 185.0 lbs. Her BMI equals 30.1.  A home sleep apnea test was performed to evaluate for -.  Medication  None reported   Polysomnogram Data A home sleep test recorded the standard physiologic parameters including EKG, nasal and oral airflow.  Respiratory parameters of chest and abdominal movements were recorded with Respiratory Inductance Plethysmography belts.  Oxygen saturation was recorded by pulse oximetry.   Study Architecture The total recording time of the polysomnogram was 436.0 minutes.  The total monitoring time was 437.0 minutes.  Time spent in Supine position was - minutes.   Respiratory Events The study revealed a presence of - obstructive, - central, and 1 mixed apneas resulting in an Apnea index of 0.1 events per hour.  There were 27 hypopneas (>=3% desaturation and/or arousal) resulting in an Apnea\Hypopnea Index (AHI >=3% desaturation and/or arousal) of 3.8 events per hour.  There were 7 hypopneas (>=4% desaturation) resulting in an Apnea\Hypopnea Index (AHI >=4% desaturation) of 1.1 events per hour.  There were - Respiratory Effort Related Arousals resulting in a RERA index of - events per hour. The Respiratory Disturbance Index is 3.8 events per hour.  The snore index was 47.2 events per hour.  Mean oxygen saturation was 93.0%.  The lowest oxygen saturation during  monitoring time was 84.0%.  Time spent <=88% oxygen saturation was 0.6 minutes (0.1%).  Cardiac Summary The average pulse rate was 77.2 bpm.  The minimum pulse rate was 55.0 bpm while the maximum pulse rate was 116.0 bpm.  Cardiac rhythm was normal.  Comment: Occasional apneas and hypopneas, within normal limits, AHI (4%) 1.1/hr. Snoring with oxygen desaturation to a nadir of 84% and mean 93%.  Diagnosis: Normal study  Recommendations: Manage for snoring and symptoms based on clinical judgment.   This study was personally reviewed and electronically signed by: Jetty Duhamel, MD Accredited Board Certified in Sleep Medicine Date/Time: 11/13/23  1:05   Study Overview  Recording Time: 652.4 min. Monitoring Time: 437.0 min.  Analysis Start:  07:56:44 PM Supine Time: - min.  Analysis Stop:  03:12:45 AM     Study Summary   Count Index Longest Event Duration  Apneas & Hypopneas: 28 3.8  Apneas: 16.0 sec.     Hypopneas: 31.3 sec.  RERAs: - - - sec.  Desaturations: 29 4.0 60.0 sec.  Snores: 344 47.2 70.7 sec.    Minimum Oxygen Saturation: 84.0%    Respiratory Summary   Total Duration Supine Non-Supine   Count Index Average Longest Count Index Count Index  Obstructive Apnea - - - - - - - -   Mixed Apnea 1 0.1 16.0 16.0 - - 1 0.1   Central Apnea - - - - - - - -   Total Apneas 1 0.1 16.0 16.0 - - 1 0.1            Hypopneas 3% 27 3.7 N.A. N.A. - -  27 3.7   Apneas & Hyp. 3% 28 3.8 N.A. N.A. - - 28 3.8            Hypopneas 4% 7 1.0 N.A. N.A. - - 7 1.0  Apneas & Hyp. 4% 8 1.1 N.A. N.A. - - 8 1.1             RERAs - - - - - - - -  RDI 29 4.0 N.A. N.A. - - 29 4.0   Oxygen Saturation Summary   Total Supine Non-Supine  Average SpO2 93.0% - 93.0%  Minimum SpO2 84.0% - 84.0%   Maximum SpO2 98.0% - 98.0%   Oxygen Saturation Distribution  Range (%) Time in range (min) Time in range (%)  90.0 - 100.0 413.9 95.4%  80.0 - 90.0 16.5 3.8%  70.0 - 80.0 - -  60.0 - 70.0 - -  50.0 -  60.0 - -  0.0 - 50.0 - -  Time Spent <=88% SpO2  Range (%) Time in range (min) Time in range (%)  0.0 - 88.0 0.6 0.1%  Cardiac Summary   Total Supine Non-Supine  Average Pulse Rate (BPM) 77.2 - 77.2  Minimum Pulse Rate (BPM) 55.0 - 55.0  Maximum Pulse Rate (BPM) 116.0 - 116.0                    Comments  -                         Clenton Esper Diplomate, Biomedical engineer of Sleep Medicine  ELECTRONICALLY SIGNED ON:  11/13/2023, 1:01 PM  [facility] PH: [phone]   FX: [phone] ACCREDITED BY THE AMERICAN ACADEMY OF SLEEP MEDICINE

## 2023-11-14 ENCOUNTER — Encounter: Payer: Self-pay | Admitting: Family Medicine

## 2023-11-14 ENCOUNTER — Telehealth: Payer: Self-pay

## 2023-11-14 DIAGNOSIS — I10 Essential (primary) hypertension: Secondary | ICD-10-CM

## 2023-11-14 MED ORDER — LOSARTAN POTASSIUM-HCTZ 50-12.5 MG PO TABS: ORAL_TABLET | ORAL | 3 refills | Status: DC

## 2023-11-14 NOTE — Telephone Encounter (Signed)
-----   Message from Van Clines sent at 11/14/2023 10:49 AM EDT ----- Regarding: sleep study results Pls let her know the sleep study was normal,no sleep apnea seen. Thanks ----- Message ----- From: Waymon Budge, MD Sent: 11/13/2023   1:07 PM EDT To: Van Clines, MD

## 2023-11-14 NOTE — Telephone Encounter (Signed)
 Pt called an informed that her sleep study was normal,no sleep apnea seen.

## 2023-11-21 ENCOUNTER — Ambulatory Visit: Admitting: Psychiatry

## 2023-11-21 DIAGNOSIS — F319 Bipolar disorder, unspecified: Secondary | ICD-10-CM | POA: Diagnosis not present
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
## 2023-11-21 NOTE — Progress Notes (Deleted)
   Established Patient Office Visit  Subjective   Patient ID: Theresa [name], female    DOB: [birth year]  Age: 68 y.o. MRN: [id number]  No chief complaint on file.   HPI  {History (Optional):23778}  ROS    Objective:     There were no vitals taken for this visit. {Vitals History (Optional):23777}  Physical Exam   No results found for any visits on 11/21/23.  {Labs (Optional):23779}  The 10-year ASCVD risk score (Arnett DK, et al., 2019) is: 11.3%    Assessment & Plan:   Problem List Items Addressed This Visit   None   No follow-ups on file.    Mathis Fare, LCSW        [facility] Counselor/Therapist Progress Note  Patient ID: Theresa [name], MRN: [id number],    Date: 11/21/2023  Time Spent: ***   Treatment Type: {CHL AMB THERAPY TYPES:[id number]}  Reported Symptoms: ***  Mental Status Exam:  Appearance:   {PSY:22683}     Behavior:  {PSY:21022743}  Motor:  {PSY:22302}  Speech/Language:   {PSY:22685}  Affect:  {PSY:22687}  Mood:  {PSY:31886}  Thought process:  {PSY:31888}  Thought content:    {PSY:732-538-5836}  Sensory/Perceptual disturbances:    {[phone]}  Orientation:  {PSY:30297}  Attention:  {PSY:22877}  Concentration:  {[phone]}  Memory:  {[phone]}  Fund of knowledge:   {[phone]}  Insight:    {[phone] 838-6099}  Judgment:   {PSY:([phone] 838-6099}  Impulse Control:  {PSY:([phone] 838-6099}   Risk Assessment: Danger to Self:  {PSY:22692} Self-injurious Behavior: {PSY:22692} Danger to Others: {PSY:22692} Duty to Warn:{PSY:311194} Physical Aggression / Violence:{PSY:21197} Access to Firearms a concern: {PSY:21197} Gang Involvement:{PSY:21197}  Subjective: ***   Interventions: {PSY:858-869-7012}  Diagnosis:No diagnosis found.  Plan: ***  Mathis Fare, LCSW

## 2023-11-21 NOTE — Progress Notes (Deleted)
      [facility] Counselor/Therapist Progress Note  Patient ID: Theresa [name], MRN: [id number],    Date: 11/21/2023  Time Spent: ***   Treatment Type: {CHL AMB THERAPY TYPES:[id number]}  Reported Symptoms: ***  Mental Status Exam:  Appearance:   {PSY:22683}     Behavior:  {PSY:21022743}  Motor:  {PSY:22302}  Speech/Language:   {PSY:22685}  Affect:  {PSY:22687}  Mood:  {PSY:31886}  Thought process:  {PSY:31888}  Thought content:    {PSY:516 752 8026}  Sensory/Perceptual disturbances:    {PSY:3078332203}  Orientation:  {PSY:30297}  Attention:  {PSY:22877}  Concentration:  {PSY:925-519-7321}  Memory:  {PSY:321-578-1200}  Fund of knowledge:   {PSY:925-519-7321}  Insight:    {PSY:925-519-7321}  Judgment:   {PSY:925-519-7321}  Impulse Control:  {PSY:925-519-7321}   Risk Assessment: Danger to Self:  {PSY:22692} Self-injurious Behavior: {PSY:22692} Danger to Others: {PSY:22692} Duty to Warn:{PSY:311194} Physical Aggression / Violence:{PSY:21197} Access to Firearms a concern: {PSY:21197} Gang Involvement:{PSY:21197}  Subjective: ***   Interventions: {PSY:725-268-5535}  Diagnosis:No diagnosis found.  Plan: ***  Mathis Fare, LCSW

## 2024-01-12 ENCOUNTER — Telehealth: Payer: Self-pay | Admitting: Psychiatry

## 2024-01-12 NOTE — Telephone Encounter (Signed)
 I'm sending this as a phone encounter instead of a message, in case the pt calls back, the front office staff will have visibility.  Pt Lvm @ 3:20p stating that she was seeing Theresa [name] but Theresa [name] told her she needed to see someone else for her particular issues of trauma.  Pt said she sees Theresa [name] at [facility] and Theresa [name] suggested she see Theresa [name] here.  Pt is asking if Theresa [name] will ask Theresa [name] will see her as a patient.  Let me know and I will call her back.

## 2024-01-26 ENCOUNTER — Encounter: Payer: Self-pay | Admitting: Family Medicine

## 2024-01-26 ENCOUNTER — Ambulatory Visit (HOSPITAL_BASED_OUTPATIENT_CLINIC_OR_DEPARTMENT_OTHER)
Admission: RE | Admit: 2024-01-26 | Discharge: 2024-01-26 | Disposition: A | Discharge status: Home / Self Care | Source: Ambulatory Visit | Attending: Family Medicine | Admitting: Family Medicine

## 2024-01-26 ENCOUNTER — Inpatient Hospital Stay (HOSPITAL_BASED_OUTPATIENT_CLINIC_OR_DEPARTMENT_OTHER): Admission: RE | Admit: 2024-01-26 | Payer: Medicare Other | Source: Ambulatory Visit

## 2024-01-26 DIAGNOSIS — E2839 Other primary ovarian failure: Secondary | ICD-10-CM | POA: Insufficient documentation

## 2024-01-26 DIAGNOSIS — Z780 Asymptomatic menopausal state: Secondary | ICD-10-CM | POA: Diagnosis not present

## 2024-02-09 ENCOUNTER — Other Ambulatory Visit: Payer: Self-pay | Admitting: Family Medicine

## 2024-02-09 DIAGNOSIS — K219 Gastro-esophageal reflux disease without esophagitis: Secondary | ICD-10-CM

## 2024-02-19 ENCOUNTER — Other Ambulatory Visit (INDEPENDENT_AMBULATORY_CARE_PROVIDER_SITE_OTHER): Payer: Self-pay | Admitting: Otolaryngology

## 2024-02-23 ENCOUNTER — Ambulatory Visit: Admitting: Psychiatry

## 2024-02-23 ENCOUNTER — Telehealth: Payer: Self-pay | Admitting: Psychiatry

## 2024-02-23 ENCOUNTER — Encounter: Payer: Self-pay | Admitting: Psychiatry

## 2024-02-23 DIAGNOSIS — F4310 Post-traumatic stress disorder, unspecified: Secondary | ICD-10-CM | POA: Diagnosis not present
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
## 2024-02-23 NOTE — Telephone Encounter (Signed)
 [name], Theresa [name] are scheduled for a virtual visit with your provider today.    Just as we do with appointments in the office, we must obtain your consent to participate.  Your consent will be active for this visit and any virtual visit you may have with one of our providers in the next 365 days.    If you have a MyChart account, I can also send a copy of this consent to you electronically.  All virtual visits are billed to your insurance company just like a traditional visit in the office.  As this is a virtual visit, video technology does not allow for your provider to perform a traditional examination.  This may limit your provider's ability to fully assess your condition.  If your provider identifies any concerns that need to be evaluated in person or the need to arrange testing such as labs, EKG, etc, we will make arrangements to do so.    Although advances in technology are sophisticated, we cannot ensure that it will always work on either your end or our end.  If the connection with a video visit is poor, we may have to switch to a telephone visit.  With either a video or telephone visit, we are not always able to ensure that we have a secure connection.   I need to obtain your verbal consent now.   Are you willing to proceed with your visit today?   Theresa [name] has provided verbal consent on 02/23/2024 for a virtual visit (video or telephone).   Silvano Pacini, [facility] 02/23/2024  2:59 PM

## 2024-03-21 ENCOUNTER — Encounter: Payer: Self-pay | Admitting: Family Medicine

## 2024-03-21 NOTE — Progress Notes (Signed)
 [facility] [address] [place], [place] [address] [phone]  Date:  03/26/2024   Name:  Theresa [name]   DOB:  1955/11/18   MRN:  [id number]  PCP:  Theresa Harlene BROCKS, MD    Chief Complaint: Medical Management of Chronic Issues   History of Present Illness:  Theresa [name] is a 68 y.o. very pleasant female patient who presents with the following:  Patient seen today for periodic follow-up.  Most recent visit with myself was in February of this year History of bipolar disorder and epilepsy, tardive dyskinesia, dyslipidemia, essential hypertension   At our visit in March she was having some difficulty with seizure breakthrough, Dr. 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     She did have colpo in 2019- discussed with pt, she notes her GYN is continuing to manage there pap and she is seeing her for regular follow-up Most recent lab work in [employer], vitamin D , B12, CBC, A1c  She is taking zyrtec, 2 nasal sprays all for chronic cough  We did a chest x-ray last year for this same cough and she also has an upper GI last year which showed some esophagitis  IMPRESSION: 1. Patulous GE junction with a tiny hiatal hernia. 2. Mild reflux esophagitis. 3. Trace left pleural effusion/pleural thickening.  Her cough may come and go all day but is worse when she is laying down in bed Increased dose of her PPI did help some  She did have a seizure and bit her tongue about 2 years ago- she notes her tongue seems to itch since then  Patient Active Problem List   Diagnosis Date Noted   B12 deficiency 09/27/2023   Breakthrough seizure (HCC) 06/15/2022   Hypokalemia 06/15/2022   Hyponatremia 06/15/2022   Elevated AST (SGOT) 06/15/2022   Tongue laceration  06/15/2022   Dyslipidemia 05/05/2020   Manic bipolar I disorder in partial remission (HCC) 07/25/2018   Posttraumatic stress disorder 07/25/2018   Generalized idiopathic epilepsy and epileptic syndromes, not intractable, without status epilepticus (HCC) 06/24/2017   Tardive dyskinesia 01/21/2017   Special screening for malignant neoplasms, colon 06/16/2012   History of colonic polyps 06/16/2012   Bipolar disorder (HCC) 04/02/2012    Past Medical History:  Diagnosis Date   Adenomatous colon polyp    Allergy    Anxiety    Bipolar 1 disorder (HCC)    Blood transfusion without reported diagnosis    Cancer (HCC) years ago   a skin basil cell bump   Depression    Elevated LFTs    GERD (gastroesophageal reflux disease)    Hepatic steatosis 05/07/2013   Hyperlipidemia    Hypertension    Seizures (HCC) 1977   last seizure 05/2022   Tardive dyskinesia     Past Surgical History:  Procedure Laterality Date   APPENDECTOMY     COLONOSCOPY  06/16/2012   Procedure: COLONOSCOPY;  Surgeon: Theresa CHRISTELLA Nida, MD;  Location: [facility];  Service: Endoscopy;  Laterality: N/A;   DILATION AND CURETTAGE OF UTERUS     EYE SURGERY     KNEE ARTHROSCOPY WITH MENISCAL REPAIR Left    NECK SURGERY     plate with screws to 3 cervical spines   SPINE SURGERY  Social History   Tobacco Use   Smoking status: Never   Smokeless tobacco: Never  Vaping Use   Vaping status: Never Used  Substance Use Topics   Alcohol use: Yes    Comment: Maybe several times per year   Drug use: Never    Family History  Problem Relation Age of Onset   Colon polyps Mother    Cancer Mother    Heart disease Mother    Alcoholism Mother    Hypertension Mother    Alcohol abuse Mother    Early death Mother    Colon polyps Father    Cancer Father    Alcoholism Father    Bipolar disorder Father    Alcohol abuse Father    Drug abuse Brother    Alcohol abuse Brother    Depression Brother    Bipolar disorder  Other    Alcohol abuse Sister    Drug abuse Sister    Colon cancer Neg Hx    Stomach cancer Neg Hx    Breast cancer Neg Hx    Esophageal cancer Neg Hx    Rectal cancer Neg Hx     Allergies  Allergen Reactions   Cyclobenzaprine  Other (See Comments)    Blisters in mouth   Lamictal [Lamotrigine]     Acute renal failure   Lybalvi  [Olanzapine -Samidorphan] Other (See Comments)    Nausea, dizziness   Ropinirole  Nausea And Vomiting   Tramadol Nausea And Vomiting    Reaction??   Acetaminophen  Itching, Swelling and Rash   Codeine Swelling, Rash and Other (See Comments)    Can take hydrocodone    Penicillins     Abdominal pain    Medication list has been reviewed and updated.  Current Outpatient Medications on File Prior to Visit  Medication Sig Dispense Refill   ALPRAZolam  (XANAX  XR) 2 MG 24 hr tablet TAKE 1 TABLET BY MOUTH IN THE  MORNING AND AT BEDTIME 180 tablet 1   ALPRAZolam  (XANAX ) 1 MG tablet TAKE 1 TABLET BY MOUTH DAILY AS  NEEDED FOR SEVERE ANXIETY 30 tablet 5   carbamazepine  (CARBATROL ) 300 MG 12 hr capsule Take 1 capsule (300 mg total) by mouth every morning AND 2 capsules (600 mg total) at bedtime. 270 capsule 1   cetirizine (ZYRTEC) 10 MG tablet Take 10 mg by mouth daily as needed for allergies or rhinitis.     Cholecalciferol (VITAMIN D3) 1.25 MG (50000 UT) CAPS Take 1 weekly for 12 weeks 12 capsule 0   clonazePAM  (KLONOPIN ) 1 MG tablet Take 1 tablet (1 mg total) by mouth at bedtime. 90 tablet 1   Deutetrabenazine  ER (AUSTEDO  XR) 48 MG TB24 Take 48 mg by mouth daily. 90 tablet 3   docusate sodium (COLACE) 50 MG capsule Take 50 mg by mouth daily as needed for mild constipation.     famotidine  (PEPCID ) 40 MG tablet Take 1 tablet (40 mg total) by mouth at bedtime. 30 tablet 5   fluticasone  (FLONASE ) 50 MCG/ACT nasal spray PLACE 1 SPRAY INTO BOTH NOSTRILS DAILY AS NEEDED FOR ALLERGIES OR RHINITIS. 16 mL 5   ipratropium (ATROVENT ) 0.06 % nasal spray Place 2 sprays into both  nostrils 3 (three) times daily. Use as needed for PND 15 mL 12   levETIRAcetam  (KEPPRA ) 500 MG tablet Take 1 tablet in AM, 1 and 1/2 tablets in PM 250 tablet 3   lovastatin  (MEVACOR ) 20 MG tablet TAKE 1 TABLET BY MOUTH EVERY DAY 90 tablet 3   magic mouthwash w/lidocaine   SOLN Diphenhydramine  12.5 mg/5 mL 1 part Viscous lidocaine  2% 1 part Maalox 1 part  Swish, hold, and spit or swallow 30 mL TID 900 mL 1   Melatonin 10 MG TABS Take 20 mg by mouth at bedtime.     metFORMIN  (GLUCOPHAGE ) 500 MG tablet TAKE 1 TABLET BY MOUTH TWICE A DAY WITH MEALS 180 tablet 0   Multiple Vitamins-Minerals (CENTRUM SILVER  PO) Take 1 tablet by mouth 2 (two) times a week.     OLANZapine  (ZYPREXA ) 10 MG tablet Take 1 tablet (10 mg total) by mouth at bedtime. 90 tablet 1   pantoprazole  (PROTONIX ) 40 MG tablet Take 1 tablet (40 mg total) by mouth 2 (two) times daily. 180 tablet 1   polyethylene glycol (MIRALAX  / GLYCOLAX ) 17 g packet Take 17 g by mouth daily.     traZODone  (DESYREL ) 100 MG tablet TAKE 1-2 TABLETS (100-200 MG TOTAL) BY MOUTH AT BEDTIME AS NEEDED. FOR SLEEP 180 tablet 3   zolpidem  (AMBIEN ) 10 MG tablet Take 1 tablet (10 mg total) by mouth at bedtime as needed. 90 tablet 1   No current facility-administered medications on file prior to visit.    Review of Systems:  As per HPI- otherwise negative.  BP Readings from Last 3 Encounters:  03/26/24 (!) 140/100  10/14/23 (!) 150/95  09/26/23 128/72      Physical Examination: Vitals:   03/26/24 1258 03/26/24 1322  BP: (!) 148/90 (!) 140/100  Pulse: 85   SpO2: 96%    Vitals:   03/26/24 1258  Weight: 185 lb (83.9 kg)  Height: 5' 6 (1.676 m)   Body mass index is 29.86 kg/m. Ideal Body Weight: Weight in (lb) to have BMI = 25: 154.6  GEN: no acute distress. Overweight, looks well  HEENT: Atraumatic, Normocephalic.  Bilateral TM wnl, oropharynx normal.  PEERL,EOMI.  Tongue is normal in appearance  Ears and Nose: No external deformity. CV:  RRR, No M/G/R. No JVD. No thrill. No extra heart sounds. PULM: CTA B, no wheezes, crackles, rhonchi. No retractions. No resp. distress. No accessory muscle use. ABD: S, NT, ND, +BS. No rebound. No HSM. EXTR: No c/c/e PSYCH: Normally interactive. Conversant.    Assessment and Plan: Essential hypertension - Plan: losartan -hydrochlorothiazide (HYZAAR) 100-25 MG tablet, CBC, Comprehensive metabolic panel with GFR  Chronic cough - Plan: sucralfate  (CARAFATE ) 1 g tablet  Dyslipidemia - Plan: Lipid panel  B12 deficiency - Plan: Vitamin B12  Screening for diabetes mellitus - Plan: Hemoglobin A1c  Thyroid  disorder screening - Plan: TSH  Send labs to Harlene Pepper with psych per pt reuqest  BP is elevated- pt notes mild elevation at home too Will increase her losartan /hydrochlorothiazide dosage to 100/25 Suspect her cough is due to reflux- will have her try adding carafate  as needed and see if it helps Follow-up on labs as above today Will plan further follow- up pending labs. Suspect tongue itching due to nerve damage from biting in the past  Signed Harlene Schroeder, MD  Received her labs 8/5- significant hyponatremia Called but did not reach, [facility] Message to pt via mychart as well Results for orders placed or performed in visit on 03/26/24  CBC   Collection Time: 03/26/24  1:33 PM  Result Value Ref Range   WBC 5.7 4.0 - 10.5 K/uL   RBC 4.69 3.87 - 5.11 Mil/uL   Platelets 221.0 150.0 - 400.0 K/uL   Hemoglobin 13.2 12.0 - 15.0 g/dL   HCT 60.3 63.9 - 53.9 %  MCV 84.5 78.0 - 100.0 fl   MCHC 33.4 30.0 - 36.0 g/dL   RDW 86.2 88.4 - 84.4 %  Comprehensive metabolic panel with GFR   Collection Time: 03/26/24  1:33 PM  Result Value Ref Range   Sodium 126 (L) 135 - 145 mEq/L   Potassium 4.3 3.5 - 5.1 mEq/L   Chloride 86 (L) 96 - 112 mEq/L   CO2 31 19 - 32 mEq/L   Glucose, Bld 101 (H) 70 - 99 mg/dL   BUN 7 6 - 23 mg/dL   Creatinine, Ser 9.28 0.40 - 1.20 mg/dL   Total Bilirubin  0.3 0.2 - 1.2 mg/dL   Alkaline Phosphatase 92 39 - 117 U/L   AST 22 0 - 37 U/L   ALT 31 0 - 35 U/L   Total Protein 7.0 6.0 - 8.3 g/dL   Albumin 4.7 3.5 - 5.2 g/dL   GFR 12.36 >39.99 mL/min   Calcium 9.5 8.4 - 10.5 mg/dL  Hemoglobin J8r   Collection Time: 03/26/24  1:33 PM  Result Value Ref Range   Hgb A1c MFr Bld 6.1 4.6 - 6.5 %  TSH   Collection Time: 03/26/24  1:33 PM  Result Value Ref Range   TSH 1.36 0.35 - 5.50 uIU/mL  Vitamin B12   Collection Time: 03/26/24  1:33 PM  Result Value Ref Range   Vitamin B-12 477 211 - 911 pg/mL  Lipid panel   Collection Time: 03/26/24  1:33 PM  Result Value Ref Range   Cholesterol 196 0 - 200 mg/dL   Triglycerides 00.9 0.0 - 149.0 mg/dL   HDL 13.79 >60.99 mg/dL   VLDL 80.1 0.0 - 59.9 mg/dL   LDL Cholesterol 90 0 - 99 mg/dL   Total CHOL/HDL Ratio 2    NonHDL 109.88    Connected via mychart  She has some plain losartan  100 mg, will DC the hydrochlorothiazide combo pill Add amlodipine  5 for BP control Repeat BMP this week

## 2024-03-22 ENCOUNTER — Ambulatory Visit: Admitting: Psychiatry

## 2024-03-22 ENCOUNTER — Encounter: Payer: Self-pay | Admitting: Psychiatry

## 2024-03-22 DIAGNOSIS — F4310 Post-traumatic stress disorder, unspecified: Secondary | ICD-10-CM | POA: Diagnosis not present

## 2024-03-22 NOTE — Progress Notes (Signed)
      [facility] Counselor/Therapist Progress Note  Patient ID: Theresa [name], MRN: [id number],    Date: 03/22/2024  Time Spent: 54 minutes start time 1:07 PM end time 2:01 PM  Treatment Type: Individual Therapy  Reported Symptoms: memory issues, focusing issues, anxiety, triggered responses, sleep issues, disassociation, impulsivity    Mental Status Exam:  Appearance:   Casual     Behavior:  Appropriate  Motor:  Restlestness  Speech/Language:   Normal Rate  Affect:  Appropriate  Mood:  anxious  Thought process:  normal  Thought content:    WNL  Sensory/Perceptual disturbances:    WNL  Orientation:  oriented to person, place, time/date, and situation  Attention:  Good  Concentration:  Good  Memory:  Immediate;   Fair  [employer] of knowledge:   Fair  Insight:    Good  Judgment:   Good  Impulse Control:  Good   Risk Assessment: Danger to Self:  No Self-injurious Behavior: No Danger to Others: No Duty to Warn:no Physical Aggression / Violence:No  Access to Firearms a concern: No  Gang Involvement:No   Subjective: Patient was present for session. She shared she had worked on her breathing exercises. She shared that when she was meeting with Webster Gu she would say she was green yellow orange or red on her level of desire to cut. She shared that her daughter has colon cancer and is currently under going chemotherapy.  Discussed treatment plan and set goals with patient.  Through developing that the importance of journaling how she feels symptoms and coping skills was discussed with patient and she was given a journal with the things to journal written out on the first page.  Also reviewed DBT skill T IPP, practice different components of it and wrote it out in the journal given to her.  Discussed importance of recognizing when her body is starting to have anxiety and doing some coping skills at that time.  Taught patient brain spotting relaxation exercise vergence in session.   Patient was encouraged to work on all the things written out for her in session and recognize that processing can be started at next session.  Interventions: Dialectical Behavioral Therapy and Solution-Oriented/Positive Psychology  Diagnosis:   ICD-10-CM   1. Posttraumatic stress disorder  F43.10       Plan: Patient is to continue going for walks to release negative emotions appropriately. She is to journal, practice coping skills and DBT skills to work on managing/decreasing triggered responses. She is to take medication as directed and continue meeting with her providers. Patient will set develop goals and treatment plan at next session.   Silvano Pacini, [facility]

## 2024-03-26 ENCOUNTER — Ambulatory Visit (INDEPENDENT_AMBULATORY_CARE_PROVIDER_SITE_OTHER): Payer: Medicare Other | Admitting: Family Medicine

## 2024-03-26 VITALS — BP 140/100 | HR 85 | Ht 66.0 in | Wt 185.0 lb

## 2024-03-26 DIAGNOSIS — I10 Essential (primary) hypertension: Secondary | ICD-10-CM | POA: Diagnosis not present

## 2024-03-26 DIAGNOSIS — E785 Hyperlipidemia, unspecified: Secondary | ICD-10-CM | POA: Diagnosis not present

## 2024-03-26 DIAGNOSIS — R053 Chronic cough: Secondary | ICD-10-CM | POA: Diagnosis not present

## 2024-03-26 DIAGNOSIS — Z131 Encounter for screening for diabetes mellitus: Secondary | ICD-10-CM | POA: Diagnosis not present

## 2024-03-26 DIAGNOSIS — Z1329 Encounter for screening for other suspected endocrine disorder: Secondary | ICD-10-CM

## 2024-03-26 DIAGNOSIS — E538 Deficiency of other specified B group vitamins: Secondary | ICD-10-CM | POA: Diagnosis not present

## 2024-03-26 DIAGNOSIS — E871 Hypo-osmolality and hyponatremia: Secondary | ICD-10-CM

## 2024-03-26 MED ORDER — LOSARTAN POTASSIUM-HCTZ 100-25 MG PO TABS: 1.0000 | ORAL_TABLET | Freq: Every day | ORAL | 3 refills | Status: DC

## 2024-03-26 MED ORDER — SUCRALFATE 1 G PO TABS
1.0000 g | ORAL_TABLET | Freq: Three times a day (TID) | ORAL | 1 refills | Status: DC
Start: 2024-03-26 — End: 2024-07-05

## 2024-03-26 NOTE — Patient Instructions (Signed)
 It was good to see you today Let's increase your losartan /hydrochlorothiazide to 100/25 total daily Try the carafate  as needed up to 4x daily (before meals and before bed) to hopefully improve your cough

## 2024-03-27 ENCOUNTER — Encounter: Payer: Self-pay | Admitting: Family Medicine

## 2024-03-27 LAB — LIPID PANEL
Cholesterol: 196 mg/dL (ref 0–200)
HDL: 86.2 mg/dL (ref >39.00)
LDL Cholesterol: 90 mg/dL (ref 0–99)
NonHDL: 109.88
Total CHOL/HDL Ratio: 2
Triglycerides: 99 mg/dL (ref 0.0–149.0)
VLDL: 19.8 mg/dL (ref 0.0–40.0)

## 2024-03-27 LAB — COMPREHENSIVE METABOLIC PANEL WITH GFR
ALT: 31 U/L (ref 0–35)
AST: 22 U/L (ref 0–37)
Albumin: 4.7 g/dL (ref 3.5–5.2)
Alkaline Phosphatase: 92 U/L (ref 39–117)
BUN: 7 mg/dL (ref 6–23)
CO2: 31 meq/L (ref 19–32)
Calcium: 9.5 mg/dL (ref 8.4–10.5)
Chloride: 86 meq/L — ABNORMAL LOW (ref 96–112)
Creatinine, Ser: 0.71 mg/dL (ref 0.40–1.20)
GFR: 87.63 mL/min (ref >60.00)
Glucose, Bld: 101 mg/dL — ABNORMAL HIGH (ref 70–99)
Potassium: 4.3 meq/L (ref 3.5–5.1)
Sodium: 126 meq/L — ABNORMAL LOW (ref 135–145)
Total Bilirubin: 0.3 mg/dL (ref 0.2–1.2)
Total Protein: 7 g/dL (ref 6.0–8.3)

## 2024-03-27 LAB — CBC
HCT: 39.6 % (ref 36.0–46.0)
Hemoglobin: 13.2 g/dL (ref 12.0–15.0)
MCHC: 33.4 g/dL (ref 30.0–36.0)
MCV: 84.5 fl (ref 78.0–100.0)
Platelets: 221 K/uL (ref 150.0–400.0)
RBC: 4.69 Mil/uL (ref 3.87–5.11)
RDW: 13.7 % (ref 11.5–15.5)
WBC: 5.7 K/uL (ref 4.0–10.5)

## 2024-03-27 LAB — TSH: TSH: 1.36 u[IU]/mL (ref 0.35–5.50)

## 2024-03-27 LAB — VITAMIN B12: Vitamin B-12: 477 pg/mL (ref 211–911)

## 2024-03-27 LAB — HEMOGLOBIN A1C: Hgb A1c MFr Bld: 6.1 % (ref 4.6–6.5)

## 2024-03-27 MED ORDER — AMLODIPINE BESYLATE 5 MG PO TABS: 5.0000 mg | ORAL_TABLET | Freq: Every day | ORAL | 0 refills | Status: DC

## 2024-03-27 NOTE — Addendum Note (Signed)
 Addended by: WATT RAISIN C on: 03/27/2024 09:09 PM   Modules accepted: Orders

## 2024-03-29 ENCOUNTER — Other Ambulatory Visit (INDEPENDENT_AMBULATORY_CARE_PROVIDER_SITE_OTHER)

## 2024-03-29 DIAGNOSIS — E871 Hypo-osmolality and hyponatremia: Secondary | ICD-10-CM | POA: Diagnosis not present

## 2024-03-30 ENCOUNTER — Encounter: Payer: Self-pay | Admitting: Family Medicine

## 2024-03-30 LAB — BASIC METABOLIC PANEL WITH GFR
BUN: 10 mg/dL (ref 6–23)
CO2: 29 meq/L (ref 19–32)
Calcium: 9.4 mg/dL (ref 8.4–10.5)
Chloride: 94 meq/L — ABNORMAL LOW (ref 96–112)
Creatinine, Ser: 0.68 mg/dL (ref 0.40–1.20)
GFR: 89.76 mL/min (ref >60.00)
Glucose, Bld: 105 mg/dL — ABNORMAL HIGH (ref 70–99)
Potassium: 5 meq/L (ref 3.5–5.1)
Sodium: 136 meq/L (ref 135–145)

## 2024-04-06 ENCOUNTER — Encounter: Payer: Self-pay | Admitting: Family Medicine

## 2024-04-12 ENCOUNTER — Encounter: Payer: Self-pay | Admitting: Psychiatry

## 2024-04-12 ENCOUNTER — Ambulatory Visit: Admitting: Psychiatry

## 2024-04-12 DIAGNOSIS — F4310 Post-traumatic stress disorder, unspecified: Secondary | ICD-10-CM | POA: Diagnosis not present

## 2024-04-12 NOTE — Progress Notes (Signed)
      [facility] Counselor/Therapist Progress Note  Patient ID: Theresa [name], MRN: [id number],    Date: 04/12/2024  Time Spent: 51 minutes start time 9:09 AM end time 10 AM  Treatment Type: Individual Therapy  Reported Symptoms: sadness, anxiety, memory issues, disassociation, focusing issues, health issues  Mental Status Exam:  Appearance:   Casual     Behavior:  Appropriate  Motor:  Normal  Speech/Language:   Normal Rate  Affect:  Appropriate  Mood:  anxious  Thought process:  normal  Thought content:    WNL  Sensory/Perceptual disturbances:    WNL  Orientation:  oriented to person, place, time/date, and situation  Attention:  Good  Concentration:  Good  Memory:  WNL  Fund of knowledge:   Good  Insight:    Good  Judgment:   Good  Impulse Control:  Good   Risk Assessment: Danger to Self:  No Self-injurious Behavior: No Danger to Others: No Duty to Warn:no Physical Aggression / Violence:No  Access to Firearms a concern: No  Gang Involvement:No   Subjective: Patient was present for session. She shared pictures that had painted in session. She went on to share that her daughter is going through chemo for her colon cancer and it is very hard for her. Patient explained typically she does not cry but she is crying when her daughter tells her what is going on with her.  Encouraged patient to feel comfortable with releasing the negative emotions or tears if that is what she feels she needs to do.  Patient had brought journal to session and wanted to make sure she was doing what clinician needed her to do it encouraged her to write down symptoms, tools that are helpful, feelings, and triggered memories.  Taught patient different grounding exercises that she also wrote out in her journal.  Also taught patient DBT skill T IPP and ST OP in session.  Encouraged patient to recognize that she can have multiple tools so that when things are triggering for her she can utilize her tools.   Patient was encouraged to try and learn to listen to her body discussed how her body will respond to her anxiety before her brain kicks on.  Patient was able to realize that she does seem to carry most of her anxiety in her stomach and so she can start paying attention to it and utilize skills as soon as it starts acting strange.  Patient shared she is trying to do some paced breathing every hour like suggested but is struggling but will continue working on that goal as well.  Interventions: Dialectical Behavioral Therapy and Solution-Oriented/Positive Psychology  Diagnosis:   ICD-10-CM   1. Posttraumatic stress disorder  F43.10       Plan:  Patient is to continue going for walks to release negative emotions appropriately. She is to journal, practice coping skills and DBT skills to work on managing/decreasing triggered responses. She is to take medication as directed and continue meeting with her providers. Patient will set develop goals and treatment plan at next session.   Silvano Pacini, [facility]

## 2024-04-20 ENCOUNTER — Encounter: Payer: Self-pay | Admitting: Neurology

## 2024-04-20 ENCOUNTER — Ambulatory Visit: Payer: Medicare Other | Admitting: Neurology

## 2024-04-20 ENCOUNTER — Encounter (INDEPENDENT_AMBULATORY_CARE_PROVIDER_SITE_OTHER): Payer: Self-pay

## 2024-04-20 VITALS — BP 140/97 | HR 98 | Resp 20 | Ht 66.0 in | Wt 185.0 lb

## 2024-04-20 DIAGNOSIS — G40309 Generalized idiopathic epilepsy and epileptic syndromes, not intractable, without status epilepticus: Secondary | ICD-10-CM | POA: Diagnosis not present

## 2024-04-20 DIAGNOSIS — K146 Glossodynia: Secondary | ICD-10-CM | POA: Diagnosis not present

## 2024-04-20 MED ORDER — MAGIC MOUTHWASH W/LIDOCAINE: ORAL | 3 refills | Status: DC

## 2024-04-20 MED ORDER — LEVETIRACETAM 500 MG PO TABS: ORAL_TABLET | ORAL | 3 refills | Status: DC

## 2024-04-20 NOTE — Patient Instructions (Signed)
 Good to see you doing better!  Continue all your medications  2. Referral will be sent to ENT for tongue pain  3. Follow-up in 6 months, call for any changes   Seizure Precautions: 1. If medication has been prescribed for you to prevent seizures, take it exactly as directed.  Do not stop taking the medicine without talking to your doctor first, even if you have not had a seizure in a long time.   2. Avoid activities in which a seizure would cause danger to yourself or to others.  Don't operate dangerous machinery, swim alone, or climb in high or dangerous places, such as on ladders, roofs, or girders.  Do not drive unless your doctor says you may.  3. If you have any warning that you may have a seizure, lay down in a safe place where you can't hurt yourself.    4.  No driving for 6 months from last seizure, as per [place]  state law.   Please refer to the following link on the [employer]ile[employer] of America's website for more information: http://www.epilepsyfoundation.org/answerplace/Social/driving/drivingu.cfm   5.  Maintain good sleep hygiene. Avoid alcohol.  6.  Contact your doctor if you have any problems that may be related to the medicine you are taking.  7.  Call 911 and bring the patient back to the ED if:        A.  The seizure lasts longer than 5 minutes.       B.  The patient doesn't awaken shortly after the seizure  C.  The patient has new problems such as difficulty seeing, speaking or moving  D.  The patient was injured during the seizure  E.  The patient has a temperature over 102 F (39C)  F.  The patient vomited and now is having trouble breathing
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
## 2024-04-21 ENCOUNTER — Encounter: Payer: Self-pay | Admitting: Family Medicine

## 2024-04-23 ENCOUNTER — Encounter: Payer: Self-pay | Admitting: Neurology

## 2024-04-24 ENCOUNTER — Other Ambulatory Visit: Payer: Self-pay | Admitting: Family Medicine

## 2024-04-24 DIAGNOSIS — I10 Essential (primary) hypertension: Secondary | ICD-10-CM

## 2024-04-26 ENCOUNTER — Ambulatory Visit: Admitting: Psychiatry

## 2024-04-26 DIAGNOSIS — F4310 Post-traumatic stress disorder, unspecified: Secondary | ICD-10-CM

## 2024-04-26 NOTE — Progress Notes (Signed)
      [facility] Counselor/Therapist Progress Note  Patient ID: Theresa [name], MRN: [id number],    Date: 04/26/2024  Time Spent: 50 minutes start time 10:09 AM end time 10:59 AM  Treatment Type: Individual Therapy  Reported Symptoms: anxiety, sleep issues, disassociate episodes, memory issues, triggered responses, flashbacks  Mental Status Exam:  Appearance:   Casual     Behavior:  Appropriate  Motor:  Restlestness  Speech/Language:   Normal Rate  Affect:  Appropriate  Mood:  anxious  Thought process:  normal  Thought content:    WNL  Sensory/Perceptual disturbances:    WNL  Orientation:  oriented to person, place, time/date, and situation  Attention:  Good  Concentration:  Good  Memory:  Immediate;   Fair  Progress Energy of knowledge:   Fair  Insight:    Good  Judgment:   Good  Impulse Control:  Good   Risk Assessment: Danger to Self:  No Self-injurious Behavior: No Danger to Others: No Duty to Warn:no Physical Aggression / Violence:No  Access to Firearms a concern: No  Gang Involvement:No   Subjective: Patient was present for session. She shared she is still working on trying her DBT skills. She has practiced her breathing exercises.  She shared she is not sure when she is having a episode. Discussed trying to use her skills as soon as she feels anxious and to start paying more attention to her body for cues. She shared she feels anxious when her husband is frustrated with her. Patient stated when he is angry / yelling with her about her memory it is triggering for her. SUDS level 10, Negative cognition I'm a lot of trouble, felt anxiety in her stomach and shoulder.  Patient struggled with processing but was finally able to reduce suds level to 6.  Patient explained that it was difficult for her due to getting distracted by the tapers.  Discussed importance of just allowing processing and to continue using her coping skills as things surface.  Reviewed DBT skill TIP P again with  patient and had her do a progressive muscle relaxation exercise at the end of session.  Interventions: Dialectical Behavioral Therapy, Insight-Oriented, and BSP  Diagnosis:   ICD-10-CM   1. Posttraumatic stress disorder  F43.10       Plan: Patient is to continue going for walks to release negative emotions appropriately. She is to journal, practice coping skills and DBT skills to work on managing/decreasing triggered responses. She is to take medication as directed and continue meeting with her providers. Patient will set develop goals and treatment plan at next session.   Silvano Pacini, [facility]

## 2024-05-01 ENCOUNTER — Other Ambulatory Visit: Payer: Self-pay | Admitting: Physician Assistant

## 2024-05-04 ENCOUNTER — Other Ambulatory Visit: Payer: Self-pay | Admitting: Family Medicine

## 2024-05-04 DIAGNOSIS — Z1231 Encounter for screening mammogram for malignant neoplasm of breast: Secondary | ICD-10-CM

## 2024-05-09 ENCOUNTER — Ambulatory Visit: Admitting: Psychiatry

## 2024-05-09 DIAGNOSIS — F4310 Post-traumatic stress disorder, unspecified: Secondary | ICD-10-CM | POA: Diagnosis not present
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
## 2024-05-20 ENCOUNTER — Other Ambulatory Visit: Payer: Self-pay | Admitting: Family Medicine

## 2024-05-20 DIAGNOSIS — I10 Essential (primary) hypertension: Secondary | ICD-10-CM

## 2024-05-21 ENCOUNTER — Other Ambulatory Visit: Payer: Self-pay | Admitting: Family Medicine

## 2024-05-22 ENCOUNTER — Encounter: Payer: Self-pay | Admitting: Family Medicine

## 2024-05-23 MED ORDER — AMLODIPINE BESYLATE 2.5 MG PO TABS: 2.5000 mg | ORAL_TABLET | Freq: Every day | ORAL | 1 refills | Status: DC

## 2024-05-23 NOTE — Addendum Note (Signed)
 Addended by: WATT RAISIN C on: 05/23/2024 07:12 PM   Modules accepted: Orders

## 2024-05-24 ENCOUNTER — Ambulatory Visit (INDEPENDENT_AMBULATORY_CARE_PROVIDER_SITE_OTHER): Admitting: Psychiatry

## 2024-05-24 ENCOUNTER — Encounter: Payer: Self-pay | Admitting: Psychiatry

## 2024-05-24 DIAGNOSIS — F4310 Post-traumatic stress disorder, unspecified: Secondary | ICD-10-CM

## 2024-05-24 NOTE — Progress Notes (Signed)
 [facility] Counselor/Therapist Progress Note  Patient ID: IZZABELLE BOULEY, MRN: [id number],    Date: 05/24/2024  Time Spent: 50 minutes start time 10:05 AM end time 10:55 AM  Treatment Type: Individual Therapy  Reported Symptoms: memory issues, anxiety, triggered responses, health issues, flashbacks  Mental Status Exam:  Appearance:   Casual     Behavior:  Appropriate  Motor:  Restlestness  Speech/Language:   Normal Rate  Affect:  Appropriate  Mood:  anxious  Thought process:  normal  Thought content:    WNL  Sensory/Perceptual disturbances:    WNL  Orientation:  oriented to person, place, time/date, and situation  Attention:  Good  Concentration:  Good  Memory:  WNL  Fund of knowledge:   Good  Insight:    Good  Judgment:   Good  Impulse Control:  Good   Risk Assessment: Danger to Self:  No Self-injurious Behavior: No Danger to Others: No Duty to Warn:no Physical Aggression / Violence:No  Access to Firearms a concern: No  Gang Involvement:No   Subjective: Patient was present for session. She shared she has been struggling due  to having an increase in BP and her doctor had to increase her medication. She shared that she was not as anxious about today's session.  She went on to share her memory has been worse which is concerning for her discussed talking to her provider about that issue. She shared her daughter is still going through her cancer treatments but she is hopeful she is healing.  Patient stated she is sometimes struggling with completing all of her different coping skills.  Encouraged her to recognize that her skills are there to be helpful and to practice but she does not need to feel the pressure of doing them all the time.  Discussed ways that she can work them into her day so that things feel more natural or when she needs to start applying the skills in different situations.  Patient discussed the fact that her grandchildren are very special to her and  that she finds them to be a gift.  Challenged patient to recognize that she is well is a gift and she needs to work on her affirming herself but that truth.  Patient was educated on development and as to why she felt like send many of the bad things that happened at a younger age were her fault even though rationally as an adult she knows does not true.  She was encouraged to take time to let the younger parts of her know the truth even though it may be difficult for her to hear or understand.  Patient stated that memories from her stepfather surfaced when she was going through his things after he passed.  Patient went on to share that there is still lots of gaps and things that she does not remember.  Encouraged her not to try and push anything because her brain was doing what it felt it needed to do to keep her safe at this point.  When it is time for her to have the blanks in her memory filled in it will occur.  Patient was encouraged to work on doing some journaling through art and to make it very primitive possibly finger paining or something very tactile that can release emotions in a healthy way.  Interventions: Solution-Oriented/Positive Psychology and Insight-Oriented  Diagnosis:   ICD-10-CM   1. Posttraumatic stress disorder  F43.10       Plan:  Patient is to continue going for walks to release negative emotions appropriately.  Patient is to practice safe place exercise from session to help decrease negative emotions.  She is to journal through writing and art, practice coping skills and DBT skills to work on managing/decreasing triggered responses. She is to take medication as directed and continue meeting with her providers.   Silvano Pacini, [facility]

## 2024-05-28 ENCOUNTER — Encounter (INDEPENDENT_AMBULATORY_CARE_PROVIDER_SITE_OTHER): Payer: Self-pay

## 2024-05-28 ENCOUNTER — Ambulatory Visit (INDEPENDENT_AMBULATORY_CARE_PROVIDER_SITE_OTHER)

## 2024-05-28 VITALS — BP 151/102 | HR 82 | Temp 98.1°F | Ht 66.0 in | Wt 183.0 lb

## 2024-05-28 DIAGNOSIS — K146 Glossodynia: Secondary | ICD-10-CM

## 2024-05-28 NOTE — Progress Notes (Signed)
 Dear Dr. Georjean, Here is my assessment for our mutual patient, Theresa [name]. Thank you for allowing me the opportunity to care for your patient. Please do not hesitate to contact me should you have any other questions. Sincerely, Dr. Penne Croak  Otolaryngology Clinic Note Referring provider: Dr. Georjean HPI:  Theresa [name] is a 68 y.o. female kindly referred by Dr. Georjean for evaluation of right tongue pain History of Present Illness Theresa [name] is a 68 year old female who presents with persistent tongue pain following a seizure-related injury. She was referred by her neurologist for further evaluation of her tongue pain.  Tongue pain and paresthesia - Persistent pain and itching localized to the right side of the tongue following a seizure-related injury approximately two years ago during a colonoscopy - Initial injury was approximately half an inch deep - Current symptoms include intermittent itching and pain, described as 'itching, hurting' - Sensation was previously continuous but is now less frequent and does not occur during sleep - Last episode occurred the day before the visit; symptoms typically resolve when she bites the area - Pain and itching interfere with eating at times - No current burning tongue sensation  Symptom modifiers and self-management - Intermittent use of 'magic mouthwash' for symptomatic relief, especially when pain interferes with eating - Consumption of jalapeno peppers, which may provide relief similar to capsaicin rinse - Biting the affected area relieves the itching sensation  History of oral sensory disturbances - Prior episodes of burning tongue sensation associated with medication changes and menopause   Independent Review of Additional Tests or Records:  Reviewed external note from referring PCP, Aquino,describing onset of pain 2 years ago and another injury 08/2023. Relevant history incorporated into today's evaluation.    PMH/Meds/All/SocHx/FamHx/ROS:   Past Medical History:  Diagnosis Date   Adenomatous colon polyp    Allergy    Anxiety    Bipolar 1 disorder (HCC)    Blood transfusion without reported diagnosis    Cancer (HCC) years ago   a skin basil cell bump   Depression    Elevated LFTs    GERD (gastroesophageal reflux disease)    Hepatic steatosis 05/07/2013   Hyperlipidemia    Hypertension    Seizures (HCC) 1977   last seizure 05/2022   Tardive dyskinesia      Past Surgical History:  Procedure Laterality Date   APPENDECTOMY     COLONOSCOPY  06/16/2012   Procedure: COLONOSCOPY;  Surgeon: Princella CHRISTELLA Nida, MD;  Location: [facility];  Service: Endoscopy;  Laterality: N/A;   DILATION AND CURETTAGE OF UTERUS     EYE SURGERY     KNEE ARTHROSCOPY WITH MENISCAL REPAIR Left    NECK SURGERY     plate with screws to 3 cervical spines   SPINE SURGERY      Family History  Problem Relation Age of Onset   Colon polyps Mother    Cancer Mother    Heart disease Mother    Alcoholism Mother    Hypertension Mother    Alcohol abuse Mother    Early death Mother    Colon polyps Father    Cancer Father    Alcoholism Father    Bipolar disorder Father    Alcohol abuse Father    Drug abuse Brother    Alcohol abuse Brother    Depression Brother    Bipolar disorder Other    Alcohol abuse Sister    Drug abuse Sister    Colon cancer Neg  Hx    Stomach cancer Neg Hx    Breast cancer Neg Hx    Esophageal cancer Neg Hx    Rectal cancer Neg Hx      Social Connections: Socially Isolated (10/10/2023)   Social Connection and Isolation Panel    Frequency of Communication with Friends and Family: Never    Frequency of Social Gatherings with Friends and Family: Once a week    Attends Religious Services: Never    Database administrator or Organizations: No    Attends Engineer, structural: Not on file    Marital Status: Married      Current Outpatient Medications:    ALPRAZolam  (XANAX   XR) 2 MG 24 hr tablet, TAKE 1 TABLET BY MOUTH IN THE  MORNING AND AT BEDTIME, Disp: 180 tablet, Rfl: 1   ALPRAZolam  (XANAX ) 1 MG tablet, TAKE 1 TABLET BY MOUTH DAILY AS  NEEDED FOR SEVERE ANXIETY, Disp: 30 tablet, Rfl: 5   amLODipine  (NORVASC ) 2.5 MG tablet, Take 1 tablet (2.5 mg total) by mouth daily. Take with 5 mg to equal total 7.5 mg, Disp: 30 tablet, Rfl: 1   amLODipine  (NORVASC ) 5 MG tablet, TAKE 1 TABLET (5 MG TOTAL) BY MOUTH DAILY., Disp: 90 tablet, Rfl: 1   carbamazepine  (CARBATROL ) 300 MG 12 hr capsule, Take 1 capsule (300 mg total) by mouth every morning AND 2 capsules (600 mg total) at bedtime., Disp: 270 capsule, Rfl: 1   cetirizine (ZYRTEC) 10 MG tablet, Take 10 mg by mouth daily as needed for allergies or rhinitis., Disp: , Rfl:    Cholecalciferol (VITAMIN D3) 1.25 MG (50000 UT) CAPS, Take 1 weekly for 12 weeks, Disp: 12 capsule, Rfl: 0   clonazePAM  (KLONOPIN ) 1 MG tablet, Take 1 tablet (1 mg total) by mouth at bedtime., Disp: 90 tablet, Rfl: 1   Deutetrabenazine  ER (AUSTEDO  XR) 48 MG TB24, Take 48 mg by mouth daily., Disp: 90 tablet, Rfl: 3   docusate sodium (COLACE) 50 MG capsule, Take 50 mg by mouth daily as needed for mild constipation., Disp: , Rfl:    famotidine  (PEPCID ) 40 MG tablet, TAKE 1 TABLET BY MOUTH EVERYDAY AT BEDTIME, Disp: 30 tablet, Rfl: 5   fluticasone  (FLONASE ) 50 MCG/ACT nasal spray, PLACE 1 SPRAY INTO BOTH NOSTRILS DAILY AS NEEDED FOR ALLERGIES OR RHINITIS., Disp: 16 mL, Rfl: 5   ipratropium (ATROVENT ) 0.06 % nasal spray, Place 2 sprays into both nostrils 3 (three) times daily. Use as needed for PND, Disp: 15 mL, Rfl: 12   levETIRAcetam  (KEPPRA ) 500 MG tablet, Take 1 tablet in AM, 1 and 1/2 tablets in PM, Disp: 250 tablet, Rfl: 3   losartan -hydrochlorothiazide (HYZAAR) 100-25 MG tablet, Take 1 tablet by mouth daily., Disp: 90 tablet, Rfl: 3   lovastatin  (MEVACOR ) 20 MG tablet, TAKE 1 TABLET BY MOUTH EVERY DAY, Disp: 90 tablet, Rfl: 3   magic mouthwash w/lidocaine   SOLN, Diphenhydramine  12.5 mg/5 mL 1 part Viscous lidocaine  2% 1 part Maalox 1 part  Swish, hold, and spit or swallow 30 mL TID, Disp: 900 mL, Rfl: 3   Melatonin 10 MG TABS, Take 20 mg by mouth at bedtime., Disp: , Rfl:    metFORMIN  (GLUCOPHAGE ) 500 MG tablet, TAKE 1 TABLET BY MOUTH TWICE A DAY WITH MEALS, Disp: 180 tablet, Rfl: 0   Multiple Vitamins-Minerals (CENTRUM SILVER  PO), Take 1 tablet by mouth 2 (two) times a week., Disp: , Rfl:    OLANZapine  (ZYPREXA ) 10 MG tablet, Take 1 tablet (10 mg  total) by mouth at bedtime., Disp: 90 tablet, Rfl: 1   pantoprazole  (PROTONIX ) 40 MG tablet, Take 1 tablet (40 mg total) by mouth 2 (two) times daily., Disp: 180 tablet, Rfl: 1   polyethylene glycol (MIRALAX  / GLYCOLAX ) 17 g packet, Take 17 g by mouth daily., Disp: , Rfl:    sucralfate  (CARAFATE ) 1 g tablet, Take 1 tablet (1 g total) by mouth 4 (four) times daily -  with meals and at bedtime. Use as needed for cough and throat irritation, Disp: 60 tablet, Rfl: 1   traZODone  (DESYREL ) 100 MG tablet, TAKE 1-2 TABLETS (100-200 MG TOTAL) BY MOUTH AT BEDTIME AS NEEDED. FOR SLEEP, Disp: 180 tablet, Rfl: 3   zolpidem  (AMBIEN ) 10 MG tablet, Take 1 tablet (10 mg total) by mouth at bedtime as needed., Disp: 90 tablet, Rfl: 1   Physical Exam:   BP (!) 151/102 Comment: first attemp 161/101  Pulse 82   Temp 98.1 F (36.7 C)   Ht 5' 6 (1.676 m)   Wt 183 lb (83 kg)   SpO2 96%   BMI 29.54 kg/m   The patient was awake, alert, and appropriate. The external ears were inspected, and otoscopy was performed to evaluate the external auditory canals and tympanic membranes. The nasal cavity and septum were examined for mucosal changes, obstruction, or discharge. The oral cavity and oropharynx were inspected for mucosal lesions, infection, or tonsillar hypertrophy. The neck was palpated for lymphadenopathy, thyroid  abnormalities, or other masses. Cranial nerve function was grossly intact.  Pertinent Findings: Physical  Exam HEENT: Tongue with a healed scar on the right side, full mobility. Nose without abnormalities. EAC clear with normal TM.    Impression & Plans:  Kelvin Burpee is a 68 y.o. female with no lesions or growths along the lateral tongue. There is a well healed scar. She also reported a previous history consistent with BMS but that is different than her current symptoms. Her current symptoms are mild and improving to the point where she almost canceled todays appointment.   1. Painful tongue   2. Burning mouth syndrome    - Findings and diagnoses discussed in detail with the patient. - Risks, benefits, and alternatives were reviewed. Through shared decision making, the patient elects to proceed with below.  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 Possible nerve healing involvement discussed. - Continue magic mouthwash PRN. - Consider capsaicin rinse if symptoms persist or worsen. - Follow up in three months if symptoms persist.   - Orders placed: 0.02% Caspaicin oral rinse, swish 15 mls TID x 1 week [facility] [address]. Called and left voicemail with husband Todd.   Thank you for allowing me the opportunity to care for your patient. Please do not hesitate to contact me should you have any other questions.  Sincerely, Penne Croak, DO Otolaryngologist (ENT) [facility] Phone: [phone] Fax: [phone]  05/28/2024, 1:18 PM

## 2024-05-29 ENCOUNTER — Telehealth (INDEPENDENT_AMBULATORY_CARE_PROVIDER_SITE_OTHER): Payer: Self-pay

## 2024-05-29 NOTE — Telephone Encounter (Signed)
 Pharmacy called and stated they needed to talk to you about the different ways they can do the mouth wash and exactly what way you want them to and they also stated they wanted to go over the price difference with you.   You can call the pharmacy back at [phone]

## 2024-06-07 ENCOUNTER — Ambulatory Visit: Admitting: Psychiatry

## 2024-06-07 DIAGNOSIS — F4310 Post-traumatic stress disorder, unspecified: Secondary | ICD-10-CM | POA: Diagnosis not present

## 2024-06-07 NOTE — Progress Notes (Signed)
      [facility] Counselor/Therapist Progress Note  Patient ID: CLEOTHA WHALIN, MRN: [id number],    Date: 06/07/2024  Time Spent: 50 minutes start time 11:05 AM end time 11:55 AM  Treatment Type: Individual Therapy  Reported Symptoms: anxiety, triggered responses, rumination, flashbacks, disassociative episodes  Mental Status Exam:  Appearance:   Casual     Behavior:  Appropriate  Motor:  Restlestness  Speech/Language:   Normal Rate  Affect:  Appropriate  Mood:  anxious  Thought process:  normal  Thought content:    WNL  Sensory/Perceptual disturbances:    WNL  Orientation:  oriented to person, place, time/date, and situation  Attention:  Good  Concentration:  Good  Memory:  WNL  Fund of knowledge:   Good  Insight:    Good  Judgment:   Good  Impulse Control:  Good   Risk Assessment: Danger to Self:  No Self-injurious Behavior: No Danger to Others: No Duty to Warn:no Physical Aggression / Violence:No  Access to Firearms a concern: No  Gang Involvement:No   Subjective: Patient was present for session. She shared she has been doing pretty well overall. Her daughter is feeling better and only has 2 more treatments and is hanging in there.  She shared that the news has been triggering for her and she has anxiety over things that are going on in the world. Discussed importance of just focusing on the woods and her quiet.Patient shared that her brother passed when he was 41 and she was 40 and her mother told her that she would have preferred that she was the one that passed away. She shared that her brother drank with her mom and she didn't. She was trying to get her parents to stop drinking so that caused some conflict with she and mom.She shared her mother was never truly happy even though she made lots of money. She was nice when she was sober but when she was drunk she was a different. Processed the issues with her mother SUDS 10, negative cognition He should be here instead  of her, felt sadness anxiety stomach.  Patient was able to reduce as level to 5.  Through the processing she was able to recognize that her mother had a lot of guilt over the whole situation because her brother was an addict and she would use with him.  Also patient was making really good decisions so if she would have passed away she what never had the guilt because she knew patient was doing the right things.  Encouraged patient just to allow processing to continue.  Interventions: Eye Movement Desensitization and Reprocessing (EMDR) and Insight-Oriented  Diagnosis:   ICD-10-CM   1. Posttraumatic stress disorder  F43.10       Plan:  Patient is to continue going for walks to release negative emotions appropriately.  Patient is to practice safe place exercise from session to help decrease negative emotions.  She is to journal through writing and art, practice coping skills and DBT skills to work on managing/decreasing triggered responses. She is to take medication as directed and continue meeting with her providers.   Silvano Pacini, [facility]

## 2024-06-21 ENCOUNTER — Ambulatory Visit: Admitting: Psychiatry

## 2024-07-04 ENCOUNTER — Emergency Department (HOSPITAL_COMMUNITY)

## 2024-07-04 ENCOUNTER — Observation Stay (HOSPITAL_COMMUNITY)
Admission: EM | Admit: 2024-07-04 | Discharge: 2024-07-05 | Disposition: A | Discharge status: Home / Self Care | Attending: Hospitalist | Admitting: Hospitalist

## 2024-07-04 ENCOUNTER — Other Ambulatory Visit: Payer: Self-pay

## 2024-07-04 DIAGNOSIS — E782 Mixed hyperlipidemia: Secondary | ICD-10-CM

## 2024-07-04 DIAGNOSIS — F319 Bipolar disorder, unspecified: Secondary | ICD-10-CM | POA: Diagnosis present

## 2024-07-04 DIAGNOSIS — E785 Hyperlipidemia, unspecified: Secondary | ICD-10-CM | POA: Diagnosis not present

## 2024-07-04 DIAGNOSIS — G40909 Epilepsy, unspecified, not intractable, without status epilepticus: Principal | ICD-10-CM | POA: Insufficient documentation

## 2024-07-04 DIAGNOSIS — E8721 Acute metabolic acidosis: Secondary | ICD-10-CM | POA: Diagnosis not present

## 2024-07-04 DIAGNOSIS — Z85828 Personal history of other malignant neoplasm of skin: Secondary | ICD-10-CM | POA: Diagnosis not present

## 2024-07-04 DIAGNOSIS — R569 Unspecified convulsions: Principal | ICD-10-CM

## 2024-07-04 DIAGNOSIS — Z79899 Other long term (current) drug therapy: Secondary | ICD-10-CM | POA: Diagnosis not present

## 2024-07-04 DIAGNOSIS — I10 Essential (primary) hypertension: Secondary | ICD-10-CM | POA: Insufficient documentation

## 2024-07-04 DIAGNOSIS — E8729 Other acidosis: Secondary | ICD-10-CM | POA: Diagnosis present

## 2024-07-04 DIAGNOSIS — K219 Gastro-esophageal reflux disease without esophagitis: Secondary | ICD-10-CM | POA: Diagnosis not present

## 2024-07-04 DIAGNOSIS — D72829 Elevated white blood cell count, unspecified: Secondary | ICD-10-CM | POA: Diagnosis present

## 2024-07-04 DIAGNOSIS — Z8679 Personal history of other diseases of the circulatory system: Secondary | ICD-10-CM

## 2024-07-04 LAB — URINALYSIS, ROUTINE W REFLEX MICROSCOPIC
Bilirubin Urine: NEGATIVE
Glucose, UA: NEGATIVE mg/dL
Ketones, ur: 5 mg/dL — AB
Nitrite: NEGATIVE
Protein, ur: 100 mg/dL — AB
Specific Gravity, Urine: 1.013 (ref 1.005–1.030)
pH: 5 (ref 5.0–8.0)

## 2024-07-04 LAB — COMPREHENSIVE METABOLIC PANEL WITH GFR
ALT: 39 U/L (ref 0–44)
AST: 48 U/L — ABNORMAL HIGH (ref 15–41)
Albumin: 4.4 g/dL (ref 3.5–5.0)
Alkaline Phosphatase: 85 U/L (ref 38–126)
Anion gap: 17 — ABNORMAL HIGH (ref 5–15)
BUN: 8 mg/dL (ref 8–23)
CO2: 19 mmol/L — ABNORMAL LOW (ref 22–32)
Calcium: 9.3 mg/dL (ref 8.9–10.3)
Chloride: 97 mmol/L — ABNORMAL LOW (ref 98–111)
Creatinine, Ser: 1.2 mg/dL — ABNORMAL HIGH (ref 0.44–1.00)
GFR, Estimated: 49 mL/min — ABNORMAL LOW (ref >60)
Glucose, Bld: 184 mg/dL — ABNORMAL HIGH (ref 70–99)
Potassium: 3.9 mmol/L (ref 3.5–5.1)
Sodium: 133 mmol/L — ABNORMAL LOW (ref 135–145)
Total Bilirubin: 0.8 mg/dL (ref 0.0–1.2)
Total Protein: 7.2 g/dL (ref 6.5–8.1)

## 2024-07-04 LAB — CBC WITH DIFFERENTIAL/PLATELET
Abs Immature Granulocytes: 0.14 K/uL — ABNORMAL HIGH (ref 0.00–0.07)
Basophils Absolute: 0 K/uL (ref 0.0–0.1)
Basophils Relative: 0 %
Eosinophils Absolute: 0 K/uL (ref 0.0–0.5)
Eosinophils Relative: 0 %
HCT: 40.6 % (ref 36.0–46.0)
Hemoglobin: 13.8 g/dL (ref 12.0–15.0)
Immature Granulocytes: 1 %
Lymphocytes Relative: 5 %
Lymphs Abs: 0.7 K/uL (ref 0.7–4.0)
MCH: 28.6 pg (ref 26.0–34.0)
MCHC: 34 g/dL (ref 30.0–36.0)
MCV: 84.1 fL (ref 80.0–100.0)
Monocytes Absolute: 0.5 K/uL (ref 0.1–1.0)
Monocytes Relative: 4 %
Neutro Abs: 13.6 K/uL — ABNORMAL HIGH (ref 1.7–7.7)
Neutrophils Relative %: 90 %
Platelets: 325 K/uL (ref 150–400)
RBC: 4.83 MIL/uL (ref 3.87–5.11)
RDW: 13.3 % (ref 11.5–15.5)
WBC: 15 K/uL — ABNORMAL HIGH (ref 4.0–10.5)
nRBC: 0 % (ref 0.0–0.2)

## 2024-07-04 LAB — RAPID URINE DRUG SCREEN, HOSP PERFORMED
Amphetamines: NOT DETECTED
Barbiturates: NOT DETECTED
Benzodiazepines: POSITIVE — AB
Cocaine: NOT DETECTED
Opiates: NOT DETECTED
Tetrahydrocannabinol: NOT DETECTED

## 2024-07-04 LAB — CARBAMAZEPINE LEVEL, TOTAL: Carbamazepine Lvl: 11.3 ug/mL (ref 4.0–12.0)

## 2024-07-04 LAB — ETHANOL: Alcohol, Ethyl (B): <15 mg/dL (ref <15)

## 2024-07-04 LAB — MAGNESIUM: Magnesium: 2.9 mg/dL — ABNORMAL HIGH (ref 1.7–2.4)

## 2024-07-04 MED ORDER — LEVETIRACETAM (KEPPRA) 500 MG/5 ML ADULT IV PUSH
1500.0000 mg | Freq: Once | INTRAVENOUS | Status: AC
  Administered 2024-07-04: 1500 mg via INTRAVENOUS
  Filled 2024-07-04: qty 15

## 2024-07-04 MED ORDER — ONDANSETRON HCL 4 MG/2ML IJ SOLN: 4.0000 mg | Freq: Four times a day (QID) | INTRAMUSCULAR | Status: DC | PRN

## 2024-07-04 MED ORDER — CARBAMAZEPINE ER 200 MG PO TB12
600.0000 mg | ORAL_TABLET | Freq: Every day | ORAL | Status: DC
  Filled 2024-07-04: qty 3

## 2024-07-04 MED ORDER — CLONAZEPAM 1 MG PO TABS
1.0000 mg | ORAL_TABLET | Freq: Every day | ORAL | Status: DC
  Administered 2024-07-05: 1 mg via ORAL
  Filled 2024-07-04: qty 1

## 2024-07-04 MED ORDER — LEVETIRACETAM 500 MG PO TABS
500.0000 mg | ORAL_TABLET | Freq: Every morning | ORAL | Status: DC
  Administered 2024-07-05: 500 mg via ORAL
  Filled 2024-07-04: qty 1

## 2024-07-04 MED ORDER — LEVETIRACETAM 750 MG PO TABS: 750.0000 mg | ORAL_TABLET | Freq: Every day | ORAL | Status: DC

## 2024-07-04 MED ORDER — MELATONIN 3 MG PO TABS: 3.0000 mg | ORAL_TABLET | Freq: Every evening | ORAL | Status: DC | PRN

## 2024-07-04 MED ORDER — CARBAMAZEPINE 200 MG PO TABS
600.0000 mg | ORAL_TABLET | Freq: Once | ORAL | Status: AC
  Administered 2024-07-04: 600 mg via ORAL
  Filled 2024-07-04: qty 3

## 2024-07-04 MED ORDER — LORAZEPAM 2 MG/ML IJ SOLN: 2.0000 mg | Freq: Once | INTRAMUSCULAR | Status: DC | PRN

## 2024-07-04 MED ORDER — CARBAMAZEPINE ER 100 MG PO TB12
300.0000 mg | ORAL_TABLET | Freq: Every morning | ORAL | Status: DC
  Administered 2024-07-05: 300 mg via ORAL
  Filled 2024-07-04: qty 3

## 2024-07-04 NOTE — Plan of Care (Signed)
 Brief Neuro Note:  Pt p/w 3 seizures at home. Non compliance reported by husband and missed meds yesterday. Keppra  levels pending. She is lethargic and confused but following commands with no focal deficits.  Plan is to load with Keppra  1500mg  IV once and resume home Keppra , Carbamazepine  and Klonopin . Observe overnight to monitor for further seizures and if no seizures overnight, can be discharged early morning. Please counsel her and husband over there importance of compliance with seizure medications. No driving for 6 months. Has to be seizure free before she can resume driving.  Follow up with her outpatient neurologist.  Rolla Servidio [facility]

## 2024-07-04 NOTE — ED Triage Notes (Signed)
 Pt BIB [facility] from home. Pt had three seizures today. Pt had seizures at 12, 1430, 1730, all lasting approx 60 to 90 secs. Pt has hx of seizures and takes keppra  on time.  20g LAC   [facility] VS 116/80 95 spO2 RA 130 HR ST 192 CBG

## 2024-07-04 NOTE — H&P (Signed)
 History and Physical      Theresa [name] FMW:[id number] DOB: 1955-10-28 DOA: 07/04/2024; DOS: 07/04/2024  PCP: Watt Harlene BROCKS, MD  Patient coming from: home   I have personally briefly reviewed patient's old medical records in [facility]  Chief Complaint: Seizures  HPI: Theresa [name] is a 68 y.o. female with medical history significant for seizures, essential hypertension, hyperlipidemia, bipolar disorder, who is admitted to [facility] on 07/04/2024 with breakthrough seizures after presenting from home to [facility] ED complaining of seizures.   Following history is provided by the patient as well as the patient's husband in addition to my discussions with the EDP and via chart review.  The patient is a documented history of seizures, with outpatient antiepileptic regimen consisting of Keppra , carbamazepine , and scheduled clonazepam .  Husband conveys that the patient is in becoming progressively more forgetful over the last 6 months, but does not carry a diagnosis of underlying dementia at this time.  However, in this context, the patient has continued to remain responsible for accurately taking at home medications, and husband conveys a suspicion that the patient is missing doses of home medications as a consequence, including missed doses of her home antiepileptic medication regimen.  Specifically, has been conveys strong suspicion that the patient missed multiple of her home antiepileptic medications yesterday.   Today, husband conveys that the patient has exhibited evidence of 3 breakthrough seizures, all occurring at home, noting tonic-clonic activity in all 4 extremities on all three occasions.  These episodes have lasted between 3 to 5 minutes each, and spontaneously resolved in the absence of any prn pharmacologic intervention.  These episodes have not been associate with any tongue biting or any loss of bowel/bladder control.  These episodes are associated with diminished  responsiveness, with ensuing gradual improvement in responses back to baseline before the patient has experienced the next such episode of seizure-like activity.  Typically, husband conveys that, if the patient is to experience a breakthrough seizure as an outpatient, that she will experience only 1 breakthrough seizure per day.  Consequently, husband notes that the patient experiencing a total of 3 seizures over the course of the last days higher than her usual breakthrough volume over 24-hour period, although he does note that on 1 or 2 occasions she has previously experienced 4 seizures in a day.   Leading up to today's seizure-like activity, husband conveys that the patient has been without acute complaint, and in her normal state of health.  Denies any recent subjective fever, chills, rigors.  No recent acute focal weakness or acute focal sensory changes.  No history of alcohol abuse or recent alcohol consumption, and or a reported history of recreational drug abuse.  The patient has had no additional episodes of breakthrough seizure throughout her ED course.   ED Course:  Vital signs in the ED were notable for the following: Afebrile; rates in the 90s to low 100s; systolic blood pressures in the 1 teens to 140s; respiratory rate 18-21, oxygen saturation 98 to 99% on room air.  Labs were notable for the following: CMP was notable for the following: Sodium 133, which corrects to approximately 35 when taking into account concomitant hyperglycemia, potassium 3.9, bicarbonate 19, anion gap 17, creatinine 1.20, glucose 184, liver enzymes within normal limits.  Magnesium level 2.9.  Serum ethanol level less than 15.  Urinary drug screen is positive for benzodiazepines.  CBC notable for white cell count of 15,000 and, hemoglobin 13.8.  Urinalysis showed  no red blood cells and was nitrate negative.  Serum Keppra  and carbamazepine  levels been ordered, with results currently pending.  Per my interpretation,  EKG in ED demonstrated the following: Sinus tachycardia with heart rate 116, normal intervals, no evidence of T wave changes, and the less than 1 mm ST depression in V2, will demonstrate no evidence of ST elevation.  Imaging in the ED, per corresponding formal radiology read, was notable for the following: CT head showed no evidence of acute intracranial process, including evidence of intracranial Atriance of acute infarct.  CT abdomen/pelvis was ordered to further evaluate for any evidence of underlying infectious process that may be contributing to the patient's breakthrough seizures, and showed no evidence of acute intra-abdominal or acute intrapelvic process.  EDP discussed patient's case with on-call neurology, Dr. Vanessa, who conveyed suspicion for underlying suboptimal compliance with home antiepileptic medications, recommending overnight observation, with discharge to home tomorrow if no interval seizures, with follow-up with her outpatient neurologist.  Dr. Vanessa recommended loading dose of Keppra  1.5 g IV x 1, followed by resumption of her home Keppra , carbamazepine , and clonazepam .  Neurology is available for formal consult, as needed.   While in the ED, the following were administered: Carbamazepine  600 mg p.o. x 1 dose, Keppra  1.5 g IV x 1 dose.  Subsequently, the patient was admitted for overnight observation for further evaluation management of breakthrough seizures, with presenting labs also notable for anion gap metabolic acidosis as well as mild leukocytosis.     Review of Systems: As per HPI otherwise 10 point review of systems negative.   Past Medical History:  Diagnosis Date   Adenomatous colon polyp    Allergy    Anxiety    Bipolar 1 disorder (HCC)    Blood transfusion without reported diagnosis    Cancer (HCC) years ago   a skin basil cell bump   Depression    Elevated LFTs    GERD (gastroesophageal reflux disease)    Hepatic steatosis 05/07/2013    Hyperlipidemia    Hypertension    Seizures (HCC) 1977   last seizure 05/2022   Tardive dyskinesia     Past Surgical History:  Procedure Laterality Date   APPENDECTOMY     COLONOSCOPY  06/16/2012   Procedure: COLONOSCOPY;  Surgeon: Princella CHRISTELLA Nida, MD;  Location: [facility];  Service: Endoscopy;  Laterality: N/A;   DILATION AND CURETTAGE OF UTERUS     EYE SURGERY     KNEE ARTHROSCOPY WITH MENISCAL REPAIR Left    NECK SURGERY     plate with screws to 3 cervical spines   SPINE SURGERY      Social History:  reports that she has never smoked. She has never used smokeless tobacco. She reports current alcohol use. She reports that she does not use drugs.   Allergies  Allergen Reactions   Cyclobenzaprine  Other (See Comments)    Blisters in mouth   Lamictal [Lamotrigine]     Acute renal failure   Lybalvi  [Olanzapine -Samidorphan] Other (See Comments)    Nausea, dizziness   Ropinirole  Nausea And Vomiting   Tramadol Nausea And Vomiting    Reaction??   Acetaminophen  Itching, Swelling and Rash   Codeine Swelling, Rash and Other (See Comments)    Can take hydrocodone    Penicillins     Abdominal pain    Family History  Problem Relation Age of Onset   Colon polyps Mother    Cancer Mother    Heart disease Mother  Alcoholism Mother    Hypertension Mother    Alcohol abuse Mother    Early death Mother    Colon polyps Father    Cancer Father    Alcoholism Father    Bipolar disorder Father    Alcohol abuse Father    Drug abuse Brother    Alcohol abuse Brother    Depression Brother    Bipolar disorder Other    Alcohol abuse Sister    Drug abuse Sister    Colon cancer Neg Hx    Stomach cancer Neg Hx    Breast cancer Neg Hx    Esophageal cancer Neg Hx    Rectal cancer Neg Hx     Family history reviewed and not pertinent    Prior to Admission medications   Medication Sig Start Date End Date Taking? Authorizing Provider  ALPRAZolam  (XANAX  XR) 2 MG 24 hr tablet TAKE 1  TABLET BY MOUTH IN THE  MORNING AND AT BEDTIME 07/19/23   Franchot Harlene SQUIBB, PMHNP  ALPRAZolam  (XANAX ) 1 MG tablet TAKE 1 TABLET BY MOUTH DAILY AS  NEEDED FOR SEVERE ANXIETY 06/10/23   Franchot Harlene SQUIBB, PMHNP  amLODipine  (NORVASC ) 2.5 MG tablet Take 1 tablet (2.5 mg total) by mouth daily. Take with 5 mg to equal total 7.5 mg 05/23/24   Copland, Jessica C, MD  amLODipine  (NORVASC ) 5 MG tablet TAKE 1 TABLET (5 MG TOTAL) BY MOUTH DAILY. 05/21/24   Copland, Harlene BROCKS, MD  carbamazepine  (CARBATROL ) 300 MG 12 hr capsule Take 1 capsule (300 mg total) by mouth every morning AND 2 capsules (600 mg total) at bedtime. 06/06/23 04/20/24  Franchot Harlene SQUIBB, PMHNP  cetirizine (ZYRTEC) 10 MG tablet Take 10 mg by mouth daily as needed for allergies or rhinitis.    [provider]  Cholecalciferol (VITAMIN D3) 1.25 MG (50000 UT) CAPS Take 1 weekly for 12 weeks 02/09/23   Copland, Harlene BROCKS, MD  clonazePAM  (KLONOPIN ) 1 MG tablet Take 1 tablet (1 mg total) by mouth at bedtime. 06/13/23 04/20/24  Franchot Harlene SQUIBB, PMHNP  Deutetrabenazine  ER (AUSTEDO  XR) 48 MG TB24 Take 48 mg by mouth daily. 08/01/23   Franchot Harlene SQUIBB, PMHNP  docusate sodium (COLACE) 50 MG capsule Take 50 mg by mouth daily as needed for mild constipation.    [provider]  famotidine  (PEPCID ) 40 MG tablet TAKE 1 TABLET BY MOUTH EVERYDAY AT BEDTIME 05/02/24   Lemmon, Delon Gibson, PA  fluticasone  (FLONASE ) 50 MCG/ACT nasal spray PLACE 1 SPRAY INTO BOTH NOSTRILS DAILY AS NEEDED FOR ALLERGIES OR RHINITIS. 02/20/24   Patel, Kunjan B, MD  ipratropium (ATROVENT ) 0.06 % nasal spray Place 2 sprays into both nostrils 3 (three) times daily. Use as needed for PND 03/21/23   Copland, Harlene BROCKS, MD  levETIRAcetam  (KEPPRA ) 500 MG tablet Take 1 tablet in AM, 1 and 1/2 tablets in PM 04/20/24   Georjean Darice HERO, MD  losartan -hydrochlorothiazide (HYZAAR) 100-25 MG tablet Take 1 tablet by mouth daily. 03/26/24   Copland, Harlene BROCKS, MD  lovastatin  (MEVACOR ) 20  MG tablet TAKE 1 TABLET BY MOUTH EVERY DAY 07/04/23   Copland, Jessica C, MD  magic mouthwash w/lidocaine  SOLN Diphenhydramine  12.5 mg/5 mL 1 part Viscous lidocaine  2% 1 part Maalox 1 part  Swish, hold, and spit or swallow 30 mL TID 04/20/24   Georjean Darice HERO, MD  Melatonin 10 MG TABS Take 20 mg by mouth at bedtime.    [provider]  metFORMIN  (GLUCOPHAGE ) 500 MG tablet  TAKE 1 TABLET BY MOUTH TWICE A DAY WITH MEALS 07/03/23   Franchot Harlene SQUIBB, PMHNP  Multiple Vitamins-Minerals (CENTRUM SILVER  PO) Take 1 tablet by mouth 2 (two) times a week.    [provider]  OLANZapine  (ZYPREXA ) 10 MG tablet Take 1 tablet (10 mg total) by mouth at bedtime. 06/06/23   Franchot Harlene SQUIBB, PMHNP  pantoprazole  (PROTONIX ) 40 MG tablet Take 1 tablet (40 mg total) by mouth 2 (two) times daily. 02/09/24   Copland, Jessica C, MD  polyethylene glycol (MIRALAX  / GLYCOLAX ) 17 g packet Take 17 g by mouth daily.    [provider]  sucralfate  (CARAFATE ) 1 g tablet Take 1 tablet (1 g total) by mouth 4 (four) times daily -  with meals and at bedtime. Use as needed for cough and throat irritation 03/26/24   Copland, Harlene BROCKS, MD  traZODone  (DESYREL ) 100 MG tablet TAKE 1-2 TABLETS (100-200 MG TOTAL) BY MOUTH AT BEDTIME AS NEEDED. FOR SLEEP 05/15/23   Franchot Harlene SQUIBB, PMHNP  zolpidem  (AMBIEN ) 10 MG tablet Take 1 tablet (10 mg total) by mouth at bedtime as needed. 06/06/23   Franchot Harlene SQUIBB, PMHNP     Objective    Physical Exam: Vitals:   07/04/24 1930 07/04/24 2000 07/04/24 2030 07/04/24 2243  BP: 111/82 (!) 144/94 (!) 147/92   Pulse: (!) 104 99 (!) 102   Resp: (!) 21 19 20    Temp:    99.8 F (37.7 C)  TempSrc:    Oral  SpO2: 98% 99% 98%   Weight:      Height:        General: appears to be stated age; awake Skin: warm, dry, no rash Head:  AT/[address] Mouth:  Oral mucosa membranes appear moist, normal dentition Neck: supple; trachea midline Heart:  RRR; did not appreciate any  M/R/G Lungs: CTAB, did not appreciate any wheezes, rales, or rhonchi Abdomen: + BS; soft, ND, NT Vascular: 2+ pedal pulses b/l; 2+ radial pulses b/l Extremities: no peripheral edema, no muscle wasting         Labs on Admission: I have personally reviewed following labs and imaging studies  CBC: Recent Labs  Lab 07/04/24 1915  WBC 15.0*  NEUTROABS 13.6*  HGB 13.8  HCT 40.6  MCV 84.1  PLT 325   Basic Metabolic Panel: Recent Labs  Lab 07/04/24 1915  NA 133*  K 3.9  CL 97*  CO2 19*  GLUCOSE 184*  BUN 8  CREATININE 1.20*  CALCIUM 9.3  MG 2.9*   GFR: Estimated Creatinine Clearance: 48.7 mL/min (A) (by C-G formula based on SCr of 1.2 mg/dL (H)). Liver Function Tests: Recent Labs  Lab 07/04/24 1915  AST 48*  ALT 39  ALKPHOS 85  BILITOT 0.8  PROT 7.2  ALBUMIN 4.4   No results for input(s): LIPASE, AMYLASE in the last 168 hours. No results for input(s): AMMONIA in the last 168 hours. Coagulation Profile: No results for input(s): INR, PROTIME in the last 168 hours. Cardiac Enzymes: No results for input(s): CKTOTAL, CKMB, CKMBINDEX, TROPONINI in the last 168 hours. BNP (last 3 results) No results for input(s): PROBNP in the last 8760 hours. HbA1C: No results for input(s): HGBA1C in the last 72 hours. CBG: No results for input(s): GLUCAP in the last 168 hours. Lipid Profile: No results for input(s): CHOL, HDL, LDLCALC, TRIG, CHOLHDL, LDLDIRECT in the last 72 hours. Thyroid  Function Tests: No results for input(s): TSH, T4TOTAL, FREET4, T3FREE, THYROIDAB in the last 72 hours.  Anemia Panel: No results for input(s): VITAMINB12, FOLATE, FERRITIN, TIBC, IRON, RETICCTPCT in the last 72 hours. Urine analysis:    Component Value Date/Time   COLORURINE YELLOW 07/04/2024 1946   APPEARANCEUR CLOUDY (A) 07/04/2024 1946   LABSPEC 1.013 07/04/2024 1946   PHURINE 5.0 07/04/2024 1946   GLUCOSEU NEGATIVE 07/04/2024  1946   HGBUR MODERATE (A) 07/04/2024 1946   BILIRUBINUR NEGATIVE 07/04/2024 1946   BILIRUBINUR negative 02/07/2023 1130   KETONESUR 5 (A) 07/04/2024 1946   PROTEINUR 100 (A) 07/04/2024 1946   UROBILINOGEN 0.2 02/07/2023 1130   NITRITE NEGATIVE 07/04/2024 1946   LEUKOCYTESUR SMALL (A) 07/04/2024 1946    Radiological Exams on Admission: CT Head Wo Contrast Result Date: 07/04/2024 EXAM: CT HEAD WITHOUT CONTRAST 07/04/2024 08:57:00 PM TECHNIQUE: CT of the head was performed without the administration of intravenous contrast. Automated exposure control, iterative reconstruction, and/or weight based adjustment of the mA/kV was utilized to reduce the radiation dose to as low as reasonably achievable. COMPARISON: 06/14/2022 CLINICAL HISTORY: Mental status change, unknown cause. FINDINGS: BRAIN AND VENTRICLES: No acute hemorrhage. No evidence of acute infarct. No hydrocephalus. No extra-axial collection. No mass effect or midline shift. ORBITS: No acute abnormality. SINUSES: No acute abnormality. SOFT TISSUES AND SKULL: No acute soft tissue abnormality. No skull fracture. IMPRESSION: 1. No acute intracranial abnormality. Electronically signed by: Pinkie Pebbles MD 07/04/2024 09:01 PM EST RP Workstation: [id number]      Assessment/Plan   Principal Problem:   Seizure (HCC) Active Problems:   Bipolar disorder (HCC)   HLD (hyperlipidemia)   High anion gap metabolic acidosis   Leukocytosis   History of essential hypertension   GERD (gastroesophageal reflux disease)      #) Breakthrough tonic-clonic seizures: In the context of a history of seizures, the patient presents with a total of 3 tonic-clonic seizures over the course of the last day, each associated with postictal state that has resolved prior to experiencing the next episode of tonic-clonic activity.  Per history, there is concern for contributory suboptimal compliance with outpatient antiepileptic regimen consisting of Keppra ,  carbamazepine , and scheduled clonazepam .  EDP discussed patient's case with on-call neurology, Dr. Vanessa, who conveyed suspicion for underlying suboptimal compliance with home antiepileptic medications, recommending overnight observation, with discharge to home tomorrow if no interval seizures, with follow-up with her outpatient neurologist.  Dr. Vanessa recommended loading dose of Keppra  1.5 g IV x 1, followed by resumption of her home Keppra , carbamazepine , and clonazepam .  Neurology is available for formal consult, as needed.   Per neurology recommendations, patient has received aforementioned loading dose of IV Keppra , and has received her evening dose of carbamazepine .  No overt evidence of underlying infectious process at this time, including urinalysis that is inconsistent with UTI, while CT abdomen/pelvis showed no evidence of acute intra-abdominal or acute intrapelvic process.  No evidence of meningeal signs to increase index of suspicion for meningitis.  No evidence of acute respiratory symptoms.   No history of alcohol abuse or recent alcohol consumption.  Urine drug screen is positive for benzodiazepines alone, which consistent with her outpatient use of scheduled benzos in the form of clonazepam .  No evidence of acute focal neurologic deficits to suggest underlying acute ischemic stroke as a contributory factor, while noncontrast CT head shows no evidence of acute intracranial process, including no evidence of intracranial injury evidence of acute infarct.  Plan: Resume home Keppra , carbamazepine , and clonazepam , per neurology recommendation, as above.  Precautions ordered.  Seizure precautions.  Follow-up result of  Keppra  level as well as carbamazepine  level.  Ativan  2 mg IV x 1 dose as needed for breakthrough seizures.  Repeat CMP, CBC in the morning.  Counseled the patient on improvement in compliance with her outpatient antiepileptic medications. no driving for 6  months.                  #) Anion gap metabolic acidosis, as noted on presenting CMP.  Suspect that this is on the basis of mild lactic acidosis as a consequence of breakthrough seizures experienced earlier today.  As noted above, no evidence of underlying infectious process.  Plan: Further evaluation management of breakthrough seizures, as above.  Repeat CMP, CBC in the morning.  Monitor strict I's and O's and weights.                          #) Leukocytosis: Presenting CBC reflects mildly elevated white cell count of 15,000. Suspect a reactive element in the context of her presenting multiple breakthrough seizures today. No evidence to suggest underlying infectious process at this time, including urinalysis that is inconsistent UTI, will CT Abdo/pelvis showed no evidence of acute intra-abdominal or intrapelvic process.  No evidence of meningeal signs to suggest meningitis.  No evidence of acute respiratory symptoms to increase index of suspicion for underlying pneumonia.  In the absence of overt underlying infectious process, criteria for sepsis are not currently met.  Appears hemodynamically stable.  Therefore, will refrain from initiation of antibiotics at this time.  Plan: Repeat CBC with diff in the morning.  Monitor strict I's and O's, daily weights.  Repeat CMP in the morning.  Monitor strict I's and O's and daily weights.  Further evaluation management of presenting breakthrough seizures, as above.                       #) Essential Hypertension: documented h/o such, with outpatient antihypertensive regimen including losartan , HCTZ, amlodipine .  SBP's in the ED today: 1 teens to 140s mmHg. will be conservative with resumption of home and I hypertensive medications, as below.  Given presenting borderline low sodium level, will hold next dose of HCTZ.  Plan: Close monitoring of subsequent BP via routine VS. resume home amlodipine .  Hold  home HCTZ and losartan  for now.  Repeat CMP in the morning.  Monitor strict I's and O's and daily weights.                         #) Hyperlipidemia: documented h/o such. On lovastatin  as outpatient.   Plan: continue home statin.                         #) GERD: documented h/o such; on scheduled Pepcid  as well as Protonix  as outpatient.   Plan: continue home PPI and H2 blocker.                        # (History of bipolar disorder: Documented history of such, for which she is on scheduled Zyprexa  on a nightly basis.  Plan: Resume home scheduled Zyprexa .        DVT prophylaxis: SCD's   Code Status: Full code  Family Communication: none Disposition Plan: Per Rounding Team Consults called: EDP discussed patient's case with on-call neurology, Dr. Vanessa, who conveyed suspicion for underlying suboptimal compliance with home antiepileptic medications, recommending overnight observation,  with discharge to home tomorrow if no interval seizures, with follow-up with her outpatient neurologist.  Dr. Vanessa recommended loading dose of Keppra  1.5 g IV x 1, followed by resumption of her home Keppra , carbamazepine , and clonazepam .  Neurology is available for formal consult, as needed. ;  Admission status: observation     I SPENT GREATER THAN 75  MINUTES IN CLINICAL CARE TIME/MEDICAL DECISION-MAKING IN COMPLETING THIS ADMISSION.      Eva NOVAK Nahiem Dredge DO [facility]  From 7PM - 7AM   07/04/2024, 11:06 PM

## 2024-07-04 NOTE — ED Provider Notes (Addendum)
 [place] EMERGENCY DEPARTMENT AT [facility] Provider Note   CSN: [id number] Arrival date & time: 07/04/24  8161     Patient presents with: Seizures   Theresa [name] is a 68 y.o. female.   Patient brought in by EMS.  Patient with a history of known seizures.  Normally on Keppra .  Apparently had 3 seizures today the last 1 about an hour ago and patient still has some confusion.  Heart rate 121 temp 99.3 respirations 18 blood pressure 115/80 oxygen saturation is 93% on room air.  Patient is awake and will follow commands.  No obvious focal deficit.  Pupils equal and reactive to light.  Extraocular muscles intact.  Not clear how the patient was earlier today.  Blood sugar was 192.  Oxygen sats is were 95% on room air according to EMS.  Patient has an allergy to Lamictal.  Patient supposed to be on Keppra  500 mg tablet 1 tablet in the morning 1-1/2 tablet in the evening.  Patient's past medical history for the seizures bipolar disorder hyperlipidemia tardive dyskinesia gastroesophageal reflux disease hypertension past surgical history significant for appendectomy D&C.  Patient is never used tobacco products.       Prior to Admission medications   Medication Sig Start Date End Date Taking? Authorizing Provider  ALPRAZolam  (XANAX  XR) 2 MG 24 hr tablet TAKE 1 TABLET BY MOUTH IN THE  MORNING AND AT BEDTIME 07/19/23   Franchot Harlene SQUIBB, PMHNP  ALPRAZolam  (XANAX ) 1 MG tablet TAKE 1 TABLET BY MOUTH DAILY AS  NEEDED FOR SEVERE ANXIETY 06/10/23   Franchot Harlene SQUIBB, PMHNP  amLODipine  (NORVASC ) 2.5 MG tablet Take 1 tablet (2.5 mg total) by mouth daily. Take with 5 mg to equal total 7.5 mg 05/23/24   Copland, Jessica C, MD  amLODipine  (NORVASC ) 5 MG tablet TAKE 1 TABLET (5 MG TOTAL) BY MOUTH DAILY. 05/21/24   Copland, Harlene BROCKS, MD  carbamazepine  (CARBATROL ) 300 MG 12 hr capsule Take 1 capsule (300 mg total) by mouth every morning AND 2 capsules (600 mg total) at bedtime. 06/06/23 04/20/24   Franchot Harlene SQUIBB, PMHNP  cetirizine (ZYRTEC) 10 MG tablet Take 10 mg by mouth daily as needed for allergies or rhinitis.    [provider]  Cholecalciferol (VITAMIN D3) 1.25 MG (50000 UT) CAPS Take 1 weekly for 12 weeks 02/09/23   Copland, Harlene BROCKS, MD  clonazePAM  (KLONOPIN ) 1 MG tablet Take 1 tablet (1 mg total) by mouth at bedtime. 06/13/23 04/20/24  Franchot Harlene SQUIBB, PMHNP  Deutetrabenazine  ER (AUSTEDO  XR) 48 MG TB24 Take 48 mg by mouth daily. 08/01/23   Franchot Harlene SQUIBB, PMHNP  docusate sodium (COLACE) 50 MG capsule Take 50 mg by mouth daily as needed for mild constipation.    [provider]  famotidine  (PEPCID ) 40 MG tablet TAKE 1 TABLET BY MOUTH EVERYDAY AT BEDTIME 05/02/24   Beather Delon Gibson, PA  fluticasone  (FLONASE ) 50 MCG/ACT nasal spray PLACE 1 SPRAY INTO BOTH NOSTRILS DAILY AS NEEDED FOR ALLERGIES OR RHINITIS. 02/20/24   Patel, Kunjan B, MD  ipratropium (ATROVENT ) 0.06 % nasal spray Place 2 sprays into both nostrils 3 (three) times daily. Use as needed for PND 03/21/23   Copland, Harlene BROCKS, MD  levETIRAcetam  (KEPPRA ) 500 MG tablet Take 1 tablet in AM, 1 and 1/2 tablets in PM 04/20/24   Georjean Darice HERO, MD  losartan -hydrochlorothiazide (HYZAAR) 100-25 MG tablet Take 1 tablet by mouth daily. 03/26/24   Copland, Harlene BROCKS, MD  lovastatin  (  MEVACOR ) 20 MG tablet TAKE 1 TABLET BY MOUTH EVERY DAY 07/04/23   Copland, Harlene BROCKS, MD  magic mouthwash w/lidocaine  SOLN Diphenhydramine  12.5 mg/5 mL 1 part Viscous lidocaine  2% 1 part Maalox 1 part  Swish, hold, and spit or swallow 30 mL TID 04/20/24   Georjean Darice HERO, MD  Melatonin 10 MG TABS Take 20 mg by mouth at bedtime.    [provider]  metFORMIN  (GLUCOPHAGE ) 500 MG tablet TAKE 1 TABLET BY MOUTH TWICE A DAY WITH MEALS 07/03/23   Franchot Harlene SQUIBB, PMHNP  Multiple Vitamins-Minerals (CENTRUM SILVER  PO) Take 1 tablet by mouth 2 (two) times a week.    [provider]  OLANZapine  (ZYPREXA ) 10 MG tablet Take 1  tablet (10 mg total) by mouth at bedtime. 06/06/23   Franchot Harlene SQUIBB, PMHNP  pantoprazole  (PROTONIX ) 40 MG tablet Take 1 tablet (40 mg total) by mouth 2 (two) times daily. 02/09/24   Copland, Jessica C, MD  polyethylene glycol (MIRALAX  / GLYCOLAX ) 17 g packet Take 17 g by mouth daily.    [provider]  sucralfate  (CARAFATE ) 1 g tablet Take 1 tablet (1 g total) by mouth 4 (four) times daily -  with meals and at bedtime. Use as needed for cough and throat irritation 03/26/24   Copland, Harlene BROCKS, MD  traZODone  (DESYREL ) 100 MG tablet TAKE 1-2 TABLETS (100-200 MG TOTAL) BY MOUTH AT BEDTIME AS NEEDED. FOR SLEEP 05/15/23   Franchot Harlene SQUIBB, PMHNP  zolpidem  (AMBIEN ) 10 MG tablet Take 1 tablet (10 mg total) by mouth at bedtime as needed. 06/06/23   Franchot Harlene SQUIBB, PMHNP    Allergies: Cyclobenzaprine , Lamictal [lamotrigine], Lybalvi  [olanzapine -samidorphan], Ropinirole , Tramadol, Acetaminophen , Codeine, and Penicillins    Review of Systems  Unable to perform ROS: Mental status change    Updated Vital Signs BP 115/80   Pulse (!) 121   Temp 99.3 F (37.4 C) (Oral)   Resp 18   Ht 1.676 m (5' 6)   Wt 83 kg   SpO2 93%   BMI 29.53 kg/m   Physical Exam Vitals and nursing note reviewed.  Constitutional:      General: She is not in acute distress.    Appearance: Normal appearance. She is well-developed.  HENT:     Head: Normocephalic and atraumatic.  Eyes:     Extraocular Movements: Extraocular movements intact.     Conjunctiva/sclera: Conjunctivae normal.     Pupils: Pupils are equal, round, and reactive to light.  Cardiovascular:     Rate and Rhythm: Normal rate and regular rhythm.     Heart sounds: No murmur heard. Pulmonary:     Effort: Pulmonary effort is normal. No respiratory distress.     Breath sounds: Normal breath sounds.  Abdominal:     Palpations: Abdomen is soft.     Tenderness: There is no abdominal tenderness.  Musculoskeletal:        General: No swelling.      Cervical back: Normal range of motion and neck supple. No rigidity.  Skin:    General: Skin is warm and dry.     Capillary Refill: Capillary refill takes less than 2 seconds.  Neurological:     General: No focal deficit present.     Mental Status: She is alert.     Cranial Nerves: No cranial nerve deficit.     Sensory: No sensory deficit.     Motor: No weakness.  Psychiatric:        Mood and  Affect: Mood normal.     (all labs ordered are listed, but only abnormal results are displayed) Labs Reviewed - No data to display  EKG: EKG Interpretation Date/Time:  Wednesday July 04 2024 18:47:26 EST Ventricular Rate:  116 PR Interval:  166 QRS Duration:  97 QT Interval:  336 QTC Calculation: 467 R Axis:   16  Text Interpretation: Sinus tachycardia Minimal ST depression, lateral leads No previous ECGs available Confirmed by Montine Hight [phone]) on 07/04/2024 6:57:05 PM  Radiology: No results found.   Procedures   Medications Ordered in the ED - No data to display                                  Medical Decision Making Amount and/or Complexity of Data Reviewed Labs: ordered. Radiology: ordered.  Risk Prescription drug management. Decision regarding hospitalization.   Patient has IV established we will continue cardiac monitoring.  Will get labs to include magnesium.  Patient most likely is going to require admission.  May require head CT as well.  Will get a Keppra  level.  Will go ahead and load with some Keppra .  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  CBC white count is 15 hemoglobin 13 platelets 323.  Alcohol level less than 15 Keppra  level pending.  Magnesium pending.  Complete metabolic panel sodium 133 glucose 184 potassium normal 3.9 total bili normal GFR 49 LFTs normal.  Magnesium is still pending.  Head CT pending.  Urinalysis RBCs 21-50 white blood cells 0-5.  It is cloudy bacteria is rare.  CT head without  any acute findings.  Will order CT renal but will talk to neurology about the recurrent seizures.  Discussed with neurology.  Recommending getting a carbamazepine  level and giving her 600 mg evening dose and her Klonopin  evening dose.  And medicine admission for observation.   Final diagnoses:  Seizure [facility])    ED Discharge Orders     None          Geraldene Hamilton, MD 07/04/24 1857    Geraldene Hamilton, MD 07/04/24 JACKEY Geraldene Hamilton, MD 07/04/24 2051    Geraldene Hamilton, MD 07/04/24 7785    Geraldene Hamilton, MD 07/04/24 2229

## 2024-07-05 ENCOUNTER — Encounter: Payer: Self-pay | Admitting: Neurology

## 2024-07-05 ENCOUNTER — Telehealth: Payer: Self-pay | Admitting: Neurology

## 2024-07-05 ENCOUNTER — Encounter (HOSPITAL_COMMUNITY): Payer: Self-pay | Admitting: Internal Medicine

## 2024-07-05 ENCOUNTER — Ambulatory Visit: Admitting: Psychiatry

## 2024-07-05 DIAGNOSIS — K219 Gastro-esophageal reflux disease without esophagitis: Secondary | ICD-10-CM | POA: Diagnosis present

## 2024-07-05 DIAGNOSIS — R569 Unspecified convulsions: Secondary | ICD-10-CM | POA: Diagnosis not present

## 2024-07-05 DIAGNOSIS — E8729 Other acidosis: Secondary | ICD-10-CM | POA: Diagnosis present

## 2024-07-05 DIAGNOSIS — Z8679 Personal history of other diseases of the circulatory system: Secondary | ICD-10-CM

## 2024-07-05 DIAGNOSIS — D72829 Elevated white blood cell count, unspecified: Secondary | ICD-10-CM | POA: Diagnosis present

## 2024-07-05 LAB — COMPREHENSIVE METABOLIC PANEL WITH GFR
ALT: 36 U/L (ref 0–44)
AST: 45 U/L — ABNORMAL HIGH (ref 15–41)
Albumin: 4.3 g/dL (ref 3.5–5.0)
Alkaline Phosphatase: 83 U/L (ref 38–126)
Anion gap: 13 (ref 5–15)
BUN: 8 mg/dL (ref 8–23)
CO2: 23 mmol/L (ref 22–32)
Calcium: 9.3 mg/dL (ref 8.9–10.3)
Chloride: 97 mmol/L — ABNORMAL LOW (ref 98–111)
Creatinine, Ser: 1.38 mg/dL — ABNORMAL HIGH (ref 0.44–1.00)
GFR, Estimated: 42 mL/min — ABNORMAL LOW (ref >60)
Glucose, Bld: 130 mg/dL — ABNORMAL HIGH (ref 70–99)
Potassium: 3.3 mmol/L — ABNORMAL LOW (ref 3.5–5.1)
Sodium: 133 mmol/L — ABNORMAL LOW (ref 135–145)
Total Bilirubin: 0.8 mg/dL (ref 0.0–1.2)
Total Protein: 7.4 g/dL (ref 6.5–8.1)

## 2024-07-05 LAB — URINALYSIS, COMPLETE (UACMP) WITH MICROSCOPIC
Bilirubin Urine: NEGATIVE
Glucose, UA: NEGATIVE mg/dL
Ketones, ur: NEGATIVE mg/dL
Leukocytes,Ua: NEGATIVE
Nitrite: NEGATIVE
Protein, ur: 100 mg/dL — AB
Specific Gravity, Urine: 1.008 (ref 1.005–1.030)
pH: 5 (ref 5.0–8.0)

## 2024-07-05 LAB — CBC WITH DIFFERENTIAL/PLATELET
Abs Immature Granulocytes: 0.06 K/uL (ref 0.00–0.07)
Basophils Absolute: 0 K/uL (ref 0.0–0.1)
Basophils Relative: 0 %
Eosinophils Absolute: 0 K/uL (ref 0.0–0.5)
Eosinophils Relative: 0 %
HCT: 40.2 % (ref 36.0–46.0)
Hemoglobin: 14 g/dL (ref 12.0–15.0)
Immature Granulocytes: 0 %
Lymphocytes Relative: 8 %
Lymphs Abs: 1.1 K/uL (ref 0.7–4.0)
MCH: 28.6 pg (ref 26.0–34.0)
MCHC: 34.8 g/dL (ref 30.0–36.0)
MCV: 82.2 fL (ref 80.0–100.0)
Monocytes Absolute: 1.4 K/uL — ABNORMAL HIGH (ref 0.1–1.0)
Monocytes Relative: 10 %
Neutro Abs: 11.3 K/uL — ABNORMAL HIGH (ref 1.7–7.7)
Neutrophils Relative %: 82 %
Platelets: 302 K/uL (ref 150–400)
RBC: 4.89 MIL/uL (ref 3.87–5.11)
RDW: 13.6 % (ref 11.5–15.5)
WBC: 13.9 K/uL — ABNORMAL HIGH (ref 4.0–10.5)
nRBC: 0 % (ref 0.0–0.2)

## 2024-07-05 LAB — MAGNESIUM: Magnesium: 2.6 mg/dL — ABNORMAL HIGH (ref 1.7–2.4)

## 2024-07-05 MED ORDER — AMLODIPINE BESYLATE 5 MG PO TABS: 5.0000 mg | ORAL_TABLET | Freq: Every day | ORAL | Status: DC

## 2024-07-05 MED ORDER — PANTOPRAZOLE SODIUM 40 MG PO TBEC
40.0000 mg | DELAYED_RELEASE_TABLET | Freq: Two times a day (BID) | ORAL | Status: DC
  Administered 2024-07-05: 40 mg via ORAL
  Filled 2024-07-05: qty 1

## 2024-07-05 MED ORDER — OLANZAPINE 2.5 MG PO TABS: 10.0000 mg | ORAL_TABLET | Freq: Every day | ORAL | Status: DC

## 2024-07-05 MED ORDER — HYDRALAZINE HCL 20 MG/ML IJ SOLN: 10.0000 mg | INTRAMUSCULAR | Status: DC | PRN

## 2024-07-05 MED ORDER — IBUPROFEN 200 MG PO TABS
200.0000 mg | ORAL_TABLET | Freq: Four times a day (QID) | ORAL | Status: DC | PRN
  Administered 2024-07-05: 200 mg via ORAL
  Filled 2024-07-05: qty 1

## 2024-07-05 MED ORDER — SODIUM CHLORIDE 0.9 % IV SOLN: INTRAVENOUS | Status: AC

## 2024-07-05 MED ORDER — AMLODIPINE BESYLATE 2.5 MG PO TABS
7.5000 mg | ORAL_TABLET | Freq: Every day | ORAL | Status: DC
  Administered 2024-07-05: 7.5 mg via ORAL
  Filled 2024-07-05: qty 3

## 2024-07-05 MED ORDER — POTASSIUM CHLORIDE CRYS ER 20 MEQ PO TBCR
40.0000 meq | EXTENDED_RELEASE_TABLET | Freq: Once | ORAL | Status: AC
  Administered 2024-07-05: 40 meq via ORAL
  Filled 2024-07-05: qty 2

## 2024-07-05 MED ORDER — PRAVASTATIN SODIUM 40 MG PO TABS: 20.0000 mg | ORAL_TABLET | Freq: Every day | ORAL | Status: DC

## 2024-07-05 MED ORDER — FAMOTIDINE 20 MG PO TABS: 40.0000 mg | ORAL_TABLET | Freq: Every day | ORAL | Status: DC

## 2024-07-05 NOTE — Care Management Obs Status (Signed)
 MEDICARE OBSERVATION STATUS NOTIFICATION   Patient Details  Name: Theresa [name] MRN: [id number] Date of Birth: 07/27/1956   Medicare Observation Status Notification Given:  Yes Obs signed and copy provided.    Artavis Cowie 07/05/2024, 12:00 PM

## 2024-07-05 NOTE — Plan of Care (Signed)
   Problem: Clinical Measurements: Goal: Ability to maintain clinical measurements within normal limits will improve Outcome: Progressing Goal: Diagnostic test results will improve Outcome: Progressing   Problem: Activity: Goal: Risk for activity intolerance will decrease Outcome: Progressing   Problem: Nutrition: Goal: Adequate nutrition will be maintained Outcome: Progressing

## 2024-07-05 NOTE — Telephone Encounter (Signed)
 Spoke with pt husband Dr Georjean doesn't  see any openings for now, but will put her on waitlist Dr Georjean reviewed notes and there was concern that she may have missed medication, pls make sure she has her meds, she usually has a good system for taking them. Pt husband will make sure that she is taken her medication

## 2024-07-05 NOTE — Telephone Encounter (Signed)
 Pt is in ED had seizures and would to like to know if she needs to be seen after release

## 2024-07-05 NOTE — Telephone Encounter (Signed)
 I don't see any openings for now, but will put her on waitlist. I reviewed notes and there was concern that she may have missed medication, pls make sure she has her meds, she usually has a good system for taking them. Thanks

## 2024-07-05 NOTE — Plan of Care (Signed)

## 2024-07-05 NOTE — Plan of Care (Signed)
  Problem: Education: Goal: Knowledge of General Education information will improve Description: Including pain rating scale, medication(s)/side effects and non-pharmacologic comfort measures 07/05/2024 1434 by Jenel Bobetta SAILOR, RN Outcome: Adequate for Discharge 07/05/2024 1117 by Jenel Bobetta SAILOR, RN Outcome: Progressing   Problem: Health Behavior/Discharge Planning: Goal: Ability to manage health-related needs will improve 07/05/2024 1434 by Jenel Bobetta SAILOR, RN Outcome: Adequate for Discharge 07/05/2024 1117 by Jenel Bobetta SAILOR, RN Outcome: Progressing   Problem: Clinical Measurements: Goal: Ability to maintain clinical measurements within normal limits will improve 07/05/2024 1434 by Jenel Bobetta SAILOR, RN Outcome: Adequate for Discharge 07/05/2024 1117 by Jenel Bobetta SAILOR, RN Outcome: Progressing Goal: Will remain free from infection 07/05/2024 1434 by Jenel Bobetta SAILOR, RN Outcome: Adequate for Discharge 07/05/2024 1117 by Jenel Bobetta SAILOR, RN Outcome: Progressing Goal: Diagnostic test results will improve 07/05/2024 1434 by Jenel Bobetta SAILOR, RN Outcome: Adequate for Discharge 07/05/2024 1117 by Jenel Bobetta SAILOR, RN Outcome: Progressing Goal: Respiratory complications will improve 07/05/2024 1434 by Jenel Bobetta SAILOR, RN Outcome: Adequate for Discharge 07/05/2024 1117 by Jenel Bobetta SAILOR, RN Outcome: Progressing Goal: Cardiovascular complication will be avoided 07/05/2024 1434 by Jenel Bobetta SAILOR, RN Outcome: Adequate for Discharge 07/05/2024 1117 by Jenel Bobetta SAILOR, RN Outcome: Progressing   Problem: Activity: Goal: Risk for activity intolerance will decrease 07/05/2024 1434 by Jenel Bobetta SAILOR, RN Outcome: Adequate for Discharge 07/05/2024 1117 by Jenel Bobetta SAILOR, RN Outcome: Progressing   Problem: Nutrition: Goal: Adequate nutrition will be maintained 07/05/2024 1434 by Jenel Bobetta SAILOR, RN Outcome: Adequate for Discharge 07/05/2024 1117 by Jenel Bobetta SAILOR, RN Outcome:  Progressing   Problem: Coping: Goal: Level of anxiety will decrease 07/05/2024 1434 by Jenel Bobetta SAILOR, RN Outcome: Adequate for Discharge 07/05/2024 1117 by Jenel Bobetta SAILOR, RN Outcome: Progressing   Problem: Elimination: Goal: Will not experience complications related to bowel motility 07/05/2024 1434 by Jenel Bobetta SAILOR, RN Outcome: Adequate for Discharge 07/05/2024 1117 by Jenel Bobetta SAILOR, RN Outcome: Progressing Goal: Will not experience complications related to urinary retention 07/05/2024 1434 by Jenel Bobetta SAILOR, RN Outcome: Adequate for Discharge 07/05/2024 1117 by Jenel Bobetta SAILOR, RN Outcome: Progressing   Problem: Pain Managment: Goal: General experience of comfort will improve and/or be controlled 07/05/2024 1434 by Jenel Bobetta SAILOR, RN Outcome: Adequate for Discharge 07/05/2024 1117 by Jenel Bobetta SAILOR, RN Outcome: Progressing   Problem: Safety: Goal: Ability to remain free from injury will improve 07/05/2024 1434 by Jenel Bobetta SAILOR, RN Outcome: Adequate for Discharge 07/05/2024 1117 by Jenel Bobetta SAILOR, RN Outcome: Progressing   Problem: Skin Integrity: Goal: Risk for impaired skin integrity will decrease 07/05/2024 1434 by Jenel Bobetta SAILOR, RN Outcome: Adequate for Discharge 07/05/2024 1117 by Jenel Bobetta SAILOR, RN Outcome: Progressing

## 2024-07-05 NOTE — TOC Transition Note (Signed)
 Transition of Care [facility]) - Discharge Note   Patient Details  Name: [name] MRN: [id number] Date of Birth: [birth year]  Transition of Care [facility]) CM/SW Contact:  Andrez JULIANNA George, RN Phone Number: 07/05/2024, 3:26 PM   Clinical Narrative:     Pt is discharging home with self care. No needs per IP Care management.  Final next level of care: Home/Self Care Barriers to Discharge: No Barriers Identified   Patient Goals and CMS Choice            Discharge Placement                       Discharge Plan and Services Additional resources added to the After Visit Summary for                                       Social Drivers of Health (SDOH) Interventions SDOH Screenings   Food Insecurity: No Food Insecurity (07/05/2024)  Housing: Low Risk  (10/10/2023)  Transportation Needs: No Transportation Needs (10/10/2023)  Utilities: Not At Risk (10/14/2023)  Alcohol Screen: Low Risk  (10/10/2023)  Depression (PHQ2-9): Medium Risk (03/26/2024)  Financial Resource Strain: Low Risk  (10/10/2023)  Physical Activity: Sufficiently Active (10/10/2023)  Social Connections: Socially Isolated (10/10/2023)  Stress: No Stress Concern Present (10/10/2023)  Tobacco Use: Low Risk  (07/05/2024)  Health Literacy: Adequate Health Literacy (10/14/2023)     Readmission Risk Interventions     No data to display

## 2024-07-05 NOTE — Discharge Summary (Signed)
 Physician Discharge Summary  Theresa [name] FMW:[id number] DOB: [birth year] DOA: 07/04/2024  PCP: Watt Harlene BROCKS, MD  Admit date: 07/04/2024 Discharge date: 07/05/2024  Admitted From: Home Disposition: Home  Recommendations for Outpatient Follow-up:  Follow up with PCP in 1 week Follow up with primary neurology in 1-2 weeks Follow up in ED if symptoms worsen or new appear   Home Health: No Equipment/Devices: None  Discharge Condition: Stable CODE STATUS: Full Diet recommendation: Heart healthy  Brief/Interim Summary:  Theresa [name] is a 68 y.o. female with medical history significant for seizures, essential hypertension, hyperlipidemia, bipolar disorder, who is admitted to [facility] on 07/04/2024 with breakthrough seizures after presenting from home to [facility] ED complaining of seizures.    Following history is provided by the patient as well as the patient's husband in addition to my discussions with the EDP and via chart review.   The patient is a documented history of seizures, with outpatient antiepileptic regimen consisting of Keppra , carbamazepine , and scheduled clonazepam .  Husband conveys that the patient is in becoming progressively more forgetful over the last 6 months, but does not carry a diagnosis of underlying dementia at this time.  However, in this context, the patient has continued to remain responsible for accurately taking at home medications, and husband conveys a suspicion that the patient is missing doses of home medications as a consequence, including missed doses of her home antiepileptic medication regimen.  Specifically, has been conveys strong suspicion that the patient missed multiple of her home antiepileptic medications a day prior to admission.   Husband conveyed that the patient has exhibited evidence of 3 breakthrough seizures, all occurring at home, noting tonic-clonic activity in all 4 extremities on all three occasions.  These episodes have  lasted between 3 to 5 minutes each, and spontaneously resolved in the absence of any prn pharmacologic intervention.    No history of alcohol abuse or recent alcohol consumption, and or a reported history of recreational drug abuse.  Pt has had breakthrough seizures in the past and doesn't drive. Husband notes cognitive impairment in the past year or so.  CT head in the ER was negative. Pt was loaded with keppra . She was discharged home after overnight observation.  Pt and husband were counseled about the importance of medication compliance. Also discussed about follow up with primary neurology in 1-2 weeks.   Discharge Diagnoses:  Principal Problem:   Seizure (HCC) Active Problems:   Bipolar disorder (HCC)   HLD (hyperlipidemia)   High anion gap metabolic acidosis   Leukocytosis   History of essential hypertension   GERD (gastroesophageal reflux disease)    Discharge Instructions  Discharge Instructions     Diet - low sodium heart healthy   Complete by: As directed    Discharge instructions   Complete by: As directed    1.  Take home medications as you have been taking prior to admission.  Make sure not to miss any antiepileptic meds. 2.  Return to the ER if recurrence of symptoms 3.  Follow-up with PCP in 1 week.  Call neurology for follow-up in next 1 to 2 weeks   Increase activity slowly   Complete by: As directed       Allergies as of 07/05/2024       Reactions   Flexeril  [cyclobenzaprine ] Other (See Comments)   Mouth blisters/ulcers   Lamictal [lamotrigine] Other (See Comments)   Acute renal failure   Lybalvi  [olanzapine -samidorphan] Nausea Only, Other (See Comments)  Dizziness    Ropinirole  Nausea And Vomiting   Ultram [tramadol] Nausea And Vomiting   Codeine Swelling, Rash   OK to take hydrocodone /oxycodone with no reaction   Penicillins Other (See Comments)   Abdominal pain   Tylenol  [acetaminophen ] Itching, Swelling, Rash        Medication List      TAKE these medications    ALPRAZolam  1 MG tablet Commonly known as: XANAX  TAKE 1 TABLET BY MOUTH DAILY AS  NEEDED FOR SEVERE ANXIETY   ALPRAZolam  2 MG 24 hr tablet Commonly known as: XANAX  XR TAKE 1 TABLET BY MOUTH IN THE  MORNING AND AT BEDTIME   amLODipine  5 MG tablet Commonly known as: NORVASC  TAKE 1 TABLET (5 MG TOTAL) BY MOUTH DAILY.   amLODipine  2.5 MG tablet Commonly known as: NORVASC  Take 1 tablet (2.5 mg total) by mouth daily. Take with 5 mg to equal total 7.5 mg   Austedo  XR 48 MG Tb24 Generic drug: Deutetrabenazine  ER Take 48 mg by mouth daily.   carbamazepine  300 MG 12 hr capsule Commonly known as: CARBATROL  Take 1 capsule (300 mg total) by mouth every morning AND 2 capsules (600 mg total) at bedtime.   cetirizine 10 MG tablet Commonly known as: ZYRTEC Take 10 mg by mouth daily as needed for allergies.   famotidine  40 MG tablet Commonly known as: PEPCID  TAKE 1 TABLET BY MOUTH EVERYDAY AT BEDTIME   fluticasone  50 MCG/ACT nasal spray Commonly known as: FLONASE  PLACE 1 SPRAY INTO BOTH NOSTRILS DAILY AS NEEDED FOR ALLERGIES OR RHINITIS.   ibuprofen  200 MG tablet Commonly known as: ADVIL  Take 400 mg by mouth 2 (two) times daily as needed for headache (pain).   levETIRAcetam  500 MG tablet Commonly known as: KEPPRA  Take 1 tablet in AM, 1 and 1/2 tablets in PM What changed:  how much to take how to take this when to take this additional instructions   losartan -hydrochlorothiazide 100-25 MG tablet Commonly known as: HYZAAR Take 1 tablet by mouth daily.   lovastatin  20 MG tablet Commonly known as: MEVACOR  TAKE 1 TABLET BY MOUTH EVERY DAY What changed: when to take this   MELATONIN PO Take 2 tablets by mouth at bedtime.   metFORMIN  500 MG tablet Commonly known as: GLUCOPHAGE  TAKE 1 TABLET BY MOUTH TWICE A DAY WITH MEALS   OLANZapine  10 MG tablet Commonly known as: ZYPREXA  Take 1 tablet (10 mg total) by mouth at bedtime.   pantoprazole  40  MG tablet Commonly known as: PROTONIX  Take 1 tablet (40 mg total) by mouth 2 (two) times daily. What changed:  when to take this additional instructions   traZODone  100 MG tablet Commonly known as: DESYREL  TAKE 1-2 TABLETS (100-200 MG TOTAL) BY MOUTH AT BEDTIME AS NEEDED. FOR SLEEP What changed:  how much to take when to take this additional instructions   VITAMIN B-12 PO Take 1 tablet by mouth daily.   zolpidem  10 MG tablet Commonly known as: AMBIEN  Take 1 tablet (10 mg total) by mouth at bedtime as needed. What changed: when to take this        Follow-up Information     Copland, Harlene BROCKS, MD. Schedule an appointment as soon as possible for a visit in 3 day(s).   Specialty: Family Medicine Contact information: [address] [place] [address] [phone]         Georjean Darice HERO, MD. Schedule an appointment as soon as possible for a visit in 1 week(s).   Specialty: Neurology  Contact information: [address] [place] [address] [phone]                Allergies  Allergen Reactions   Flexeril  [Cyclobenzaprine ] Other (See Comments)    Mouth blisters/ulcers   Lamictal [Lamotrigine] Other (See Comments)    Acute renal failure   Lybalvi  [Olanzapine -Samidorphan] Nausea Only and Other (See Comments)    Dizziness    Ropinirole  Nausea And Vomiting   Ultram [Tramadol] Nausea And Vomiting   Codeine Swelling and Rash    OK to take hydrocodone /oxycodone with no reaction   Penicillins Other (See Comments)    Abdominal pain   Tylenol  [Acetaminophen ] Itching, Swelling and Rash    Consultations:    Procedures/Studies: CT Renal Stone Study Result Date: 07/04/2024 EXAM: CT ABDOMEN AND PELVIS WITHOUT CONTRAST 07/04/2024 11:06:38 PM TECHNIQUE: CT of the abdomen and pelvis was performed without the administration of intravenous contrast. Multiplanar reformatted images are provided for review. Automated exposure control, iterative  reconstruction, and/or weight-based adjustment of the mA/kV was utilized to reduce the radiation dose to as low as reasonably achievable. COMPARISON: None available. CLINICAL HISTORY: Abdominal/flank pain, stone suspected. FINDINGS: LOWER CHEST: No acute abnormality. LIVER: Subcentimeter right hepatic cyst, benign. GALLBLADDER AND BILE DUCTS: Gallbladder is unremarkable. No biliary ductal dilatation. SPLEEN: No acute abnormality. PANCREAS: No acute abnormality. ADRENAL GLANDS: No acute abnormality. KIDNEYS, URETERS AND BLADDER: Right lower pole renal sinus cyst, benign. Per consensus, no follow-up is needed for simple Bosniak type 1 and 2 renal cysts, unless the patient has a malignancy history or risk factors. No stones in the kidneys or ureters. No hydronephrosis. No perinephric or periureteral stranding. Urinary bladder is unremarkable. GI AND BOWEL: Stomach demonstrates no acute abnormality. There is no bowel obstruction. PERITONEUM AND RETROPERITONEUM: No ascites. No free air. VASCULATURE: Aorta is normal in caliber. Mild atherosclerotic calcifications of the abdominal aorta. LYMPH NODES: No lymphadenopathy. REPRODUCTIVE ORGANS: Calcified uterine fibroids. BONES AND SOFT TISSUES: No acute osseous abnormality. No focal soft tissue abnormality. IMPRESSION: 1. No acute findings in the abdomen or pelvis. Electronically signed by: Pinkie Pebbles MD 07/04/2024 11:11 PM EST RP Workstation: [id number]   CT Head Wo Contrast Result Date: 07/04/2024 EXAM: CT HEAD WITHOUT CONTRAST 07/04/2024 08:57:00 PM TECHNIQUE: CT of the head was performed without the administration of intravenous contrast. Automated exposure control, iterative reconstruction, and/or weight based adjustment of the mA/kV was utilized to reduce the radiation dose to as low as reasonably achievable. COMPARISON: 06/14/2022 CLINICAL HISTORY: Mental status change, unknown cause. FINDINGS: BRAIN AND VENTRICLES: No acute hemorrhage. No evidence of acute  infarct. No hydrocephalus. No extra-axial collection. No mass effect or midline shift. ORBITS: No acute abnormality. SINUSES: No acute abnormality. SOFT TISSUES AND SKULL: No acute soft tissue abnormality. No skull fracture. IMPRESSION: 1. No acute intracranial abnormality. Electronically signed by: Pinkie Pebbles MD 07/04/2024 09:01 PM EST RP Workstation: [id number]      Subjective:   Discharge Exam: Vitals:   07/05/24 0810 07/05/24 1100  BP: (!) 167/91 (!) 171/102  Pulse: (!) 115 (!) 109  Resp: 18 19  Temp: 98.4 F (36.9 C) 100 F (37.8 C)  SpO2: 95% 96%    General: Pt is alert, awake, not in acute distress Cardiovascular: rate controlled, S1/S2 + Respiratory: bilateral decreased breath sounds at bases Abdominal: Soft, NT, ND, bowel sounds + Extremities: no edema, no cyanosis    The results of significant diagnostics from this hospitalization (including imaging, microbiology, ancillary and laboratory) are listed below for  reference.     Microbiology: No results found for this or any previous visit (from the past 240 hours).   Labs: BNP (last 3 results) No results for input(s): BNP in the last 8760 hours. Basic Metabolic Panel: Recent Labs  Lab 07/04/24 1915 07/05/24 0454  NA 133* 133*  K 3.9 3.3*  CL 97* 97*  CO2 19* 23  GLUCOSE 184* 130*  BUN 8 8  CREATININE 1.20* 1.38*  CALCIUM 9.3 9.3  MG 2.9* 2.6*   Liver Function Tests: Recent Labs  Lab 07/04/24 1915 07/05/24 0454  AST 48* 45*  ALT 39 36  ALKPHOS 85 83  BILITOT 0.8 0.8  PROT 7.2 7.4  ALBUMIN 4.4 4.3   No results for input(s): LIPASE, AMYLASE in the last 168 hours. No results for input(s): AMMONIA in the last 168 hours. CBC: Recent Labs  Lab 07/04/24 1915 07/05/24 0454  WBC 15.0* 13.9*  NEUTROABS 13.6* 11.3*  HGB 13.8 14.0  HCT 40.6 40.2  MCV 84.1 82.2  PLT 325 302   Cardiac Enzymes: No results for input(s): CKTOTAL, CKMB, CKMBINDEX, TROPONINI in the last 168  hours. BNP: Invalid input(s): POCBNP CBG: No results for input(s): GLUCAP in the last 168 hours. D-Dimer No results for input(s): DDIMER in the last 72 hours. Hgb A1c No results for input(s): HGBA1C in the last 72 hours. Lipid Profile No results for input(s): CHOL, HDL, LDLCALC, TRIG, CHOLHDL, LDLDIRECT in the last 72 hours. Thyroid  function studies No results for input(s): TSH, T4TOTAL, T3FREE, THYROIDAB in the last 72 hours.  Invalid input(s): FREET3 Anemia work up No results for input(s): VITAMINB12, FOLATE, FERRITIN, TIBC, IRON, RETICCTPCT in the last 72 hours. Urinalysis    Component Value Date/Time   COLORURINE YELLOW 07/05/2024 1426   APPEARANCEUR HAZY (A) 07/05/2024 1426   LABSPEC 1.008 07/05/2024 1426   PHURINE 5.0 07/05/2024 1426   GLUCOSEU NEGATIVE 07/05/2024 1426   HGBUR MODERATE (A) 07/05/2024 1426   BILIRUBINUR NEGATIVE 07/05/2024 1426   BILIRUBINUR negative 02/07/2023 1130   KETONESUR NEGATIVE 07/05/2024 1426   PROTEINUR 100 (A) 07/05/2024 1426   UROBILINOGEN 0.2 02/07/2023 1130   NITRITE NEGATIVE 07/05/2024 1426   LEUKOCYTESUR NEGATIVE 07/05/2024 1426   Sepsis Labs Recent Labs  Lab 07/04/24 1915 07/05/24 0454  WBC 15.0* 13.9*   Microbiology No results found for this or any previous visit (from the past 240 hours).   Time coordinating discharge: 35 minutes  SIGNED:   Lamisha Roussell, MD  [facility] 07/05/2024, 6:06 PM

## 2024-07-07 ENCOUNTER — Encounter: Payer: Self-pay | Admitting: Family Medicine

## 2024-07-07 LAB — LEVETIRACETAM LEVEL: Levetiracetam Lvl: 5 ug/mL — ABNORMAL LOW (ref 10.0–40.0)

## 2024-07-08 ENCOUNTER — Encounter: Payer: Self-pay | Admitting: Neurology

## 2024-07-08 NOTE — Progress Notes (Addendum)
 [facility] [address], [address] [place], [place] [address] [phone]  Date:  07/12/2024   Name:  Theresa [name]   DOB:  1956/06/21   MRN:  [id number]  PCP:  Watt Harlene BROCKS, MD    Chief Complaint: No chief complaint on file.   History of Present Illness:  Theresa [name] is a 68 y.o. very pleasant female patient who presents with the following:  Patient seen today for follow-up from recent hospitalization.  I saw her most recently in August History of bipolar disorder and epilepsy, tardive dyskinesia, dyslipidemia, essential hypertension  She unfortunately does tend to have regular seizures.  She was recently admitted overnight from November 12 to November 13 with 3 breakthrough seizures at home  There was some concern of missed doses of Keppra .  Her Keppra  level was low She has an appointment to see her neck neurologist, Dr. Georjean coming up  - Flu shot Discussed the use of AI scribe software for clinical note transcription with the patient, who gave verbal consent to proceed.  History of Present Illness    Patient Active Problem List   Diagnosis Date Noted   High anion gap metabolic acidosis 07/05/2024   Leukocytosis 07/05/2024   History of essential hypertension 07/05/2024   GERD (gastroesophageal reflux disease) 07/05/2024   Seizure (HCC) 07/04/2024   B12 deficiency 09/27/2023   Breakthrough seizure (HCC) 06/15/2022   Hypokalemia 06/15/2022   Hyponatremia 06/15/2022   Elevated AST (SGOT) 06/15/2022   Tongue laceration 06/15/2022   HLD (hyperlipidemia) 05/05/2020   Manic bipolar I disorder in partial remission (HCC) 07/25/2018   Posttraumatic stress disorder 07/25/2018   Generalized idiopathic epilepsy and epileptic syndromes, not intractable, without status epilepticus (HCC) 06/24/2017   Tardive dyskinesia 01/21/2017   Special screening for malignant neoplasms, colon 06/16/2012   History of colonic  polyps 06/16/2012   Bipolar disorder (HCC) 04/02/2012    Past Medical History:  Diagnosis Date   Adenomatous colon polyp    Allergy    Anxiety    Bipolar 1 disorder (HCC)    Blood transfusion without reported diagnosis    Cancer (HCC) years ago   a skin basil cell bump   Depression    Elevated LFTs    GERD (gastroesophageal reflux disease)    Hepatic steatosis 05/07/2013   Hyperlipidemia    Hypertension    Seizures (HCC) 1977   last seizure 05/2022   Tardive dyskinesia     Past Surgical History:  Procedure Laterality Date   APPENDECTOMY     COLONOSCOPY  06/16/2012   Procedure: COLONOSCOPY;  Surgeon: Princella CHRISTELLA Nida, MD;  Location: [facility];  Service: Endoscopy;  Laterality: N/A;   DILATION AND CURETTAGE OF UTERUS     EYE SURGERY     KNEE ARTHROSCOPY WITH MENISCAL REPAIR Left    NECK SURGERY     plate with screws to 3 cervical spines   SPINE SURGERY      Social History   Tobacco Use   Smoking status: Never   Smokeless tobacco: Never  Vaping Use   Vaping status: Never Used  Substance Use Topics   Alcohol use: Yes    Comment: Maybe several times per year   Drug use: Never    Family History  Problem Relation Age of Onset   Colon polyps Mother    Cancer Mother    Heart disease Mother    Alcoholism Mother  Hypertension Mother    Alcohol abuse Mother    Early death Mother    Colon polyps Father    Cancer Father    Alcoholism Father    Bipolar disorder Father    Alcohol abuse Father    Drug abuse Brother    Alcohol abuse Brother    Depression Brother    Bipolar disorder Other    Alcohol abuse Sister    Drug abuse Sister    Colon cancer Neg Hx    Stomach cancer Neg Hx    Breast cancer Neg Hx    Esophageal cancer Neg Hx    Rectal cancer Neg Hx     Allergies  Allergen Reactions   Flexeril  [Cyclobenzaprine ] Other (See Comments)    Mouth blisters/ulcers   Lamictal [Lamotrigine] Other (See Comments)    Acute renal failure   Lybalvi   [Olanzapine -Samidorphan] Nausea Only and Other (See Comments)    Dizziness    Ropinirole  Nausea And Vomiting   Ultram [Tramadol] Nausea And Vomiting   Codeine Swelling and Rash    OK to take hydrocodone /oxycodone with no reaction   Penicillins Other (See Comments)    Abdominal pain   Tylenol  [Acetaminophen ] Itching, Swelling and Rash    Medication list has been reviewed and updated.  Current Outpatient Medications on File Prior to Visit  Medication Sig Dispense Refill   ALPRAZolam  (XANAX  XR) 2 MG 24 hr tablet TAKE 1 TABLET BY MOUTH IN THE  MORNING AND AT BEDTIME 180 tablet 1   ALPRAZolam  (XANAX ) 1 MG tablet TAKE 1 TABLET BY MOUTH DAILY AS  NEEDED FOR SEVERE ANXIETY 30 tablet 5   amLODipine  (NORVASC ) 2.5 MG tablet Take 1 tablet (2.5 mg total) by mouth daily. Take with 5 mg to equal total 7.5 mg 30 tablet 1   amLODipine  (NORVASC ) 5 MG tablet TAKE 1 TABLET (5 MG TOTAL) BY MOUTH DAILY. 90 tablet 1   carbamazepine  (CARBATROL ) 300 MG 12 hr capsule Take 1 capsule (300 mg total) by mouth every morning AND 2 capsules (600 mg total) at bedtime. 270 capsule 1   cetirizine (ZYRTEC) 10 MG tablet Take 10 mg by mouth daily as needed for allergies.     Cyanocobalamin  (VITAMIN B-12 PO) Take 1 tablet by mouth daily.     Deutetrabenazine  ER (AUSTEDO  XR) 48 MG TB24 Take 48 mg by mouth daily. 90 tablet 3   famotidine  (PEPCID ) 40 MG tablet TAKE 1 TABLET BY MOUTH EVERYDAY AT BEDTIME 30 tablet 5   fluticasone  (FLONASE ) 50 MCG/ACT nasal spray PLACE 1 SPRAY INTO BOTH NOSTRILS DAILY AS NEEDED FOR ALLERGIES OR RHINITIS. 16 mL 5   ibuprofen  (ADVIL ) 200 MG tablet Take 400 mg by mouth 2 (two) times daily as needed for headache (pain).     levETIRAcetam  (KEPPRA ) 500 MG tablet Take 1 tablet in AM, 1 and 1/2 tablets in PM (Patient taking differently: Take 500-750 mg by mouth See admin instructions. Take 1 tablet (500mg ) by mouth every morning and take 1.5 tablets (750mg ) at bedtime.) 250 tablet 3    losartan -hydrochlorothiazide (HYZAAR) 100-25 MG tablet Take 1 tablet by mouth daily. 90 tablet 3   lovastatin  (MEVACOR ) 20 MG tablet TAKE 1 TABLET BY MOUTH EVERY DAY (Patient taking differently: Take 20 mg by mouth at bedtime.) 90 tablet 3   MELATONIN PO Take 2 tablets by mouth at bedtime.     metFORMIN  (GLUCOPHAGE ) 500 MG tablet TAKE 1 TABLET BY MOUTH TWICE A DAY WITH MEALS 180 tablet 0   OLANZapine  (  ZYPREXA ) 10 MG tablet Take 1 tablet (10 mg total) by mouth at bedtime. 90 tablet 1   pantoprazole  (PROTONIX ) 40 MG tablet Take 1 tablet (40 mg total) by mouth 2 (two) times daily. (Patient taking differently: Take 40 mg by mouth See admin instructions. Take 1 tablet (40mg ) by mouth with breakfast and take 1 tablet with supper as needed for heartburn, indigestion.) 180 tablet 1   traZODone  (DESYREL ) 100 MG tablet TAKE 1-2 TABLETS (100-200 MG TOTAL) BY MOUTH AT BEDTIME AS NEEDED. FOR SLEEP (Patient taking differently: Take 100 mg by mouth See admin instructions. Take 1 tablet (100mg ) by mouth at bedtime, may repeat dose once if unable to fall asleep.) 180 tablet 3   zolpidem  (AMBIEN ) 10 MG tablet Take 1 tablet (10 mg total) by mouth at bedtime as needed. (Patient taking differently: Take 10 mg by mouth at bedtime.) 90 tablet 1   No current facility-administered medications on file prior to visit.    Review of Systems:  As per HPI- otherwise negative.   Physical Examination: There were no vitals filed for this visit. There were no vitals filed for this visit. There is no height or weight on file to calculate BMI. Ideal Body Weight:    GEN: no acute distress. HEENT: Atraumatic, Normocephalic.  Ears and Nose: No external deformity. CV: RRR, No M/G/R. No JVD. No thrill. No extra heart sounds. PULM: CTA B, no wheezes, crackles, rhonchi. No retractions. No resp. distress. No accessory muscle use. ABD: S, NT, ND, +BS. No rebound. No HSM. EXTR: No c/c/e PSYCH: Normally interactive. Conversant.     Assessment and Plan: No diagnosis found.  Assessment & Plan   Signed Harlene Schroeder, MD

## 2024-07-09 ENCOUNTER — Ambulatory Visit
Admission: RE | Admit: 2024-07-09 | Discharge: 2024-07-09 | Disposition: A | Source: Ambulatory Visit | Attending: Family Medicine | Admitting: Family Medicine

## 2024-07-09 DIAGNOSIS — Z1231 Encounter for screening mammogram for malignant neoplasm of breast: Secondary | ICD-10-CM

## 2024-07-10 ENCOUNTER — Other Ambulatory Visit: Payer: Self-pay

## 2024-07-10 DIAGNOSIS — G40309 Generalized idiopathic epilepsy and epileptic syndromes, not intractable, without status epilepticus: Secondary | ICD-10-CM

## 2024-07-12 ENCOUNTER — Encounter: Payer: Self-pay | Admitting: Family Medicine

## 2024-07-12 ENCOUNTER — Ambulatory Visit (INDEPENDENT_AMBULATORY_CARE_PROVIDER_SITE_OTHER): Admitting: Family Medicine

## 2024-07-12 VITALS — BP 140/90 | HR 89 | Ht 66.0 in | Wt 178.8 lb

## 2024-07-12 DIAGNOSIS — G40919 Epilepsy, unspecified, intractable, without status epilepticus: Secondary | ICD-10-CM | POA: Diagnosis not present

## 2024-07-12 DIAGNOSIS — I10 Essential (primary) hypertension: Secondary | ICD-10-CM | POA: Diagnosis not present

## 2024-07-12 DIAGNOSIS — Z131 Encounter for screening for diabetes mellitus: Secondary | ICD-10-CM | POA: Diagnosis not present

## 2024-07-12 LAB — COMPREHENSIVE METABOLIC PANEL WITH GFR
ALT: 42 U/L — ABNORMAL HIGH (ref 0–35)
AST: 28 U/L (ref 0–37)
Albumin: 4.7 g/dL (ref 3.5–5.2)
Alkaline Phosphatase: 89 U/L (ref 39–117)
BUN: 18 mg/dL (ref 6–23)
CO2: 30 meq/L (ref 19–32)
Calcium: 9.4 mg/dL (ref 8.4–10.5)
Chloride: 99 meq/L (ref 96–112)
Creatinine, Ser: 0.98 mg/dL (ref 0.40–1.20)
GFR: 59.4 mL/min — ABNORMAL LOW (ref >60.00)
Glucose, Bld: 114 mg/dL — ABNORMAL HIGH (ref 70–99)
Potassium: 3.7 meq/L (ref 3.5–5.1)
Sodium: 140 meq/L (ref 135–145)
Total Bilirubin: 0.5 mg/dL (ref 0.2–1.2)
Total Protein: 7.2 g/dL (ref 6.0–8.3)

## 2024-07-12 LAB — CBC
HCT: 40.1 % (ref 36.0–46.0)
Hemoglobin: 13.3 g/dL (ref 12.0–15.0)
MCHC: 33.3 g/dL (ref 30.0–36.0)
MCV: 86.4 fl (ref 78.0–100.0)
Platelets: 318 K/uL (ref 150.0–400.0)
RBC: 4.64 Mil/uL (ref 3.87–5.11)
RDW: 14 % (ref 11.5–15.5)
WBC: 5.8 K/uL (ref 4.0–10.5)

## 2024-07-12 LAB — MAGNESIUM: Magnesium: 1.6 mg/dL (ref 1.5–2.5)

## 2024-07-12 LAB — HEMOGLOBIN A1C: Hgb A1c MFr Bld: 5.7 % (ref 4.6–6.5)

## 2024-07-12 MED ORDER — AMLODIPINE BESYLATE 10 MG PO TABS: 10.0000 mg | ORAL_TABLET | Freq: Every day | ORAL | 3 refills | Status: AC

## 2024-07-12 NOTE — Patient Instructions (Signed)
 I will be in touch with labs asap Increase amlodipine  to 10 mg daily total If this does not get your BP under 140/80 I will add another agent- please keep me posted

## 2024-07-17 ENCOUNTER — Ambulatory Visit: Payer: Self-pay | Admitting: Family Medicine

## 2024-07-17 LAB — LEVETIRACETAM LEVEL

## 2024-07-18 ENCOUNTER — Other Ambulatory Visit

## 2024-07-18 ENCOUNTER — Other Ambulatory Visit: Payer: Self-pay | Admitting: Family Medicine

## 2024-07-18 DIAGNOSIS — G40919 Epilepsy, unspecified, intractable, without status epilepticus: Secondary | ICD-10-CM

## 2024-07-18 NOTE — Progress Notes (Signed)
.  ke

## 2024-07-24 ENCOUNTER — Ambulatory Visit: Payer: Self-pay | Admitting: Family Medicine

## 2024-07-24 LAB — LEVETIRACETAM LEVEL: Keppra (Levetiracetam): 13.9 ug/mL

## 2024-07-26 ENCOUNTER — Ambulatory Visit: Admitting: Psychiatry

## 2024-08-01 ENCOUNTER — Ambulatory Visit: Admitting: Neurology

## 2024-08-01 ENCOUNTER — Encounter: Payer: Self-pay | Admitting: Neurology

## 2024-08-01 VITALS — BP 158/97 | HR 92 | Ht 66.0 in | Wt 186.2 lb

## 2024-08-01 DIAGNOSIS — R413 Other amnesia: Secondary | ICD-10-CM

## 2024-08-01 DIAGNOSIS — F319 Bipolar disorder, unspecified: Secondary | ICD-10-CM | POA: Diagnosis not present

## 2024-08-01 DIAGNOSIS — G40309 Generalized idiopathic epilepsy and epileptic syndromes, not intractable, without status epilepticus: Secondary | ICD-10-CM | POA: Diagnosis not present

## 2024-08-01 MED ORDER — VALTOCO 20 MG DOSE 2 X 10 MG/0.1ML NA LQPK: NASAL | 5 refills | Status: AC

## 2024-08-01 MED ORDER — LEVETIRACETAM 500 MG PO TABS: ORAL_TABLET | ORAL | 3 refills | Status: DC
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
## 2024-08-01 NOTE — Patient Instructions (Signed)
 Good to see you, I hope you feel better soon.  Schedule 1-hour EEG  2. Increase Levetiracetam  500mg : take 1 and 1/2 tablets twice a day  3. Continue Carbatrol  as prescribed  4. A prescription for as needed Valtoco was sent to the pharmacy to give for seizure rescue  5. Proceed with Neurocognitive testing as scheduled  6. Follow-up on March 25 at 1:20pm, call for any changes   Seizure Precautions: 1. If medication has been prescribed for you to prevent seizures, take it exactly as directed.  Do not stop taking the medicine without talking to your doctor first, even if you have not had a seizure in a long time.   2. Avoid activities in which a seizure would cause danger to yourself or to others.  Don't operate dangerous machinery, swim alone, or climb in high or dangerous places, such as on ladders, roofs, or girders.  Do not drive unless your doctor says you may.  3. If you have any warning that you may have a seizure, lay down in a safe place where you can't hurt yourself.    4.  No driving for 6 months from last seizure, as per [place]  state law.   Please refer to the following link on the Epilepsy Foundation of America's website for more information: http://www.epilepsyfoundation.org/answerplace/Social/driving/drivingu.cfm   5.  Maintain good sleep hygiene.  6.  Contact your doctor if you have any problems that may be related to the medicine you are taking.  7.  Call 911 and bring the patient back to the ED if:        A.  The seizure lasts longer than 5 minutes.       B.  The patient doesn't awaken shortly after the seizure  C.  The patient has new problems such as difficulty seeing, speaking or moving  D.  The patient was injured during the seizure  E.  The patient has a temperature over 102 F (39C)  F.  The patient vomited and now is having trouble breathing

## 2024-08-05 ENCOUNTER — Encounter: Payer: Self-pay | Admitting: Neurology

## 2024-08-06 ENCOUNTER — Other Ambulatory Visit: Payer: Self-pay | Admitting: Nurse Practitioner

## 2024-08-06 DIAGNOSIS — N9489 Other specified conditions associated with female genital organs and menstrual cycle: Secondary | ICD-10-CM

## 2024-08-07 ENCOUNTER — Ambulatory Visit

## 2024-08-07 DIAGNOSIS — G40309 Generalized idiopathic epilepsy and epileptic syndromes, not intractable, without status epilepticus: Secondary | ICD-10-CM

## 2024-08-07 DIAGNOSIS — F319 Bipolar disorder, unspecified: Secondary | ICD-10-CM

## 2024-08-07 DIAGNOSIS — R413 Other amnesia: Secondary | ICD-10-CM

## 2024-08-07 NOTE — Progress Notes (Unsigned)
 EEG complete and ready for review.

## 2024-08-08 NOTE — Procedures (Signed)
 ELECTROENCEPHALOGRAM REPORT  Date of Study: 08/07/2024  Patient's Name: Theresa [name] MRN: [id number] Date of Birth: [birth year]  Referring Provider: Dr. Darice Shivers  Clinical History: This is a 68 year old woman with a history of epilepsy with an increase in seizures and memory loss. EEG to assess for subclinical seizures.   CNS Active Medications: Keppra , Carbatrol , Austedo , Zyprexa , Xanax , Trazodone   Technical Summary: A multichannel digital 1-hour EEG recording measured by the international 10-20 system with electrodes applied with paste and impedances below 5000 ohms performed as portable with EKG monitoring in an awake and asleep patient.  Hyperventilation was not performed. Photic stimulation was performed.  The digital EEG was referentially recorded, reformatted, and digitally filtered in a variety of bipolar and referential montages for optimal display.   Description: The patient is awake and asleep during the recording.  During maximal wakefulness, there is a symmetric, medium voltage 7 Hz posterior dominant rhythm that attenuates with eye opening. The record is symmetric.  During drowsiness and sleep, there is an increase in theta slowing of the background.  Vertex waves and symmetric sleep spindles were seen.  Photic stimulation did not elicit any abnormalities.  There were occasional right temporal spike and sharp waves seen in sleep. No electrographic seizures noted.  EKG lead was unremarkable.  Impression: This 1-hour awake and asleep EEG is abnormal due to the presence of: Slowing of the posterior dominant rhythm Occasional right temporal epileptiform discharges  Clinical Correlation of the above findings indicates diffuse cerebral dysfunction that is non-specific in etiology and can be seen with hypoxic/ischemic injury, toxic/metabolic encephalopathies, neurodegenerative disorders, medication effect, or excessive drowsiness. There is a tendency for seizures to arise from  the right temporal region. No subclinical seizures seen. Clinical correlation is advised.   Darice Shivers, M.D.

## 2024-08-09 ENCOUNTER — Ambulatory Visit: Payer: Self-pay | Admitting: Psychiatry

## 2024-08-09 DIAGNOSIS — F4310 Post-traumatic stress disorder, unspecified: Secondary | ICD-10-CM

## 2024-08-09 NOTE — Progress Notes (Signed)
°      [facility] Counselor/Therapist Progress Note  Patient ID: AYANNA GHEEN, MRN: [id number],    Date: 08/09/2024  Time Spent: 38 minutes start time 3:03 PM end 3:41 PM  Treatment Type: Individual Therapy  Reported Symptoms: memory issues, health issues, seizures, sadness  Mental Status Exam:  Appearance:   Well Groomed     Behavior:  Appropriate  Motor:  Normal  Speech/Language:   Normal Rate  Affect:  Appropriate  Mood:  anxious  Thought process:  circumstantial  Thought content:    WNL  Sensory/Perceptual disturbances:    WNL  Orientation:  oriented to person, place, time/date, and situation  Attention:  Good  Concentration:  Good  Memory:  Immediate;   Poor  Fund of knowledge:   Fair  Insight:    Good  Judgment:   Good  Impulse Control:  Good   Risk Assessment: Danger to Self:  No Self-injurious Behavior: No Danger to Others: No Duty to Warn:no Physical Aggression / Violence:No  Access to Firearms a concern: No  Gang Involvement:No   Subjective: Patient was present for session. She shared she was in the hospital due to having seizures. She went on to share that her memory has been very bad since that time. She explained she is not sure what to do concerning her memory because she does not have a whole lot of recall on anything that is happened in treatment.  She was able to find her journal and was able to see some of the only DBT skills that have been addressed in session.  Reviewed them again with  patient and went through DBT skill ACC EPTS and wrote out things for patient.  Reviewed other grounding skills that she can utilize as she starts feeling agitated.  Agreed at this point she can leave the appointments on the books but if she feels that she cannot process it would be fine to cancel the session.  She shared she is going to be working with a web designer for testing to see what else is going on with her memory but unfortunately that appointment is not  until March.  Discussed making sure to contact their office to see if there has been any cancellations.    Interventions: Dialectical Behavioral Therapy and Solution-Oriented/Positive Psychology  Diagnosis:   ICD-10-CM   1. Posttraumatic stress disorder  F43.10       Plan: Patient is to continue going for walks to release negative emotions appropriately. Patient is to practice safe place exercise from session to help decrease negative emotions. She is to journal through writing and art, practice coping skills and DBT skills to work on managing/decreasing triggered responses. She is to take medication as directed and continue meeting with her providers.   Silvano Pacini, [facility]

## 2024-08-14 ENCOUNTER — Ambulatory Visit
Admission: RE | Admit: 2024-08-14 | Discharge: 2024-08-14 | Disposition: A | Discharge status: Home / Self Care | Source: Ambulatory Visit | Attending: Nurse Practitioner | Admitting: Nurse Practitioner

## 2024-08-14 DIAGNOSIS — N9489 Other specified conditions associated with female genital organs and menstrual cycle: Secondary | ICD-10-CM

## 2024-08-18 ENCOUNTER — Other Ambulatory Visit: Payer: Self-pay | Admitting: Family Medicine

## 2024-08-18 DIAGNOSIS — K219 Gastro-esophageal reflux disease without esophagitis: Secondary | ICD-10-CM

## 2024-08-26 NOTE — Progress Notes (Deleted)
 [facility] [address], [address] [place], [place] [address] [phone]  Date:  08/29/2024   Name:  Theresa [name]   DOB:  29-Mar-1956   MRN:  [id number]  PCP:  Watt Harlene BROCKS, MD    Chief Complaint: No chief complaint on file.   History of Present Illness:  Theresa [name] is a 69 y.o. very pleasant female patient who presents with the following:  Patient seen today with concern of excessively frequent urination.  Most recent visit with myself was in November for routine follow-up History of bipolar disorder and epilepsy, tardive dyskinesia, dyslipidemia, essential hypertension  She unfortunately does tend to have regular seizures.  She was recently admitted overnight from November 12 to November 13 with 3 breakthrough seizures at home  She followed up with neurology on 12/10 Immunizations are up to date We did blood work in November at which time her sodium was normal, minimal bump in ALT to 42 and BUN/creatinine looking okay  Discussed the use of AI scribe software for clinical note transcription with the patient, who gave verbal consent to proceed.  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 (HCC) 07/25/2018   Posttraumatic stress disorder 07/25/2018   Generalized idiopathic epilepsy and epileptic syndromes, not intractable, without status epilepticus (HCC) 06/24/2017   Tardive dyskinesia 01/21/2017   Special  screening for malignant neoplasms, colon 06/16/2012   History of colonic polyps 06/16/2012   Bipolar disorder (HCC) 04/02/2012    Past Medical History:  Diagnosis Date   Adenomatous colon polyp    Allergy    Anxiety    Bipolar 1 disorder (HCC)    Blood transfusion without reported diagnosis    Cancer (HCC) years ago   a skin basil cell bump   Depression    Elevated LFTs    GERD (gastroesophageal reflux disease)    Hepatic steatosis 05/07/2013   Hyperlipidemia    Hypertension    Seizures (HCC) 1977   last seizure 05/2022   Tardive dyskinesia     Past Surgical History:  Procedure Laterality Date   APPENDECTOMY     COLONOSCOPY  06/16/2012   Procedure: COLONOSCOPY;  Surgeon: Princella CHRISTELLA Nida, MD;  Location: [facility];  Service: Endoscopy;  Laterality: N/A;   DILATION AND CURETTAGE OF UTERUS     EYE SURGERY     KNEE ARTHROSCOPY WITH MENISCAL REPAIR Left    NECK SURGERY     plate with screws to 3 cervical spines   SPINE SURGERY      Social History[1]  Family History  Problem Relation Age of Onset   Colon polyps Mother    Cancer Mother    Heart disease Mother    Alcoholism Mother    Hypertension Mother    Alcohol abuse Mother    Early death Mother    Colon polyps Father    Cancer Father    Alcoholism Father    Bipolar disorder Father  Alcohol abuse Father    Drug abuse Brother    Alcohol abuse Brother    Depression Brother    Bipolar disorder Other    Alcohol abuse Sister    Drug abuse Sister    Colon cancer Neg Hx    Stomach cancer Neg Hx    Breast cancer Neg Hx    Esophageal cancer Neg Hx    Rectal cancer Neg Hx     Allergies[2]  Medication list has been reviewed and updated.  Medications Ordered Prior to Encounter[3]  Review of Systems:  As per HPI- otherwise negative.   Physical Examination: There were no vitals filed for this visit. There were no vitals filed for this visit. There is no height or weight on file to calculate BMI. Ideal  Body Weight:    GEN: no acute distress. HEENT: Atraumatic, Normocephalic.  Ears and Nose: No external deformity. CV: RRR, No M/G/R. No JVD. No thrill. No extra heart sounds. PULM: CTA B, no wheezes, crackles, rhonchi. No retractions. No resp. distress. No accessory muscle use. ABD: S, NT, ND, +BS. No rebound. No HSM. EXTR: No c/c/e PSYCH: Normally interactive. Conversant.    Assessment and Plan: No diagnosis found.  Assessment & Plan   Signed Harlene Schroeder, MD    [1]  Social History Tobacco Use   Smoking status: Never   Smokeless tobacco: Never  Vaping Use   Vaping status: Never Used  Substance Use Topics   Alcohol use: Yes    Comment: Maybe several times per year   Drug use: Never  [2]  Allergies Allergen Reactions   Flexeril  [Cyclobenzaprine ] Other (See Comments)    Mouth blisters/ulcers   Lamictal [Lamotrigine] Other (See Comments)    Acute renal failure   Lybalvi  [Olanzapine -Samidorphan] Nausea Only and Other (See Comments)    Dizziness    Ropinirole  Nausea And Vomiting   Ultram [Tramadol] Nausea And Vomiting   Codeine Swelling and Rash    OK to take hydrocodone /oxycodone with no reaction   Penicillins Other (See Comments)    Abdominal pain   Tylenol  [Acetaminophen ] Itching, Swelling and Rash  [3]  Current Outpatient Medications on File Prior to Visit  Medication Sig Dispense Refill   ALPRAZolam  (XANAX  XR) 2 MG 24 hr tablet TAKE 1 TABLET BY MOUTH IN THE  MORNING AND AT BEDTIME 180 tablet 1   ALPRAZolam  (XANAX ) 1 MG tablet TAKE 1 TABLET BY MOUTH DAILY AS  NEEDED FOR SEVERE ANXIETY 30 tablet 5   amLODipine  (NORVASC ) 10 MG tablet Take 1 tablet (10 mg total) by mouth daily. 30 tablet 3   carbamazepine  (CARBATROL ) 300 MG 12 hr capsule Take 1 capsule (300 mg total) by mouth every morning AND 2 capsules (600 mg total) at bedtime. 270 capsule 1   cetirizine (ZYRTEC) 10 MG tablet Take 10 mg by mouth daily as needed for allergies.     Cyanocobalamin  (VITAMIN B-12  PO) Take 1 tablet by mouth daily.     Deutetrabenazine  ER (AUSTEDO  XR) 48 MG TB24 Take 48 mg by mouth daily. 90 tablet 3   diazePAM , 20 MG Dose, (VALTOCO  20 MG DOSE) 2 x 10 MG/0.1ML LQPK Administer one spray in one nostril, second spray in other nostril (one dose) as needed for seizure. May give second dose after 4 hours if needed. 10 each 5   famotidine  (PEPCID ) 40 MG tablet TAKE 1 TABLET BY MOUTH EVERYDAY AT BEDTIME 30 tablet 5   fluticasone  (FLONASE ) 50 MCG/ACT nasal spray PLACE 1 SPRAY INTO  BOTH NOSTRILS DAILY AS NEEDED FOR ALLERGIES OR RHINITIS. 16 mL 5   ibuprofen  (ADVIL ) 200 MG tablet Take 400 mg by mouth 2 (two) times daily as needed for headache (pain).     levETIRAcetam  (KEPPRA ) 500 MG tablet Take 1 and 1/2 tablets in AM, 1 and 1/2 tablets in PM 300 tablet 3   losartan  (COZAAR ) 100 MG tablet Take 100 mg by mouth daily.     losartan -hydrochlorothiazide (HYZAAR) 100-25 MG tablet Take 1 tablet by mouth daily. 90 tablet 3   lovastatin  (MEVACOR ) 20 MG tablet TAKE 1 TABLET BY MOUTH EVERY DAY 90 tablet 3   MELATONIN PO Take 2 tablets by mouth at bedtime.     metFORMIN  (GLUCOPHAGE ) 500 MG tablet TAKE 1 TABLET BY MOUTH TWICE A DAY WITH MEALS 180 tablet 0   OLANZapine  (ZYPREXA ) 10 MG tablet Take 1 tablet (10 mg total) by mouth at bedtime. 90 tablet 1   pantoprazole  (PROTONIX ) 40 MG tablet TAKE 1 TABLET BY MOUTH EVERY DAY 90 tablet 3   traZODone  (DESYREL ) 100 MG tablet TAKE 1-2 TABLETS (100-200 MG TOTAL) BY MOUTH AT BEDTIME AS NEEDED. FOR SLEEP (Patient taking differently: Take 100 mg by mouth See admin instructions. Take 1 tablet (100mg ) by mouth at bedtime, may repeat dose once if unable to fall asleep.) 180 tablet 3   zolpidem  (AMBIEN ) 10 MG tablet Take 1 tablet (10 mg total) by mouth at bedtime as needed. (Patient taking differently: Take 10 mg by mouth at bedtime.) 90 tablet 1   No current facility-administered medications on file prior to visit.   "

## 2024-08-29 ENCOUNTER — Ambulatory Visit: Admitting: Family Medicine

## 2024-08-30 ENCOUNTER — Ambulatory Visit (INDEPENDENT_AMBULATORY_CARE_PROVIDER_SITE_OTHER)

## 2024-08-30 ENCOUNTER — Ambulatory Visit: Admitting: Psychiatry

## 2024-09-08 ENCOUNTER — Encounter: Payer: Self-pay | Admitting: Family Medicine

## 2024-09-09 MED ORDER — LOSARTAN POTASSIUM 100 MG PO TABS: 100.0000 mg | ORAL_TABLET | Freq: Every day | ORAL | 3 refills | Status: AC

## 2024-09-09 NOTE — Addendum Note (Signed)
 Addended by: WATT RAISIN C on: 09/09/2024 08:51 AM   Modules accepted: Orders

## 2024-09-11 NOTE — Progress Notes (Unsigned)
 [facility] [address], [address] [place], [place] [address] [phone]  Date:  09/13/2024   Name:  Theresa [name]   DOB:  07/12/1956   MRN:  [id number]  PCP:  Watt Harlene BROCKS, MD    Chief Complaint: Urinary Frequency (The bump is better, it took about 9 days. It was from a fall I had/Frequent urination onset one year )   History of Present Illness:  Theresa [name] is a 69 y.o. very pleasant female patient who presents with the following:  Patient is in today with concern of urinary frequency and not feeling well.  Accompanied today by her husband I saw her most recently in November for follow-up after she had recently been admitted with a breakthrough seizure History of bipolar disorder and epilepsy, tardive dyskinesia, dyslipidemia, essential hypertension  She unfortunately does tend to have regular seizures.   She was also having some difficulty with her blood pressure management at her last visit We had her continue her dose of losartan  and increased her amlodipine  Sodium looked okay at labs in November  Discussed the use of AI scribe software for clinical note transcription with the patient, who gave verbal consent to proceed.  History of Present Illness Theresa [name] is a 69 year old female with a history of seizures who presents with frequent urination and recent falls.  She has been experiencing frequent urination for at least a year, with episodes occurring every 15 minutes for an hour to in the morning and slowing to every 1-1.5 hours in the afternoon. She initially attributed this to aging and attempted to reduce coffee intake, which provided minimal relief. An ultrasound revealed a 3 cm calcified fibroid, but no other significant findings were reported. She typically gets up about three times at night to urinate, which is less frequent than during the day.  She has experienced several falls  recently, occurring within a span of 48 hours. The first fall happened about ten days ago when she was rushing to the bathroom and fell on the stairs, possibly hitting her head. She was not sure if she lost consciousness but was responsive shortly after. The second fall occurred the next day when she tripped over her dog, resulting in a head bump. The third fall happened later that day while rushing to the bathroom again. She attributes some of her falls to her tardive dyskinesia and the sedative effects of her medications, which make her feel off-balance, especially at night. She reports that she has not experienced severe headaches or vomiting since her falls, and she feels that the bump on her head is getting better.  She has a history of seizures, with the most recent cluster occurring in November, involving three seizures.  Patient notes that after she has a cluster of seizures like this she experiences prolonged recovery periods, feeling generally unwell for 6-8 months. She has been taking Advil  for headaches and neck pain, which she believes is aggravated by her seizures and a past neck surgery. She wears a neck brace for support.  Her blood pressure has been slightly elevated, and she is currently on amlodipine  10 mg and losartan  100 mg. She has been monitoring her blood pressure at home, noting it remains slightly high similar to the numbers we got today.   Patient Active Problem List   Diagnosis Date Noted   High anion gap metabolic acidosis 07/05/2024   Leukocytosis 07/05/2024  History of essential hypertension 07/05/2024   GERD (gastroesophageal reflux disease) 07/05/2024   Seizure (HCC) 07/04/2024   B12 deficiency 09/27/2023   Breakthrough seizure (HCC) 06/15/2022   Hypokalemia 06/15/2022   Hyponatremia 06/15/2022   Elevated AST (SGOT) 06/15/2022   Tongue laceration 06/15/2022   HLD (hyperlipidemia) 05/05/2020   Manic bipolar I disorder in partial remission (HCC) 07/25/2018    Posttraumatic stress disorder 07/25/2018   Generalized idiopathic epilepsy and epileptic syndromes, not intractable, without status epilepticus (HCC) 06/24/2017   Tardive dyskinesia 01/21/2017   Special screening for malignant neoplasms, colon 06/16/2012   History of colonic polyps 06/16/2012   Bipolar disorder (HCC) 04/02/2012    Past Medical History:  Diagnosis Date   Adenomatous colon polyp    Allergy    Anxiety    Bipolar 1 disorder (HCC)    Blood transfusion without reported diagnosis    Cancer (HCC) years ago   a skin basil cell bump   Depression    Elevated LFTs    GERD (gastroesophageal reflux disease)    Hepatic steatosis 05/07/2013   Hyperlipidemia    Hypertension    Seizures (HCC) 1977   last seizure 05/2022   Tardive dyskinesia     Past Surgical History:  Procedure Laterality Date   APPENDECTOMY     COLONOSCOPY  06/16/2012   Procedure: COLONOSCOPY;  Surgeon: Princella CHRISTELLA Nida, MD;  Location: [facility];  Service: Endoscopy;  Laterality: N/A;   DILATION AND CURETTAGE OF UTERUS     EYE SURGERY     KNEE ARTHROSCOPY WITH MENISCAL REPAIR Left    NECK SURGERY     plate with screws to 3 cervical spines   SPINE SURGERY      Social History[1]  Family History  Problem Relation Age of Onset   Colon polyps Mother    Cancer Mother    Heart disease Mother    Alcoholism Mother    Hypertension Mother    Alcohol abuse Mother    Early death Mother    Colon polyps Father    Cancer Father    Alcoholism Father    Bipolar disorder Father    Alcohol abuse Father    Drug abuse Brother    Alcohol abuse Brother    Depression Brother    Bipolar disorder Other    Alcohol abuse Sister    Drug abuse Sister    Colon cancer Neg Hx    Stomach cancer Neg Hx    Breast cancer Neg Hx    Esophageal cancer Neg Hx    Rectal cancer Neg Hx     Allergies[2]  Medication list has been reviewed and updated.  Medications Ordered Prior to Encounter[3]  Review of Systems:  As  per HPI- otherwise negative. BP Readings from Last 3 Encounters:  09/13/24 (!) 142/96  08/01/24 (!) 158/97  07/12/24 (!) 140/90   140/100   Physical Examination: Vitals:   09/13/24 1528  BP: (!) 142/96  Pulse: (!) 103  SpO2: 98%   Vitals:   09/13/24 1528  Weight: 183 lb (83 kg)  Height: 5' 6 (1.676 m)   Body mass index is 29.54 kg/m. Ideal Body Weight: Weight in (lb) to have BMI = 25: 154.6  GEN: no acute distress.  Appears her normal self, overweight-accompanied by her husband who assists with the history today HEENT: Atraumatic, Normocephalic. Bilateral TM wnl, oropharynx normal.  PEERL,EOMI. no evidence of any significant external head trauma Ears and Nose: No external deformity. CV: RRR, No M/G/R.  No JVD. No thrill. No extra heart sounds. PULM: CTA B, no wheezes, crackles, rhonchi. No retractions. No resp. distress. No accessory muscle use. ABD: S, NT, ND, +BS. No rebound. No HSM. EXTR: No c/c/e PSYCH: Normally interactive. Conversant.    Assessment and Plan: Essential hypertension - Plan: Basic metabolic panel with GFR, CBC, metoprolol  succinate (TOPROL -XL) 25 MG 24 hr tablet  Overactive bladder - Plan: mirabegron  ER (MYRBETRIQ ) 25 MG TB24 tablet, POCT urinalysis dipstick, Urine Culture  Breakthrough seizure (HCC)  Assessment & Plan Overactive bladder Chronic urgency and frequency, with morning exacerbation and nocturia.  UA appears benign, doubt UTI.  Prefer Myrbetriq  for treatment due to patient's multiple health issues and other medications insurance coverage uncertain. - Ordered urine culture to rule out urinary tract infection. - Prescribed myrbetriq  for bladder calming.  Essential hypertension Slightly elevated blood pressure, consistent with home readings. Managed with amlodipine  and losartan . Discussed NSAIDs' impact on blood pressure. - Continue amlodipine  10 mg and losartan  100 mg.  Will add metoprolol  25 mg to her daily regimen - Consider using  Tylenol  instead of Aleve to manage neck pain and reduce potential impact on blood pressure.  Seizure disorder Recent seizures with post-seizure malaise and memory issues. Recent falls possibly related to medication side effects and post-seizure status.  She has neurology care  Recent falls with minor head injury Multiple falls with minor head injury about a week ago. Falls possibly related to medication side effects and urgency to urinate. Monitoring symptoms.  Offered to do a CT, for now the patient declines - Monitor for worsening symptoms such as vomiting or severe headache. - Plan for CT scan if symptoms worsen.  Cervical post-surgical pain Cervical pain possibly aggravated by falls. Managed with neck brace and NSAIDs. Discussed NSAIDs' impact on blood pressure. - Continue wearing neck brace. - Consider using Tylenol  instead of Aleve to manage pain and reduce potential impact on blood pressure.  Signed Harlene Schroeder, MD     [1]  Social History Tobacco Use   Smoking status: Never   Smokeless tobacco: Never  Vaping Use   Vaping status: Never Used  Substance Use Topics   Alcohol use: Yes    Comment: Maybe several times per year   Drug use: Never  [2]  Allergies Allergen Reactions   Flexeril  [Cyclobenzaprine ] Other (See Comments)    Mouth blisters/ulcers   Lamictal [Lamotrigine] Other (See Comments)    Acute renal failure   Lybalvi  [Olanzapine -Samidorphan] Nausea Only and Other (See Comments)    Dizziness    Ropinirole  Nausea And Vomiting   Ultram [Tramadol] Nausea And Vomiting   Codeine Swelling and Rash    OK to take hydrocodone /oxycodone with no reaction   Penicillins Other (See Comments)    Abdominal pain   Tylenol  [Acetaminophen ] Itching, Swelling and Rash  [3]  Current Outpatient Medications on File Prior to Visit  Medication Sig Dispense Refill   ALPRAZolam  (XANAX  XR) 2 MG 24 hr tablet TAKE 1 TABLET BY MOUTH IN THE  MORNING AND AT BEDTIME 180 tablet 1    ALPRAZolam  (XANAX ) 1 MG tablet TAKE 1 TABLET BY MOUTH DAILY AS  NEEDED FOR SEVERE ANXIETY 30 tablet 5   amLODipine  (NORVASC ) 10 MG tablet Take 1 tablet (10 mg total) by mouth daily. 30 tablet 3   carbamazepine  (CARBATROL ) 300 MG 12 hr capsule Take 1 capsule (300 mg total) by mouth every morning AND 2 capsules (600 mg total) at bedtime. 270 capsule 1   cetirizine (ZYRTEC) 10 MG  tablet Take 10 mg by mouth daily as needed for allergies.     Cyanocobalamin  (VITAMIN B-12 PO) Take 1 tablet by mouth daily.     Deutetrabenazine  ER (AUSTEDO  XR) 48 MG TB24 Take 48 mg by mouth daily. 90 tablet 3   diazePAM , 20 MG Dose, (VALTOCO  20 MG DOSE) 2 x 10 MG/0.1ML LQPK Administer one spray in one nostril, second spray in other nostril (one dose) as needed for seizure. May give second dose after 4 hours if needed. 10 each 5   famotidine  (PEPCID ) 40 MG tablet TAKE 1 TABLET BY MOUTH EVERYDAY AT BEDTIME 30 tablet 5   fluticasone  (FLONASE ) 50 MCG/ACT nasal spray PLACE 1 SPRAY INTO BOTH NOSTRILS DAILY AS NEEDED FOR ALLERGIES OR RHINITIS. 16 mL 5   ibuprofen  (ADVIL ) 200 MG tablet Take 400 mg by mouth 2 (two) times daily as needed for headache (pain).     levETIRAcetam  (KEPPRA ) 500 MG tablet Take 1 and 1/2 tablets in AM, 1 and 1/2 tablets in PM 300 tablet 3   losartan  (COZAAR ) 100 MG tablet Take 1 tablet (100 mg total) by mouth daily. 90 tablet 3   lovastatin  (MEVACOR ) 20 MG tablet TAKE 1 TABLET BY MOUTH EVERY DAY 90 tablet 3   MELATONIN PO Take 2 tablets by mouth at bedtime.     metFORMIN  (GLUCOPHAGE ) 500 MG tablet TAKE 1 TABLET BY MOUTH TWICE A DAY WITH MEALS 180 tablet 0   OLANZapine  (ZYPREXA ) 10 MG tablet Take 1 tablet (10 mg total) by mouth at bedtime. 90 tablet 1   pantoprazole  (PROTONIX ) 40 MG tablet TAKE 1 TABLET BY MOUTH EVERY DAY 90 tablet 3   traZODone  (DESYREL ) 100 MG tablet TAKE 1-2 TABLETS (100-200 MG TOTAL) BY MOUTH AT BEDTIME AS NEEDED. FOR SLEEP (Patient taking differently: Take 100 mg by mouth See admin  instructions. Take 1 tablet (100mg ) by mouth at bedtime, may repeat dose once if unable to fall asleep.) 180 tablet 3   zolpidem  (AMBIEN ) 10 MG tablet Take 1 tablet (10 mg total) by mouth at bedtime as needed. (Patient taking differently: Take 10 mg by mouth at bedtime.) 90 tablet 1   No current facility-administered medications on file prior to visit.   "

## 2024-09-13 ENCOUNTER — Encounter: Payer: Self-pay | Admitting: Family Medicine

## 2024-09-13 ENCOUNTER — Encounter: Payer: Self-pay | Admitting: Psychiatry

## 2024-09-13 ENCOUNTER — Ambulatory Visit: Admitting: Family Medicine

## 2024-09-13 ENCOUNTER — Ambulatory Visit: Admitting: Psychiatry

## 2024-09-13 VITALS — BP 142/96 | HR 103 | Ht 66.0 in | Wt 183.0 lb

## 2024-09-13 DIAGNOSIS — F4310 Post-traumatic stress disorder, unspecified: Secondary | ICD-10-CM | POA: Diagnosis not present

## 2024-09-13 DIAGNOSIS — N3281 Overactive bladder: Secondary | ICD-10-CM

## 2024-09-13 DIAGNOSIS — G40919 Epilepsy, unspecified, intractable, without status epilepticus: Secondary | ICD-10-CM

## 2024-09-13 DIAGNOSIS — I10 Essential (primary) hypertension: Secondary | ICD-10-CM | POA: Diagnosis not present

## 2024-09-13 LAB — POCT URINALYSIS DIP (MANUAL ENTRY)
Bilirubin, UA: NEGATIVE
Blood, UA: NEGATIVE
Glucose, UA: NEGATIVE mg/dL
Ketones, POC UA: NEGATIVE mg/dL
Nitrite, UA: NEGATIVE
Protein Ur, POC: NEGATIVE mg/dL
Spec Grav, UA: <=1.005 — AB
Urobilinogen, UA: 0.2 U/dL
pH, UA: 7

## 2024-09-13 MED ORDER — MIRABEGRON ER 25 MG PO TB24: 25.0000 mg | ORAL_TABLET | Freq: Every day | ORAL | 2 refills | Status: DC

## 2024-09-13 MED ORDER — METOPROLOL SUCCINATE ER 25 MG PO TB24: 25.0000 mg | ORAL_TABLET | Freq: Every day | ORAL | 3 refills | Status: AC

## 2024-09-13 NOTE — Patient Instructions (Addendum)
 Good to see you today I will be in touch with your labs and urine results Let's have you try Myrbetriq  for your overactive bladder if insurance will cover it! Let me know what you find out   Let's try adding toprol  xl 25 mg daily to your BP regimen
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
## 2024-09-14 ENCOUNTER — Encounter: Payer: Self-pay | Admitting: Family Medicine

## 2024-09-14 LAB — CBC
HCT: 40.6 % (ref 36.0–46.0)
Hemoglobin: 13.7 g/dL (ref 12.0–15.0)
MCHC: 33.7 g/dL (ref 30.0–36.0)
MCV: 86.9 fl (ref 78.0–100.0)
Platelets: 299 K/uL (ref 150.0–400.0)
RBC: 4.67 Mil/uL (ref 3.87–5.11)
RDW: 14.4 % (ref 11.5–15.5)
WBC: 7.5 K/uL (ref 4.0–10.5)

## 2024-09-14 LAB — BASIC METABOLIC PANEL WITH GFR
BUN: 11 mg/dL (ref 6–23)
CO2: 29 meq/L (ref 19–32)
Calcium: 10 mg/dL (ref 8.4–10.5)
Chloride: 102 meq/L (ref 96–112)
Creatinine, Ser: 0.89 mg/dL (ref 0.40–1.20)
GFR: 66.6 mL/min
Glucose, Bld: 99 mg/dL (ref 70–99)
Potassium: 4.8 meq/L (ref 3.5–5.1)
Sodium: 140 meq/L (ref 135–145)

## 2024-09-15 ENCOUNTER — Encounter: Payer: Self-pay | Admitting: Neurology

## 2024-09-15 DIAGNOSIS — G40309 Generalized idiopathic epilepsy and epileptic syndromes, not intractable, without status epilepticus: Secondary | ICD-10-CM

## 2024-09-15 LAB — URINE CULTURE
MICRO NUMBER:: 17502723
Result:: NO GROWTH
SPECIMEN QUALITY:: ADEQUATE

## 2024-09-16 ENCOUNTER — Encounter: Payer: Self-pay | Admitting: Family Medicine

## 2024-09-16 MED ORDER — LEVETIRACETAM 500 MG PO TABS: ORAL_TABLET | ORAL | 3 refills | Status: AC

## 2024-09-27 ENCOUNTER — Encounter: Payer: Self-pay | Admitting: Family Medicine

## 2024-09-27 DIAGNOSIS — N3281 Overactive bladder: Secondary | ICD-10-CM

## 2024-09-27 MED ORDER — MIRABEGRON ER 25 MG PO TB24: 25.0000 mg | ORAL_TABLET | Freq: Every day | ORAL | 3 refills | Status: AC

## 2024-10-04 ENCOUNTER — Ambulatory Visit: Admitting: Psychiatry

## 2024-10-23 ENCOUNTER — Ambulatory Visit: Payer: Medicare Other

## 2024-11-01 ENCOUNTER — Ambulatory Visit: Admitting: Psychiatry

## 2024-11-08 ENCOUNTER — Ambulatory Visit: Admitting: Psychiatry

## 2024-11-14 ENCOUNTER — Ambulatory Visit: Admitting: Neurology

## 2024-11-19 ENCOUNTER — Ambulatory Visit: Admitting: Neurology
